# Patient Record
Sex: Female | Born: 1945
Health system: Southern US, Community
[De-identification: ages and names within clinical notes are randomized; demographics above are authoritative.]

## PROBLEM LIST (undated history)

## (undated) DIAGNOSIS — M8580 Other specified disorders of bone density and structure, unspecified site: Secondary | ICD-10-CM

## (undated) DIAGNOSIS — R05 Cough: Secondary | ICD-10-CM

## (undated) DIAGNOSIS — R011 Cardiac murmur, unspecified: Secondary | ICD-10-CM

## (undated) DIAGNOSIS — Z8639 Personal history of other endocrine, nutritional and metabolic disease: Secondary | ICD-10-CM

## (undated) DIAGNOSIS — R6883 Chills (without fever): Secondary | ICD-10-CM

## (undated) DIAGNOSIS — E785 Hyperlipidemia, unspecified: Secondary | ICD-10-CM

## (undated) DIAGNOSIS — K760 Fatty (change of) liver, not elsewhere classified: Secondary | ICD-10-CM

## (undated) DIAGNOSIS — D6861 Antiphospholipid syndrome: Principal | ICD-10-CM

## (undated) DIAGNOSIS — T4145XA Adverse effect of unspecified anesthetic, initial encounter: Secondary | ICD-10-CM

## (undated) DIAGNOSIS — I10 Essential (primary) hypertension: Secondary | ICD-10-CM

## (undated) DIAGNOSIS — I82409 Acute embolism and thrombosis of unspecified deep veins of unspecified lower extremity: Secondary | ICD-10-CM

## (undated) DIAGNOSIS — Z9889 Other specified postprocedural states: Secondary | ICD-10-CM

## (undated) DIAGNOSIS — R42 Dizziness and giddiness: Secondary | ICD-10-CM

## (undated) DIAGNOSIS — M1990 Unspecified osteoarthritis, unspecified site: Secondary | ICD-10-CM

## (undated) DIAGNOSIS — Z5189 Encounter for other specified aftercare: Secondary | ICD-10-CM

## (undated) DIAGNOSIS — R112 Nausea with vomiting, unspecified: Secondary | ICD-10-CM

## (undated) DIAGNOSIS — R059 Cough, unspecified: Secondary | ICD-10-CM

## (undated) DIAGNOSIS — T8859XA Other complications of anesthesia, initial encounter: Secondary | ICD-10-CM

## (undated) DIAGNOSIS — U071 COVID-19: Secondary | ICD-10-CM

## (undated) DIAGNOSIS — K219 Gastro-esophageal reflux disease without esophagitis: Secondary | ICD-10-CM

## (undated) DIAGNOSIS — J40 Bronchitis, not specified as acute or chronic: Secondary | ICD-10-CM

## (undated) DIAGNOSIS — E871 Hypo-osmolality and hyponatremia: Secondary | ICD-10-CM

## (undated) DIAGNOSIS — K449 Diaphragmatic hernia without obstruction or gangrene: Secondary | ICD-10-CM

## (undated) HISTORY — DX: Personal history of other endocrine, nutritional and metabolic disease: Z86.39

## (undated) HISTORY — DX: Hypo-osmolality and hyponatremia: E87.1

## (undated) HISTORY — DX: Diaphragmatic hernia without obstruction or gangrene: K44.9

## (undated) HISTORY — DX: Gastro-esophageal reflux disease without esophagitis: K21.9

## (undated) HISTORY — PX: REPAIR PERONEAL TENDONS ANKLE: SUR1201

## (undated) HISTORY — PX: CARPAL TUNNEL RELEASE: SHX101

## (undated) HISTORY — DX: Hyperlipidemia, unspecified: E78.5

## (undated) HISTORY — DX: Encounter for other specified aftercare: Z51.89

## (undated) HISTORY — DX: Chills (without fever): R68.83

## (undated) HISTORY — DX: Acute embolism and thrombosis of unspecified deep veins of unspecified lower extremity: I82.409

## (undated) HISTORY — DX: Cough: R05

## (undated) HISTORY — PX: TUBAL LIGATION: SHX77

## (undated) HISTORY — PX: ABDOMINAL HYSTERECTOMY: SHX81

## (undated) HISTORY — PX: KNEE SURGERY: SHX244

## (undated) HISTORY — DX: Cardiac murmur, unspecified: R01.1

## (undated) HISTORY — DX: Unspecified osteoarthritis, unspecified site: M19.90

## (undated) HISTORY — DX: Dizziness and giddiness: R42

## (undated) HISTORY — DX: Fatty (change of) liver, not elsewhere classified: K76.0

## (undated) HISTORY — PX: ACHILLES TENDON SURGERY: SHX542

## (undated) HISTORY — PX: TUMOR REMOVAL: SHX12

## (undated) HISTORY — PX: DILATION AND CURETTAGE OF UTERUS: SHX78

## (undated) HISTORY — DX: Other specified disorders of bone density and structure, unspecified site: M85.80

## (undated) HISTORY — DX: Essential (primary) hypertension: I10

## (undated) HISTORY — DX: Cough, unspecified: R05.9

## (undated) HISTORY — DX: Antiphospholipid syndrome: D68.61

---

## 1967-08-14 HISTORY — PX: CHOLECYSTECTOMY: SHX55

## 1999-10-17 ENCOUNTER — Other Ambulatory Visit: Admission: RE | Admit: 1999-10-17 | Discharge: 1999-10-17 | Payer: Self-pay | Admitting: Obstetrics and Gynecology

## 2000-05-10 ENCOUNTER — Encounter: Payer: Self-pay | Admitting: *Deleted

## 2000-05-10 ENCOUNTER — Encounter: Admission: RE | Admit: 2000-05-10 | Discharge: 2000-05-10 | Payer: Self-pay | Admitting: *Deleted

## 2000-10-17 ENCOUNTER — Encounter: Payer: Self-pay | Admitting: Orthopedic Surgery

## 2000-10-17 ENCOUNTER — Encounter: Admission: RE | Admit: 2000-10-17 | Discharge: 2000-10-17 | Payer: Self-pay | Admitting: Orthopedic Surgery

## 2001-12-05 ENCOUNTER — Encounter: Payer: Self-pay | Admitting: Obstetrics and Gynecology

## 2001-12-05 ENCOUNTER — Ambulatory Visit (HOSPITAL_COMMUNITY): Admission: RE | Admit: 2001-12-05 | Discharge: 2001-12-05 | Payer: Self-pay | Admitting: Obstetrics and Gynecology

## 2001-12-22 ENCOUNTER — Encounter: Payer: Self-pay | Admitting: Obstetrics and Gynecology

## 2001-12-24 ENCOUNTER — Ambulatory Visit (HOSPITAL_COMMUNITY): Admission: RE | Admit: 2001-12-24 | Discharge: 2001-12-24 | Payer: Self-pay | Admitting: Obstetrics and Gynecology

## 2002-05-15 ENCOUNTER — Encounter: Payer: Self-pay | Admitting: Orthopedic Surgery

## 2002-05-15 ENCOUNTER — Encounter: Admission: RE | Admit: 2002-05-15 | Discharge: 2002-05-15 | Payer: Self-pay | Admitting: Orthopedic Surgery

## 2002-08-13 HISTORY — PX: JOINT REPLACEMENT: SHX530

## 2003-01-25 ENCOUNTER — Encounter: Payer: Self-pay | Admitting: Orthopedic Surgery

## 2003-01-27 ENCOUNTER — Inpatient Hospital Stay (HOSPITAL_COMMUNITY): Admission: RE | Admit: 2003-01-27 | Discharge: 2003-01-31 | Payer: Self-pay | Admitting: Orthopedic Surgery

## 2003-01-27 ENCOUNTER — Encounter: Payer: Self-pay | Admitting: Orthopedic Surgery

## 2005-04-20 ENCOUNTER — Encounter: Admission: RE | Admit: 2005-04-20 | Discharge: 2005-07-19 | Payer: Self-pay | Admitting: Family Medicine

## 2005-04-29 ENCOUNTER — Encounter: Admission: RE | Admit: 2005-04-29 | Discharge: 2005-04-29 | Payer: Self-pay | Admitting: Orthopedic Surgery

## 2005-06-07 ENCOUNTER — Encounter: Admission: RE | Admit: 2005-06-07 | Discharge: 2005-08-15 | Payer: Self-pay | Admitting: Neurosurgery

## 2007-04-25 ENCOUNTER — Ambulatory Visit (HOSPITAL_BASED_OUTPATIENT_CLINIC_OR_DEPARTMENT_OTHER): Admission: RE | Admit: 2007-04-25 | Discharge: 2007-04-25 | Payer: Self-pay | Admitting: Orthopedic Surgery

## 2007-06-03 ENCOUNTER — Encounter: Payer: Self-pay | Admitting: Pulmonary Disease

## 2007-09-24 ENCOUNTER — Ambulatory Visit: Payer: Self-pay | Admitting: Pulmonary Disease

## 2007-09-24 DIAGNOSIS — I10 Essential (primary) hypertension: Secondary | ICD-10-CM | POA: Insufficient documentation

## 2007-10-07 ENCOUNTER — Ambulatory Visit: Payer: Self-pay | Admitting: Pulmonary Disease

## 2007-11-07 LAB — HM COLONOSCOPY

## 2008-03-19 ENCOUNTER — Encounter: Admission: RE | Admit: 2008-03-19 | Discharge: 2008-03-19 | Payer: Self-pay | Admitting: Orthopedic Surgery

## 2009-12-23 ENCOUNTER — Ambulatory Visit: Payer: Self-pay | Admitting: Diagnostic Radiology

## 2009-12-23 ENCOUNTER — Emergency Department (HOSPITAL_BASED_OUTPATIENT_CLINIC_OR_DEPARTMENT_OTHER): Admission: EM | Admit: 2009-12-23 | Discharge: 2009-12-23 | Payer: Self-pay | Admitting: Emergency Medicine

## 2010-02-14 ENCOUNTER — Ambulatory Visit (HOSPITAL_COMMUNITY): Admission: RE | Admit: 2010-02-14 | Discharge: 2010-02-14 | Payer: Self-pay | Admitting: Gastroenterology

## 2010-02-20 ENCOUNTER — Ambulatory Visit (HOSPITAL_COMMUNITY): Admission: RE | Admit: 2010-02-20 | Discharge: 2010-02-20 | Payer: Self-pay | Admitting: Gastroenterology

## 2010-02-22 ENCOUNTER — Ambulatory Visit (HOSPITAL_COMMUNITY): Admission: RE | Admit: 2010-02-22 | Discharge: 2010-02-22 | Payer: Self-pay | Admitting: Gastroenterology

## 2010-03-07 ENCOUNTER — Ambulatory Visit (HOSPITAL_COMMUNITY): Admission: RE | Admit: 2010-03-07 | Discharge: 2010-03-07 | Payer: Self-pay | Admitting: Gastroenterology

## 2010-06-28 ENCOUNTER — Encounter: Admission: RE | Admit: 2010-06-28 | Discharge: 2010-06-28 | Payer: Self-pay | Admitting: Family Medicine

## 2010-09-03 ENCOUNTER — Encounter: Payer: Self-pay | Admitting: Orthopedic Surgery

## 2010-10-31 LAB — URINALYSIS, ROUTINE W REFLEX MICROSCOPIC
Bilirubin Urine: NEGATIVE
Ketones, ur: NEGATIVE mg/dL
Nitrite: NEGATIVE
Protein, ur: NEGATIVE mg/dL
Urobilinogen, UA: 0.2 mg/dL (ref 0.0–1.0)

## 2010-10-31 LAB — URINE CULTURE

## 2010-12-26 NOTE — Op Note (Signed)
NAMESYDNI, [name]               ACCOUNT NO.:  [id number]   MEDICAL RECORD NO.:  [id number]          PATIENT TYPE:  AMB   LOCATION:  [place]                          FACILITY:  [facility]   PHYSICIAN:  Youlanda Mighty. Sypher, M.D. DATE OF BIRTH:  [birth year]   DATE OF PROCEDURE:  04/25/2007  DATE OF DISCHARGE:                               OPERATIVE REPORT   PREOPERATIVE DIAGNOSES:  Tenosynovitis right long finger.   POSTOPERATIVE DIAGNOSES:  Tenosynovitis right long finger.   OPERATION:  Release of right long finger A1 pulley.   OPERATIONS:  Karen Sato, MD.   ASSISTANT:  Karen Humbles, PA-C.   ANESTHESIA:  Local 2% lidocaine block of right long finger and flexor  sheath supplemented by IV sedation.   SUPERVISING ANESTHESIOLOGIST:  Dr. Ola Spurr.   INDICATIONS:  Karen Gonzales is a 65 year old woman referred for  evaluation and management of locking of her right long finger at the A1  pulley.  Due to failed response and nonoperative measures, she is  brought to the operating room at this time for release of right long  finger A1 pulley.   DESCRIPTION OF PROCEDURE:  [name] is brought to the operating  room and placed in supine position on the table.   Following light sedation, the right arm was prepped with Betadine soap  solution and sterilely draped.  2% lidocaine was infiltrated into the  path of the intended incision in the palm and into the right long finger  flexor sheath.   When anesthesia was satisfactory, the right arm was exsanguinated with  an Esmarch bandage and an arterial tourniquet on the proximal brachium  inflated to 250 mmHg.   The procedure commenced with a short oblique incision in the distal  palmar crease.  The subcutaneous tissue was carefully divided revealing  the palmar fascia.  This was released followed by identification of the  A1 pulley.  The A1 pulley was clearly noted to be covered in fibrinous  exudate.  This was clear to the [facility] elevator  followed by release of  the pulley along its radial border with tenotomy scissors.   Thereafter, [name] demonstrated full active range of motion,  closing the fingertip to the palm without triggering.   The wound was then repaired with interrupted suture of 5-0 nylon.   There were no apparent complications.   Her wound was dressed with Xeroflo, sterile gauze and Coban.   We will see her back for follow-up in 1 week for suture removal.   For aftercare she is provided a prescription of Darvocet-N 100 one  tablet p.o. q. 4-6 hours p.r.n. pain, 20 tablets without refill.      Youlanda Mighty Sypher, M.D.  Electronically Signed     RVS/MEDQ  D:  04/25/2007  T:  04/25/2007  Job:  [id number]

## 2010-12-29 NOTE — H&P (Signed)
NAME:  Karen Gonzales, Karen Gonzales NO.:  [id number]   MEDICAL RECORD NO.:  [id number]                   PATIENT TYPE:  INP   LOCATION:                                       FACILITY:  [place]   PHYSICIAN:  Lockie Pares, M.D.                 DATE OF BIRTH:  06-10-1946   DATE OF ADMISSION:  01/27/2003  DATE OF DISCHARGE:  01/31/2003                                HISTORY & PHYSICAL   CHIEF COMPLAINT:  Progressive left knee pain for the last three to four  years.   HISTORY OF PRESENT ILLNESS:  This 65 year old white female patient presented  to Dr. French Ana with a history of intermittent knee problems over the last  three to four years. She has a history of a right knee arthroscopy by him in  January of 1994 and then again in May of 1999. The left knee started  bothering her about 1996, and she subsequently has had a left knee  arthroscopy with a lateral release in August of 1996 and then two left knee  arthroscopies, one on September of 2002 and then one again in November of  2003. She has the gradual onset of progressively worsening left knee pain,  and it has not been getting any better. No other injury to her knee.   The pain at this point is constant in her left knee and is described as a  sharp throbbing sensation diffuse about the joint line with occasional  radiation approximately into her hip and distally into her calf. The pain  increases with any prolonged sitting or standing and then decreases with the  use of ice. She has been given some Percocet to use at night time, and she  takes half a tablet at night just to help her sleep. The knee pops, grinds,  gives way, and swells at times. It does keep her up at night. She has had  cortisone injections and Synvisc injections in the past with little relief.  She is not currently ambulating with any assistive devices.   ALLERGIES:  No known drug allergies.   CURRENT MEDICATIONS:  1. Detrol LA  mg one tablet  p.o. q.a.m.  2. Gynodiol 1.5 mg one tablet p.o. q.a.m.  3. Celebrex 200 mg one tablet p.o. q.a.m.  4. Clarinex 5 mg one tablet p.o. q.a.m.  5. Percocet 5/325 mg half tablet p.o. q.h.s. p.r.n. pain.   PAST MEDICAL HISTORY:  1. Benign heart murmur.  2. Seasonal allergies.  3. Urinary frequency and urgency treated by Dr. Rosana Hoes.   She denies any history of diabetes mellitus, hypertension, thyroid disease,  hiatal hernia, reflux, peptic ulcer disease, heart disease, asthma, or any  other chronic medical condition other than noted previously.   PAST SURGICAL HISTORY:  1. Open cholecystectomy 1968.  2. Right carpal tunnel release and right knee arthroscopy August 31, 1992  by Dr. Vangie Bicker.  3. Excision of a bone spur on her right pelvis in the 1970s.  4. Excision right heel pump bump by Dr. Amalia Hailey.  5. Left knee arthroscopy with lateral release April 05, 1995.  6. Right knee arthroscopy Dec 17, 1997.  7. Left carpal tunnel release September 16, 1996 by Dr. Viona Gilmore. Earlie Server.  8. Left knee arthroscopy May 07, 2001 by Dr. Viona Gilmore. Earlie Server.  9. Left knee arthroscopy June 24, 2002 by Dr. Viona Gilmore. Earlie Server.  10.      Laparoscopy for scar tissue around her ovaries by Dr. Willis Modena in     2003.  11.      Partial hysterectomy by Dr. Joneen Caraway in the 1980s.   She denies any complications from the above mentioned procedures.   SOCIAL HISTORY:  She denies any history of cigarette smoking, alcohol use,  or drug use. She is married and has three children. She lives with her  husband and her father in a one-story house with two steps into the main  entrance. 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 Her medical doctor is Dr. Hulan Fess in [facility], and he has cleared her for surgery.   FAMILY HISTORY:  Her mother died at the age of 23 with hypertension and  kidney failure. Her father is alive at age  64 with BPH and kidney stones.  She has two brothers, one age 68 with diabetes and emphysema, one age 62 who  is alive and well. Her two sons are age 15 and 37, the youngest with kidney  stones, the oldest with high cholesterol; and her daughter is 71 and just  has knee problems.   REVIEW OF SYSTEMS:  She does have a full plate of dentures on the upper jaw  line. She wears contact lenses. She does have occasional vertigo which she  treats with meclizine. She is currently having some sinus congestion and  occasional epistaxis, and she is using the Clarinex and the nasal spray. She  does have a history of acute bronchitis in the past, none recently. She has  frequent urinary frequency and urgency which is treated with the Detrol. She  does have some arthritic complaints in her hands. She does not have a living  will or power of attorney. All other systems are negative and  noncontributory.   PHYSICAL EXAMINATION:  GENERAL:  Well-developed, well-nourished, mildly  overweight white female in no acute distress. Walks with a slight limp. Mood  and affect are appropriate. Talks easily with examiner. Height 5 feet 5  inches, weight 178 pounds. BMI is 28.5.  VITAL SIGNS:  Temperature 96.6 degrees Fahrenheit, pulse 72, respirations  14, and BP 150/82.  HEENT:  Normocephalic, atraumatic without frontal or maxillary sinus  tenderness to palpation. Conjunctivae pink. Sclerae are anicteric. PERRLA.  EOMs intact. No visible external ear deformities. Hearing grossly intact.  Tympanic membranes pearly gray bilaterally with good light reflex. Nose and  nasal septal midline. Nasal mucosa pink and moist without exudates or polyps  noted. Buccal mucosa pink and moist. Dentures in place on the upper jaw  line. The rest of her dentition in good repair. Pharynx without erythema or exudate. Tongue and uvula midline. Tongue without fasciculations, and uvula  rises equally with phonation.  NECK:  No visible  masses or lesions noted. Trachea midline. No palpable  lymphadenopathy nor thyromegaly. Carotids +2 bilaterally without bruits.  Full range of motion. Nontender to palpation along the cervical spine.  CARDIOVASCULAR:  Heart rate and rhythm regular. S1 and S2 present without  rubs, clicks, or murmurs noted.  RESPIRATORY:  Respirations even and unlabored. Breath sounds clear to  auscultation bilaterally without rales or wheezes noted.  ABDOMEN:  Rounded abdominal contour. Bowel sounds present x4 quadrants.  Soft, nontender to palpation without hepatosplenomegaly or CVA tenderness.  Femoral pulses +2 bilaterally. Nontender to palpation along the entire  length of the vertebral column.  BREAST/GU/RECTAL/PELVIC:  These exams deferred at this time.  MUSCULOSKELETAL:  No obvious deformities bilateral upper extremities with  full range of motion of these extremities without pain. Radial pulses +2  bilaterally. She has full range of motion of her hips, ankles, and toes  bilaterally. DP and PT pulses are +2. Mild +1 pitting edema bilateral lower  extremities. No calf pain with palpation and negative Homans sign  bilaterally. Right knee has full extension and flexion to 135 degrees with  minimal crepitus. She does have some pain with palpation along the lateral  joint line medially. No effusion. She is stable to varus and valgus stress  and negative anterior drawer. She does have scattered spider veins noted  about both lower extremities. Left knee has full extension and flexion only  to 115 degrees. There is a moderate amount of crepitus with range of motion.  She is painful with palpation on both the medial and lateral joint line, but  medial seems to be more painful than lateral. There is a +2 effusion in the  knee and again spider veins noted scattered throughout the leg. She is  stable to varus and valgus stress and negative anterior drawer.  NEUROLOGICAL:  Alert and oriented x3. Cranial nerves  II-XII are grossly  intact. Strength 5/5 bilateral upper and lower extremities. Rapid  alternating movements intact. Deep tendon reflexes 2+ bilateral upper and  lower extremities.   RADIOLOGIC FINDINGS:  Arthroscopic pictures and Dr. Alvester Morin interpretation  shows tricompartmental osteoarthritis on the posterolateral plane. She did  have a meniscus and has generalized grade III chondral changes in her knee  with more advanced changes noted laterally.   IMPRESSION:  1. End-stage osteoarthritis, left knee.  2. Benign heart murmur.  3. Seasonal allergies.  4. Bladder frequency and urgency.   PLAN:  Ms. Seltzer will be admitted to [facility] on January 27, 2003  where she will undergo a left total knee arthroplasty by Dr. Vangie Bicker. She will undergo the routine preoperative laboratory tests and studies prior to this procedure. If we have any medical issues while she is  hospitalized, we will consult [facility] hospitalist.     Vonita Moss. Reggy Eye, M.D.    KED/MEDQ  D:  01/14/2003  T:  01/14/2003  Job:  [id number]

## 2010-12-29 NOTE — Op Note (Signed)
NAME:  Karen Gonzales, Karen Gonzales                         ACCOUNT NO.:  [id number]   MEDICAL RECORD NO.:  [id number]                   PATIENT TYPE:  INP   LOCATION:  2899                                 FACILITY:  [place]   PHYSICIAN:  Yvette Rack., M.D.             DATE OF BIRTH:  1945-08-30   DATE OF PROCEDURE:  01/27/2003  DATE OF DISCHARGE:                                 OPERATIVE REPORT   PREOPERATIVE DIAGNOSIS:  Osteoarthritis with varus deformity, left knee.   POSTOPERATIVE DIAGNOSIS:  Osteoarthritis with varus deformity, left knee.   OPERATION PERFORMED:  Left total knee replacement (LCS standard femur,  patella, size 3 tibia with 17.5 mm bearing).   SURGEON:  Lockie Pares, M.D.   ASSISTANT:  Thomasenia Bottoms, P.A.-C.   TOURNIQUET TIME:  One hour, 30 minutes.   DESCRIPTION OF PROCEDURE:  Sterile prep and drape.  Exsanguination of leg.  Inflation of tourniquet to 375 mmHg.  Straight skin incision with medial  parapatellar approach to the made.  A tibial cut after placement of a tibial  cut jig about 2 degrees below the most diseased medial compartment.  This  was followed by cutting the anterior-posterior femoral cut and balancing of  the knee which required actually eventually going up to a 17.5 mm bearing  with stripping of the medial side of the knee.   Distal femoral cut was accomplished 4 degrees valgus, followed by anterior  and posterior chamfers.  __________  for the prosthesis.  The tibia was next  delivered, anteriorly.  The PCL was released.  Excess remnants of the  menisci were removed.  A size 3 tibia was placed at the keel type tibia size  3 as mentioned.  Trial reduction carried out on femur tibia.  Patella was  cut leaving 14 mm of ___________  patella.  Trial reduction showed mild,  medial laxity and lateral laxity with a 15 bearing, therefore it was upped  to a 17.5 mm bearing.  After this, the components were removed.  The final  components cemented in with  the exception of the bearing.  The bearing again  judged to be acceptable at 17.5 mm.  The tourniquet was released and checked  for bleeding and excess cement along the posterior aspect of the femur which  was not noted.  Again final reduction was carried out with a final bearing  of the above mentioned sizes.  Closure was effected.  Small bleeders were  coagulated.  Closure was effected with #1 Ethibond, 2-0 Vicryl and skin  clips and then anesthesia was coming in to do a femoral nerve block.                                                Lockie Pares  Brooke Bonito., M.D.    WDC/MEDQ  D:  01/27/2003  T:  01/27/2003  Job:  [id number]

## 2010-12-29 NOTE — Discharge Summary (Signed)
NAME:  Karen Gonzales, Karen Gonzales                         ACCOUNT NO.:  [id number]   MEDICAL RECORD NO.:  [id number]                   PATIENT TYPE:  INP   LOCATION:  5027                                 FACILITY:  [place]   PHYSICIAN:  Lockie Pares, M.D.                 DATE OF BIRTH:  12/24/1945   DATE OF ADMISSION:  01/27/2003  DATE OF DISCHARGE:  01/31/2003                                 DISCHARGE SUMMARY   ADMISSION DIAGNOSES:  1. End-stage osteoarthritis, left knee.  2. Heart murmur.  3. Seasonal allergies.  4. Urinary frequency and urgency.   DISCHARGE DIAGNOSES:  1. Left total knee arthroplasty.  2. Postoperative blood loss anemia.  3. Heart murmur.  4. Seasonal allergies.  5. Urinary frequency and urgency.   HISTORY OF PRESENT ILLNESS:  The patient is a 65 year old, white female, who  presents with a 3-4 year history of gradual onset but progressively  worsening left knee pain.  She denies any specific injury.  She has history  of left knee arthroscopy in 1996, 2002, and 2003.  The pain is currently  constant, sharp, throbbing sensation diffuse about the knee with radiation  up into the hip and down into the calf at times with prolonged sitting and  standing.  It improves with elevation and ice.  She is currently using  Percocet for her pain.  She does have night pain, popping, grinding, and  giving-away.  It does swell.   ALLERGIES:  No known drug allergies.   CURRENT MEDICATIONS:  1. Detrol LA 4 mg p.o. q.a.m.  2. ________ 1.5 mg p.o. q.a.m.  3. Celebrex 200 mg p.o. q.a.m.  4. Clarinex 5 mg p.o. q.a.m.  5. Percocet p.r.n.   SURGICAL PROCEDURE:  On January 27, 2003, patient was taken to the OR by Lockie Pares, M.D., assisted by Erskine Emery, P.A.-C., under general anesthesia.  The patient underwent a left total knee replacement with an LCS standard  femur and patella and a size 3 tibial tray with a 17.5 polyethylene bearing.  The patient tolerated the procedure well.   There were no complications.  The  components were cemented in, and patient received a postoperative femoral  nerve block prior to leaving the operative suite.  The patient was  discharged to the recovery room in good condition.   CONSULTS:  The following routine consults were requested:  Physical therapy,  occupational therapy, case management.   HOSPITAL COURSE:  On January 27, 2003, patient was admitted to [facility] under the care of Lockie Pares, M.D.  The patient was taken to the  OR where a left total knee arthroplasty was performed without any  complications.  The patient received a postoperative femoral nerve block to  assist in pain control, and the patient was transferred to the recovery room  and then to the orthopedic floor in good condition.  The patient was placed  on Coumadin for routine DVT prophylaxis for a 30 day  protocol to be  monitored as an outpatient by [place].   The patient then incurred a total of four days of postoperative care on the  orthopedic floor in which the patient did develop some postoperative blood  loss anemia.  Her hemoglobin dropped to 8.9 with her heart rate elevating to  around 100.  Her blood pressure with the systolics in the 086'V/78.  Patient  did have some lightheadedness and dizziness, so she was typed and crossed  and transfused two units of packed red blood cells with good expected  improvement of her hemoglobin to 10.2 and no other untoward events.  The  patient did have some nausea on postop day #1, but this resolved on its own  with time; otherwise, she had no other problems during her hospitalization.   The patient did have an occasional expected low-grade temp. but otherwise  remained afebrile.  Her wound remained benign for any signs of infection.  Her leg remained neuro-motor-vascularly intact.  The patient worked well  with the [place].  She was able to achieve 0-90 degrees without difficulty.  She  worked well with  physical therapy, was able to achieve her goals in order to  be discharged home by postop day #4, so she was discharged home with routine  home health physical therapy per protocol.   LABORATORY DATA:  CBC on June 19:  WBC 7.4, hemoglobin 10.2, hematocrit  29.7, platelets 165.   EKG on admission was normal sinus rhythm with 84 beats per minute.   INR on June 20 was 1.5.  Routine chemistries on June 18:  Sodium of 134 up  from a low of 131, potassium 3.9, glucose 118 down from a high of 134, BUN  5, creatinine 0.5.  Routine urinalysis on admission was normal.  The patient  received a total of two units of packed red blood cells during her  hospitalization.   MEDICATIONS UPON DISCHARGE FROM ORTHOPEDIC FLOOR:  1. Colace 100 mg p.o. b.i.d.  2. Senokot 1 tab p.o. b.i.d.  3. Laxative or enema of choice p.r.n.  4. Reglan 10 mg p.o. q.8h. p.r.n.  5. Percocet 1-2 tabs q.4-6h. p.r.n.  6. Tylenol 650 mg p.o. q.6h. p.r.n.  7. Robaxin 500 mg p.o. q.6h. p.r.n.  8. Restoril 15 mg p.o. q.h.s. p.r.n.  9. Heparin 3000 units subcu q.12h. until Coumadin therapeutic.  10.      Claritin 10 mg p.o. daily.  11.      Vioxx 12.5 mg p.o. daily.  12.      Detrol 4 mg p.o. daily.  13.      Estradiol 1 mg p.o. daily.  14.      Coumadin 7.5 mg p.o. daily.  15.      The patient is to resume routine home medications of Percocet 5 mg     1-2 tabs q.4-6h. for pain p.r.n.  16.      Coumadin 5 mg 1 tab or 1/2 tab 7.5 mg daily unless changed by     [place].   ACTIVITY:  The patient is to maintain 50% of weightbearing on left leg with  the use of a walker.   DIET:  No restrictions.   WOUND CARE:  Patient is to keep wound clean and dry; to check daily for any  signs of infection.  If any questions, call Lockie Pares, M.D.'s office for advice.  FOLLOW-UP:  The patient is to call [phone] for a follow-up in one week with  Lockie Pares, M.D.  PATIENT'S CONDITION UPON DISCHARGE TO HOME:  Improved and  good.     Evert Kohl, Alisa Graff, M.D.    RWK/MEDQ  D:  02/18/2003  T:  02/19/2003  Job:  [id number]   cc:   Lennette Bihari L. Little, M.D.  [address]  [place]  [place] [address]  Fax: [phone]    cc:   Priscille Heidelberg. Little, M.D.  [address]  [place]  [place] [address]  Fax: [phone]

## 2010-12-29 NOTE — H&P (Signed)
[facility]  Patient:    Karen Gonzales, Karen Gonzales Visit Number: [id number] MRN: [id number]          Service Type: DSU Location: DAY Attending Physician:  Arta Silence Dictated by:   Clarene Duke, M.D. Admit Date:  12/24/2001                           History and Physical  CHIEF COMPLAINT:  Left pelvic pain.  HISTORY OF PRESENT ILLNESS:  This is a 65 year old white female, para 3-0-0-3, who was seen for her annual exam in April of this year.  She complained of left lower quadrant pain off and on without specific aggravating or relieving factors.  Some days the pain is worse than others, but no specific associated factors.  She had no significant findings at exam, except her left adnexa was slightly tender.  Due to the fact that the patient is gradually getting, the patient wished to proceed with laparoscopic surgery to investigate the cause of this pain.  PAST OBSTETRICAL HISTORY:  Three vaginal deliveries without complications.  PAST MEDICAL HISTORY: 1. Knee arthritis. 2. Detrusor instability.  PAST SURGICAL HISTORY: 1. Total abdominal hysterectomy. 2. Prior tubal ligation. 3. Cholecystectomy. 4. Knee surgery x 4. 5. Pelvic bone surgery. 6. Surgery for carpal tunnel syndrome.  ALLERGIES:  None known.  CURRENT MEDICATIONS: 1. Detrol LA 4 mg a day. 2. Estradiol 1.5 mg a day. 3. Clarinex p.r.n.  SOCIAL HISTORY:  The patient is married and denies alcohol, tobacco, or drug use.  REVIEW OF SYSTEMS:  Otherwise negative.  FAMILY HISTORY:  Paternal grandmother and paternal aunt with breast cancer.  PHYSICAL EXAMINATION:  Generally this is a well-developed, well-nourished, white female who is in no acute distress.  WEIGHT:  184 pounds.  VITAL SIGNS:  The blood pressure is 130/90.  NECK:  Supple without lymphadenopathy or thyromegaly.  LUNGS:  Clear to auscultation.  HEART:  Regular rate and rhythm without murmur.  BREASTS:   Examined in the sitting and supine position reveal no dominant masses, adenopathy, skin change, or nipple discharge.  ABDOMEN:  Normal with right upper quadrant and transverse scars.  No palpable masses or tenderness.  PELVIC:  The external genitalia are within normal limits.  The speculum exam reveals a normal healed vaginal cuff.  On bimanual exam, she has no adnexal masses, but is slightly tender on the left.  This is confirmed by rectovaginal exam.  ASSESSMENT:  Left pelvic pain, status post total abdominal hysterectomy.  The pain is gradually getting worse and the patient wishes to proceed with surgical therapy.  The risks of the surgery, including bleeding, infection, and damage to surrounding organs have been discussed with the patient.  PLAN:  Admit the patient for diagnostic laparoscopy with possible lysis of adhesions. Dictated by:   Clarene Duke, M.D. Attending Physician:  Arta Silence DD:  12/23/01 TD:  12/24/01 Job: [id number]

## 2010-12-29 NOTE — Op Note (Signed)
[facility]  Patient:    Karen Gonzales, Karen Gonzales Visit Number: [id number] MRN: [id number]          Service Type: DSU Location: DAY Attending Physician:  Arta Silence Dictated by:   Clarene Duke, M.D. Proc. Date: 12/24/01 Admit Date:  12/24/2001                             Operative Report  PREOPERATIVE DIAGNOSIS:  Pelvic pain.  POSTOPERATIVE DIAGNOSIS:  Pelvic pain and pelvic adhesions.  PROCEDURE:  Laparoscopic adhesiolysis.  SURGEON:  Clarene Duke, M.D.  ANESTHESIA:  General endotracheal tube.  ESTIMATED BLOOD LOSS:  Less than 50 cc.  FINDINGS:  Normal tubes and ovaries except that they were both adherent laterally.  The uterus was absent from prior hysterectomy.  Omentum was adherent to most of the pelvis and the left inguinal canal.  It was also adherent beneath the umbilicus and just inferior to the liver to the abdominal pain from prior cholecystectomy.  DESCRIPTION OF PROCEDURE:  The patient was taken to the operating room and placed in the dorsal supine position.  General anesthesia was induced and she was placed in mobile stirrups.  The abdomen was prepped and draped in the usual sterile fashion for a laparoscopic procedure.  The bladder was drained with a red rubber catheter and a sponge stick was placed at the vaginal cuff for manipulation.  Her infraumbilical skin was infiltrated with 0.25% Marcaine and a 1.5 cm horizontal incision was made in her previous scar.  The Veress needle was inserted into the peritoneum cavity and placement confirmed by a water drop test and an opening pressure of 4 mmHg.  Approximately 3 L of CO2 gas was insufflated and the Veress needle was removed.  A 10-11 disposable trocar was introduced and placement confirmed by the laparoscope.  There were noticed to be adhesions just beneath the umbilicus, but the trocar had avoided these just laterally.  Some of these were taken down with blunt  dissection. Inspection of the pelvis revealed the above mentioned findings.  A 5 mm port was placed low in the midline under direct visualization.  Using monopolar scissors, the adhesions of the omentum to the pelvis were taken down sharply with cautery.  The right ovary was freed from the lateral side wall which sharp dissection and monopolar cautery.  A small amount of bleeding was taken care of with bipolar cautery.  Left tube and ovary were also freed up sharply from the left pelvic wall, again with scissors and monopolar cautery.  The omentum adherent to the left inguinal canal was also taken down with monopolar cautery.  This achieved adequate reduction of all the adhesions.  There was no other evident source of pelvic pain.  All sites were hemostatic.  The 5 mm trocar was removed.  Some of the umbilical incisions were taken down bluntly, but there were some umbilical adhesions left.  The gas was allowed to deflate from the abdomen and the trocar was removed.  The skin incisions were closed with interrupted subcuticular sutures of 4-0 Vicryl followed by Steri-Strips and band-aids.  The sponge stick was removed from her vagina.  The patient was awakened in the operating room and tolerated the procedure well, and was taken to the recovery room in stable condition. Dictated by:   Clarene Duke, M.D. Attending Physician:  Arta Silence DD:  12/24/01 TD:  12/25/01 Job: [id number]

## 2011-05-25 LAB — BASIC METABOLIC PANEL
CO2: 26
Calcium: 9.3
GFR calc non Af Amer: >60
Sodium: 132 — ABNORMAL LOW

## 2011-05-25 LAB — POCT HEMOGLOBIN-HEMACUE
Hemoglobin: 13.3
Operator id: 208731

## 2011-05-28 ENCOUNTER — Ambulatory Visit (INDEPENDENT_AMBULATORY_CARE_PROVIDER_SITE_OTHER): Payer: Medicare Other | Admitting: Surgery

## 2011-05-28 ENCOUNTER — Encounter (INDEPENDENT_AMBULATORY_CARE_PROVIDER_SITE_OTHER): Payer: Self-pay | Admitting: Surgery

## 2011-05-28 DIAGNOSIS — R220 Localized swelling, mass and lump, head: Secondary | ICD-10-CM

## 2011-05-28 DIAGNOSIS — R229 Localized swelling, mass and lump, unspecified: Secondary | ICD-10-CM

## 2011-05-28 NOTE — Progress Notes (Signed)
Subjective:     Patient ID: Karen Gonzales, female   DOB: 07-Nov-1945, 65 y.o.   MRN: [id number]  HPI  Patient Care Team: Gennette Pac as PCP - General (Family Medicine)  This patient is a 65 y.o.female who presents today for surgical evaluation.   Reason for visit: Enlarging mass on scalp. Right-sided headaches.  The patient notes she felt a lump on the top of her head about 2 years ago. It did not really bother her. However, it has gotten larger. Over the past year, she has noticed right-sided head pain. It is in the posterior vertex of her scalp, just posterior to the mass. No vision changes. No hearing changes. No history of ear infections. She has had some mild lower neck soreness. She has no symptoms on the left at all. These bouts of pain will come on a few times a week. Usually it goes away in the morning. Over-the-counter pain medications do not seem to help much no history of other skin lesions or sores. No family history of any masses  Past Medical History  Diagnosis Date  . Arthritis   . Blood transfusion   . Hyperlipidemia   . Hypertension   . GERD (gastroesophageal reflux disease)   . Heart murmur   . Diabetes mellitus   . Cough   . Chills     Past Surgical History  Procedure Date  . Joint replacement 2004    left knee replacement   . Knee surgery     total of 6 surgeries on both knees   . Carpal tunnel release     bilateral   . Achilles tendon surgery     bilateral   . Tumor removal     on pelvic bone   . Cholecystectomy 1969    open    History   Social History  . Marital Status: Married    Spouse Name: N/A    Number of Children: N/A  . Years of Education: N/A   Occupational History  . Not on file.   Social History Main Topics  . Smoking status: Never Smoker   . Smokeless tobacco: Never Used  . Alcohol Use: No  . Drug Use: No  . Sexually Active:    Other Topics Concern  . Not on file   Social History Narrative  . No narrative on  file    Family History  Problem Relation Age of Onset  . Kidney disease Mother   . Heart disease Father     Current outpatient prescriptions:amLODipine (NORVASC) 10 MG tablet, , Disp: , Rfl: ;  Cholecalciferol (VITAMIN D PO), Take by mouth daily.  , Disp: , Rfl: ;  cloNIDine (CATAPRES) 0.1 MG tablet, , Disp: , Rfl: ;  lisinopril (PRINIVIL,ZESTRIL) 20 MG tablet, , Disp: , Rfl: ;  metFORMIN (GLUCOPHAGE) 500 MG tablet, , Disp: , Rfl: ;  omeprazole (PRILOSEC) 20 MG capsule, Take 20 mg by mouth daily.  , Disp: , Rfl:  pravastatin (PRAVACHOL) 20 MG tablet, , Disp: , Rfl:   No Known Allergies     Review of Systems  Constitutional: Negative for fever, chills, diaphoresis, appetite change and fatigue.  HENT: Negative for hearing loss, ear pain, nosebleeds, congestion, sore throat, facial swelling, rhinorrhea, sneezing, mouth sores, trouble swallowing, neck stiffness, voice change, postnasal drip, sinus pressure, tinnitus and ear discharge.   Eyes: Negative for photophobia, discharge and visual disturbance.  Respiratory: Positive for cough. Negative for apnea, choking, chest tightness, shortness of breath,  wheezing and stridor.   Cardiovascular: Negative for chest pain and palpitations.  Gastrointestinal: Negative for nausea, vomiting, abdominal pain, diarrhea, constipation, blood in stool, anal bleeding and rectal pain.  Genitourinary: Negative for dysuria, frequency and difficulty urinating.  Musculoskeletal: Positive for arthralgias. Negative for myalgias and gait problem.  Skin: Negative for color change, pallor, rash and wound.  Neurological: Negative for dizziness, speech difficulty, weakness and numbness.  Hematological: Negative for adenopathy.  Psychiatric/Behavioral: Negative for confusion and agitation. The patient is not nervous/anxious.        Objective:   Physical Exam  Constitutional: She is oriented to person, place, and time. She appears well-developed and well-nourished. No  distress.  HENT:  Head: Normocephalic. Head is without raccoon's eyes, without abrasion and without contusion. Hair is normal.    Mouth/Throat: Oropharynx is clear and moist. No oropharyngeal exudate.  Eyes: Conjunctivae and EOM are normal. Pupils are equal, round, and reactive to light. No scleral icterus.  Neck: Normal range of motion. Neck supple. No tracheal deviation present.  Cardiovascular: Normal rate, regular rhythm and intact distal pulses.   Pulmonary/Chest: Effort normal and breath sounds normal. No respiratory distress. She exhibits no tenderness.  Abdominal: Soft. She exhibits no distension and no mass. There is no tenderness. Hernia confirmed negative in the right inguinal area and confirmed negative in the left inguinal area.  Genitourinary: No vaginal discharge found.  Musculoskeletal: Normal range of motion. She exhibits no tenderness.  Lymphadenopathy:    She has no cervical adenopathy.       Right: No inguinal adenopathy present.       Left: No inguinal adenopathy present.  Neurological: She is alert and oriented to person, place, and time. No cranial nerve deficit. She exhibits normal muscle tone. Coordination normal.  Skin: Skin is warm and dry. No rash noted. She is not diaphoretic. No erythema.  Psychiatric: She has a normal mood and affect. Her behavior is normal. Judgment and thought content normal.       Assessment:     Mass on vertex of scalp, enlarging with nearby pain     Plan:     The pathophysiology of skin & subcutaneous masses was discussed.  Natural history risks without surgery were discussed.  I recommended surgery to remove the mass.  I explained the technique of removal with use of local & general anesthesia for patient comfort.   Risks such as bleeding, infection, heart attack, death, and other risks were discussed.  Possibility that this will not correct all symptoms was explained. Possibility of regrowth/recurrence of the mass was discussed.   We will work to minimize complications. Questions were answered.  The patient expresses understanding & wishes to proceed with surgery.

## 2011-05-28 NOTE — Patient Instructions (Signed)
Sebaceous Cysts Sebaceous cysts are sometimes called epidermal cysts. They may come from injury to the skin or an infected hair follicle. The cyst usually contains a "pasty" or "cheesy" looking substance which has a bad smell. This is from the substance called keratin which fills sebaceous cysts. Keratin is a protein that creates the sac of cells called sebaceous cysts.  SYMPTOMS Sebaceous cysts are usually found on the face, neck, or trunk. They also may occur in the vaginal area or other parts of the genitalia of both women and men. Sebaceous cysts are usually painless, slow-growing small bumps or lumps that move freely under the skin. It's important not to try and pop them. This may cause an infection and lead to tenderness and swelling. Occasionally infections may occur. Signs or symptoms that may indicate infection of sebaceous cysts include:   Redness.   Tenderness.   Increased temperature of the skin over the bumps or lumps.   Grayish white, cheesy, foul smelling material draining from the bump or lump.  DIAGNOSIS Sebaceous cysts are easily diagnosed on exam by your caregiver. Rarely, a biopsy may be needed to rule out other conditions that may resemble sebaceous cysts.  TREATMENT  Sebaceous cysts often get better and disappear on their own. They are rarely ever cancerous.   Sometimes they may become inflamed and tender if they become infected. This may require opening and draining the cyst. Treatment with antibiotics (medications which kill germs) may be necessary. When the infection is gone, the cyst may be removed with minor surgery.   Small inflamed cysts can often be treated by injecting steroid medications or with antibiotics.   REMOVAL BY SURGERY  Sometimes sebaceous cysts become large and bothersome. If this happens, surgical removal in your caregiver's office may be necessary.  COMPLICATIONS  Sebaceous cysts may occasionally become infected and form into painful abscesses.     Even when the cyst is removed, sebaceous cyst recurrence in not unusual.   Consult your health care provider anytime you notice any type of growth, bump, or lump on your body.  SEEK MEDICAL CARE IF:  You develop any signs of infection.   Your condition is not improving, or is getting worse.   You have any other questions or concerns.  Document Released: 06/30/2004 Document Re-Released: 10/26/2008 [facility] Patient Information 2011 [place].

## 2011-06-04 ENCOUNTER — Encounter (HOSPITAL_COMMUNITY): Payer: Medicare Other

## 2011-06-04 ENCOUNTER — Other Ambulatory Visit (INDEPENDENT_AMBULATORY_CARE_PROVIDER_SITE_OTHER): Payer: Self-pay | Admitting: Surgery

## 2011-06-04 ENCOUNTER — Ambulatory Visit (HOSPITAL_COMMUNITY)
Admission: RE | Admit: 2011-06-04 | Discharge: 2011-06-04 | Disposition: A | Discharge status: Home / Self Care | Payer: Medicare Other | Source: Ambulatory Visit | Attending: Surgery | Admitting: Surgery

## 2011-06-04 DIAGNOSIS — Z01810 Encounter for preprocedural cardiovascular examination: Secondary | ICD-10-CM | POA: Insufficient documentation

## 2011-06-04 DIAGNOSIS — Z01812 Encounter for preprocedural laboratory examination: Secondary | ICD-10-CM | POA: Insufficient documentation

## 2011-06-04 DIAGNOSIS — R220 Localized swelling, mass and lump, head: Secondary | ICD-10-CM | POA: Insufficient documentation

## 2011-06-04 DIAGNOSIS — Z01818 Encounter for other preprocedural examination: Secondary | ICD-10-CM | POA: Insufficient documentation

## 2011-06-04 DIAGNOSIS — I10 Essential (primary) hypertension: Secondary | ICD-10-CM | POA: Insufficient documentation

## 2011-06-04 DIAGNOSIS — R9431 Abnormal electrocardiogram [ECG] [EKG]: Secondary | ICD-10-CM | POA: Insufficient documentation

## 2011-06-04 LAB — BASIC METABOLIC PANEL
BUN: 13 mg/dL (ref 6–23)
CO2: 30 mEq/L (ref 19–32)
Chloride: 100 mEq/L (ref 96–112)
GFR calc Af Amer: >90 mL/min (ref >90)
GFR calc non Af Amer: >90 mL/min (ref >90)
Sodium: 137 mEq/L (ref 135–145)

## 2011-06-04 LAB — SURGICAL PCR SCREEN: MRSA, PCR: NEGATIVE

## 2011-06-04 LAB — CBC
HCT: 39.2 % (ref 36.0–46.0)
Hemoglobin: 13.2 g/dL (ref 12.0–15.0)
MCHC: 33.7 g/dL (ref 30.0–36.0)
RBC: 4.59 MIL/uL (ref 3.87–5.11)
RDW: 12.9 % (ref 11.5–15.5)

## 2011-06-11 ENCOUNTER — Other Ambulatory Visit (HOSPITAL_COMMUNITY): Payer: Medicare Other

## 2011-06-14 ENCOUNTER — Ambulatory Visit (HOSPITAL_COMMUNITY)
Admission: RE | Admit: 2011-06-14 | Discharge: 2011-06-14 | Disposition: A | Discharge status: Home / Self Care | Payer: Medicare Other | Source: Ambulatory Visit | Attending: Surgery | Admitting: Surgery

## 2011-06-14 DIAGNOSIS — I10 Essential (primary) hypertension: Secondary | ICD-10-CM | POA: Insufficient documentation

## 2011-06-14 DIAGNOSIS — Z01810 Encounter for preprocedural cardiovascular examination: Secondary | ICD-10-CM | POA: Insufficient documentation

## 2011-06-14 DIAGNOSIS — R221 Localized swelling, mass and lump, neck: Secondary | ICD-10-CM

## 2011-06-14 DIAGNOSIS — Z01812 Encounter for preprocedural laboratory examination: Secondary | ICD-10-CM | POA: Insufficient documentation

## 2011-06-14 DIAGNOSIS — E785 Hyperlipidemia, unspecified: Secondary | ICD-10-CM | POA: Insufficient documentation

## 2011-06-14 DIAGNOSIS — R220 Localized swelling, mass and lump, head: Secondary | ICD-10-CM

## 2011-06-14 DIAGNOSIS — Z96659 Presence of unspecified artificial knee joint: Secondary | ICD-10-CM | POA: Insufficient documentation

## 2011-06-14 DIAGNOSIS — M542 Cervicalgia: Secondary | ICD-10-CM | POA: Insufficient documentation

## 2011-06-14 DIAGNOSIS — M129 Arthropathy, unspecified: Secondary | ICD-10-CM | POA: Insufficient documentation

## 2011-06-14 DIAGNOSIS — Z01818 Encounter for other preprocedural examination: Secondary | ICD-10-CM | POA: Insufficient documentation

## 2011-06-14 DIAGNOSIS — E119 Type 2 diabetes mellitus without complications: Secondary | ICD-10-CM | POA: Insufficient documentation

## 2011-06-14 DIAGNOSIS — R51 Headache: Secondary | ICD-10-CM | POA: Insufficient documentation

## 2011-06-14 DIAGNOSIS — M899 Disorder of bone, unspecified: Secondary | ICD-10-CM | POA: Insufficient documentation

## 2011-06-14 DIAGNOSIS — K219 Gastro-esophageal reflux disease without esophagitis: Secondary | ICD-10-CM | POA: Insufficient documentation

## 2011-06-14 LAB — GLUCOSE, CAPILLARY

## 2011-06-19 ENCOUNTER — Telehealth (INDEPENDENT_AMBULATORY_CARE_PROVIDER_SITE_OTHER): Payer: Self-pay

## 2011-06-19 DIAGNOSIS — M898X8 Other specified disorders of bone, other site: Secondary | ICD-10-CM

## 2011-06-19 NOTE — Telephone Encounter (Signed)
Pt notified of the CT scan appt I have scheduled for her on 06-20-11 @2 :30 for the [address] but Im still waiting to get the referral for Dr Christella Noa. The pt understands this info. I will call her back once I have that appt in place./ [facility]

## 2011-06-19 NOTE — Op Note (Signed)
Karen Gonzales, ANDREASON NO.:  [id number]  MEDICAL RECORD NO.:  [id number]  LOCATION:                               FACILITY:  [facility]  PHYSICIAN:  Adin Hector, MD     DATE OF BIRTH:  1945-09-17  DATE OF PROCEDURE:  06/14/2011 DATE OF DISCHARGE:                              OPERATIVE REPORT   PRIMARY CARE PHYSICIAN:  Lennette Bihari L. Little, M.D.  SURGEON:  Adin Hector, MD  ASSISTANT:  None.  NEUROSURGEON:  Ashok Pall, M.D. with [facility].  PREOPERATIVE DIAGNOSIS:  Mass at vertex of the scalp.  POSTOPERATIVE DIAGNOSIS:  Mass at the vertex of bony skull, 1 x 1.5 cm.  PROCEDURE PERFORMED:  Scalp exploration.  ANESTHESIA: 1. General anesthesia. 2. Local anesthetic in a field block.  SPECIMENS:  None.  DRAINS:  None.  ESTIMATED BLOOD LOSS:  Minimal.  COMPLICATIONS:  None apparent.  INDICATIONS:  Ms. Diliberto is a 65 year old female who saw a lump on her had about 2 years ago.  It was not particularly painful, but then she has had increased right-sided pain on the head and just posterior to the mass as well.  Most of this with some mild headaches, a little bit of neck soreness.  No vision or auditory changes or any other abnormalities.  It can be a firm mass, but felt within the subcutaneous tissues, but fixed to the scalp.  The pathophysiology of different diagnosis discussed.  Options discussed.  Recommendation made for scalp exploration with removal of the mass.  Risks, benefits, and alternatives discussed.  Questions answered.  She agreed to proceed.  OPERATIVE FINDINGS:  Her skin and subcutaneous tissues were normal.  She had a smooth, ellipsoid bony mass arising out of the vertex of bony scalp.  It was 1 x 1.5 cm in width and about 7 mm height.  No ulcerations.  I did not do any biopsies.  DESCRIPTION OF PROCEDURE:  Informed consent was confirmed.  The patient received IV antibiotics.  She underwent anesthesia without  any difficulty.  She was placed in a beach-chair position.  Her vertex of the scalp was shaved to about 4 x 4 cm and hair was kept away.  The scalp was prepped and draped in sterile fashion.  Surgical time-out confirmed our plan.  I felt a mass and marked out the plans for skin incision.  I did a field block.  I did use scalpel to get the subcutaneous tissues.  I did about 1.5 x 0.5 cm ellipsoid excision of the skin and subcutaneous tissues.  I could feel the mass, it was rather firm.  I extended the incision a little bit and came down.  Came quickly apparent to the mass, it was actually arising out of the bony scalp itself.  I did dissect that the scalp off the skull and I confirmed that this was a rising out of the skull bone itself.  I discussed with Dr. Christella Noa with  [facility].  He is over at [facility] in [place] [facility].  At this point, I did not feel it is reasonable to any major biopsy at this point.  I decided the  best thing to do is to close this and regroup with restaging.  Dr. Christella Noa recommended considering a CT of the head and then follow up with them for further follow up to see if this requires a more formal excision. However, we do not have that availability here over at [facility].  I, therefore, decided to abort the procedure and close.  I closed the wound using a 2-0 Vicryl deep dermal stitches and a running 4-0 Monocryl subcuticular stitch.  I did leave about 6 cm tails coming out of the wound to help replace.  I placed some triple antibiotic ointment.  She was extubated and taken to the recovery room in stable condition.  I will discuss postoperative findings with patient's family.     Adin Hector, MD     SCG/MEDQ  D:  06/14/2011  T:  06/15/2011  Job:  [id number]  cc:   Lennette Bihari L. Little, M.D. Fax: [phone]  Ashok Pall, M.D. Fax: [phone]  Electronically Signed by Michael Boston MD on 06/19/2011 08:57:08 PM

## 2011-06-20 ENCOUNTER — Ambulatory Visit
Admission: RE | Admit: 2011-06-20 | Discharge: 2011-06-20 | Disposition: A | Discharge status: Home / Self Care | Payer: Medicare Other | Source: Ambulatory Visit | Attending: Surgery | Admitting: Surgery

## 2011-06-20 DIAGNOSIS — M898X8 Other specified disorders of bone, other site: Secondary | ICD-10-CM

## 2011-06-20 MED ORDER — IOHEXOL 300 MG/ML  SOLN: 75.0000 mL | Freq: Once | INTRAMUSCULAR | Status: AC | PRN

## 2011-06-22 ENCOUNTER — Telehealth (INDEPENDENT_AMBULATORY_CARE_PROVIDER_SITE_OTHER): Payer: Self-pay

## 2011-06-22 DIAGNOSIS — M898X8 Other specified disorders of bone, other site: Secondary | ICD-10-CM

## 2011-06-22 NOTE — Telephone Encounter (Signed)
Chris new pt cordinator called to request pt's records for them to review to get pt in for appt. I will fax records to her and she will call pt with appt and then call me with appt time/ [facility]

## 2011-06-22 NOTE — Telephone Encounter (Signed)
Called pt to notify her that Dr Lacy Duverney office declined her referral b/c they do not participate with her insurance comp. I asked the pt to call her insurance company to see who is on her provider list for Neurosurgery. The pt will call me back/ [facility]

## 2011-06-22 NOTE — Telephone Encounter (Signed)
LMOM with new pt cordinator to try to get pt in to their group for an appt b/c Dr Lacy Duverney office didn't participate w/insurance./ [facility]

## 2011-06-25 ENCOUNTER — Other Ambulatory Visit: Payer: Self-pay | Admitting: Hematology & Oncology

## 2011-06-25 ENCOUNTER — Encounter: Payer: Self-pay | Admitting: Hematology & Oncology

## 2011-06-25 DIAGNOSIS — I82409 Acute embolism and thrombosis of unspecified deep veins of unspecified lower extremity: Secondary | ICD-10-CM

## 2011-06-25 DIAGNOSIS — D6861 Antiphospholipid syndrome: Secondary | ICD-10-CM | POA: Diagnosis present

## 2011-06-25 DIAGNOSIS — M797 Fibromyalgia: Secondary | ICD-10-CM | POA: Insufficient documentation

## 2011-06-25 HISTORY — DX: Acute embolism and thrombosis of unspecified deep veins of unspecified lower extremity: I82.409

## 2011-06-25 HISTORY — DX: Antiphospholipid syndrome: D68.61

## 2011-06-28 ENCOUNTER — Encounter (HOSPITAL_COMMUNITY): Payer: Self-pay | Admitting: Pharmacy Technician

## 2011-07-02 ENCOUNTER — Encounter (INDEPENDENT_AMBULATORY_CARE_PROVIDER_SITE_OTHER): Payer: Self-pay | Admitting: Surgery

## 2011-07-02 ENCOUNTER — Ambulatory Visit (INDEPENDENT_AMBULATORY_CARE_PROVIDER_SITE_OTHER): Payer: Medicare Other | Admitting: Surgery

## 2011-07-02 VITALS — BP 144/88 | HR 68 | Temp 97.4°F | Resp 16 | Ht 64.0 in | Wt 195.0 lb

## 2011-07-02 DIAGNOSIS — M949 Disorder of cartilage, unspecified: Secondary | ICD-10-CM

## 2011-07-02 DIAGNOSIS — M898X8 Other specified disorders of bone, other site: Secondary | ICD-10-CM | POA: Insufficient documentation

## 2011-07-02 DIAGNOSIS — M899 Disorder of bone, unspecified: Secondary | ICD-10-CM

## 2011-07-02 NOTE — Progress Notes (Signed)
Subjective:     Patient ID: Karen Gonzales, female   DOB: September 21, 1945, 65 y.o.   MRN: [id number]  HPI  Patient Care Team: Gennette Pac as PCP - General (Family Medicine) Charlie Pitter as Consulting Physician (Neurosurgery)  This patient is a 65 y.o.female who presents today for surgical evaluation.   Procedure: Scalp exploration. 06/15/2011  Patient comes back today feeling okay. She was disappointed that I could not remove the mass as it was within the skull and not within the scalp. We did work to see Dr. Annette Stable with neurosurgery. He is planning to excise the skull mass next week. She still has some mild/moderate headaches that are to the right and posterior there that occasionally radiate to the neck. It was a little better with the local anesthetic but then came back after wore off. No vision or hearing problems. No nasal drainage. She is shampooing her hair.  Past Medical History  Diagnosis Date  . Arthritis   . Blood transfusion   . Hyperlipidemia   . Hypertension   . GERD (gastroesophageal reflux disease)   . Heart murmur   . Diabetes mellitus   . Cough   . Chills   . Anti-cardiolipin antibody syndrome 06/25/2011  . DVT (deep venous thrombosis) 06/25/2011  . Fibromyalgia 06/25/2011    Past Surgical History  Procedure Date  . Joint replacement 2004    left knee replacement   . Knee surgery     total of 6 surgeries on both knees   . Carpal tunnel release     bilateral   . Achilles tendon surgery     bilateral   . Tumor removal     on pelvic bone   . Cholecystectomy 1969    open    History   Social History  . Marital Status: Married    Spouse Name: N/A    Number of Children: N/A  . Years of Education: N/A   Occupational History  . Not on file.   Social History Main Topics  . Smoking status: Never Smoker   . Smokeless tobacco: Never Used  . Alcohol Use: No  . Drug Use: No  . Sexually Active:    Other Topics Concern  . Not on file   Social  History Narrative  . No narrative on file    Family History  Problem Relation Age of Onset  . Kidney disease Mother   . Heart disease Father   . Cancer Father     lymphoma  . Cancer Maternal Grandmother     lymphoma  . Cancer Paternal Grandmother     breast    Current outpatient prescriptions:amLODipine (NORVASC) 10 MG tablet, Take 10 mg by mouth daily. , Disp: , Rfl: ;  Cholecalciferol (VITAMIN D PO), Take 1 tablet by mouth daily. , Disp: , Rfl: ;  cloNIDine (CATAPRES) 0.1 MG tablet, Take 0.1 mg by mouth 2 (two) times daily. , Disp: , Rfl: ;  HYDROcodone-acetaminophen (NORCO) 5-325 MG per tablet, Ad lib., Disp: , Rfl:  lisinopril (PRINIVIL,ZESTRIL) 20 MG tablet, Take 20 mg by mouth daily. , Disp: , Rfl: ;  metFORMIN (GLUCOPHAGE) 500 MG tablet, Take 500 mg by mouth 2 (two) times daily with a meal. , Disp: , Rfl: ;  omeprazole (PRILOSEC) 20 MG capsule, Take 20 mg by mouth daily.  , Disp: , Rfl: ;  pravastatin (PRAVACHOL) 20 MG tablet, Take 20 mg by mouth daily. , Disp: , Rfl:   No Known Allergies  Review of Systems  Constitutional: Negative for fever, chills and diaphoresis.  HENT: Positive for neck stiffness. Negative for ear pain, nosebleeds, sore throat, rhinorrhea, drooling, mouth sores, trouble swallowing, postnasal drip, tinnitus and ear discharge.   Eyes: Negative for photophobia and visual disturbance.  Respiratory: Negative for cough and choking.   Cardiovascular: Negative for chest pain and palpitations.  Gastrointestinal: Negative for nausea, vomiting, abdominal pain, diarrhea, constipation, anal bleeding and rectal pain.  Genitourinary: Negative for dysuria, frequency and difficulty urinating.  Musculoskeletal: Negative for myalgias and gait problem.  Skin: Negative for color change, pallor and rash.  Neurological: Positive for headaches. Negative for dizziness, speech difficulty, weakness and numbness.  Hematological: Negative for adenopathy.    Psychiatric/Behavioral: Negative for confusion and agitation. The patient is not nervous/anxious.        Objective:   Physical Exam  Constitutional: She is oriented to person, place, and time. She appears well-developed and well-nourished. No distress.  HENT:  Head: Normocephalic.  Mouth/Throat: Oropharynx is clear and moist. No oropharyngeal exudate.       R vertex scalp incision closed.  No cellulitis  Eyes: Conjunctivae and EOM are normal. Pupils are equal, round, and reactive to light. No scleral icterus.  Neck: Normal range of motion. No tracheal deviation present.  Cardiovascular: Normal rate and intact distal pulses.   Pulmonary/Chest: Effort normal. No respiratory distress. She exhibits no tenderness.  Abdominal: Soft. She exhibits no distension. There is no tenderness. Hernia confirmed negative in the right inguinal area and confirmed negative in the left inguinal area.       Incisions clean with normal healing ridges.  No hernias  Genitourinary: No vaginal discharge found.  Musculoskeletal: Normal range of motion. She exhibits no tenderness.  Lymphadenopathy:       Right: No inguinal adenopathy present.       Left: No inguinal adenopathy present.  Neurological: She is alert and oriented to person, place, and time. No cranial nerve deficit. She exhibits normal muscle tone. Coordination normal.  Skin: Skin is warm and dry. No rash noted. She is not diaphoretic.  Psychiatric: She has a normal mood and affect. Her behavior is normal.       Assessment:     Skull mass     Plan:     Increase activity as tolerated.  Do not push through pain.  Excision of skull mass by Dr. Annette Stable next Monday  Return to clinic p.r.n. The patient expressed understanding and appreciation

## 2011-07-03 ENCOUNTER — Encounter (HOSPITAL_COMMUNITY)
Admission: RE | Admit: 2011-07-03 | Discharge: 2011-07-03 | Disposition: A | Discharge status: Home / Self Care | Payer: Medicare Other | Source: Ambulatory Visit | Attending: Neurosurgery | Admitting: Neurosurgery

## 2011-07-03 ENCOUNTER — Encounter (HOSPITAL_COMMUNITY): Payer: Self-pay

## 2011-07-03 HISTORY — DX: Bronchitis, not specified as acute or chronic: J40

## 2011-07-03 HISTORY — DX: Adverse effect of unspecified anesthetic, initial encounter: T41.45XA

## 2011-07-03 HISTORY — DX: Nausea with vomiting, unspecified: R11.2

## 2011-07-03 HISTORY — DX: Other specified postprocedural states: Z98.890

## 2011-07-03 HISTORY — DX: Other complications of anesthesia, initial encounter: T88.59XA

## 2011-07-03 LAB — BASIC METABOLIC PANEL
BUN: 11 mg/dL (ref 6–23)
Creatinine, Ser: 0.69 mg/dL (ref 0.50–1.10)
GFR calc non Af Amer: >90 mL/min (ref >90)
Glucose, Bld: 156 mg/dL — ABNORMAL HIGH (ref 70–99)
Potassium: 4.2 mEq/L (ref 3.5–5.1)

## 2011-07-03 LAB — CBC
HCT: 38.9 % (ref 36.0–46.0)
Hemoglobin: 13.4 g/dL (ref 12.0–15.0)
MCH: 29.1 pg (ref 26.0–34.0)
MCHC: 34.4 g/dL (ref 30.0–36.0)
RDW: 12.9 % (ref 11.5–15.5)

## 2011-07-03 NOTE — Pre-Procedure Instructions (Signed)
[place]  07/03/2011   Your procedure is scheduled on:  Monday July 09, 2011  Report to [place] at 1200noon.  Call this number if you have problems the morning of surgery: [phone]   Remember:   Do not eat food:After Midnight.  Do not drink clear liquids: 4 Hours before arrival.(may have clear liquids:  Soda, black coffee, tea, grape juice, apple juice, broth and water up to 8:00am      Take these medicines the morning of surgery with A SIP OF WATER: amlodipine, hydrocodone, omeprazole, clonidine    Do not wear jewelry, make-up or nail polish.  Do not wear lotions, powders, or perfumes. You may wear deodorant.  Do not shave 48 hours prior to surgery.  Do not bring valuables to the hospital.  Contacts, dentures or bridgework may not be worn into surgery.  Leave suitcase in the car. After surgery it may be brought to your room.  For patients admitted to the hospital, checkout time is 11:00 AM the day of discharge.   Patients discharged the day of surgery will not be allowed to drive home.  Name and phone number of your driver: Kinze Labo Sr. [phone]  Special Instructions: CHG Shower Use Special Wash: 1/2 bottle night before surgery and 1/2 bottle morning of surgery.   Please read over the following fact sheets that you were given: Pain Booklet, Coughing and Deep Breathing, MRSA Information and Surgical Site Infection Prevention

## 2011-07-03 NOTE — Progress Notes (Signed)
Late Entry:  26am  Called Dr. Marchelle Folks office, left voicemail for [place] requesting orders.

## 2011-07-03 NOTE — Progress Notes (Signed)
EKG done 06/07/2011 and CXR done 06/04/2011.

## 2011-07-09 ENCOUNTER — Encounter (HOSPITAL_COMMUNITY): Payer: Self-pay | Admitting: *Deleted

## 2011-07-09 ENCOUNTER — Other Ambulatory Visit: Payer: Self-pay | Admitting: Neurosurgery

## 2011-07-09 ENCOUNTER — Encounter (HOSPITAL_COMMUNITY): Payer: Self-pay | Admitting: Anesthesiology

## 2011-07-09 ENCOUNTER — Encounter (HOSPITAL_COMMUNITY): Payer: Self-pay | Admitting: Neurosurgery

## 2011-07-09 ENCOUNTER — Inpatient Hospital Stay (HOSPITAL_COMMUNITY): Payer: Medicare Other | Admitting: Anesthesiology

## 2011-07-09 ENCOUNTER — Encounter (HOSPITAL_COMMUNITY)
Admission: RE | Disposition: A | Discharge status: Home / Self Care | Payer: Self-pay | Source: Ambulatory Visit | Attending: Neurosurgery

## 2011-07-09 ENCOUNTER — Inpatient Hospital Stay (HOSPITAL_COMMUNITY)
Admission: RE | Admit: 2011-07-09 | Discharge: 2011-07-10 | DRG: 497 | Disposition: A | Discharge status: Home / Self Care | Payer: Medicare Other | Source: Ambulatory Visit | Attending: Neurosurgery | Admitting: Neurosurgery

## 2011-07-09 DIAGNOSIS — M899 Disorder of bone, unspecified: Principal | ICD-10-CM | POA: Diagnosis present

## 2011-07-09 DIAGNOSIS — M949 Disorder of cartilage, unspecified: Principal | ICD-10-CM | POA: Diagnosis present

## 2011-07-09 DIAGNOSIS — Z96659 Presence of unspecified artificial knee joint: Secondary | ICD-10-CM

## 2011-07-09 DIAGNOSIS — D164 Benign neoplasm of bones of skull and face: Secondary | ICD-10-CM | POA: Diagnosis present

## 2011-07-09 HISTORY — PX: CRANIOTOMY: SHX93

## 2011-07-09 LAB — DIFFERENTIAL
Eosinophils Relative: 2 % (ref 0–5)
Lymphocytes Relative: 39 % (ref 12–46)
Monocytes Absolute: 0.8 10*3/uL (ref 0.1–1.0)
Monocytes Relative: 10 % (ref 3–12)
Neutro Abs: 3.6 10*3/uL (ref 1.7–7.7)

## 2011-07-09 LAB — GLUCOSE, CAPILLARY
Glucose-Capillary: 147 mg/dL — ABNORMAL HIGH (ref 70–99)
Glucose-Capillary: 130 mg/dL — ABNORMAL HIGH (ref 70–99)

## 2011-07-09 LAB — CBC
HCT: 38.5 % (ref 36.0–46.0)
Hemoglobin: 13.6 g/dL (ref 12.0–15.0)
MCH: 29.8 pg (ref 26.0–34.0)
MCHC: 35.3 g/dL (ref 30.0–36.0)
MCV: 84.4 fL (ref 78.0–100.0)
RDW: 12.8 % (ref 11.5–15.5)

## 2011-07-09 SURGERY — CRANIOTOMY HEMATOMA EVACUATION SUBDURAL
Anesthesia: General | Site: Head | Laterality: Right | Wound class: Clean

## 2011-07-09 MED ORDER — ROCURONIUM BROMIDE 100 MG/10ML IV SOLN
INTRAVENOUS | Status: DC | PRN
  Administered 2011-07-09: 50 mg via INTRAVENOUS

## 2011-07-09 MED ORDER — ONDANSETRON HCL 4 MG/2ML IJ SOLN
4.0000 mg | INTRAMUSCULAR | Status: DC | PRN
  Administered 2011-07-09 (×3): 4 mg via INTRAVENOUS
  Filled 2011-07-09 (×4): qty 2

## 2011-07-09 MED ORDER — PROPOFOL 10 MG/ML IV EMUL
INTRAVENOUS | Status: DC | PRN
  Administered 2011-07-09: 200 mg via INTRAVENOUS
  Administered 2011-07-09: 50 mg via INTRAVENOUS

## 2011-07-09 MED ORDER — CLONIDINE HCL 0.1 MG PO TABS
0.1000 mg | ORAL_TABLET | Freq: Two times a day (BID) | ORAL | Status: DC
  Administered 2011-07-09: 0.1 mg via ORAL
  Filled 2011-07-09 (×3): qty 1

## 2011-07-09 MED ORDER — HYDROMORPHONE HCL PF 1 MG/ML IJ SOLN
0.2500 mg | INTRAMUSCULAR | Status: DC | PRN
  Administered 2011-07-09 (×2): 0.5 mg via INTRAVENOUS

## 2011-07-09 MED ORDER — VITAMIN D3 25 MCG (1000 UNIT) PO TABS
1000.0000 [IU] | ORAL_TABLET | Freq: Every day | ORAL | Status: DC
  Administered 2011-07-09: 1000 [IU] via ORAL
  Filled 2011-07-09 (×2): qty 1

## 2011-07-09 MED ORDER — BUPIVACAINE HCL (PF) 0.25 % IJ SOLN
INTRAMUSCULAR | Status: DC | PRN
  Administered 2011-07-09: 8 mL

## 2011-07-09 MED ORDER — METFORMIN HCL 500 MG PO TABS
500.0000 mg | ORAL_TABLET | Freq: Two times a day (BID) | ORAL | Status: DC
  Administered 2011-07-09 – 2011-07-10 (×2): 500 mg via ORAL
  Filled 2011-07-09 (×4): qty 1

## 2011-07-09 MED ORDER — CEFAZOLIN SODIUM-DEXTROSE 2-3 GM-% IV SOLR
INTRAVENOUS | Status: AC
  Filled 2011-07-09: qty 50

## 2011-07-09 MED ORDER — HEMOSTATIC AGENTS (NO CHARGE) OPTIME
TOPICAL | Status: DC | PRN
  Administered 2011-07-09: 1 via TOPICAL

## 2011-07-09 MED ORDER — LISINOPRIL 20 MG PO TABS
20.0000 mg | ORAL_TABLET | Freq: Every day | ORAL | Status: DC
  Administered 2011-07-09: 20 mg via ORAL
  Filled 2011-07-09 (×2): qty 1

## 2011-07-09 MED ORDER — PROMETHAZINE HCL 25 MG/ML IJ SOLN: 6.2500 mg | INTRAMUSCULAR | Status: DC | PRN

## 2011-07-09 MED ORDER — GLYCOPYRROLATE 0.2 MG/ML IJ SOLN
INTRAMUSCULAR | Status: DC | PRN
  Administered 2011-07-09: .4 mg via INTRAVENOUS

## 2011-07-09 MED ORDER — OXYCODONE-ACETAMINOPHEN 5-325 MG PO TABS: 1.0000 | ORAL_TABLET | ORAL | Status: DC | PRN

## 2011-07-09 MED ORDER — LACTATED RINGERS IV SOLN
INTRAVENOUS | Status: DC | PRN
  Administered 2011-07-09: 10:00:00 via INTRAVENOUS

## 2011-07-09 MED ORDER — ACETAMINOPHEN 325 MG PO TABS: 650.0000 mg | ORAL_TABLET | ORAL | Status: DC | PRN

## 2011-07-09 MED ORDER — SODIUM CHLORIDE 0.9 % IJ SOLN: 3.0000 mL | INTRAMUSCULAR | Status: DC | PRN

## 2011-07-09 MED ORDER — NEOSTIGMINE METHYLSULFATE 1 MG/ML IJ SOLN
INTRAMUSCULAR | Status: DC | PRN
  Administered 2011-07-09: 4 mg via INTRAVENOUS

## 2011-07-09 MED ORDER — HYDROMORPHONE HCL PF 1 MG/ML IJ SOLN
0.5000 mg | INTRAMUSCULAR | Status: DC | PRN
  Administered 2011-07-09 – 2011-07-10 (×2): 1 mg via INTRAVENOUS
  Filled 2011-07-09 (×2): qty 1

## 2011-07-09 MED ORDER — CEFAZOLIN SODIUM-DEXTROSE 2-3 GM-% IV SOLR
2.0000 g | INTRAVENOUS | Status: AC
  Administered 2011-07-09: 2 g via INTRAVENOUS

## 2011-07-09 MED ORDER — HYDROCODONE-ACETAMINOPHEN 5-325 MG PO TABS: 1.0000 | ORAL_TABLET | ORAL | Status: DC | PRN

## 2011-07-09 MED ORDER — ALUM & MAG HYDROXIDE-SIMETH 400-400-40 MG/5ML PO SUSP
30.0000 mL | Freq: Four times a day (QID) | ORAL | Status: DC | PRN
  Filled 2011-07-09: qty 30

## 2011-07-09 MED ORDER — CEFAZOLIN SODIUM 1-5 GM-% IV SOLN
1.0000 g | Freq: Three times a day (TID) | INTRAVENOUS | Status: AC
  Administered 2011-07-09 (×2): 1 g via INTRAVENOUS
  Filled 2011-07-09 (×2): qty 50

## 2011-07-09 MED ORDER — AMLODIPINE BESYLATE 10 MG PO TABS
10.0000 mg | ORAL_TABLET | Freq: Every day | ORAL | Status: DC
  Administered 2011-07-09: 10 mg via ORAL
  Filled 2011-07-09 (×2): qty 1

## 2011-07-09 MED ORDER — ONDANSETRON HCL 4 MG/2ML IJ SOLN
INTRAMUSCULAR | Status: DC | PRN
  Administered 2011-07-09: 4 mg via INTRAVENOUS

## 2011-07-09 MED ORDER — FENTANYL CITRATE 0.05 MG/ML IJ SOLN
INTRAMUSCULAR | Status: DC | PRN
  Administered 2011-07-09: 50 ug via INTRAVENOUS
  Administered 2011-07-09: 100 ug via INTRAVENOUS

## 2011-07-09 MED ORDER — PHENOL 1.4 % MT LIQD: 1.0000 | OROMUCOSAL | Status: DC | PRN

## 2011-07-09 MED ORDER — MENTHOL 3 MG MT LOZG: 1.0000 | LOZENGE | OROMUCOSAL | Status: DC | PRN

## 2011-07-09 MED ORDER — SIMVASTATIN 10 MG PO TABS
10.0000 mg | ORAL_TABLET | Freq: Every day | ORAL | Status: DC
  Administered 2011-07-09: 10 mg via ORAL
  Filled 2011-07-09 (×2): qty 1

## 2011-07-09 MED ORDER — PHENYLEPHRINE HCL 10 MG/ML IJ SOLN
INTRAMUSCULAR | Status: DC | PRN
  Administered 2011-07-09 (×2): 40 ug via INTRAVENOUS

## 2011-07-09 MED ORDER — SODIUM CHLORIDE 0.9 % IR SOLN
Status: DC | PRN
  Administered 2011-07-09: 1000 mL

## 2011-07-09 MED ORDER — SODIUM CHLORIDE 0.9 % IR SOLN
Status: DC | PRN
  Administered 2011-07-09 (×2)

## 2011-07-09 MED ORDER — MIDAZOLAM HCL 5 MG/5ML IJ SOLN
INTRAMUSCULAR | Status: DC | PRN
  Administered 2011-07-09: 2 mg via INTRAVENOUS

## 2011-07-09 MED ORDER — DOCUSATE SODIUM 100 MG PO CAPS: 100.0000 mg | ORAL_CAPSULE | Freq: Two times a day (BID) | ORAL | Status: DC

## 2011-07-09 MED ORDER — DEXAMETHASONE SODIUM PHOSPHATE 10 MG/ML IJ SOLN
INTRAMUSCULAR | Status: AC
  Filled 2011-07-09: qty 1

## 2011-07-09 MED ORDER — DEXAMETHASONE SODIUM PHOSPHATE 10 MG/ML IJ SOLN: 10.0000 mg | Freq: Once | INTRAMUSCULAR | Status: DC

## 2011-07-09 MED ORDER — ACETAMINOPHEN 650 MG RE SUPP: 650.0000 mg | RECTAL | Status: DC | PRN

## 2011-07-09 MED ORDER — PANTOPRAZOLE SODIUM 40 MG PO TBEC
40.0000 mg | DELAYED_RELEASE_TABLET | Freq: Every day | ORAL | Status: DC
  Administered 2011-07-09: 40 mg via ORAL
  Filled 2011-07-09: qty 1

## 2011-07-09 SURGICAL SUPPLY — 71 items
ADH SKN CLS APL DERMABOND .7 (GAUZE/BANDAGES/DRESSINGS)
BAG DECANTER FOR FLEXI CONT (MISCELLANEOUS) ×2 IMPLANT
BANDAGE GAUZE 4  KLING STR (GAUZE/BANDAGES/DRESSINGS) IMPLANT
BIT DRILL WIRE PASS 1.3MM (BIT) ×1 IMPLANT
BLADE SURG 11 STRL SS (BLADE) IMPLANT
BNDG COHESIVE 4X5 TAN NS LF (GAUZE/BANDAGES/DRESSINGS) IMPLANT
BRUSH SCRUB EZ 1% IODOPHOR (MISCELLANEOUS) IMPLANT
BRUSH SCRUB EZ PLAIN DRY (MISCELLANEOUS) ×2 IMPLANT
BUR ACORN 6.0 PRECISION (BURR) ×2 IMPLANT
BUR ROUTER D-58 CRANI (BURR) IMPLANT
CANISTER SUCTION 2500CC (MISCELLANEOUS) ×2 IMPLANT
CLIP TI MEDIUM 6 (CLIP) IMPLANT
CLOTH BEACON ORANGE TIMEOUT ST (SAFETY) ×2 IMPLANT
CONT SPEC 4OZ CLIKSEAL STRL BL (MISCELLANEOUS) ×3 IMPLANT
CORDS BIPOLAR (ELECTRODE) ×2 IMPLANT
COVER TABLE BACK 60X90 (DRAPES) ×2 IMPLANT
DERMABOND ADVANCED (GAUZE/BANDAGES/DRESSINGS)
DERMABOND ADVANCED .7 DNX12 (GAUZE/BANDAGES/DRESSINGS) IMPLANT
DRAIN CHANNEL 10M FLAT 3/4 FLT (DRAIN) IMPLANT
DRAPE MICROSCOPE ZEISS OPMI (DRAPES) IMPLANT
DRAPE NEUROLOGICAL W/INCISE (DRAPES) ×2 IMPLANT
DRAPE SURG 17X23 STRL (DRAPES) IMPLANT
DRAPE WARM FLUID 44X44 (DRAPE) ×1 IMPLANT
DRESSING TELFA 8X3 (GAUZE/BANDAGES/DRESSINGS) IMPLANT
DRILL WIRE PASS 1.3MM (BIT) ×2
ELECT CAUTERY BLADE 6.4 (BLADE) ×2 IMPLANT
ELECT REM PT RETURN 9FT ADLT (ELECTROSURGICAL) ×2
ELECTRODE REM PT RTRN 9FT ADLT (ELECTROSURGICAL) ×1 IMPLANT
EVACUATOR SILICONE 100CC (DRAIN) IMPLANT
GAUZE SPONGE 4X4 16PLY XRAY LF (GAUZE/BANDAGES/DRESSINGS) IMPLANT
GLOVE ECLIPSE 8.5 STRL (GLOVE) ×2 IMPLANT
GLOVE EXAM NITRILE LRG STRL (GLOVE) IMPLANT
GLOVE EXAM NITRILE MD LF STRL (GLOVE) IMPLANT
GLOVE EXAM NITRILE XL STR (GLOVE) IMPLANT
GLOVE EXAM NITRILE XS STR PU (GLOVE) IMPLANT
GOWN BRE IMP SLV AUR LG STRL (GOWN DISPOSABLE) ×1 IMPLANT
GOWN BRE IMP SLV AUR XL STRL (GOWN DISPOSABLE) ×1 IMPLANT
GOWN STRL REIN 2XL LVL4 (GOWN DISPOSABLE) IMPLANT
HEMOSTAT SURGICEL 2X14 (HEMOSTASIS) IMPLANT
HOOK DURA (MISCELLANEOUS) ×1 IMPLANT
KIT BASIN OR (CUSTOM PROCEDURE TRAY) ×2 IMPLANT
KIT ROOM TURNOVER OR (KITS) ×2 IMPLANT
MESH PRE-CONTOURED 40MM 0.6MM (Mesh General) ×1 IMPLANT
NDL HYPO 25X1 1.5 SAFETY (NEEDLE) ×1 IMPLANT
NEEDLE HYPO 25X1 1.5 SAFETY (NEEDLE) ×2 IMPLANT
NS IRRIG 1000ML POUR BTL (IV SOLUTION) ×2 IMPLANT
PACK CRANIOTOMY (CUSTOM PROCEDURE TRAY) ×2 IMPLANT
PAD ARMBOARD 7.5X6 YLW CONV (MISCELLANEOUS) ×3 IMPLANT
PAD EYE OVAL STERILE LF (GAUZE/BANDAGES/DRESSINGS) IMPLANT
PATTIES SURGICAL .25X.25 (GAUZE/BANDAGES/DRESSINGS) IMPLANT
PATTIES SURGICAL .5 X.5 (GAUZE/BANDAGES/DRESSINGS) IMPLANT
PATTIES SURGICAL .5 X3 (DISPOSABLE) IMPLANT
PATTIES SURGICAL 1X1 (DISPOSABLE) IMPLANT
PIN MAYFIELD SKULL DISP (PIN) IMPLANT
SCREW SELF DRILL HT 1.5/4MM (Screw) ×4 IMPLANT
SPECIMEN JAR SMALL (MISCELLANEOUS) ×1 IMPLANT
SPONGE GAUZE 4X4 12PLY (GAUZE/BANDAGES/DRESSINGS) ×2 IMPLANT
SPONGE LAP 18X18 X RAY DECT (DISPOSABLE) IMPLANT
SPONGE NEURO XRAY DETECT 1X3 (DISPOSABLE) IMPLANT
SPONGE SURGIFOAM ABS GEL 100 (HEMOSTASIS) ×2 IMPLANT
SPONGE SURGIFOAM ABS GEL SZ50 (HEMOSTASIS) ×1 IMPLANT
STAPLER VISISTAT 35W (STAPLE) ×2 IMPLANT
SUT NURALON 4 0 TR CR/8 (SUTURE) ×2 IMPLANT
SUT VIC AB 2-0 CT2 18 VCP726D (SUTURE) ×3 IMPLANT
SYR 20ML ECCENTRIC (SYRINGE) ×2 IMPLANT
SYR CONTROL 10ML LL (SYRINGE) ×2 IMPLANT
TOWEL OR 17X24 6PK STRL BLUE (TOWEL DISPOSABLE) ×3 IMPLANT
TOWEL OR 17X26 10 PK STRL BLUE (TOWEL DISPOSABLE) ×2 IMPLANT
TRAY FOLEY CATH 14FRSI W/METER (CATHETERS) IMPLANT
UNDERPAD 30X30 INCONTINENT (UNDERPADS AND DIAPERS) IMPLANT
WATER STERILE IRR 1000ML POUR (IV SOLUTION) ×2 IMPLANT

## 2011-07-09 NOTE — Anesthesia Procedure Notes (Addendum)
Procedure Name: Intubation Date/Time: 07/09/2011 10:48 AM Performed by: Maeola Harman Pre-anesthesia Checklist: Patient identified Patient Re-evaluated:Patient Re-evaluated prior to inductionOxygen Delivery Method: Circle System Utilized Preoxygenation: Pre-oxygenation with 100% oxygen Intubation Type: IV induction Ventilation: Mask ventilation without difficulty Grade View: Grade I Tube type: Oral Tube size: 7.5 mm Number of attempts: 1 Airway Equipment and Method: stylet Placement Confirmation: ETT inserted through vocal cords under direct vision,  positive ETCO2 and breath sounds checked- equal and bilateral Secured at: 22 cm Tube secured with: Tape Dental Injury: Teeth and Oropharynx as per pre-operative assessment

## 2011-07-09 NOTE — Anesthesia Preprocedure Evaluation (Addendum)
Anesthesia Evaluation  Patient identified by MRN, date of birth, ID band Patient awake    Reviewed: Allergy & Precautions, H&P , NPO status , Patient's Chart, lab work & pertinent test results  History of Anesthesia Complications (+) PONV  Airway Mallampati: III TM Distance: <3 FB Neck ROM: Full    Dental   Pulmonary    Pulmonary exam normal       Cardiovascular hypertension, Pt. on medications     Neuro/Psych  Neuromuscular disease    GI/Hepatic   Endo/Other  Diabetes mellitus-, Well Controlled  Renal/GU      Musculoskeletal   Abdominal   Peds  Hematology   Anesthesia Other Findings   Reproductive/Obstetrics                         Anesthesia Physical Anesthesia Plan  ASA: III  Anesthesia Plan: General   Post-op Pain Management:    Induction: Intravenous  Airway Management Planned: Oral ETT and Video Laryngoscope Planned  Additional Equipment:   Intra-op Plan:   Post-operative Plan: Extubation in OR  Informed Consent: I have reviewed the patients History and Physical, chart, labs and discussed the procedure including the risks, benefits and alternatives for the proposed anesthesia with the patient or authorized representative who has indicated his/her understanding and acceptance.   Dental advisory given  Plan Discussed with: CRNA and Surgeon  Anesthesia Plan Comments:        Anesthesia Quick Evaluation

## 2011-07-09 NOTE — H&P (Signed)
Karen Gonzales is an 65 y.o. female.   Chief Complaint: Skull tumor HPI: 65 year old female with Karen painful enlarging skull mass. Patient is status post attempted resection of presumed sebaceous cyst by another physician which revealed this to be Karen bony lesion of the skull itself. Patient reports pain and enlargement over time. I've discussed options including both operative and observational care. Patient wishes to have the lesion resected.  Past Medical History  Diagnosis Date  . Arthritis   . Hyperlipidemia   . Hypertension   . GERD (gastroesophageal reflux disease)   . Heart murmur   . Diabetes mellitus   . Cough   . Chills   . Anti-cardiolipin antibody syndrome 06/25/2011  . DVT (deep venous thrombosis) 06/25/2011  . Complication of anesthesia   . PONV (postoperative nausea and vomiting)   . Bronchitis     history of  . Blood transfusion     had when knee was replaced    Past Surgical History  Procedure Date  . Joint replacement 2004    left knee replacement   . Knee surgery     total of 6 surgeries on both knees   . Carpal tunnel release     bilateral   . Achilles tendon surgery     bilateral   . Tumor removal     on pelvic bone   . Cholecystectomy 1969    open  . Dilation and curettage of uterus   . Tubal ligation   . Abdominal hysterectomy     Family History  Problem Relation Age of Onset  . Kidney disease Mother   . Heart disease Father   . Cancer Father     lymphoma  . Cancer Maternal Grandmother     lymphoma  . Cancer Paternal Grandmother     breast   Social History:  reports that she has never smoked. She has never used smokeless tobacco. She reports that she does not drink alcohol or use illicit drugs.  Allergies: No Known Allergies  Medications Prior to Admission  Medication Dose Route Frequency Provider Last Rate Last Dose  . ceFAZolin (ANCEF) 2-3 GM-% IVPB SOLR           . ceFAZolin (ANCEF) IVPB 2 g/50 mL premix  2 g Intravenous 60 min  Pre-Op Karen Gonzales Karen Gonzales      . dexamethasone (DECADRON) 10 MG/ML injection           . dexamethasone (DECADRON) injection 10 mg  10 mg Intravenous Once Karen Gonzales       Medications Prior to Admission  Medication Sig Dispense Refill  . amLODipine (NORVASC) 10 MG tablet Take 10 mg by mouth daily.       . Cholecalciferol (VITAMIN D PO) Take 1 tablet by mouth daily.       . cloNIDine (CATAPRES) 0.1 MG tablet Take 0.1 mg by mouth 2 (two) times daily.       Marland Kitchen HYDROcodone-acetaminophen (NORCO) 5-325 MG per tablet Ad lib.      Marland Kitchen lisinopril (PRINIVIL,ZESTRIL) 20 MG tablet Take 20 mg by mouth daily.       . metFORMIN (GLUCOPHAGE) 500 MG tablet Take 500 mg by mouth 2 (two) times daily with Karen meal.       . omeprazole (PRILOSEC) 20 MG capsule Take 20 mg by mouth daily.        . pravastatin (PRAVACHOL) 20 MG tablet Take 20 mg by mouth daily.  Results for orders placed during the hospital encounter of 07/09/11 (from the past 48 hour(s))  GLUCOSE, CAPILLARY     Status: Abnormal   Collection Time   07/09/11  9:07 AM      Component Value Range Comment   Glucose-Capillary 124 (*) 70 - 99 (mg/dL)    No results found.  Review of Systems  All other systems reviewed and are negative.    Blood pressure 146/83, pulse 75, temperature 97.9 F (36.6 C), temperature source Oral, resp. rate 18, SpO2 97.00%. Physical Exam  Constitutional: She is oriented to person, place, and time. She appears well-developed and well-nourished.  HENT:  Head: Atraumatic.  Right Ear: External ear normal.  Left Ear: External ear normal.  Nose: Nose normal.  Mouth/Throat: Oropharynx is clear and moist.  Eyes: Conjunctivae and EOM are normal. Pupils are equal, round, and reactive to light.  Neck: Normal range of motion. Neck supple. No JVD present. No tracheal deviation present. No thyromegaly present.  Cardiovascular: Normal rate, regular rhythm and normal heart sounds.   Respiratory: Breath sounds normal. No  respiratory distress. She has no wheezes.  GI: Soft. Bowel sounds are normal. She exhibits no distension. There is no tenderness.  Neurological: She is alert and oriented to person, place, and time. She has normal reflexes. She displays normal reflexes. No cranial nerve deficit. She exhibits normal muscle tone. Coordination normal.  Skin: Skin is warm and dry.  Psychiatric: She has Karen normal mood and affect. Her behavior is normal. Judgment and thought content normal.   fixed bony lesion of the skull in the paramedian parietal region on the right. The remainder of her neurologic exam is normal. She has Karen well-healed incision overlying the skull mass.  Assessment/Plan Probable benign osseous lesion of the skull. No history of trauma or obvious malignancy. Plan for excisional craniectomy with titanium mesh cranioplasty. Risks and benefits have been explained. Patient wishes to proceed.  Koi Yarbro Karen 07/09/2011, 10:17 AM

## 2011-07-09 NOTE — Brief Op Note (Addendum)
07/09/2011  12:16 PM  PATIENT:  Karen Gonzales  65 y.o. female  PRE-OPERATIVE DIAGNOSIS:  skull tumor  POST-OPERATIVE DIAGNOSIS:  skull tumor  PROCEDURE:  Procedure(s): CRANIOTOMY HEMATOMA EVACUATION SUBDURAL  SURGEON:  Surgeon(s): [employer]  PHYSICIAN ASSISTANT:   ASSISTANTS: none   ANESTHESIA:   general  EBL:  Total I/O In: 1600 [I.V.:1600] Out: 50 [Blood:50]  BLOOD ADMINISTERED:none  DRAINS: none   LOCAL MEDICATIONS USED:  MARCAINE 10CC  SPECIMEN:  Source of Specimen:  skull  DISPOSITION OF SPECIMEN:  PATHOLOGY  COUNTS:  YES  TOURNIQUET:  * No tourniquets in log *  DICTATION: .Dragon Dictation  PLAN OF CARE: Admit to inpatient   PATIENT DISPOSITION:  PACU - hemodynamically stable.   Delay start of Pharmacological VTE agent (>24hrs) due to surgical blood loss or risk of bleeding:  Yes  Craniotomy for subdural was the incorrect procedure.  Procedure: Craniectomy for skull Tumor.  Cranioplasty with titanium mesh.

## 2011-07-09 NOTE — Transfer of Care (Signed)
Immediate Anesthesia Transfer of Care Note  Patient: Karen Gonzales  Procedure(s) Performed:  CRANIOTOMY HEMATOMA EVACUATION SUBDURAL - Excision of Skull Lesion-Right Right Parietal Crani w/ Resection of Skull Tumor (1 1/2 hours - to follow)  Patient Location: PACU  Anesthesia Type: General  Level of Consciousness: awake and alert   Airway & Oxygen Therapy: Patient Spontanous Breathing and Patient connected to face mask oxygen  Post-op Assessment: Report given to PACU RN  Post vital signs: Reviewed and stable  Complications: No apparent anesthesia complications

## 2011-07-09 NOTE — Anesthesia Postprocedure Evaluation (Signed)
  Anesthesia Post-op Note  Patient: Karen Gonzales  Procedure(s) Performed:  CRANIOTOMY HEMATOMA EVACUATION SUBDURAL - Excision of Skull Lesion-Right Right Parietal Crani w/ Resection of Skull Tumor (1 1/2 hours - to follow)  Patient Location: PACU  Anesthesia Type: General  Level of Consciousness: awake, alert  and oriented  Airway and Oxygen Therapy: Patient Spontanous Breathing  Post-op Pain: mild  Post-op Assessment: Post-op Vital signs reviewed, Patient's Cardiovascular Status Stable, Respiratory Function Stable, Patent Airway, No signs of Nausea or vomiting and Pain level controlled  Post-op Vital Signs: stable  Complications: No apparent anesthesia complications

## 2011-07-09 NOTE — Op Note (Signed)
Date of procedure: 07/09/2011  Date of dictation: 07/09/1999  Service: Neurosurgery  Preoperative diagnosis: Right parietal skull Mass  Postoperative diagnosis: Same  Procedure Name: Right parietal craniectomy for resection of skull tumor. Right parietal titanium mesh cranioplasty.  Surgeon:Maelin Kurkowski A.Oliva Montecalvo, M.D.  Asst. Surgeon: None  Anesthesia: General  Indication: Patient is a 65 year old female with a painful enlarging skull mass. Workup has demonstrated evidence of a bony skull lesion. No history of trauma or malignancy. Patient presents now for resection for diagnosis and treatment of her condition.  Operative note: Patient stated the operative postop we will supine position. After adequate level is the sheath, patient positioned with her neck slightly flexed. Patient's scalp was prepped and draped sterilely. 10 blade used to make a linear skin incision overlying the bony tumor. This carried down sharply to the paracranium. The bony lesion was readily apparent. Using a high-speed drill a craniectomy was performed. The bony lesion was completely resected and sent to pathology for further analysis. Beneath the bony lesion was exuberant tissue consistent with arachnoid granulations. These were quite related. There is no evidence of discrete tumor. There is no specimen sent from this lesion. The dura itself was otherwise unremarkable. Wound was then irrigated with and bike solution. Gelfoam was placed over the rectum defect. A piece of titanium mesh was cut and fashioned over the craniectomy defect. This was secured in place using 40 titanium screws. Wounds. One final time and closed in a typical fashion. Sterile dressing was applied. The patient. Patient tolerated the procedure well. The patient returns to the recovery room postop.

## 2011-07-10 ENCOUNTER — Encounter (HOSPITAL_COMMUNITY): Payer: Self-pay | Admitting: Neurosurgery

## 2011-07-10 LAB — GLUCOSE, CAPILLARY: Glucose-Capillary: 135 mg/dL — ABNORMAL HIGH (ref 70–99)

## 2011-07-10 MED ORDER — OXYCODONE-ACETAMINOPHEN 5-325 MG PO TABS: 1.0000 | ORAL_TABLET | ORAL | Status: AC | PRN

## 2011-07-10 MED ORDER — LACTATED RINGERS IV SOLN
INTRAVENOUS | Status: DC
  Administered 2011-07-10: 10:00:00 via INTRAVENOUS

## 2011-07-10 MED FILL — Dexamethasone Sodium Phosphate Ophth Soln 0.1%: OPHTHALMIC | Qty: 5 | Status: AC

## 2011-07-10 MED FILL — Sodium Chloride Irrigation Soln 0.9%: Qty: 500 | Status: AC

## 2011-07-10 NOTE — Discharge Summary (Signed)
Physician Discharge Summary  Patient ID: PHILIP ECKERSLEY MRN: [id number] DOB/AGE: 64-Aug-1947 64 y.o.  Admit date: 07/09/2011 Discharge date: 07/10/2011  Admission Diagnoses:  Discharge Diagnoses:  Active Problems:  * No active hospital problems. *    Discharged Condition: good  Hospital Course: Patient admitted to the hospital where she underwent an uncomplicated craniectomy and resection of skull tumor. Reconstruction was performed utilizing titanium mesh. Postoperatively she has had some difficulty with nausea and vomiting but otherwise is doing well. Currently she is tolerating regular diet she's up and Lipitor he am she has no neurologic dysfunction.  Consults: none  Significant Diagnostic Studies: None  Treatments: surgery: Right parietal craniectomy for resection of the skull tumor. Right parietal cranioplasty  Discharge Exam: Blood pressure 133/59, pulse 83, temperature 97.8 F (36.6 C), temperature source Oral, resp. rate 18, SpO2 95.00%. She is awake and alert oriented and appropriate. Cranial nerve function is intact. Motor and sensory function extremities is normal.   Disposition: Home or Self Care   Current Discharge Medication List    CONTINUE these medications which have NOT CHANGED   Details  amLODipine (NORVASC) 10 MG tablet Take 10 mg by mouth daily.     Cholecalciferol (VITAMIN D PO) Take 1 tablet by mouth daily.     cloNIDine (CATAPRES) 0.1 MG tablet Take 0.1 mg by mouth 2 (two) times daily.     HYDROcodone-acetaminophen (NORCO) 5-325 MG per tablet Ad lib.    lisinopril (PRINIVIL,ZESTRIL) 20 MG tablet Take 20 mg by mouth daily.     metFORMIN (GLUCOPHAGE) 500 MG tablet Take 500 mg by mouth 2 (two) times daily with a meal.     omeprazole (PRILOSEC) 20 MG capsule Take 20 mg by mouth daily.      pravastatin (PRAVACHOL) 20 MG tablet Take 20 mg by mouth daily.        Follow-up Information    Follow up with [place], DIXON in 1 week.   Contact  information:   [place] [address] [phone]          Signed: Charlie Pitter 07/10/2011, 10:56 AM

## 2011-07-10 NOTE — Progress Notes (Signed)
Patient ID: Karen Gonzales, female   DOB: 06-Feb-1946, 65 y.o.   MRN: [id number] Doing well on postop day one. Has some overnight nausea vomiting. Otherwise no problems. Minimal headache. Wound healing well. Neurologically intact. Plan discharge home to

## 2011-07-11 LAB — TYPE AND SCREEN
ABO/RH(D): A NEG
Antibody Screen: POSITIVE
DAT, IgG: NEGATIVE
Donor AG Type: NEGATIVE
Donor AG Type: NEGATIVE
PT AG Type: NEGATIVE
Unit division: 0
Unit division: 0

## 2011-08-27 ENCOUNTER — Other Ambulatory Visit: Payer: Self-pay | Admitting: Hematology & Oncology

## 2012-06-04 ENCOUNTER — Ambulatory Visit (INDEPENDENT_AMBULATORY_CARE_PROVIDER_SITE_OTHER): Payer: Medicare Other | Admitting: Family Medicine

## 2012-06-04 VITALS — BP 128/72 | HR 87 | Temp 97.9°F | Resp 16 | Ht 65.0 in | Wt 206.0 lb

## 2012-06-04 DIAGNOSIS — J40 Bronchitis, not specified as acute or chronic: Secondary | ICD-10-CM

## 2012-06-04 MED ORDER — DOXYCYCLINE HYCLATE 100 MG PO TABS: 100.0000 mg | ORAL_TABLET | Freq: Two times a day (BID) | ORAL | Status: DC

## 2012-06-04 MED ORDER — PREDNISONE 20 MG PO TABS: 40.0000 mg | ORAL_TABLET | Freq: Every day | ORAL | Status: DC

## 2012-06-04 NOTE — Patient Instructions (Signed)
Bronchitis Bronchitis is the body's way of reacting to injury and/or infection (inflammation) of the bronchi. Bronchi are the air tubes that extend from the windpipe into the lungs. If the inflammation becomes severe, it may cause shortness of breath. CAUSES  Inflammation may be caused by:  A virus.  Germs (bacteria).  Dust.  Allergens.  Pollutants and many other irritants. The cells lining the bronchial tree are covered with tiny hairs (cilia). These constantly beat upward, away from the lungs, toward the mouth. This keeps the lungs free of pollutants. When these cells become too irritated and are unable to do their job, mucus begins to develop. This causes the characteristic cough of bronchitis. The cough clears the lungs when the cilia are unable to do their job. Without either of these protective mechanisms, the mucus would settle in the lungs. Then you would develop pneumonia. Smoking is a common cause of bronchitis and can contribute to pneumonia. Stopping this habit is the single most important thing you can do to help yourself. TREATMENT   Your caregiver may prescribe an antibiotic if the cough is caused by bacteria. Also, medicines that open up your airways make it easier to breathe. Your caregiver may also recommend or prescribe an expectorant. It will loosen the mucus to be coughed up. Only take over-the-counter or prescription medicines for pain, discomfort, or fever as directed by your caregiver.  Removing whatever causes the problem (smoking, for example) is critical to preventing the problem from getting worse.  Cough suppressants may be prescribed for relief of cough symptoms.  Inhaled medicines may be prescribed to help with symptoms now and to help prevent problems from returning.  For those with recurrent (chronic) bronchitis, there may be a need for steroid medicines. SEEK IMMEDIATE MEDICAL CARE IF:   During treatment, you develop more pus-like mucus (purulent  sputum).  You have a fever.  Your baby is older than 3 months with a rectal temperature of 102 F (38.9 C) or higher.  Your baby is 27 months old or younger with a rectal temperature of 100.4 F (38 C) or higher.  You become progressively more ill.  You have increased difficulty breathing, wheezing, or shortness of breath. It is necessary to seek immediate medical care if you are elderly or sick from any other disease. MAKE SURE YOU:   Understand these instructions.  Will watch your condition.  Will get help right away if you are not doing well or get worse. Document Released: 07/30/2005 Document Revised: 10/22/2011 Document Reviewed: 06/08/2008 [facility] Patient Information 2013 [place].

## 2012-06-04 NOTE — Progress Notes (Signed)
66 yo woman with 6 weeks of coughing, some blood in sputum today.  The cough is minimally productive.    She has taken a  Z pak, breathing treatment, stress test, echocardiogram, codeine cough syrups.  She takes medicine for GERD,   Cough is made worse by movement and deep breathing  Did well on the peak flow and did not improve with  Breathing treatment.  PMHX:  GERD, diabetes (recent A1C was 7.0)  O:   Persistent violent cough HEENT:  clear Chest:  Few rales left base. Heart: regular, no murmur.  \ 1. Bronchitis  predniSONE (DELTASONE) 20 MG tablet, doxycycline (VIBRA-TABS) 100 MG tablet   If no better in 24 hours, CT angio to rule out PE

## 2013-10-29 ENCOUNTER — Other Ambulatory Visit: Payer: Self-pay | Admitting: Hematology & Oncology

## 2013-10-29 DIAGNOSIS — R300 Dysuria: Secondary | ICD-10-CM

## 2014-11-04 LAB — MICROALBUMIN, URINE: MICROALB UR: 24.8

## 2014-11-07 LAB — HM DEXA SCAN

## 2015-02-22 LAB — TSH: TSH: 1.68 u[IU]/mL (ref <=5.90)

## 2015-06-14 LAB — BASIC METABOLIC PANEL: GLUCOSE: 149 mg/dL

## 2015-06-14 LAB — CBC AND DIFFERENTIAL
NEUTROS ABS: 4 /uL
WBC: 7.4 10^3/mL

## 2015-06-14 LAB — HEPATIC FUNCTION PANEL: BILIRUBIN, TOTAL: 0.3 mg/dL

## 2015-07-27 ENCOUNTER — Encounter: Payer: Self-pay | Admitting: Podiatry

## 2015-07-27 ENCOUNTER — Ambulatory Visit (INDEPENDENT_AMBULATORY_CARE_PROVIDER_SITE_OTHER): Payer: PPO

## 2015-07-27 ENCOUNTER — Ambulatory Visit (INDEPENDENT_AMBULATORY_CARE_PROVIDER_SITE_OTHER): Payer: PPO | Admitting: Podiatry

## 2015-07-27 VITALS — BP 159/79 | HR 71 | Resp 16 | Ht 65.0 in | Wt 198.0 lb

## 2015-07-27 DIAGNOSIS — M722 Plantar fascial fibromatosis: Secondary | ICD-10-CM | POA: Diagnosis not present

## 2015-07-27 DIAGNOSIS — G629 Polyneuropathy, unspecified: Secondary | ICD-10-CM | POA: Diagnosis not present

## 2015-07-27 DIAGNOSIS — M79673 Pain in unspecified foot: Secondary | ICD-10-CM

## 2015-07-27 DIAGNOSIS — L84 Corns and callosities: Secondary | ICD-10-CM

## 2015-07-27 MED ORDER — GABAPENTIN 300 MG PO CAPS: 300.0000 mg | ORAL_CAPSULE | Freq: Three times a day (TID) | ORAL | Status: DC

## 2015-07-27 NOTE — Progress Notes (Signed)
   Subjective:    Patient ID: Karen Gonzales, female    DOB: 04-30-1946, 69 y.o.   MRN: [id number]  HPI Patient presents with foot pain in their left foot, heel.  Patient also presents with bilateral callouses - plantar forefoot.   Review of Systems  Eyes: Positive for visual disturbance.  Genitourinary: Positive for frequency.  Skin: Positive for rash.  All other systems reviewed and are negative.      Objective:   Physical Exam        Assessment & Plan:

## 2015-07-27 NOTE — Patient Instructions (Signed)

## 2015-07-28 NOTE — Progress Notes (Signed)
Subjective:     Patient ID: Karen Gonzales, female   DOB: 02-07-1946, 69 y.o.   MRN: [id number]  HPI patient presents with husband with discomfort in the plantar heel left that's moderate in intensity and significant keratotic lesion sub-third metatarsal both feet which can become painful. She tries to trim them with minimal success   Review of Systems  All other systems reviewed and are negative.      Objective:   Physical Exam  Constitutional: She is oriented to person, place, and time.  Cardiovascular: Intact distal pulses.   Musculoskeletal: Normal range of motion.  Neurological: She is oriented to person, place, and time.  Skin: Skin is warm and dry.  Nursing note and vitals reviewed.  neurovascular status found to be intact with muscle strength adequate range of motion within normal limits. Patient's noted to have mild discomfort in the plantar heel left moderate depression of the arch and keratotic lesion underneath the third metatarsal of both feet which can become painful at times and at times create difficulty with proper ambulation. Good digital perfusion is noted well oriented 3     Assessment:      mild plantar fasciitis left with inflammatory keratotic lesions with probable plantarflexed metatarsals bilateral    Plan:      H&P conditions reviewed and discussed shoe gear modifications. Debridement of lesions accomplished today with consideration that this can be done as needed in future and may require ultimate orthotics for surgery

## 2015-08-17 DIAGNOSIS — H5203 Hypermetropia, bilateral: Secondary | ICD-10-CM | POA: Diagnosis not present

## 2015-08-17 DIAGNOSIS — E119 Type 2 diabetes mellitus without complications: Secondary | ICD-10-CM | POA: Diagnosis not present

## 2015-08-17 DIAGNOSIS — H52223 Regular astigmatism, bilateral: Secondary | ICD-10-CM | POA: Diagnosis not present

## 2015-08-17 DIAGNOSIS — H524 Presbyopia: Secondary | ICD-10-CM | POA: Diagnosis not present

## 2015-09-09 LAB — HM MAMMOGRAPHY

## 2015-10-07 ENCOUNTER — Telehealth: Payer: Self-pay | Admitting: Family Medicine

## 2015-10-07 NOTE — Telephone Encounter (Signed)
Ok to establish 

## 2015-10-07 NOTE — Telephone Encounter (Signed)
Pt says that she is a current pt at [facility]. She says that Birdie Riddle sees a few of her family members. [place] She says that [place] okay with [place] location [place].   Please advise.   CB: [phone]

## 2015-10-10 NOTE — Telephone Encounter (Signed)
Called, scheduled pt.

## 2015-11-07 ENCOUNTER — Encounter: Payer: Self-pay | Admitting: Family Medicine

## 2015-11-07 ENCOUNTER — Ambulatory Visit (INDEPENDENT_AMBULATORY_CARE_PROVIDER_SITE_OTHER): Payer: PPO | Admitting: Family Medicine

## 2015-11-07 VITALS — BP 110/84 | HR 82 | Temp 98.1°F | Resp 16 | Ht 65.0 in | Wt 202.0 lb

## 2015-11-07 DIAGNOSIS — I10 Essential (primary) hypertension: Secondary | ICD-10-CM | POA: Diagnosis not present

## 2015-11-07 DIAGNOSIS — E119 Type 2 diabetes mellitus without complications: Secondary | ICD-10-CM

## 2015-11-07 DIAGNOSIS — E785 Hyperlipidemia, unspecified: Secondary | ICD-10-CM | POA: Insufficient documentation

## 2015-11-07 DIAGNOSIS — E1169 Type 2 diabetes mellitus with other specified complication: Secondary | ICD-10-CM | POA: Insufficient documentation

## 2015-11-07 DIAGNOSIS — E1140 Type 2 diabetes mellitus with diabetic neuropathy, unspecified: Secondary | ICD-10-CM | POA: Insufficient documentation

## 2015-11-07 LAB — CBC WITH DIFFERENTIAL/PLATELET
BASOS PCT: 0.8 % (ref 0.0–3.0)
Basophils Absolute: 0.1 10*3/uL (ref 0.0–0.1)
EOS PCT: 2.9 % (ref 0.0–5.0)
Eosinophils Absolute: 0.2 10*3/uL (ref 0.0–0.7)
HEMATOCRIT: 38.1 % (ref 36.0–46.0)
HEMOGLOBIN: 12.6 g/dL (ref 12.0–15.0)
LYMPHS PCT: 32.6 % (ref 12.0–46.0)
Lymphs Abs: 2.7 10*3/uL (ref 0.7–4.0)
MCHC: 33.1 g/dL (ref 30.0–36.0)
MCV: 83.5 fl (ref 78.0–100.0)
MONO ABS: 0.6 10*3/uL (ref 0.1–1.0)
MONOS PCT: 7.7 % (ref 3.0–12.0)
NEUTROS PCT: 56 % (ref 43.0–77.0)
Neutro Abs: 4.6 10*3/uL (ref 1.4–7.7)
Platelets: 252 10*3/uL (ref 150.0–400.0)
RBC: 4.56 Mil/uL (ref 3.87–5.11)
RDW: 13.5 % (ref 11.5–15.5)
WBC: 8.2 10*3/uL (ref 4.0–10.5)

## 2015-11-07 LAB — LIPID PANEL
Cholesterol: 204 mg/dL — ABNORMAL HIGH (ref 0–200)
HDL: 48 mg/dL (ref >39.00)
LDL Cholesterol: 124 mg/dL — ABNORMAL HIGH (ref 0–99)
NONHDL: 155.8
TRIGLYCERIDES: 160 mg/dL — AB (ref 0.0–149.0)
Total CHOL/HDL Ratio: 4
VLDL: 32 mg/dL (ref 0.0–40.0)

## 2015-11-07 LAB — HEPATIC FUNCTION PANEL
ALBUMIN: 4.5 g/dL (ref 3.5–5.2)
ALT: 50 U/L — ABNORMAL HIGH (ref 0–35)
AST: 37 U/L (ref 0–37)
Alkaline Phosphatase: 47 U/L (ref 39–117)
BILIRUBIN TOTAL: 0.4 mg/dL (ref 0.2–1.2)
Bilirubin, Direct: 0.1 mg/dL (ref 0.0–0.3)
Total Protein: 8.3 g/dL (ref 6.0–8.3)

## 2015-11-07 LAB — TSH: TSH: 2.28 u[IU]/mL (ref 0.35–4.50)

## 2015-11-07 LAB — BASIC METABOLIC PANEL
BUN: 15 mg/dL (ref 6–23)
CALCIUM: 9.6 mg/dL (ref 8.4–10.5)
CO2: 27 meq/L (ref 19–32)
Chloride: 98 mEq/L (ref 96–112)
Creatinine, Ser: 0.76 mg/dL (ref 0.40–1.20)
GFR: 80.33 mL/min (ref >60.00)
Glucose, Bld: 138 mg/dL — ABNORMAL HIGH (ref 70–99)
Potassium: 4.3 mEq/L (ref 3.5–5.1)
SODIUM: 134 meq/L — AB (ref 135–145)

## 2015-11-07 LAB — HEMOGLOBIN A1C: HEMOGLOBIN A1C: 8.3 % — AB (ref 4.6–6.5)

## 2015-11-07 NOTE — Progress Notes (Signed)
   Subjective:    Patient ID: Karen Gonzales, female    DOB: 1946-05-12, 70 y.o.   MRN: [id number]  HPI New to establish.  Previous MD- Dr Rex Kras  GIOletta Lamas (UTD on colonoscopy)  DM- chronic problem, on Metformin and Glimepiride.  Pt reports sugars have been higher since starting the Amaryl- fasting CBGs 180-200.  UTD on eye exam (Dr Sabra Heck)- no retinopathy.  On ARB for renal protection.  + neuropathy- seeing podiatry and on Gabapentin.  Denies symptomatic lows.  No abd pain, N/V.  HTN- chronic problem, on Amlodipine, Clonidine, Losartan w/ good control.  Denies CP, SOB, HAs, visual changes, edema.  Hyperlipidemia- ongoing issue.  Pt reports she has had elevated liver enzymes w/ statins.  Pt stopped taking medication b/c she feared liver complications.   Review of Systems For ROS see HPI     Objective:   Physical Exam  Constitutional: She is oriented to person, place, and time. She appears well-developed and well-nourished. No distress.  overweight  HENT:  Head: Normocephalic and atraumatic.  Eyes: Conjunctivae and EOM are normal. Pupils are equal, round, and reactive to light.  Neck: Normal range of motion. Neck supple. No thyromegaly present.  Cardiovascular: Normal rate, regular rhythm, normal heart sounds and intact distal pulses.   No murmur heard. Pulmonary/Chest: Effort normal and breath sounds normal. No respiratory distress.  Abdominal: Soft. She exhibits no distension. There is no tenderness.  Musculoskeletal: She exhibits no edema.  Lymphadenopathy:    She has no cervical adenopathy.  Neurological: She is alert and oriented to person, place, and time.  Skin: Skin is warm and dry.  Psychiatric: She has a normal mood and affect. Her behavior is normal.  Vitals reviewed.         Assessment & Plan:

## 2015-11-07 NOTE — Progress Notes (Signed)
Pre visit review using our clinic review tool, if applicable. No additional management support is needed unless otherwise documented below in the visit note. 

## 2015-11-07 NOTE — Patient Instructions (Signed)
Follow up in 3 months to recheck BP and diabetes We'll notify you of your lab results and make any changes if needed Continue to work on healthy diet and regular exercise- this is very important! Stop the PM dose of the clonidine for 1-2 weeks and then stop this completely Call with any questions or concerns Welcome!  We're glad to have you!!!

## 2015-11-08 NOTE — Assessment & Plan Note (Signed)
New to provider, ongoing for pt.  She reports that recent fasting sugars have been much higher than previously.  UTD on eye exam (last month), foot exam (w/ podiatry).  On ARB for renal protection.  Discussed need for low carb diet and regular exercise.  Check labs and adjust meds prn.  Pt expressed understanding and is in agreement w/ plan.

## 2015-11-08 NOTE — Assessment & Plan Note (Signed)
New to provider, ongoing for pt.  BP is excellently controlled and pt would like to wean off 1 of her meds if possible.  Will wean Clonidine and see if BP remains at goal.  Pt expressed understanding and is in agreement w/ plan.

## 2015-11-08 NOTE — Assessment & Plan Note (Signed)
New to provider, ongoing for pt.  She reports that she has not been able to tolerate statins in the past due to elevated LFTs.  Check labs to determine baseline and determine if medications are required.  Stressed need for healthy diet and regular exercise.  Will follow.

## 2015-11-28 ENCOUNTER — Telehealth: Payer: Self-pay | Admitting: Family Medicine

## 2015-11-28 MED ORDER — ONETOUCH DELICA LANCETS 33G MISC: Status: DC

## 2015-11-28 MED ORDER — ONETOUCH ULTRA BLUE VI STRP: ORAL_STRIP | Status: DC

## 2015-11-28 MED ORDER — LOSARTAN POTASSIUM 100 MG PO TABS: 100.0000 mg | ORAL_TABLET | Freq: Every day | ORAL | Status: DC

## 2015-11-28 NOTE — Telephone Encounter (Signed)
Medication filled to pharmacy as requested.   

## 2015-11-28 NOTE — Telephone Encounter (Signed)
Pt states that she needs refill on losartan, test strips, and lancets. Pt would like this to go to [facility] in [place].

## 2015-11-29 ENCOUNTER — Encounter: Payer: Self-pay | Admitting: General Practice

## 2015-12-04 LAB — HM DIABETES EYE EXAM

## 2015-12-15 ENCOUNTER — Telehealth: Payer: Self-pay | Admitting: Family Medicine

## 2015-12-15 MED ORDER — METFORMIN HCL 500 MG PO TABS: 500.0000 mg | ORAL_TABLET | Freq: Two times a day (BID) | ORAL | Status: DC

## 2015-12-15 NOTE — Telephone Encounter (Signed)
Pt needs refill on metformin, [facility] [place]. Pt asking if this can be for a 90 day supply due to a cheaper cost with insurance

## 2015-12-15 NOTE — Telephone Encounter (Signed)
Medication filled to pharmacy as requested.   

## 2015-12-17 DIAGNOSIS — M25511 Pain in right shoulder: Secondary | ICD-10-CM | POA: Diagnosis not present

## 2016-02-03 ENCOUNTER — Ambulatory Visit (INDEPENDENT_AMBULATORY_CARE_PROVIDER_SITE_OTHER): Payer: PPO | Admitting: Family Medicine

## 2016-02-03 ENCOUNTER — Encounter: Payer: Self-pay | Admitting: Family Medicine

## 2016-02-03 VITALS — BP 138/89 | HR 90 | Temp 98.2°F | Resp 16 | Ht 65.0 in | Wt 199.5 lb

## 2016-02-03 DIAGNOSIS — E119 Type 2 diabetes mellitus without complications: Secondary | ICD-10-CM | POA: Diagnosis not present

## 2016-02-03 DIAGNOSIS — M25511 Pain in right shoulder: Secondary | ICD-10-CM | POA: Insufficient documentation

## 2016-02-03 DIAGNOSIS — I10 Essential (primary) hypertension: Secondary | ICD-10-CM

## 2016-02-03 DIAGNOSIS — E785 Hyperlipidemia, unspecified: Secondary | ICD-10-CM

## 2016-02-03 LAB — BASIC METABOLIC PANEL
BUN: 12 mg/dL (ref 6–23)
CHLORIDE: 99 meq/L (ref 96–112)
CO2: 26 meq/L (ref 19–32)
Calcium: 9.5 mg/dL (ref 8.4–10.5)
Creatinine, Ser: 0.7 mg/dL (ref 0.40–1.20)
GFR: 88.27 mL/min (ref >60.00)
GLUCOSE: 155 mg/dL — AB (ref 70–99)
POTASSIUM: 4.6 meq/L (ref 3.5–5.1)
SODIUM: 133 meq/L — AB (ref 135–145)

## 2016-02-03 LAB — LIPID PANEL
CHOLESTEROL: 194 mg/dL (ref 0–200)
HDL: 50.3 mg/dL (ref >39.00)
LDL CALC: 116 mg/dL — AB (ref 0–99)
NonHDL: 143.61
Total CHOL/HDL Ratio: 4
Triglycerides: 140 mg/dL (ref 0.0–149.0)
VLDL: 28 mg/dL (ref 0.0–40.0)

## 2016-02-03 LAB — CBC WITH DIFFERENTIAL/PLATELET
Basophils Absolute: 0.2 10*3/uL — ABNORMAL HIGH (ref 0.0–0.1)
Basophils Relative: 2.7 % (ref 0.0–3.0)
EOS PCT: 2.3 % (ref 0.0–5.0)
Eosinophils Absolute: 0.2 10*3/uL (ref 0.0–0.7)
HCT: 34.6 % — ABNORMAL LOW (ref 36.0–46.0)
Hemoglobin: 11.4 g/dL — ABNORMAL LOW (ref 12.0–15.0)
LYMPHS ABS: 2.7 10*3/uL (ref 0.7–4.0)
Lymphocytes Relative: 34.7 % (ref 12.0–46.0)
MCHC: 32.9 g/dL (ref 30.0–36.0)
MCV: 80.8 fl (ref 78.0–100.0)
MONO ABS: 0.6 10*3/uL (ref 0.1–1.0)
Monocytes Relative: 8.3 % (ref 3.0–12.0)
NEUTROS PCT: 52 % (ref 43.0–77.0)
Neutro Abs: 4 10*3/uL (ref 1.4–7.7)
Platelets: 225 10*3/uL (ref 150.0–400.0)
RBC: 4.28 Mil/uL (ref 3.87–5.11)
RDW: 13.6 % (ref 11.5–15.5)
WBC: 7.7 10*3/uL (ref 4.0–10.5)

## 2016-02-03 LAB — HEPATIC FUNCTION PANEL
ALBUMIN: 4.3 g/dL (ref 3.5–5.2)
ALK PHOS: 48 U/L (ref 39–117)
ALT: 58 U/L — AB (ref 0–35)
AST: 45 U/L — AB (ref 0–37)
Bilirubin, Direct: 0.1 mg/dL (ref 0.0–0.3)
Total Bilirubin: 0.4 mg/dL (ref 0.2–1.2)
Total Protein: 7.4 g/dL (ref 6.0–8.3)

## 2016-02-03 LAB — HEMOGLOBIN A1C: HEMOGLOBIN A1C: 8.8 % — AB (ref 4.6–6.5)

## 2016-02-03 MED ORDER — GABAPENTIN 300 MG PO CAPS: 300.0000 mg | ORAL_CAPSULE | Freq: Three times a day (TID) | ORAL | Status: DC

## 2016-02-03 MED ORDER — ONETOUCH ULTRA 2 W/DEVICE KIT: PACK | Status: AC

## 2016-02-03 MED ORDER — METFORMIN HCL 500 MG PO TABS: 1000.0000 mg | ORAL_TABLET | Freq: Two times a day (BID) | ORAL | Status: DC

## 2016-02-03 NOTE — Assessment & Plan Note (Signed)
New to provider.  Pt reports 1 month of pain w/ limited ROM.  Pt has orthopedic doctor- Dr French Ana- encouraged her to call him.  Pt expressed understanding and is in agreement w/ plan.

## 2016-02-03 NOTE — Progress Notes (Signed)
Pre visit review using our clinic review tool, if applicable. No additional management support is needed unless otherwise documented below in the visit note. 

## 2016-02-03 NOTE — Assessment & Plan Note (Signed)
Chronic problem.  Not currently on statin due to hx of elevated LFTs.  Repeat cholesterol and LFTs today and start medication if needed/able.  Pt expressed understanding and is in agreement w/ plan.

## 2016-02-03 NOTE — Assessment & Plan Note (Signed)
Chronic problem.  Pt is tolerating Metformin w/o difficulty.  Prescription corrected and resent w/ correct # of pills.  UTD on eye exam, on ARB for renal protection.  Foot exam done today.  Check labs.  Adjust meds prn

## 2016-02-03 NOTE — Assessment & Plan Note (Signed)
Chronic problem.  BP is elevated today.  Pt feels this may be pain related.  Her BP did improve at end of OV.  Asymptomatic at this time.  Will continue to follow.

## 2016-02-03 NOTE — Progress Notes (Signed)
   Subjective:    Patient ID: Karen Gonzales, female    DOB: [birth year], 70 y.o.   MRN: [id number]  HPI HTN- chronic problem.  BP is quite elevated today.  Pt states this is b/c she's in pain due to R arm.  She is on Losartan, Amlodipine, Clonidine BID.  No CP, SOB, HAs, visual changes.  Hyperlipidemia- chronic problem, not currently on statin due to hx of liver enzyme elevation.  No abd pain, N/V.  DM- chronic problem, on Metformin 1037m BID.  UTD on eye exam.  On ARB.  Due for foot exam.  Denies symptomatic lows.  Home CBGS 170-200s.  Pt reports Glimepiride caused her sugar to go up.  R shoulder pain- pt is having difficulty w/ internal and external rotation for ~1 month.  Went to UC and was started on Tramadol and Meloxicam.  Pt has seen Dr CFrench Anain the past.     Review of Systems For ROS see HPI     Objective:   Physical Exam  Constitutional: She is oriented to person, place, and time. She appears well-developed and well-nourished. No distress.  HENT:  Head: Normocephalic and atraumatic.  Eyes: Conjunctivae and EOM are normal. Pupils are equal, round, and reactive to light.  Neck: Normal range of motion. Neck supple. No thyromegaly present.  Cardiovascular: Normal rate, regular rhythm, normal heart sounds and intact distal pulses.   No murmur heard. Pulmonary/Chest: Effort normal and breath sounds normal. No respiratory distress.  Abdominal: Soft. She exhibits no distension. There is no tenderness.  Musculoskeletal: She exhibits no edema.  Limited internal and external rotation of R shoulder.  Pt must circumduct to achieve forward flexion  Lymphadenopathy:    She has no cervical adenopathy.  Neurological: She is alert and oriented to person, place, and time.  Skin: Skin is warm and dry.  Psychiatric: She has a normal mood and affect. Her behavior is normal.  Vitals reviewed.         Assessment & Plan:

## 2016-02-03 NOTE — Patient Instructions (Signed)
Schedule your complete physical in 3-4 months We'll notify you of your lab results and make any changes if needed Please call your orthopedic doctor (Dr French Ana) about your shoulder pain- this needs to be evaluated Continue to make healthy food choices and try and get regular exercise Call with any questions or concerns Hang in there! Have a great summer!

## 2016-02-06 ENCOUNTER — Other Ambulatory Visit: Payer: Self-pay | Admitting: Family Medicine

## 2016-02-06 DIAGNOSIS — R7989 Other specified abnormal findings of blood chemistry: Secondary | ICD-10-CM

## 2016-02-06 DIAGNOSIS — R945 Abnormal results of liver function studies: Principal | ICD-10-CM

## 2016-02-06 MED ORDER — SITAGLIPTIN PHOSPHATE 100 MG PO TABS: 100.0000 mg | ORAL_TABLET | Freq: Every day | ORAL | Status: DC

## 2016-02-08 ENCOUNTER — Ambulatory Visit
Admission: RE | Admit: 2016-02-08 | Discharge: 2016-02-08 | Disposition: A | Discharge status: Home / Self Care | Payer: PPO | Source: Ambulatory Visit | Attending: Family Medicine | Admitting: Family Medicine

## 2016-02-08 DIAGNOSIS — K760 Fatty (change of) liver, not elsewhere classified: Secondary | ICD-10-CM | POA: Diagnosis not present

## 2016-02-08 DIAGNOSIS — R945 Abnormal results of liver function studies: Principal | ICD-10-CM

## 2016-02-08 DIAGNOSIS — R7989 Other specified abnormal findings of blood chemistry: Secondary | ICD-10-CM

## 2016-02-08 NOTE — Progress Notes (Signed)
Quick Note:  Called pt and lmovm to return call. ______ 

## 2016-02-27 DIAGNOSIS — M25511 Pain in right shoulder: Secondary | ICD-10-CM | POA: Diagnosis not present

## 2016-03-12 DIAGNOSIS — M25511 Pain in right shoulder: Secondary | ICD-10-CM | POA: Diagnosis not present

## 2016-03-21 DIAGNOSIS — M65312 Trigger thumb, left thumb: Secondary | ICD-10-CM | POA: Diagnosis not present

## 2016-03-27 ENCOUNTER — Telehealth: Payer: Self-pay | Admitting: Family Medicine

## 2016-03-27 MED ORDER — AMLODIPINE BESYLATE 5 MG PO TABS: 5.0000 mg | ORAL_TABLET | Freq: Every morning | ORAL | 1 refills | Status: DC

## 2016-03-27 NOTE — Telephone Encounter (Signed)
Pt states she needs refill on Amlodipine and would like a 90 day supply, [facility] [place]

## 2016-03-27 NOTE — Telephone Encounter (Signed)
Medication filled to pharmacy as requested.   

## 2016-04-02 DIAGNOSIS — M25511 Pain in right shoulder: Secondary | ICD-10-CM | POA: Diagnosis not present

## 2016-04-30 ENCOUNTER — Other Ambulatory Visit: Payer: Self-pay | Admitting: General Practice

## 2016-04-30 MED ORDER — GABAPENTIN 300 MG PO CAPS: 300.0000 mg | ORAL_CAPSULE | Freq: Three times a day (TID) | ORAL | 1 refills | Status: DC

## 2016-05-10 ENCOUNTER — Other Ambulatory Visit: Payer: Self-pay | Admitting: General Practice

## 2016-05-10 MED ORDER — LOSARTAN POTASSIUM 100 MG PO TABS: 100.0000 mg | ORAL_TABLET | Freq: Every day | ORAL | 1 refills | Status: DC

## 2016-05-14 ENCOUNTER — Ambulatory Visit (INDEPENDENT_AMBULATORY_CARE_PROVIDER_SITE_OTHER): Payer: PPO

## 2016-05-14 DIAGNOSIS — Z23 Encounter for immunization: Secondary | ICD-10-CM | POA: Diagnosis not present

## 2016-05-23 ENCOUNTER — Other Ambulatory Visit: Payer: Self-pay | Admitting: Emergency Medicine

## 2016-05-23 MED ORDER — LOSARTAN POTASSIUM 100 MG PO TABS: 100.0000 mg | ORAL_TABLET | Freq: Every day | ORAL | 1 refills | Status: DC

## 2016-05-29 ENCOUNTER — Other Ambulatory Visit: Payer: Self-pay | Admitting: Family Medicine

## 2016-06-04 ENCOUNTER — Telehealth: Payer: Self-pay | Admitting: Family Medicine

## 2016-06-04 NOTE — Telephone Encounter (Signed)
Pt called asking since she has a f/u appt for tomorrow can KT see her for a cough as well.

## 2016-06-04 NOTE — Telephone Encounter (Signed)
Ok to discuss cough at f/u appt tomorrow

## 2016-06-05 ENCOUNTER — Ambulatory Visit (INDEPENDENT_AMBULATORY_CARE_PROVIDER_SITE_OTHER): Payer: PPO | Admitting: Family Medicine

## 2016-06-05 ENCOUNTER — Encounter: Payer: Self-pay | Admitting: Family Medicine

## 2016-06-05 VITALS — BP 120/72 | HR 97 | Temp 98.8°F | Resp 18 | Ht 65.0 in | Wt 195.2 lb

## 2016-06-05 DIAGNOSIS — E119 Type 2 diabetes mellitus without complications: Secondary | ICD-10-CM | POA: Diagnosis not present

## 2016-06-05 DIAGNOSIS — E1143 Type 2 diabetes mellitus with diabetic autonomic (poly)neuropathy: Secondary | ICD-10-CM

## 2016-06-05 DIAGNOSIS — K3184 Gastroparesis: Secondary | ICD-10-CM | POA: Diagnosis not present

## 2016-06-05 LAB — BASIC METABOLIC PANEL
BUN: 14 mg/dL (ref 6–23)
CO2: 25 meq/L (ref 19–32)
Calcium: 9.7 mg/dL (ref 8.4–10.5)
Chloride: 98 mEq/L (ref 96–112)
Creatinine, Ser: 0.82 mg/dL (ref 0.40–1.20)
GFR: 73.46 mL/min (ref >60.00)
GLUCOSE: 181 mg/dL — AB (ref 70–99)
POTASSIUM: 4.4 meq/L (ref 3.5–5.1)
SODIUM: 134 meq/L — AB (ref 135–145)

## 2016-06-05 LAB — HEMOGLOBIN A1C: Hgb A1c MFr Bld: 7.9 % — ABNORMAL HIGH (ref 4.6–6.5)

## 2016-06-05 MED ORDER — ALBUTEROL SULFATE HFA 108 (90 BASE) MCG/ACT IN AERS: 2.0000 | INHALATION_SPRAY | Freq: Four times a day (QID) | RESPIRATORY_TRACT | 2 refills | Status: DC | PRN

## 2016-06-05 MED ORDER — IPRATROPIUM-ALBUTEROL 0.5-2.5 (3) MG/3ML IN SOLN
3.0000 mL | Freq: Once | RESPIRATORY_TRACT | Status: AC
  Administered 2016-06-05: 3 mL via RESPIRATORY_TRACT

## 2016-06-05 MED ORDER — OMEPRAZOLE 40 MG PO CPDR: 40.0000 mg | DELAYED_RELEASE_CAPSULE | Freq: Two times a day (BID) | ORAL | 1 refills | Status: DC

## 2016-06-05 NOTE — Assessment & Plan Note (Signed)
Chronic problem.  UTD on foot exam, eye exam.  On ARB for renal protection.  Januvia was added at last visit and other than the cost of the medication, pt reports things are going well.  Check labs.  Adjust meds prn

## 2016-06-05 NOTE — Progress Notes (Signed)
Pre visit review using our clinic review tool, if applicable. No additional management support is needed unless otherwise documented below in the visit note. 

## 2016-06-05 NOTE — Assessment & Plan Note (Signed)
New to provider.  Pt reports she was dx'd w/ gastroparesis by Dr Oletta Lamas when she had similar coughing spells years ago.  sxs resolved w/ Reglan but she subsequently developed tremors and was unable to continue on the medication.  Will increase her Omeprazole to BID to decrease acid production and limit this as possible cause of cough.  Refer back to GI for management of gastroparesis.  If cough doesn't improve, will need pulmonary referral- although pt states she did this previously and declines at this time.  Will follow.

## 2016-06-05 NOTE — Progress Notes (Signed)
   Subjective:    Patient ID: Karen Gonzales, female    DOB: [birth year], 70 y.o.   MRN: [id number]  HPI DM- chronic problem.  Currently on Metformin and Januvia (added at last visit).  On ARB for renal protection.  UTD on foot and eye exam.  Denies symptomatic lows.  Pt reports CBGs have been elevated this week.  No numbness/tingling of hands/feet.  Denies CP, SOB (except during coughing fit)  Cough- pt reports she has been coughing x1 month.  Pt has hx of similar and was told this was due to her gastroparesis and was started on Reglan (Dr Oletta Lamas).  Once she started Reglan, cough improved but she developed tremors and is not able to take this.  Continues to take Omeprazole 61m daily.  No fevers.  Denies sinus pain/pressure, nasal congestion.     Review of Systems For ROS see HPI     Objective:   Physical Exam  Constitutional: She is oriented to person, place, and time. She appears well-developed and well-nourished. No distress.  HENT:  Head: Normocephalic and atraumatic.  Eyes: Conjunctivae and EOM are normal. Pupils are equal, round, and reactive to light.  Neck: Normal range of motion. Neck supple. No thyromegaly present.  Cardiovascular: Normal rate, regular rhythm, normal heart sounds and intact distal pulses.   No murmur heard. Pulmonary/Chest: Effort normal and breath sounds normal. No respiratory distress.  Barking, hacking, near continuous cough- only minimal improvement w/ neb tx in office  Abdominal: Soft. She exhibits no distension. There is no tenderness.  Musculoskeletal: She exhibits no edema.  Lymphadenopathy:    She has no cervical adenopathy.  Neurological: She is alert and oriented to person, place, and time.  Skin: Skin is warm and dry.  Psychiatric: She has a normal mood and affect. Her behavior is normal.          Assessment & Plan:

## 2016-06-05 NOTE — Patient Instructions (Addendum)
Schedule your complete physical in 3-4 months We'll notify you of your lab results and make any changes if needed Increase the Omeprazole to twice daily We'll call you with your appt to see Dr Oletta Lamas Use the Albuterol inhaler- 2 puffs every 4 hrs as needed for cough Call with any questions or concerns Hang in there!!!

## 2016-06-08 ENCOUNTER — Telehealth: Payer: Self-pay | Admitting: Family Medicine

## 2016-06-08 MED ORDER — GLIPIZIDE ER 5 MG PO TB24: 5.0000 mg | ORAL_TABLET | Freq: Every day | ORAL | 6 refills | Status: DC

## 2016-06-08 NOTE — Telephone Encounter (Signed)
Pt informed and med filled to pharmacy as requested.

## 2016-06-08 NOTE — Telephone Encounter (Signed)
None of these medications are remotely similar to the [place].  Pioglitazone is associated w/ weight gain and the other medications run the risk of severe hypoglycemia (low blood sugar).  Since the Januvia is too expensive, we can switch to Glucotrol XL 5 mg daily

## 2016-06-08 NOTE — Telephone Encounter (Signed)
Pt states that in place of [place] ins will cover these Rx pioglitazone,tolazamide,and tolbietamide. Pt states any of these will be fine for her to try.

## 2016-06-19 DIAGNOSIS — K3184 Gastroparesis: Secondary | ICD-10-CM | POA: Diagnosis not present

## 2016-06-19 DIAGNOSIS — E119 Type 2 diabetes mellitus without complications: Secondary | ICD-10-CM | POA: Diagnosis not present

## 2016-06-19 DIAGNOSIS — K219 Gastro-esophageal reflux disease without esophagitis: Secondary | ICD-10-CM | POA: Diagnosis not present

## 2016-06-19 DIAGNOSIS — R05 Cough: Secondary | ICD-10-CM | POA: Diagnosis not present

## 2016-07-23 DIAGNOSIS — K3184 Gastroparesis: Secondary | ICD-10-CM | POA: Diagnosis not present

## 2016-07-23 DIAGNOSIS — R05 Cough: Secondary | ICD-10-CM | POA: Diagnosis not present

## 2016-07-24 ENCOUNTER — Other Ambulatory Visit: Payer: Self-pay | Admitting: Family Medicine

## 2016-08-10 ENCOUNTER — Other Ambulatory Visit: Payer: Self-pay | Admitting: General Practice

## 2016-08-10 MED ORDER — GLIPIZIDE ER 5 MG PO TB24: 5.0000 mg | ORAL_TABLET | Freq: Every day | ORAL | 1 refills | Status: DC

## 2016-09-17 ENCOUNTER — Other Ambulatory Visit: Payer: Self-pay | Admitting: Family Medicine

## 2016-09-17 DIAGNOSIS — Z1231 Encounter for screening mammogram for malignant neoplasm of breast: Secondary | ICD-10-CM

## 2016-09-24 ENCOUNTER — Other Ambulatory Visit: Payer: Self-pay | Admitting: Family Medicine

## 2016-09-28 ENCOUNTER — Ambulatory Visit (INDEPENDENT_AMBULATORY_CARE_PROVIDER_SITE_OTHER): Payer: PPO | Admitting: Podiatry

## 2016-09-28 ENCOUNTER — Ambulatory Visit
Admission: RE | Admit: 2016-09-28 | Discharge: 2016-09-28 | Disposition: A | Discharge status: Home / Self Care | Payer: PPO | Source: Ambulatory Visit | Attending: Family Medicine | Admitting: Family Medicine

## 2016-09-28 ENCOUNTER — Ambulatory Visit (INDEPENDENT_AMBULATORY_CARE_PROVIDER_SITE_OTHER): Payer: PPO

## 2016-09-28 DIAGNOSIS — L84 Corns and callosities: Secondary | ICD-10-CM

## 2016-09-28 DIAGNOSIS — M779 Enthesopathy, unspecified: Secondary | ICD-10-CM

## 2016-09-28 DIAGNOSIS — M722 Plantar fascial fibromatosis: Secondary | ICD-10-CM | POA: Diagnosis not present

## 2016-09-28 DIAGNOSIS — Z1231 Encounter for screening mammogram for malignant neoplasm of breast: Secondary | ICD-10-CM

## 2016-09-28 DIAGNOSIS — M7662 Achilles tendinitis, left leg: Secondary | ICD-10-CM

## 2016-09-28 MED ORDER — TRIAMCINOLONE ACETONIDE 10 MG/ML IJ SUSP
10.0000 mg | Freq: Once | INTRAMUSCULAR | Status: AC
  Administered 2016-09-28: 10 mg

## 2016-09-29 NOTE — Progress Notes (Signed)
Subjective:     Patient ID: Karen Gonzales, female   DOB: October 24, 1945, 71 y.o.   MRN: [id number]  HPI patient presents stating I have developed pain on the outside of the left foot and I have a lesion that's painful when I walk it's making it difficult to walk comfortably. Patient states that the Achilles tendon is doing pretty well   Review of Systems     Objective:   Physical Exam Neurovascular status intact negative Homans sign was noted with pain in the lateral side of the left foot at the insertion of the peroneal tendon with inflammation noted and posterior pain left heel which is improved dramatically. Patient does have keratotic lesion with inflammation    Assessment:     Several problems of 1 is tendinitis lateral left foot with the second being Achilles tendinitis and keratotic lesion formation    Plan:     H&P all conditions reviewed and careful injection administered lateral side left peroneal tendon at the insertion with brace administered to lift the lateral foot. Instructed on ice continue the stretching exercises for the Achilles with education rendered today and debridement of lesion and reappoint as needed

## 2016-10-10 ENCOUNTER — Ambulatory Visit (INDEPENDENT_AMBULATORY_CARE_PROVIDER_SITE_OTHER): Payer: PPO | Admitting: Family Medicine

## 2016-10-10 ENCOUNTER — Encounter: Payer: Self-pay | Admitting: Family Medicine

## 2016-10-10 VITALS — BP 130/81 | HR 75 | Temp 98.1°F | Resp 16 | Ht 65.0 in | Wt 194.0 lb

## 2016-10-10 DIAGNOSIS — Z0000 Encounter for general adult medical examination without abnormal findings: Secondary | ICD-10-CM | POA: Diagnosis not present

## 2016-10-10 DIAGNOSIS — E119 Type 2 diabetes mellitus without complications: Secondary | ICD-10-CM

## 2016-10-10 DIAGNOSIS — I10 Essential (primary) hypertension: Secondary | ICD-10-CM

## 2016-10-10 DIAGNOSIS — E785 Hyperlipidemia, unspecified: Secondary | ICD-10-CM

## 2016-10-10 LAB — HEPATIC FUNCTION PANEL
ALK PHOS: 49 U/L (ref 33–130)
ALT: 43 U/L — ABNORMAL HIGH (ref 6–29)
AST: 31 U/L (ref 10–35)
Albumin: 4.4 g/dL (ref 3.6–5.1)
BILIRUBIN DIRECT: 0.1 mg/dL (ref <=0.2)
BILIRUBIN INDIRECT: 0.4 mg/dL (ref 0.2–1.2)
Total Bilirubin: 0.5 mg/dL (ref 0.2–1.2)
Total Protein: 7.8 g/dL (ref 6.1–8.1)

## 2016-10-10 LAB — BASIC METABOLIC PANEL
BUN: 17 mg/dL (ref 7–25)
CO2: 24 mmol/L (ref 20–31)
Calcium: 9.9 mg/dL (ref 8.6–10.4)
Chloride: 97 mmol/L — ABNORMAL LOW (ref 98–110)
Creat: 0.8 mg/dL (ref 0.50–0.99)
Glucose, Bld: 83 mg/dL (ref 65–99)
POTASSIUM: 4.6 mmol/L (ref 3.5–5.3)
SODIUM: 133 mmol/L — AB (ref 135–146)

## 2016-10-10 LAB — HEMOGLOBIN A1C
Hgb A1c MFr Bld: 7.4 % — ABNORMAL HIGH (ref <5.7)
MEAN PLASMA GLUCOSE: 166 mg/dL

## 2016-10-10 LAB — LIPID PANEL
CHOL/HDL RATIO: 3.5 ratio (ref <5.0)
Cholesterol: 194 mg/dL (ref <200)
HDL: 56 mg/dL (ref >50)
LDL CALC: 106 mg/dL — AB (ref <100)
Triglycerides: 158 mg/dL — ABNORMAL HIGH (ref <150)
VLDL: 32 mg/dL — ABNORMAL HIGH (ref <30)

## 2016-10-10 LAB — CBC WITH DIFFERENTIAL/PLATELET
BASOS ABS: 109 {cells}/uL (ref 0–200)
BASOS PCT: 1 %
EOS ABS: 218 {cells}/uL (ref 15–500)
Eosinophils Relative: 2 %
HEMATOCRIT: 36.1 % (ref 35.0–45.0)
HEMOGLOBIN: 11.7 g/dL (ref 11.7–15.5)
Lymphocytes Relative: 37 %
Lymphs Abs: 4033 cells/uL — ABNORMAL HIGH (ref 850–3900)
MCH: 26.4 pg — AB (ref 27.0–33.0)
MCHC: 32.4 g/dL (ref 32.0–36.0)
MCV: 81.3 fL (ref 80.0–100.0)
MONO ABS: 872 {cells}/uL (ref 200–950)
MONOS PCT: 8 %
MPV: 10.4 fL (ref 7.5–12.5)
NEUTROS ABS: 5668 {cells}/uL (ref 1500–7800)
Neutrophils Relative %: 52 %
PLATELETS: 293 10*3/uL (ref 140–400)
RBC: 4.44 MIL/uL (ref 3.80–5.10)
RDW: 15.1 % — ABNORMAL HIGH (ref 11.0–15.0)
WBC: 10.9 10*3/uL — ABNORMAL HIGH (ref 3.8–10.8)

## 2016-10-10 LAB — TSH: TSH: 1.4 m[IU]/L

## 2016-10-10 NOTE — Progress Notes (Signed)
   Subjective:    Patient ID: Karen Gonzales, female    DOB: Jan 03, 1946, 71 y.o.   MRN: [id number]  HPI Here today for CPE.  Risk Factors: DM- chronic problem, on Metformin, Glipizide.  UTD on eye exam, foot exam, on ARB for renal protection HTN- chronic problem, on Amlodipine, Losartan w/ good control Hyperlipidemia- chronic problem, currently attempting to control w/ diet and exercise but admits to doing poorly on both. Physical Activity: no regular exercise Fall Risk: low Depression: denies Hearing: normal to conversational tones, whispered voice at 6 ft ADL's: independent Cognitive: normal linear thought process, memory and attention intact Home Safety: safe at home, lives w/ husband Height, Weight, BMI, Visual Acuity: see vitals, vision corrected to 20/20 w/ glasses Counseling: UTD on mammo, colonoscopy (due next year), UTD on immunizations Care team reviewed and updated w/ pt Labs Ordered: See A&P Care Plan: See A&P    Review of Systems Patient reports no vision/ hearing changes, adenopathy,fever, weight change,  persistant/recurrent hoarseness , swallowing issues, chest pain, palpitations, edema, persistant/recurrent cough, hemoptysis, dyspnea (rest/exertional/paroxysmal nocturnal), gastrointestinal bleeding (melena, rectal bleeding), abdominal pain, significant heartburn, bowel changes, GU symptoms (dysuria, hematuria, incontinence), Gyn symptoms (abnormal  bleeding, pain),  syncope, focal weakness, memory loss, numbness & tingling, skin/hair/nail changes, abnormal bruising or bleeding, anxiety, or depression.     Objective:   Physical Exam General Appearance:    Alert, cooperative, no distress, appears stated age, obese  Head:    Normocephalic, without obvious abnormality, atraumatic  Eyes:    PERRL, conjunctiva/corneas clear, EOM's intact, fundi    benign, both eyes  Ears:    Normal TM's and external ear canals, both ears  Nose:   Nares normal, septum midline, mucosa  normal, no drainage    or sinus tenderness  Throat:   Lips, mucosa, and tongue normal; teeth and gums normal  Neck:   Supple, symmetrical, trachea midline, no adenopathy;    Thyroid: no enlargement/tenderness/nodules  Back:     Symmetric, no curvature, ROM normal, no CVA tenderness  Lungs:     Clear to auscultation bilaterally, respirations unlabored  Chest Wall:    No tenderness or deformity   Heart:    Regular rate and rhythm, S1 and S2 normal, no murmur, rub   or gallop  Breast Exam:    Deferred to mammo  Abdomen:     Soft, non-tender, bowel sounds active all four quadrants,    no masses, no organomegaly  Genitalia:    Deferred to GYN  Rectal:    Extremities:   Extremities normal, atraumatic, no cyanosis or edema  Pulses:   2+ and symmetric all extremities  Skin:   Skin color, texture, turgor normal, no rashes or lesions  Lymph nodes:   Cervical, supraclavicular, and axillary nodes normal  Neurologic:   CNII-XII intact, normal strength, sensation and reflexes    throughout          Assessment & Plan:

## 2016-10-10 NOTE — Assessment & Plan Note (Signed)
Pt's PE WNL w/ exception of obesity.  UTD on mammo, colonoscopy, immunizations.  Written screening schedule updated and given to pt.  Check labs.  Anticipatory guidance provided.

## 2016-10-10 NOTE — Assessment & Plan Note (Signed)
Chronic problem.  Currently well controlled.  Asymptomatic.  Check labs.  No anticipated med changes.

## 2016-10-10 NOTE — Assessment & Plan Note (Signed)
Chronic problem.  On Metformin and Glipizide.  UTD on eye exam, foot exam, and on ARB for renal protection.  Stressed need for low carb diet and regular exercise.  Check labs.  Adjust meds prn

## 2016-10-10 NOTE — Patient Instructions (Addendum)
Follow up in 3-4 months to recheck diabetes We'll notify you of your lab results and make any changes if needed Continue to work on healthy diet and regular exercise- you can do it!!! You are up to date on mammo, colonoscopy (due next year) and immunizations- yay!!! Start a daily allergy medication to help w/ your symptoms Call with any questions or concerns Happy Spring!!!

## 2016-10-10 NOTE — Progress Notes (Signed)
Pre visit review using our clinic review tool, if applicable. No additional management support is needed unless otherwise documented below in the visit note. 

## 2016-10-10 NOTE — Assessment & Plan Note (Signed)
Chronic problem.  Currently attempting to control w/ diet and exercise.  Not on meds at this time.  Check labs.  Start meds prn.

## 2016-10-11 ENCOUNTER — Encounter: Payer: Self-pay | Admitting: General Practice

## 2016-11-25 ENCOUNTER — Other Ambulatory Visit: Payer: Self-pay | Admitting: Family Medicine

## 2016-11-26 ENCOUNTER — Encounter: Payer: Self-pay | Admitting: Podiatry

## 2016-11-26 ENCOUNTER — Ambulatory Visit (INDEPENDENT_AMBULATORY_CARE_PROVIDER_SITE_OTHER): Payer: PPO | Admitting: Podiatry

## 2016-11-26 DIAGNOSIS — M7662 Achilles tendinitis, left leg: Secondary | ICD-10-CM

## 2016-11-26 DIAGNOSIS — E119 Type 2 diabetes mellitus without complications: Secondary | ICD-10-CM | POA: Diagnosis not present

## 2016-11-26 DIAGNOSIS — M722 Plantar fascial fibromatosis: Secondary | ICD-10-CM | POA: Diagnosis not present

## 2016-11-26 DIAGNOSIS — H524 Presbyopia: Secondary | ICD-10-CM | POA: Diagnosis not present

## 2016-11-26 DIAGNOSIS — H5203 Hypermetropia, bilateral: Secondary | ICD-10-CM | POA: Diagnosis not present

## 2016-11-26 LAB — HM DIABETES EYE EXAM

## 2016-11-26 MED ORDER — TRIAMCINOLONE ACETONIDE 10 MG/ML IJ SUSP
10.0000 mg | Freq: Once | INTRAMUSCULAR | Status: AC
  Administered 2016-11-26: 10 mg

## 2016-11-26 NOTE — Progress Notes (Signed)
Subjective:     Patient ID: Karen Gonzales, female   DOB: 1945-09-02, 71 y.o.   MRN: [id number]  HPI patient states she still pain in the outside of her left foot but most the pain is now centered in the medial band of the left heel with pain and pressure noted   Review of Systems     Objective:   Physical Exam Neurovascular status intact with patient found to have exquisite discomfort plantar aspect left heel with mild discomfort posterior posterior and lateral side of the left heel with inflammation noted    Assessment:     Achilles tendinitis lateral tendinitis present but most likely due to plantar fasciitis with pain in the medial band    Plan:     H&P condition reviewed and carefully administered cortisone injection 3 mg Kenalog 5 mill grams like plantarly and advised on stretching exercises ice therapy and reappoint for [place] to recheck again in the next 4 weeks

## 2016-12-10 ENCOUNTER — Ambulatory Visit (INDEPENDENT_AMBULATORY_CARE_PROVIDER_SITE_OTHER): Payer: PPO | Admitting: Podiatry

## 2016-12-10 ENCOUNTER — Encounter: Payer: Self-pay | Admitting: Podiatry

## 2016-12-10 ENCOUNTER — Encounter: Payer: Self-pay | Admitting: General Practice

## 2016-12-10 DIAGNOSIS — M722 Plantar fascial fibromatosis: Secondary | ICD-10-CM | POA: Diagnosis not present

## 2016-12-10 NOTE — Progress Notes (Signed)
Subjective:    Patient ID: Karen Gonzales, female   DOB: 71 y.o.   MRN: [id number]   HPI patient states I'm feeling much better with minimal pain    ROS      Objective:  Physical Exam Neurovascular status intact with significant diminishment of discomfort plantar aspect left heel    Assessment:   Improvement of pain plantar left heel      Plan:     Reviewed condition and recommended no further treatment and less needed. Patient wants to go this route and at this time will continue with home physical therapy and supportive shoe gear usage

## 2017-01-23 ENCOUNTER — Ambulatory Visit (INDEPENDENT_AMBULATORY_CARE_PROVIDER_SITE_OTHER): Payer: PPO | Admitting: Podiatry

## 2017-01-23 ENCOUNTER — Ambulatory Visit: Payer: PPO

## 2017-01-23 ENCOUNTER — Encounter: Payer: Self-pay | Admitting: Podiatry

## 2017-01-23 ENCOUNTER — Ambulatory Visit (INDEPENDENT_AMBULATORY_CARE_PROVIDER_SITE_OTHER): Payer: PPO

## 2017-01-23 DIAGNOSIS — M722 Plantar fascial fibromatosis: Secondary | ICD-10-CM

## 2017-01-23 DIAGNOSIS — M25572 Pain in left ankle and joints of left foot: Secondary | ICD-10-CM

## 2017-01-23 MED ORDER — TRIAMCINOLONE ACETONIDE 10 MG/ML IJ SUSP
10.0000 mg | Freq: Once | INTRAMUSCULAR | Status: AC
  Administered 2017-01-23: 10 mg

## 2017-01-23 NOTE — Progress Notes (Signed)
Subjective:    Patient ID: Karen Gonzales, female   DOB: 71 y.o.   MRN: [id number]   HPI patient states she started develop a lot of pain in her left heel over the last 3 weeks and the right arch bothers her slightly but she thinks it has she's walking differently    ROS      Objective:  Physical Exam neurovascular status intact muscle strength adequate with exquisite discomfort plantar aspect left heel at the insertional point tendon calcaneus with chronic nature to condition and pain in the right arch     Assessment:    Acute plantar fasciitis left with arch fasciitis right     Plan:    H&P conditions reviewed and careful injection administered left plantar fascia 3 mg Kenalog 5 mg Xylocaine and discussed fascial brace which she has to the right arch. I did scanned for custom orthotics to support the plantar arch and take stress off the feet  X-ray indicates no signs of stress fracture with small spur formation

## 2017-01-31 ENCOUNTER — Other Ambulatory Visit: Payer: Self-pay | Admitting: Family Medicine

## 2017-02-06 ENCOUNTER — Telehealth: Payer: Self-pay | Admitting: Podiatry

## 2017-02-06 NOTE — Telephone Encounter (Signed)
Left message for pt to call to schedule appt to see Liliane Channel to pick up orthotics

## 2017-02-21 DIAGNOSIS — S39012A Strain of muscle, fascia and tendon of lower back, initial encounter: Secondary | ICD-10-CM | POA: Diagnosis not present

## 2017-02-23 ENCOUNTER — Other Ambulatory Visit: Payer: Self-pay | Admitting: Family Medicine

## 2017-03-26 ENCOUNTER — Other Ambulatory Visit: Payer: Self-pay | Admitting: Family Medicine

## 2017-04-01 ENCOUNTER — Ambulatory Visit: Payer: PPO | Admitting: Orthotics

## 2017-04-01 DIAGNOSIS — M779 Enthesopathy, unspecified: Secondary | ICD-10-CM

## 2017-04-01 DIAGNOSIS — M7662 Achilles tendinitis, left leg: Secondary | ICD-10-CM

## 2017-04-01 DIAGNOSIS — M722 Plantar fascial fibromatosis: Secondary | ICD-10-CM

## 2017-04-01 DIAGNOSIS — L84 Corns and callosities: Secondary | ICD-10-CM

## 2017-04-01 NOTE — Progress Notes (Signed)
Patient came in today to pick up custom made foot orthotics.  The goals were accomplished and the patient reported no dissatisfaction with said orthotics.  Patient was advised of breakin period and how to report any issues. 

## 2017-04-08 ENCOUNTER — Encounter: Payer: Self-pay | Admitting: Family Medicine

## 2017-04-08 ENCOUNTER — Other Ambulatory Visit: Payer: Self-pay | Admitting: Family Medicine

## 2017-04-08 ENCOUNTER — Ambulatory Visit (INDEPENDENT_AMBULATORY_CARE_PROVIDER_SITE_OTHER): Payer: PPO | Admitting: Family Medicine

## 2017-04-08 VITALS — BP 126/82 | HR 90 | Temp 98.1°F | Resp 16 | Ht 65.0 in | Wt 199.4 lb

## 2017-04-08 DIAGNOSIS — E785 Hyperlipidemia, unspecified: Secondary | ICD-10-CM | POA: Diagnosis not present

## 2017-04-08 DIAGNOSIS — Z23 Encounter for immunization: Secondary | ICD-10-CM | POA: Diagnosis not present

## 2017-04-08 DIAGNOSIS — E119 Type 2 diabetes mellitus without complications: Secondary | ICD-10-CM | POA: Diagnosis not present

## 2017-04-08 DIAGNOSIS — I10 Essential (primary) hypertension: Secondary | ICD-10-CM | POA: Diagnosis not present

## 2017-04-08 LAB — BASIC METABOLIC PANEL
BUN: 14 mg/dL (ref 6–23)
CHLORIDE: 99 meq/L (ref 96–112)
CO2: 27 meq/L (ref 19–32)
Calcium: 9.6 mg/dL (ref 8.4–10.5)
Creatinine, Ser: 0.76 mg/dL (ref 0.40–1.20)
GFR: 80 mL/min (ref >60.00)
GLUCOSE: 139 mg/dL — AB (ref 70–99)
Potassium: 4.4 mEq/L (ref 3.5–5.1)
SODIUM: 133 meq/L — AB (ref 135–145)

## 2017-04-08 LAB — CBC WITH DIFFERENTIAL/PLATELET
BASOS ABS: 0.1 10*3/uL (ref 0.0–0.1)
Basophils Relative: 1.4 % (ref 0.0–3.0)
EOS PCT: 3 % (ref 0.0–5.0)
Eosinophils Absolute: 0.2 10*3/uL (ref 0.0–0.7)
HCT: 36 % (ref 36.0–46.0)
HEMOGLOBIN: 11.9 g/dL — AB (ref 12.0–15.0)
Lymphocytes Relative: 33.7 % (ref 12.0–46.0)
Lymphs Abs: 2.7 10*3/uL (ref 0.7–4.0)
MCHC: 33.1 g/dL (ref 30.0–36.0)
MCV: 83.6 fl (ref 78.0–100.0)
MONOS PCT: 8.9 % (ref 3.0–12.0)
Monocytes Absolute: 0.7 10*3/uL (ref 0.1–1.0)
Neutro Abs: 4.2 10*3/uL (ref 1.4–7.7)
Neutrophils Relative %: 53 % (ref 43.0–77.0)
Platelets: 245 10*3/uL (ref 150.0–400.0)
RBC: 4.31 Mil/uL (ref 3.87–5.11)
RDW: 14.6 % (ref 11.5–15.5)
WBC: 7.9 10*3/uL (ref 4.0–10.5)

## 2017-04-08 LAB — LIPID PANEL
CHOLESTEROL: 191 mg/dL (ref 0–200)
HDL: 38.5 mg/dL — ABNORMAL LOW (ref >39.00)
NonHDL: 152.83
Total CHOL/HDL Ratio: 5
Triglycerides: 218 mg/dL — ABNORMAL HIGH (ref 0.0–149.0)
VLDL: 43.6 mg/dL — ABNORMAL HIGH (ref 0.0–40.0)

## 2017-04-08 LAB — HEPATIC FUNCTION PANEL
ALT: 55 U/L — AB (ref 0–35)
AST: 42 U/L — ABNORMAL HIGH (ref 0–37)
Albumin: 4.3 g/dL (ref 3.5–5.2)
Alkaline Phosphatase: 46 U/L (ref 39–117)
BILIRUBIN DIRECT: 0.1 mg/dL (ref 0.0–0.3)
TOTAL PROTEIN: 7.6 g/dL (ref 6.0–8.3)
Total Bilirubin: 0.6 mg/dL (ref 0.2–1.2)

## 2017-04-08 LAB — HEMOGLOBIN A1C: Hgb A1c MFr Bld: 7.9 % — ABNORMAL HIGH (ref 4.6–6.5)

## 2017-04-08 LAB — TSH: TSH: 0.93 u[IU]/mL (ref 0.35–4.50)

## 2017-04-08 LAB — LDL CHOLESTEROL, DIRECT: Direct LDL: 104 mg/dL

## 2017-04-08 NOTE — Assessment & Plan Note (Signed)
Chronic problem.  Pt is not on medication and attempting to control w/ diet and exercise (despite gaining 5 lbs).  Check labs.  Start meds prn.

## 2017-04-08 NOTE — Assessment & Plan Note (Signed)
Chronic problem.  Adequate control today.  Asymptomatic w/ exception of SOB on exertion.  Encouraged pt to work on her cardiovascular fitness.  Check labs.  No anticipated med changes.  Will follow.

## 2017-04-08 NOTE — Progress Notes (Signed)
   Subjective:    Patient ID: Karen Gonzales, female    DOB: 27-Sep-1945, 71 y.o.   MRN: [id number]  HPI HTN- chronic problem, on Amlodipine 75m, Losartan 1033mdaily w/ good control.  No CP.  Some SOB w/ exertion- particularly in the heat.  No HAs, visual changes, edema.  Hyperlipidemia- chronic problem, not currently on a statin.  Attempting to control w/ healthy diet and regular exercise.  Last LDL 106.  No abd pain, N/V.  DM- chronic problem, on Glipizide, Metformin w/ hx of adequate control (last A1C 7.4).  UTD on eye exam, on ARB.  Due for foot exam.  Pt has gained 5 lbs.  Rare symptomatic lows.  No numbness/tingling of hands/feet.  No sores on feet.   Review of Systems For ROS see HPI     Objective:   Physical Exam  Constitutional: She is oriented to person, place, and time. She appears well-developed and well-nourished. No distress.  HENT:  Head: Normocephalic and atraumatic.  Eyes: Pupils are equal, round, and reactive to light. Conjunctivae and EOM are normal.  Neck: Normal range of motion. Neck supple. No thyromegaly present.  Cardiovascular: Normal rate, regular rhythm, normal heart sounds and intact distal pulses.   No murmur heard. Pulmonary/Chest: Effort normal and breath sounds normal. No respiratory distress.  Abdominal: Soft. She exhibits no distension. There is no tenderness.  Musculoskeletal: She exhibits no edema.  Lymphadenopathy:    She has no cervical adenopathy.  Neurological: She is alert and oriented to person, place, and time.  Skin: Skin is warm and dry.  Psychiatric: She has a normal mood and affect. Her behavior is normal.  Vitals reviewed.         Assessment & Plan:

## 2017-04-08 NOTE — Assessment & Plan Note (Signed)
Chronic problem.  Tolerating meds w/o difficulty.  She has gained 5 lbs.  Stressed need for healthy diet and regular exercise.  UTD on eye exam, on ARB for renal protection.  Foot exam done today.  Check labs.  Adjust meds prn

## 2017-04-08 NOTE — Progress Notes (Signed)
Pre visit review using our clinic review tool, if applicable. No additional management support is needed unless otherwise documented below in the visit note. 

## 2017-04-08 NOTE — Patient Instructions (Signed)
Schedule your complete physical in 6 months and your Annual Wellness Visit w/ Maudie Mercury around the same time [facility] notify you of your lab results and make any changes if needed Continue to work on healthy diet and regular exercise- you can do it! Call with any questions or concerns Happy Labor Day!!

## 2017-04-09 ENCOUNTER — Other Ambulatory Visit: Payer: Self-pay | Admitting: General Practice

## 2017-04-09 DIAGNOSIS — R7989 Other specified abnormal findings of blood chemistry: Secondary | ICD-10-CM

## 2017-04-09 DIAGNOSIS — R945 Abnormal results of liver function studies: Principal | ICD-10-CM

## 2017-04-09 MED ORDER — SIMVASTATIN 10 MG PO TABS: 10.0000 mg | ORAL_TABLET | Freq: Every day | ORAL | 6 refills | Status: DC

## 2017-04-28 ENCOUNTER — Other Ambulatory Visit: Payer: Self-pay | Admitting: Family Medicine

## 2017-05-20 ENCOUNTER — Other Ambulatory Visit (INDEPENDENT_AMBULATORY_CARE_PROVIDER_SITE_OTHER): Payer: PPO

## 2017-05-20 DIAGNOSIS — R7989 Other specified abnormal findings of blood chemistry: Secondary | ICD-10-CM

## 2017-05-20 DIAGNOSIS — R945 Abnormal results of liver function studies: Secondary | ICD-10-CM | POA: Diagnosis not present

## 2017-05-20 LAB — HEPATIC FUNCTION PANEL
ALT: 55 U/L — AB (ref 0–35)
AST: 39 U/L — AB (ref 0–37)
Albumin: 4.3 g/dL (ref 3.5–5.2)
Alkaline Phosphatase: 47 U/L (ref 39–117)
BILIRUBIN TOTAL: 0.5 mg/dL (ref 0.2–1.2)
Bilirubin, Direct: 0.1 mg/dL (ref 0.0–0.3)
Total Protein: 7.5 g/dL (ref 6.0–8.3)

## 2017-05-21 ENCOUNTER — Other Ambulatory Visit: Payer: Self-pay | Admitting: *Deleted

## 2017-05-21 DIAGNOSIS — E785 Hyperlipidemia, unspecified: Secondary | ICD-10-CM

## 2017-05-24 ENCOUNTER — Other Ambulatory Visit: Payer: Self-pay | Admitting: Family Medicine

## 2017-05-29 ENCOUNTER — Other Ambulatory Visit: Payer: Self-pay | Admitting: Family Medicine

## 2017-06-07 ENCOUNTER — Ambulatory Visit (INDEPENDENT_AMBULATORY_CARE_PROVIDER_SITE_OTHER): Payer: PPO | Admitting: Family Medicine

## 2017-06-07 ENCOUNTER — Other Ambulatory Visit: Payer: Self-pay

## 2017-06-07 VITALS — BP 160/90 | HR 72 | Temp 98.5°F | Ht 65.0 in | Wt 200.0 lb

## 2017-06-07 DIAGNOSIS — R059 Cough, unspecified: Secondary | ICD-10-CM

## 2017-06-07 DIAGNOSIS — R109 Unspecified abdominal pain: Secondary | ICD-10-CM | POA: Diagnosis not present

## 2017-06-07 DIAGNOSIS — R05 Cough: Secondary | ICD-10-CM | POA: Diagnosis not present

## 2017-06-07 DIAGNOSIS — R030 Elevated blood-pressure reading, without diagnosis of hypertension: Secondary | ICD-10-CM

## 2017-06-07 LAB — POCT URINALYSIS DIP (MANUAL ENTRY)
Bilirubin, UA: NEGATIVE
Blood, UA: NEGATIVE
GLUCOSE UA: NEGATIVE mg/dL
Ketones, POC UA: NEGATIVE mg/dL
LEUKOCYTES UA: NEGATIVE
NITRITE UA: NEGATIVE
PROTEIN UA: NEGATIVE mg/dL
Spec Grav, UA: 1.015 (ref 1.010–1.025)
UROBILINOGEN UA: 0.2 U/dL
pH, UA: 6 (ref 5.0–8.0)

## 2017-06-07 MED ORDER — AZITHROMYCIN 250 MG PO TABS: ORAL_TABLET | ORAL | 0 refills | Status: DC

## 2017-06-07 MED ORDER — GUAIFENESIN-CODEINE 100-10 MG/5ML PO SOLN: 10.0000 mL | Freq: Three times a day (TID) | ORAL | 0 refills | Status: DC | PRN

## 2017-06-07 NOTE — Patient Instructions (Signed)
Start the Z-Pak and cough syrup.  You can use Motrin and/or Tylenol as needed for your pain.  Please let us know if your symptoms are not improving in the next couple of days, or if they worsen.  Take care, Dr. Jerline Pain

## 2017-06-07 NOTE — Progress Notes (Signed)
    Subjective:  Karen Gonzales is a 71 y.o. female who presents today with a chief complaint of cough.   HPI:  Cough, acute issue Started about 2 weeks ago. No fevers or chills. Dry cough. Some nose bleed. Some sinus congestion. Took some cough syrup with codeine which helped some. No wheezing or shortness of breath. No sick contacts.  No other treatments tried.  No fevers.  Abdominal pain, acute issue Also started about 2 weeks ago.  Pain is located in her left and right upper abdomen radiating into her flanks.  She had some nausea yesterday without vomiting.  No hematuria.  Pain is worse with movement and coughing.  No treatments tried.  Normal bowel movements.  No dysuria.  She had her gallbladder removed several years ago.  ROS: Per HPI  PMH: Smoking history reviewed.  Never smoker.  Objective:  Physical Exam: BP (!) 160/90   Pulse 72   Temp 98.5 F (36.9 C) (Oral)   Ht 5' 5"  (1.651 m)   Wt 200 lb (90.7 kg)   SpO2 98%   BMI 33.28 kg/m   Gen: NAD, resting comfortably HEENT: TMs clear bilaterally.  Oropharynx mildly erythematous.  Maxillary and frontal sinuses transilluminate appropriately. CV: RRR with no murmurs appreciated Pulm: Frequent cough noted, otherwise NWOB, CTAB with no crackles, wheezes, or rhonchi GI: Normal bowel sounds present. Soft, Nontender, Nondistended. MSK: -Back: Mild right CVA tenderness.  No left CVA tenderness. Skin: Warm, dry Neuro: Grossly normal, moves all extremities Psych: Normal affect and thought content  Results for orders placed or performed in visit on 06/07/17 (from the past 72 hour(s))  POCT urinalysis dipstick     Status: None   Collection Time: 06/07/17  3:08 PM  Result Value Ref Range   Color, UA yellow yellow   Clarity, UA clear clear   Glucose, UA negative negative mg/dL   Bilirubin, UA negative negative   Ketones, POC UA negative negative mg/dL   Spec Grav, UA 1.015 1.010 - 1.025   Blood, UA negative negative   pH, UA  6.0 5.0 - 8.0   Protein Ur, POC negative negative mg/dL   Urobilinogen, UA 0.2 0.2 or 1.0 E.U./dL   Nitrite, UA Negative Negative   Leukocytes, UA Negative Negative    Assessment/Plan:  Cough Given 2-week course we will treat with a Z-Pak.  Will also give guaifenesin with codeine for cough.  Encouraged good oral hydration.  Return precautions reviewed.  Follow-up as needed.  Abdominal Pain Likely muscular strain related to her frequent cough.  No red flag signs or symptoms.  Abdominal exam benign.  UA without hematuria.  Recommended over-the-counter analgesics as needed for pain. Discussed reasons to return to care.  Follow-up as needed.  Elevated Blood Pressure Reading Typically well controlled. Likely elevated in setting of acute illness and cough. Advised home monitoring. Patient has follow up with PCP in 10 days.  Algis Greenhouse. Jerline Pain, MD 06/07/2017 3:16 PM

## 2017-06-17 ENCOUNTER — Encounter: Payer: Self-pay | Admitting: Family Medicine

## 2017-06-17 ENCOUNTER — Encounter: Payer: Self-pay | Admitting: General Practice

## 2017-06-17 ENCOUNTER — Other Ambulatory Visit: Payer: Self-pay | Admitting: General Practice

## 2017-06-17 ENCOUNTER — Ambulatory Visit: Payer: PPO | Admitting: Family Medicine

## 2017-06-17 ENCOUNTER — Ambulatory Visit (INDEPENDENT_AMBULATORY_CARE_PROVIDER_SITE_OTHER): Payer: PPO

## 2017-06-17 ENCOUNTER — Other Ambulatory Visit: Payer: PPO

## 2017-06-17 ENCOUNTER — Telehealth: Payer: Self-pay | Admitting: *Deleted

## 2017-06-17 VITALS — BP 123/73 | HR 90 | Temp 98.3°F | Resp 18 | Ht 65.0 in | Wt 199.2 lb

## 2017-06-17 DIAGNOSIS — R059 Cough, unspecified: Secondary | ICD-10-CM

## 2017-06-17 DIAGNOSIS — E785 Hyperlipidemia, unspecified: Secondary | ICD-10-CM | POA: Diagnosis not present

## 2017-06-17 DIAGNOSIS — R05 Cough: Secondary | ICD-10-CM

## 2017-06-17 DIAGNOSIS — R0602 Shortness of breath: Secondary | ICD-10-CM | POA: Diagnosis not present

## 2017-06-17 LAB — HEPATIC FUNCTION PANEL
ALT: 53 U/L — ABNORMAL HIGH (ref 0–35)
AST: 39 U/L — ABNORMAL HIGH (ref 0–37)
Albumin: 4.4 g/dL (ref 3.5–5.2)
Alkaline Phosphatase: 47 U/L (ref 39–117)
BILIRUBIN DIRECT: 0.1 mg/dL (ref 0.0–0.3)
BILIRUBIN TOTAL: 0.5 mg/dL (ref 0.2–1.2)
TOTAL PROTEIN: 7.4 g/dL (ref 6.0–8.3)

## 2017-06-17 LAB — LIPID PANEL
CHOL/HDL RATIO: 3
Cholesterol: 147 mg/dL (ref 0–200)
HDL: 48.4 mg/dL (ref >39.00)
LDL Cholesterol: 67 mg/dL (ref 0–99)
NonHDL: 98.32
Triglycerides: 155 mg/dL — ABNORMAL HIGH (ref 0.0–149.0)
VLDL: 31 mg/dL (ref 0.0–40.0)

## 2017-06-17 MED ORDER — BECLOMETHASONE DIPROPIONATE 80 MCG/ACT IN AERS: 1.0000 | INHALATION_SPRAY | Freq: Two times a day (BID) | RESPIRATORY_TRACT | 1 refills | Status: DC

## 2017-06-17 MED ORDER — PREDNISONE 10 MG PO TABS: ORAL_TABLET | ORAL | 0 refills | Status: DC

## 2017-06-17 MED ORDER — PROMETHAZINE-PHENYLEPHRINE 6.25-5 MG/5ML PO SYRP: 5.0000 mL | ORAL_SOLUTION | ORAL | 0 refills | Status: DC | PRN

## 2017-06-17 MED ORDER — GUAIFENESIN-CODEINE 100-10 MG/5ML PO SOLN: 10.0000 mL | Freq: Three times a day (TID) | ORAL | 0 refills | Status: DC | PRN

## 2017-06-17 MED ORDER — ALBUTEROL SULFATE (2.5 MG/3ML) 0.083% IN NEBU
2.5000 mg | INHALATION_SOLUTION | Freq: Once | RESPIRATORY_TRACT | Status: AC
  Administered 2017-06-17: 2.5 mg via RESPIRATORY_TRACT

## 2017-06-17 MED ORDER — BECLOMETHASONE DIPROPIONATE 80 MCG/ACT IN AERS: 1.0000 | INHALATION_SPRAY | Freq: Two times a day (BID) | RESPIRATORY_TRACT | 6 refills | Status: DC

## 2017-06-17 NOTE — Telephone Encounter (Signed)
Will fax codeine cough syrup for her

## 2017-06-17 NOTE — Patient Instructions (Signed)
Go to [place] office and get your chest xray We'll notify you of your lab results and make any changes if needed Start the Prednisone today as directed- take w/ food.  3 tabs at the same time x3 days and then 2 tabs at the same time x3 days, and then 1 tab daily.  (This will cause your sugar to go up so please be careful with your diet) Use the Albuterol inhaler as needed for cough Use the Qvar inhaler twice daily until feeling better Use the cough syrup as needed ADD Mucinex DM to help w/ cough Drink plenty of fluids REST! Call with any questions or concerns Hang in there!

## 2017-06-17 NOTE — Progress Notes (Signed)
   Subjective:    Patient ID: Karen Gonzales, female    DOB: July 30, 1946, 71 y.o.   MRN: [id number]  HPI Cough- pt saw Dr Jerline Pain on 10/26 and was prescribed a Zpack and cough syrup w/ codeine.  No fevers, sore throat.  Cough is intermittently productive.  + HA after coughing and back pain from muscle soreness.  Also took husband's left over Amoxicillin w/o relief.  + nasal congestion but no sinus pain/pressure.  No known sick contacts.   Review of Systems For ROS see HPI     Objective:   Physical Exam  Constitutional: She is oriented to person, place, and time. She appears well-developed and well-nourished. No distress.  HENT:  Head: Normocephalic and atraumatic.  TMs normal bilaterally Mild nasal congestion Throat w/out erythema, edema, or exudate  Eyes: Conjunctivae and EOM are normal. Pupils are equal, round, and reactive to light.  Neck: Normal range of motion. Neck supple.  Cardiovascular: Normal rate, regular rhythm, normal heart sounds and intact distal pulses.  No murmur heard. Pulmonary/Chest: Effort normal and breath sounds normal. No respiratory distress. She has no wheezes.  + near continuous hacking cough, improved mildly w/ neb tx  Lymphadenopathy:    She has no cervical adenopathy.  Neurological: She is alert and oriented to person, place, and time.  Skin: Skin is warm and dry.  Psychiatric: She has a normal mood and affect. Her behavior is normal. Thought content normal.  Vitals reviewed.         Assessment & Plan:  Cough- pt has completed Zpack and took remaining Amoxicillin w/o improvement.  Suspect this is viral w/ bronchospasm but will get CXR to assess.  Cough mildly improved w/ neb tx.  Pt reports she has albuterol at home.  Start Qvar until pt feeling better.  Prednisone as directed- cautioned about elevated sugars.  Cough meds prn.  Reviewed supportive care and red flags that should prompt return.  Pt expressed understanding and is in agreement w/ plan.

## 2017-06-17 NOTE — Telephone Encounter (Signed)
Patient notified of PCP recommendations and is agreement and expresses an understanding.  

## 2017-06-17 NOTE — Telephone Encounter (Signed)
Patient states that the cough medicine that was sent in is too expensive and the pharmacist suggested that she call and see if there is something else we can send in for her.

## 2017-06-19 ENCOUNTER — Telehealth: Payer: Self-pay | Admitting: *Deleted

## 2017-06-19 NOTE — Telephone Encounter (Signed)
PA started through Cover my meds for QVAR Redihaler 4mg.     KEY: [id number]

## 2017-06-20 ENCOUNTER — Other Ambulatory Visit: Payer: Self-pay | Admitting: General Practice

## 2017-06-20 MED ORDER — BECLOMETHASONE DIPROP HFA 80 MCG/ACT IN AERB: 1.0000 | INHALATION_SPRAY | Freq: Two times a day (BID) | RESPIRATORY_TRACT | 3 refills | Status: DC

## 2017-06-21 NOTE — Telephone Encounter (Signed)
Medication approved 06/20/17-08/12/2018.   Pharmacy is aware for filling of medication.

## 2017-06-30 DIAGNOSIS — S76311A Strain of muscle, fascia and tendon of the posterior muscle group at thigh level, right thigh, initial encounter: Secondary | ICD-10-CM | POA: Diagnosis not present

## 2017-06-30 DIAGNOSIS — M7630 Iliotibial band syndrome, unspecified leg: Secondary | ICD-10-CM | POA: Diagnosis not present

## 2017-07-02 DIAGNOSIS — K3184 Gastroparesis: Secondary | ICD-10-CM | POA: Diagnosis not present

## 2017-07-02 DIAGNOSIS — Z7984 Long term (current) use of oral hypoglycemic drugs: Secondary | ICD-10-CM | POA: Diagnosis not present

## 2017-07-02 DIAGNOSIS — R1310 Dysphagia, unspecified: Secondary | ICD-10-CM | POA: Diagnosis not present

## 2017-07-02 DIAGNOSIS — K219 Gastro-esophageal reflux disease without esophagitis: Secondary | ICD-10-CM | POA: Diagnosis not present

## 2017-07-02 DIAGNOSIS — E119 Type 2 diabetes mellitus without complications: Secondary | ICD-10-CM | POA: Diagnosis not present

## 2017-07-03 ENCOUNTER — Other Ambulatory Visit: Payer: Self-pay | Admitting: Gastroenterology

## 2017-07-03 ENCOUNTER — Other Ambulatory Visit: Payer: Self-pay | Admitting: General Practice

## 2017-07-03 DIAGNOSIS — R1310 Dysphagia, unspecified: Secondary | ICD-10-CM

## 2017-07-03 MED ORDER — GABAPENTIN 300 MG PO CAPS: 300.0000 mg | ORAL_CAPSULE | Freq: Three times a day (TID) | ORAL | 1 refills | Status: DC

## 2017-07-05 DIAGNOSIS — M5431 Sciatica, right side: Secondary | ICD-10-CM | POA: Diagnosis not present

## 2017-07-11 ENCOUNTER — Ambulatory Visit
Admission: RE | Admit: 2017-07-11 | Discharge: 2017-07-11 | Disposition: A | Discharge status: Home / Self Care | Payer: PPO | Source: Ambulatory Visit | Attending: Gastroenterology | Admitting: Gastroenterology

## 2017-07-11 DIAGNOSIS — K219 Gastro-esophageal reflux disease without esophagitis: Secondary | ICD-10-CM | POA: Diagnosis not present

## 2017-07-11 DIAGNOSIS — R1310 Dysphagia, unspecified: Secondary | ICD-10-CM

## 2017-07-17 ENCOUNTER — Other Ambulatory Visit: Payer: Self-pay

## 2017-07-17 ENCOUNTER — Encounter: Payer: Self-pay | Admitting: Family Medicine

## 2017-07-17 ENCOUNTER — Ambulatory Visit: Payer: PPO | Admitting: Family Medicine

## 2017-07-17 VITALS — BP 142/83 | HR 88 | Temp 98.2°F | Resp 16 | Ht 65.0 in | Wt 197.5 lb

## 2017-07-17 DIAGNOSIS — M5431 Sciatica, right side: Secondary | ICD-10-CM | POA: Diagnosis not present

## 2017-07-17 MED ORDER — PREDNISONE 10 MG PO TABS: ORAL_TABLET | ORAL | 0 refills | Status: DC

## 2017-07-17 NOTE — Patient Instructions (Signed)
Follow up in 1 month to recheck diabetes Start the Prednisone as directed- 3 tabs at the same time x3 days, then 2 tabs at the same time x3 days, and then 1 tab daily x3 days.  Take w/ food. STOP the Meloxicam (Mobic) Continue to heat Do some gentle stretching to avoid stiffness Call with any questions or concerns Happy Holidays!!!

## 2017-07-17 NOTE — Progress Notes (Signed)
   Subjective:    Patient ID: Karen Gonzales, female    DOB: November 15, 1945, 71 y.o.   MRN: [id number]  HPI R sided sciatica- went to [facility] x2 for the pain in the last month.  Earlier was having to use a walker due to the severe pain.  Previously had similar pain on the L but this improved.  Painful to sit, stand, walk.  Radiates to just above the knee posteriorly.  Denies numbness.  Meloxicam has helped her walk w/o walker.   Review of Systems For ROS see HPI     Objective:   Physical Exam  Constitutional: She is oriented to person, place, and time. She appears well-developed and well-nourished. No distress.  HENT:  Head: Normocephalic and atraumatic.  Musculoskeletal: She exhibits no deformity.  No pain w/ forward flexion of back, + pain w/ extension of back + TTP over R sciatic notch  Neurological: She is alert and oriented to person, place, and time.  + SLR on R, (-) on L  Skin: Skin is warm and dry.  Psychiatric: She has a normal mood and affect. Her behavior is normal. Thought content normal.  Vitals reviewed.         Assessment & Plan:  R sided sciatica- pt has had partial relief w/ NSAIDs but continues to have pain.  Start pred taper- cautioned pt about impact on sugars.  STOP Meloxicam.  Reviewed supportive care and red flags that should prompt return.  Pt expressed understanding and is in agreement w/ plan.

## 2017-07-24 ENCOUNTER — Other Ambulatory Visit: Payer: Self-pay | Admitting: General Practice

## 2017-07-24 MED ORDER — GLIPIZIDE ER 5 MG PO TB24: 5.0000 mg | ORAL_TABLET | Freq: Every day | ORAL | 1 refills | Status: DC

## 2017-07-28 ENCOUNTER — Other Ambulatory Visit: Payer: Self-pay | Admitting: Family Medicine

## 2017-07-29 DIAGNOSIS — K3184 Gastroparesis: Secondary | ICD-10-CM | POA: Diagnosis not present

## 2017-07-29 DIAGNOSIS — K760 Fatty (change of) liver, not elsewhere classified: Secondary | ICD-10-CM | POA: Diagnosis not present

## 2017-07-29 DIAGNOSIS — R1310 Dysphagia, unspecified: Secondary | ICD-10-CM | POA: Diagnosis not present

## 2017-07-29 DIAGNOSIS — E118 Type 2 diabetes mellitus with unspecified complications: Secondary | ICD-10-CM | POA: Diagnosis not present

## 2017-07-29 DIAGNOSIS — K219 Gastro-esophageal reflux disease without esophagitis: Secondary | ICD-10-CM | POA: Diagnosis not present

## 2017-07-29 DIAGNOSIS — Z7984 Long term (current) use of oral hypoglycemic drugs: Secondary | ICD-10-CM | POA: Diagnosis not present

## 2017-08-01 ENCOUNTER — Ambulatory Visit: Payer: PPO | Admitting: Family Medicine

## 2017-08-02 ENCOUNTER — Ambulatory Visit: Payer: PPO | Admitting: Family Medicine

## 2017-08-02 ENCOUNTER — Encounter: Payer: Self-pay | Admitting: Family Medicine

## 2017-08-02 VITALS — BP 150/70 | HR 95 | Temp 98.1°F | Ht 65.0 in | Wt 196.2 lb

## 2017-08-02 DIAGNOSIS — M5431 Sciatica, right side: Secondary | ICD-10-CM | POA: Diagnosis not present

## 2017-08-02 DIAGNOSIS — E1140 Type 2 diabetes mellitus with diabetic neuropathy, unspecified: Secondary | ICD-10-CM

## 2017-08-02 MED ORDER — TRAMADOL HCL 50 MG PO TABS: 50.0000 mg | ORAL_TABLET | Freq: Three times a day (TID) | ORAL | 0 refills | Status: DC | PRN

## 2017-08-02 MED ORDER — GABAPENTIN 300 MG PO CAPS: 600.0000 mg | ORAL_CAPSULE | Freq: Three times a day (TID) | ORAL | 1 refills | Status: DC

## 2017-08-02 NOTE — Progress Notes (Signed)
Subjective  CC:  Chief Complaint  Patient presents with  . Right sciatic nerve pain    x 1 month, radiates down right leg    HPI: Karen Gonzales is a 71 y.o. female who presents to the office today to address the problems listed above in the chief complaint. I've personally reviewed recent office visit notes, hospital notes, associated labs and imaging reports and/or pertinent outside office records via chart review or CareEverywhere. Briefly, 71 year old female with type 2 diabetes, fibromyalgia, hypertension with a 4-5-week course of right sciatic pain.  Evaluated at urgent care x2 and then again here in the office about 2-3 weeks ago.  She has been treated with meloxicam, prednisone and has had mild improvement over the last 2-3 weeks.  Initially, pain was severe and made it difficult for her to ambulate.  She did need a walker to help her walk.  That is improved but she still complains of 6-7 out of 10 pain, constant.  Worse with walking.  Better if lying down.  No bowel or bladder changes, no lower extremity weakness.  She has history of lumbar DJD.  No trauma.  Reports hyperglycemia to 200s due to prednisone.  She is currently off of the prednisone.  She does have diabetic peripheral neuropathy and is on gabapentin 300 3 times daily.  She denies new symptoms  I reviewed the patients updated PMH, FH, and SocHx.    Patient Active Problem List   Diagnosis Date Noted  . Physical exam 10/10/2016  . Gastroparesis due to DM ([place]) 06/05/2016  . Right shoulder pain 02/03/2016  . Diabetes mellitus type II, non insulin dependent ([place]) 11/07/2015  . Hyperlipidemia 11/07/2015  . Skull mass 07/02/2011  . Fibromyalgia 06/25/2011  . Essential hypertension 09/24/2007   Current Meds  Medication Sig  . amLODipine (NORVASC) 5 MG tablet TAKE 1 TABLET (5 MG TOTAL) BY MOUTH EVERY MORNING.  . Blood Glucose Monitoring Suppl (ONE TOUCH ULTRA 2) w/Device KIT Pt uses glucometer to check sugars twice daily.  Dx E11.9  . gabapentin (NEURONTIN) 300 MG capsule Take 2 capsules (600 mg total) by mouth 3 (three) times daily.  Marland Kitchen glipiZIDE (GLUCOTROL XL) 5 MG 24 hr tablet Take 1 tablet (5 mg total) by mouth daily with breakfast.  . losartan (COZAAR) 100 MG tablet TAKE 1 TABLET (100 MG TOTAL) BY MOUTH DAILY.  . metFORMIN (GLUCOPHAGE) 500 MG tablet TAKE 2 TABLETS (1,000 MG TOTAL) BY MOUTH 2 (TWO) TIMES DAILY WITH A MEAL.  Marland Kitchen omeprazole (PRILOSEC) 40 MG capsule Take 1 capsule (40 mg total) by mouth 2 (two) times daily.  . ONE TOUCH ULTRA TEST test strip CHECK BLOOD SUGAR 1-2 TIMES DAILY  . ONETOUCH DELICA LANCETS 69G MISC USE WHEN CHECKING BLOOD SUGAR AS DIRECTED  . Pyridoxine HCl (VITAMIN B6 PO) Take by mouth.  . simvastatin (ZOCOR) 10 MG tablet Take 1 tablet (10 mg total) by mouth at bedtime.  . [DISCONTINUED] gabapentin (NEURONTIN) 300 MG capsule Take 1 capsule (300 mg total) by mouth 3 (three) times daily.    Allergies: Patient is allergic to nsaids; reglan [metoclopramide]; and estrogens. Family History: Patient family history includes Breast cancer in her maternal grandmother and paternal aunt; Cancer in her father, maternal grandmother, and paternal grandmother; Healthy in her brother; Heart disease in her father; Kidney disease in her mother; Osteoporosis in her father and mother. Social History:  Patient  reports that  has never smoked. she has never used smokeless tobacco. She reports that  she does not drink alcohol or use drugs.  Review of Systems: Constitutional: Negative for fever malaise or anorexia Cardiovascular: negative for chest pain Respiratory: negative for SOB or persistent cough Gastrointestinal: negative for abdominal pain  Objective  Vitals: BP (!) 150/70 (BP Location: Left Arm, Patient Position: Sitting, Cuff Size: Large)   Pulse 95   Temp 98.1 F (36.7 C) (Oral)   Ht 5' 5"  (1.651 m)   Wt 196 lb 4 oz (89 kg)   SpO2 96%   BMI 32.66 kg/m  General: no acute distress ,  A&Ox3, appears uncomfortable shifting in her seat often. HEENT:  Oropharynx moist,neck is supple Cardiovascular:  RRR  Respiratory:  Good breath sounds bilaterally, CTAB with normal respiratory effort Skin:  Warm, no rashes Back: Negative straight leg raise bilaterally, full range of motion at the back with mild pain with extension, mild tenderness at right sciatic notch, no SI joint tenderness, no paravertebral muscle tenderness Neuro: Mild antalgic gait, +1 distal pulses, downgoing Romberg  Assessment  1. Right sided sciatica      Plan   Persistent sciatica, mildly improved: Recommend tramadol for pain, increase Neurontin to 600 3 times daily over the next 3-5 days and continue until pain is improved.  Start physical therapy.  Recheck in 4-6 weeks if unresolved.  Discussed red flags of bowel or bladder dysfunction, lower extremity weakness or severe pain.  Hyperglycemia due to steroids in a diabetic.  Continue increasing water intake and monitoring sugars.  She has follow-up with her PCP soon to recheck her A1c.  Follow up: As scheduled   Commons side effects, risks, benefits, and alternatives for medications and treatment plan prescribed today were discussed, and the patient expressed understanding of the given instructions. Patient is instructed to call or message via MyChart if he/she has any questions or concerns regarding our treatment plan. No barriers to understanding were identified. We discussed Red Flag symptoms and signs in detail. Patient expressed understanding regarding what to do in case of urgent or emergency type symptoms.   Medication list was reconciled, printed and provided to the patient in AVS. Patient instructions and summary information was reviewed with the patient as documented in the AVS. This note was prepared with assistance of Dragon voice recognition software. Occasional wrong-word or sound-a-like substitutions may have occurred due to the inherent limitations  of voice recognition software  Orders Placed This Encounter  Procedures  . Ambulatory referral to Physical Therapy   Meds ordered this encounter  Medications  . traMADol (ULTRAM) 50 MG tablet    Sig: Take 1 tablet (50 mg total) by mouth every 8 (eight) hours as needed.    Dispense:  30 tablet    Refill:  0  . gabapentin (NEURONTIN) 300 MG capsule    Sig: Take 2 capsules (600 mg total) by mouth 3 (three) times daily.    Dispense:  270 capsule    Refill:  1

## 2017-08-02 NOTE — Patient Instructions (Signed)
Please keep your follow up appointment with Dr. Birdie Riddle.  We will call you with information regarding your referral appointment. Physical Therapy  Increase your gabapentin to 625m three times a day until your back pain is better.    Sciatica Sciatica is pain, numbness, weakness, or tingling along the path of the sciatic nerve. The sciatic nerve starts in the lower back and runs down the back of each leg. The nerve controls the muscles in the lower leg and in the back of the knee. It also provides feeling (sensation) to the back of the thigh, the lower leg, and the sole of the foot. Sciatica is a symptom of another medical condition that pinches or puts pressure on the sciatic nerve. Generally, sciatica only affects one side of the body. Sciatica usually goes away on its own or with treatment. In some cases, sciatica may keep coming back (recur). What are the causes? This condition is caused by pressure on the sciatic nerve, or pinching of the sciatic nerve. This may be the result of:  A disk in between the bones of the spine (vertebrae) bulging out too far (herniated disk).  Age-related changes in the spinal disks (degenerative disk disease).  A pain disorder that affects a muscle in the buttock (piriformis syndrome).  Extra bone growth (bone spur) near the sciatic nerve.  An injury or break (fracture) of the pelvis.  Pregnancy.  Tumor (rare).  What increases the risk? The following factors may make you more likely to develop this condition:  Playing sports that place pressure or stress on the spine, such as football or weight lifting.  Having poor strength and flexibility.  A history of back injury.  A history of back surgery.  Sitting for long periods of time.  Doing activities that involve repetitive bending or lifting.  Obesity.  What are the signs or symptoms? Symptoms can vary from mild to very severe, and they may include:  Any of these problems in the lower  back, leg, hip, or buttock: ? Mild tingling or dull aches. ? Burning sensations. ? Sharp pains.  Numbness in the back of the calf or the sole of the foot.  Leg weakness.  Severe back pain that makes movement difficult.  These symptoms may get worse when you cough, sneeze, or laugh, or when you sit or stand for long periods of time. Being overweight may also make symptoms worse. In some cases, symptoms may recur over time. How is this diagnosed? This condition may be diagnosed based on:  Your symptoms.  A physical exam. Your health care provider may ask you to do certain movements to check whether those movements trigger your symptoms.  You may have tests, including: ? Blood tests. ? X-rays. ? MRI. ? CT scan.  How is this treated? In many cases, this condition improves on its own, without any treatment. However, treatment may include:  Reducing or modifying physical activity during periods of pain.  Exercising and stretching to strengthen your abdomen and improve the flexibility of your spine.  Icing and applying heat to the affected area.  Medicines that help: ? To relieve pain and swelling. ? To relax your muscles.  Injections of medicines that help to relieve pain, irritation, and inflammation around the sciatic nerve (steroids).  Surgery.  Follow these instructions at home: Medicines  Take over-the-counter and prescription medicines only as told by your health care provider.  Do not drive or operate heavy machinery while taking prescription pain medicine. Managing pain  If directed, apply ice to the affected area. ? Put ice in a plastic bag. ? Place a towel between your skin and the bag. ? Leave the ice on for 20 minutes, 2-3 times a day.  After icing, apply heat to the affected area before you exercise or as often as told by your health care provider. Use the heat source that your health care provider recommends, such as a moist heat pack or a heating  pad. ? Place a towel between your skin and the heat source. ? Leave the heat on for 20-30 minutes. ? Remove the heat if your skin turns bright red. This is especially important if you are unable to feel pain, heat, or cold. You may have a greater risk of getting burned. Activity  Return to your normal activities as told by your health care provider. Ask your health care provider what activities are safe for you. ? Avoid activities that make your symptoms worse.  Take brief periods of rest throughout the day. Resting in a lying or standing position is usually better than sitting to rest. ? When you rest for longer periods, mix in some mild activity or stretching between periods of rest. This will help to prevent stiffness and pain. ? Avoid sitting for long periods of time without moving. Get up and move around at least one time each hour.  Exercise and stretch regularly, as told by your health care provider.  Do not lift anything that is heavier than 10 lb (4.5 kg) while you have symptoms of sciatica. When you do not have symptoms, you should still avoid heavy lifting, especially repetitive heavy lifting.  When you lift objects, always use proper lifting technique, which includes: ? Bending your knees. ? Keeping the load close to your body. ? Avoiding twisting. General instructions  Use good posture. ? Avoid leaning forward while sitting. ? Avoid hunching over while standing.  Maintain a healthy weight. Excess weight puts extra stress on your back and makes it difficult to maintain good posture.  Wear supportive, comfortable shoes. Avoid wearing high heels.  Avoid sleeping on a mattress that is too soft or too hard. A mattress that is firm enough to support your back when you sleep may help to reduce your pain.  Keep all follow-up visits as told by your health care provider. This is important. Contact a health care provider if:  You have pain that wakes you up when you are  sleeping.  You have pain that gets worse when you lie down.  Your pain is worse than you have experienced in the past.  Your pain lasts longer than 4 weeks.  You experience unexplained weight loss. Get help right away if:  You lose control of your bowel or bladder (incontinence).  You have: ? Weakness in your lower back, pelvis, buttocks, or legs that gets worse. ? Redness or swelling of your back. ? A burning sensation when you urinate. This information is not intended to replace advice given to you by your health care provider. Make sure you discuss any questions you have with your health care provider. Document Released: 07/24/2001 Document Revised: 01/03/2016 Document Reviewed: 04/08/2015 Elsevier Interactive Patient Education  [employer].

## 2017-08-12 ENCOUNTER — Encounter: Payer: Self-pay | Admitting: Physical Therapy

## 2017-08-12 ENCOUNTER — Ambulatory Visit: Payer: PPO | Attending: Family Medicine | Admitting: Physical Therapy

## 2017-08-12 DIAGNOSIS — M6281 Muscle weakness (generalized): Secondary | ICD-10-CM | POA: Insufficient documentation

## 2017-08-12 DIAGNOSIS — M5441 Lumbago with sciatica, right side: Secondary | ICD-10-CM | POA: Diagnosis not present

## 2017-08-12 DIAGNOSIS — M25551 Pain in right hip: Secondary | ICD-10-CM | POA: Insufficient documentation

## 2017-08-12 DIAGNOSIS — M62838 Other muscle spasm: Secondary | ICD-10-CM | POA: Diagnosis not present

## 2017-08-12 NOTE — Therapy (Addendum)
[facility] [address] [place], [place], [address] Phone: [phone]   Fax:  [phone]  Physical Therapy Evaluation  Patient Details  Name: Karen Gonzales MRN: [id number] Date of Birth: 05-07-1946 Referring Provider: Billey Chang, MD    Encounter Date: 08/12/2017  PT End of Session - 08/12/17 1008    Visit Number  1    Number of Visits  16    Date for PT Re-Evaluation  10/07/17    Authorization Type  [employer]    Authorization Time Period  08/12/17 to 10/07/17    PT Start Time  0934    PT Stop Time  1012    PT Time Calculation (min)  38 min    Activity Tolerance  Patient tolerated treatment well;No increased pain    Behavior During Therapy  WFL for tasks assessed/performed       Past Medical History:  Diagnosis Date  . Anti-cardiolipin antibody syndrome ([place]) 06/25/2011  . Arthritis   . Blood transfusion    had when knee was replaced  . Bronchitis    history of  . Chills   . Complication of anesthesia   . Cough   . Diabetes mellitus   . DVT (deep venous thrombosis) ([place]) 06/25/2011  . Fatty liver disease, nonalcoholic    determinde by Ultrasound 11/11  . GERD (gastroesophageal reflux disease)   . H/O vitamin D deficiency   . Heart murmur   . Heart murmur    Benign  . Hiatal hernia   . Hyperlipidemia   . Hypertension   . Hyponatremia    with diuretic use  . Osteopenia   . PONV (postoperative nausea and vomiting)   . Vertigo     Past Surgical History:  Procedure Laterality Date  . ABDOMINAL HYSTERECTOMY    . ACHILLES TENDON SURGERY     bilateral   . CARPAL TUNNEL RELEASE     bilateral   . CHOLECYSTECTOMY  1969   open  . CRANIOTOMY  07/09/2011   Procedure: CRANIOTOMY HEMATOMA EVACUATION SUBDURAL;  Surgeon: Cooper Render Pool;  Location: [facility];  Service: Neurosurgery;  Laterality: Right;  Excision of Skull Lesion-Right Right Parietal Crani w/ Resection of Skull Tumor (1  1/2 hours - to follow)  . DILATION AND CURETTAGE OF UTERUS    . JOINT REPLACEMENT  2004   left knee replacement   . KNEE SURGERY     total of 6 surgeries on both knees   . TUBAL LIGATION    . TUMOR REMOVAL     on pelvic bone     There were no vitals filed for this visit.   Subjective Assessment - 08/12/17 0937    Subjective  Pt reports that right before Thanksgiving, she got up one morning and could hardly walk without pain down her Rt leg/buttock area. She started using a walker to get around and her pain seemed to be getting worse. She has seen a couple of difference physicians for this, and was diagnosed with sciatica. She has noticed improvements in her pain with taking the medication prescribed. Although she does have diabetic neuropathy, she has worsening numbness on the Rt foot/calf region compared to the Lt.     Limitations  Walking    How long can you sit comfortably?  unlimited     Patient Stated Goals  improve pain with walking     Currently in Pain?  Yes    Pain Score  5  Pain Location  Buttocks    Pain Orientation  Right    Pain Descriptors / Indicators  Aching    Pain Type  Acute pain    Pain Radiating Towards  down posterior lateral thigh     Pain Onset  More than a month ago    Pain Frequency  Constant    Aggravating Factors   worse with walking in the morning    Pain Relieving Factors  heat and sitting on a cushion    Effect of Pain on Daily Activities  moderate limitation         [facility] PT Assessment - 08/12/17 0001      Assessment   Medical Diagnosis  Rt sided sciatica     Referring Provider  Billey Chang, MD     Onset Date/Surgical Date  -- before Thanksgiving     Next MD Visit  none for this     Prior Therapy  none       Precautions   Precautions  None      Balance Screen   Has the patient fallen in the past 6 months  No    Has the patient had a decrease in activity level because of a fear of falling?   No    Is the patient reluctant to leave  their home because of a fear of falling?   No      Prior Function   Level of Independence  Independent      Observation/Other Assessments   Observations  pt sitting shifted to the Lt       ROM / Strength   AROM / PROM / Strength  AROM;Strength      AROM   AROM Assessment Site  Lumbar    Lumbar Flexion  50% limited, pain free    Lumbar Extension  limited 50%, pain end range     Lumbar - Right Side Bend  pain free    Lumbar - Left Side Bend  pain free      Strength   Strength Assessment Site  Hip;Knee;Ankle    Right/Left Hip  Right;Left    Right Hip Flexion  4+/5    Right Hip Extension  3+/5    Right Hip ABduction  3+/5    Left Hip Flexion  4+/5    Left Hip Extension  3+/5    Left Hip ABduction  4/5    Right/Left Knee  Right;Left    Right Knee Flexion  3+/5    Right Knee Extension  5/5    Left Knee Flexion  3+/5    Left Knee Extension  5/5      Flexibility   Soft Tissue Assessment /Muscle Length  yes    Hamstrings  Lt: 45 deg lacking, Rt: 50 deg lacking     Quadriceps  90 deg in prone     ITB  (+) ober's on Rt     Piriformis  50% limited BLE      Palpation   Palpation comment  Rt greater trochanter tender with palpation, Rt glute med/ITB/lateralt thigh tender and muscle spasm noted       Special Tests    Special Tests  Lumbar    Lumbar Tests  FABER test;Slump Test;Straight Leg Raise;Prone Knee Bend Test      Slump test   Findings  Positive    Side  Right      Prone Knee Bend Test   Findings  Negative  Straight Leg Raise   Findings  Positive    Side   Right      Transfers   Five time sit to stand comments   15 sec, weight shift Lt      Ambulation/Gait   Gait Comments  decreased stance time on the Rt, decreased hip extension on the Rt             Objective measurements completed on examination: See above findings.              PT Education - 08/12/17 1010    Education provided  Yes    Education Details  eval findings/POC;  expectations of therapy in addressing symptoms/etc.    Person(s) Educated  Patient    Methods  Explanation    Comprehension  Verbalized understanding       PT Short Term Goals - 08/12/17 1119      PT SHORT TERM GOAL #1   Title  Pt will demo consistency and independence with her HEP in order to decrease pain and improve LE strength.    Time  2    Period  Weeks    Status  New    Target Date  08/26/17      PT SHORT TERM GOAL #2   Title  Pt will demo improved hamstring and quadriceps flexibility by atleast 10deg which will improve her alignment in sitting and standing positions.     Time  4    Period  Weeks    Status  New    Target Date  09/09/17      PT SHORT TERM GOAL #3   Title  Pt will report atleast 30% decrease in her pain with walking, to improve her ability to walk her dog as needed.     Time  4    Period  Weeks    Status  New        PT Long Term Goals - 08/12/17 1126      PT LONG TERM GOAL #1   Title  Pt will demo improved BLE strength to atleast 4/5 MMT which will improve her safety and efficiency with daily activity.     Time  8    Period  Weeks    Status  New    Target Date  10/07/17      PT LONG TERM GOAL #2   Title  Pt will complete 5x sit to stand in less than 13 sec without UE support or significant weight shift, to reflect improvements in her functional strength/power.    Time  8    Period  Weeks    Status  New      PT LONG TERM GOAL #3   Title  Pt will report decrease in symptoms and ability to walk atleast 20 min without need for breaks or increase in pain to allow her to resume regular fitness routine.     Time  8    Period  Weeks    Status  New      PT LONG TERM GOAL #4   Title  Pt will report atleast 60% improvement in her pain and symptoms from the start of therapy, which will allow her to complete activitiy throughout the day without need for assistance.     Time  8    Period  Weeks    Status  New             Plan - 08/12/17 1012  Clinical Impression Statement  Pt is a pleasant 71 y.o F with recent onset of Rt sided buttock/leg pain with weight bearing activity. She does have positive slump test on the Rt, and there is significant tightness/restriction along the Rt gluteal/piriformis/ITB region which reproduced her familiar symptoms. In addition to this, she has Pt demonstrates preference for the LLE during sit to stand and ambulation as well. Although the low back has not been ruled out, her symptoms are familiar with piriformis syndrome and trochanteric bursitis which the pt reports she has had in the past. She would benefit from skilled PT to address her limitations in flexibility, strength and decrease pain and improve her activity tolerance at home.    History and Personal Factors relevant to plan of care:  history of Rt hip bursitis     Clinical Presentation  Evolving    Clinical Presentation due to:  improving with medication    Rehab Potential  Good    PT Frequency  2x / week    PT Duration  8 weeks    PT Treatment/Interventions  ADLs/Self Care Home Management;Electrical Stimulation;Cryotherapy;Moist Heat;Traction;Balance training;Therapeutic exercise;Therapeutic activities;Functional mobility training;Stair training;Gait training;Neuromuscular re-education;Patient/family education;Manual techniques;Passive range of motion;Dry needling;Taping    PT Next Visit Plan  manual techniques to address soft tissue restrictions along the hip; gentle hip strengthening; hip flexibility    PT Home Exercise Plan  next visit     Consulted and Agree with Plan of Care  Patient       Patient will benefit from skilled therapeutic intervention in order to improve the following deficits and impairments:  Abnormal gait, Decreased activity tolerance, Decreased balance, Difficulty walking, Impaired flexibility, Decreased strength, Decreased range of motion, Decreased endurance, Decreased mobility, Postural dysfunction, Increased muscle  spasms, Pain, Improper body mechanics  Visit Diagnosis: Pain in right hip  Acute right-sided low back pain with right-sided sciatica  Muscle weakness (generalized)  Other muscle spasm     Problem List Patient Active Problem List   Diagnosis Date Noted  . Physical exam 10/10/2016  . Gastroparesis due to DM ([place]) 06/05/2016  . Right shoulder pain 02/03/2016  . Diabetes mellitus type II, non insulin dependent ([place]) 11/07/2015  . Hyperlipidemia 11/07/2015  . Skull mass 07/02/2011  . Fibromyalgia 06/25/2011  . Essential hypertension 09/24/2007    11:49 AM,08/12/17 Elly Modena PT, DPT [facility] [address] [place], [place], [address] Phone: [phone]   Fax:  [phone]  Name: Karen Gonzales MRN: [id number] Date of Birth: Jan 22, 1946   *addendum made to include evaluation G-codes  Functional limitation: mobility walking and moving around Current: 40-60% limited Goal: 20-40% limited  3:30 PM,08/12/17 Elly Modena PT, [place] [facility] [place]

## 2017-08-16 ENCOUNTER — Encounter: Payer: Self-pay | Admitting: Physical Therapy

## 2017-08-16 ENCOUNTER — Ambulatory Visit: Payer: PPO | Attending: Family Medicine | Admitting: Physical Therapy

## 2017-08-16 DIAGNOSIS — M25551 Pain in right hip: Secondary | ICD-10-CM | POA: Diagnosis not present

## 2017-08-16 DIAGNOSIS — M62838 Other muscle spasm: Secondary | ICD-10-CM | POA: Insufficient documentation

## 2017-08-16 DIAGNOSIS — M5441 Lumbago with sciatica, right side: Secondary | ICD-10-CM | POA: Diagnosis not present

## 2017-08-16 DIAGNOSIS — M6281 Muscle weakness (generalized): Secondary | ICD-10-CM | POA: Insufficient documentation

## 2017-08-16 NOTE — Therapy (Signed)
[facility] [address], [place], [place], [address] Phone: [phone]   Fax:  [phone]  Physical Therapy Treatment  Patient Details  Name: Karen Gonzales MRN: [id number] Date of Birth: 1945/09/24 Referring Provider: Billey Chang, MD    Encounter Date: 08/16/2017  PT End of Session - 08/16/17 0937    Visit Number  2    Number of Visits  16    Date for PT Re-Evaluation  10/07/17    Authorization Type  [employer]    Authorization Time Period  08/12/17 to 10/07/17    PT Start Time  0930    PT Stop Time  1010    PT Time Calculation (min)  40 min    Activity Tolerance  Patient tolerated treatment well;No increased pain    Behavior During Therapy  WFL for tasks assessed/performed       Past Medical History:  Diagnosis Date  . Anti-cardiolipin antibody syndrome ([place]) 06/25/2011  . Arthritis   . Blood transfusion    had when knee was replaced  . Bronchitis    history of  . Chills   . Complication of anesthesia   . Cough   . Diabetes mellitus   . DVT (deep venous thrombosis) ([place]) 06/25/2011  . Fatty liver disease, nonalcoholic    determinde by Ultrasound 11/11  . GERD (gastroesophageal reflux disease)   . H/O vitamin D deficiency   . Heart murmur   . Heart murmur    Benign  . Hiatal hernia   . Hyperlipidemia   . Hypertension   . Hyponatremia    with diuretic use  . Osteopenia   . PONV (postoperative nausea and vomiting)   . Vertigo     Past Surgical History:  Procedure Laterality Date  . ABDOMINAL HYSTERECTOMY    . ACHILLES TENDON SURGERY     bilateral   . CARPAL TUNNEL RELEASE     bilateral   . CHOLECYSTECTOMY  1969   open  . CRANIOTOMY  07/09/2011   Procedure: CRANIOTOMY HEMATOMA EVACUATION SUBDURAL;  Surgeon: Cooper Render Pool;  Location: [facility];  Service: Neurosurgery;  Laterality: Right;  Excision of Skull Lesion-Right Right Parietal Crani w/ Resection of Skull Tumor (1 1/2  hours - to follow)  . DILATION AND CURETTAGE OF UTERUS    . JOINT REPLACEMENT  2004   left knee replacement   . KNEE SURGERY     total of 6 surgeries on both knees   . TUBAL LIGATION    . TUMOR REMOVAL     on pelvic bone     There were no vitals filed for this visit.  Subjective Assessment - 08/16/17 0935    Subjective  Tuesday night I did not take the medicine and had a bad pain.     Limitations  Walking    How long can you sit comfortably?  unlimited     Patient Stated Goals  improve pain with walking     Currently in Pain?  Yes    Pain Score  5     Pain Location  Buttocks    Pain Orientation  Right    Pain Descriptors / Indicators  Aching    Pain Type  Acute pain    Pain Radiating Towards  down posterior lateral thigh    Pain Onset  More than a month ago    Pain Frequency  Constant    Aggravating Factors   worse with walking in  the morning    Pain Relieving Factors  heat and sitting on a cushion    Effect of Pain on Daily Activities  moderate limitation    Multiple Pain Sites  No                      [facility] Adult PT Treatment/Exercise - 08/16/17 0001      Exercises   Exercises  Lumbar      Lumbar Exercises: Aerobic   Stationary Bike  nustep 5 min, level 1, seat #7, arm #10             PT Education - 08/16/17 1007    Education provided  Yes    Education Details  stretches for back    Person(s) Educated  Patient    Methods  Explanation;Demonstration;Verbal cues;Handout    Comprehension  Returned demonstration;Verbalized understanding       PT Short Term Goals - 08/12/17 1119      PT SHORT TERM GOAL #1   Title  Pt will demo consistency and independence with her HEP in order to decrease pain and improve LE strength.    Time  2    Period  Weeks    Status  New    Target Date  08/26/17      PT SHORT TERM GOAL #2   Title  Pt will demo improved hamstring and quadriceps flexibility by atleast 10deg which will improve her alignment in sitting  and standing positions.     Time  4    Period  Weeks    Status  New    Target Date  09/09/17      PT SHORT TERM GOAL #3   Title  Pt will report atleast 30% decrease in her pain with walking, to improve her ability to walk her dog as needed.     Time  4    Period  Weeks    Status  New        PT Long Term Goals - 08/12/17 1126      PT LONG TERM GOAL #1   Title  Pt will demo improved BLE strength to atleast 4/5 MMT which will improve her safety and efficiency with daily activity.     Time  8    Period  Weeks    Status  New    Target Date  10/07/17      PT LONG TERM GOAL #2   Title  Pt will complete 5x sit to stand in less than 13 sec without UE support or significant weight shift, to reflect improvements in her functional strength/power.    Time  8    Period  Weeks    Status  New      PT LONG TERM GOAL #3   Title  Pt will report decrease in symptoms and ability to walk atleast 20 min without need for breaks or increase in pain to allow her to resume regular fitness routine.     Time  8    Period  Weeks    Status  New      PT LONG TERM GOAL #4   Title  Pt will report atleast 60% improvement in her pain and symptoms from the start of therapy, which will allow her to complete activitiy throughout the day without need for assistance.     Time  8    Period  Weeks    Status  New  Plan - 08/16/17 0937    Clinical Impression Statement  Patient has increased mobility of right buttocks, lumbar paraspinals and hamstring after soft tissue work. Patient has difficulty with engaging her lower abdominals and requires many verbal and tactile cues. Patient has not met goals due to just starting therapy.  Patient will benefit from skilled therapy to address her limitations in flexibility, strength and decrease pain and improve her activity tolerance at home.     Rehab Potential  Good    Clinical Impairments Affecting Rehab Potential  history of right hip bursitis    PT  Frequency  2x / week    PT Duration  8 weeks    PT Treatment/Interventions  ADLs/Self Care Home Management;Electrical Stimulation;Cryotherapy;Moist Heat;Traction;Balance training;Therapeutic exercise;Therapeutic activities;Functional mobility training;Stair training;Gait training;Neuromuscular re-education;Patient/family education;Manual techniques;Passive range of motion;Dry needling;Taping    PT Next Visit Plan  manual techniques to address soft tissue restrictions along the hip; gentle hip strengthening; Body mechanics    PT Home Exercise Plan  progress as needed    Recommended Other Services  MD signed the initial eval    Consulted and Agree with Plan of Care  Patient       Patient will benefit from skilled therapeutic intervention in order to improve the following deficits and impairments:  Abnormal gait, Decreased activity tolerance, Decreased balance, Difficulty walking, Impaired flexibility, Decreased strength, Decreased range of motion, Decreased endurance, Decreased mobility, Postural dysfunction, Increased muscle spasms, Pain, Improper body mechanics  Visit Diagnosis: Pain in right hip  Acute right-sided low back pain with right-sided sciatica  Muscle weakness (generalized)  Other muscle spasm     Problem List Patient Active Problem List   Diagnosis Date Noted  . Physical exam 10/10/2016  . Gastroparesis due to DM ([place]) 06/05/2016  . Right shoulder pain 02/03/2016  . Diabetes mellitus type II, non insulin dependent ([place]) 11/07/2015  . Hyperlipidemia 11/07/2015  . Skull mass 07/02/2011  . Fibromyalgia 06/25/2011  . Essential hypertension 09/24/2007    Earlie Counts, PT 08/16/17 10:11 AM   [facility] [address] [place], [place], [address] Phone: [phone]   Fax:  [phone]  Name: Karen Gonzales MRN: [id number] Date of Birth: Apr 03, 1946

## 2017-08-16 NOTE — Patient Instructions (Addendum)
Chair Sitting    Sit at edge of seat, spine straight, one leg extended. Put a hand on each thigh and bend forward from the hip, keeping spine straight. Allow hand on extended leg to reach toward toes. Support upper body with other arm. Hold _30__ seconds. Repeat __2_ times per session. Do __1_ sessions per day.  Copyright  VHI. All rights reserved.  Piriformis Stretch, Sitting    Sit, one ankle on opposite knee, same-side hand on crossed knee. Push down on knee, keeping spine straight. Lean torso forward, with flat back, until tension is felt in hamstrings and gluteals of crossed-leg side. Hold _30__ seconds.  Repeat __2_ times per session. Do _1__ sessions per day.  Copyright  VHI. All rights reserved.  Knee-to-Chest Stretch: Unilateral    With hand behind right knee, pull knee in to chest until a comfortable stretch is felt in lower back and buttocks. Keep back relaxed. Hold _30___ seconds. Repeat ___1_ times per set. Do ___1_ sets per session. Do __1__ sessions per day.  http://orth.exer.us/126   Copyright  VHI. All rights reserved.  [facility] [address], [place], [place] [address] Phone # [phone] Fax [phone]

## 2017-08-19 DIAGNOSIS — K219 Gastro-esophageal reflux disease without esophagitis: Secondary | ICD-10-CM | POA: Diagnosis not present

## 2017-08-19 DIAGNOSIS — K317 Polyp of stomach and duodenum: Secondary | ICD-10-CM | POA: Diagnosis not present

## 2017-08-21 ENCOUNTER — Ambulatory Visit: Payer: PPO | Admitting: Physical Therapy

## 2017-08-21 DIAGNOSIS — M6281 Muscle weakness (generalized): Secondary | ICD-10-CM

## 2017-08-21 DIAGNOSIS — M62838 Other muscle spasm: Secondary | ICD-10-CM

## 2017-08-21 DIAGNOSIS — M25551 Pain in right hip: Secondary | ICD-10-CM

## 2017-08-21 DIAGNOSIS — M5441 Lumbago with sciatica, right side: Secondary | ICD-10-CM

## 2017-08-21 NOTE — Patient Instructions (Signed)
  BRIDGING  While lying on your back with knees bent, tighten your lower abdominals, squeeze your buttocks and then raise your buttocks off the floor/bed as creating a "Bridge" with your body. Hold and then lower yourself and repeat. x10 reps, 2x/day      SUPINE HIP ABDUCTION - ELASTIC BAND CLAMS - [place]  Lie down on your back with your knees bent. Place an elastic band around your knees and then draw your knees apart.  2x10 reps   [facility] [address] [place], [place] [address] Phone # [phone] Fax [phone]

## 2017-08-21 NOTE — Therapy (Signed)
[facility] [address] [place], [place], [address] Phone: [phone]   Fax:  [phone]  Physical Therapy Treatment  Patient Details  Name: Karen Gonzales MRN: [id number] Date of Birth: [birth year] Referring Provider: Billey Chang, MD    Encounter Date: 08/21/2017  PT End of Session - 08/21/17 0950    Visit Number  3    Number of Visits  16    Date for PT Re-Evaluation  10/07/17    Authorization Type  [employer]    Authorization Time Period  08/12/17 to 10/07/17    PT Start Time  0937    PT Stop Time  1015    PT Time Calculation (min)  38 min    Activity Tolerance  Patient tolerated treatment well;No increased pain    Behavior During Therapy  WFL for tasks assessed/performed       Past Medical History:  Diagnosis Date  . Anti-cardiolipin antibody syndrome ([place]) 06/25/2011  . Arthritis   . Blood transfusion    had when knee was replaced  . Bronchitis    history of  . Chills   . Complication of anesthesia   . Cough   . Diabetes mellitus   . DVT (deep venous thrombosis) ([place]) 06/25/2011  . Fatty liver disease, nonalcoholic    determinde by Ultrasound 11/11  . GERD (gastroesophageal reflux disease)   . H/O vitamin D deficiency   . Heart murmur   . Heart murmur    Benign  . Hiatal hernia   . Hyperlipidemia   . Hypertension   . Hyponatremia    with diuretic use  . Osteopenia   . PONV (postoperative nausea and vomiting)   . Vertigo     Past Surgical History:  Procedure Laterality Date  . ABDOMINAL HYSTERECTOMY    . ACHILLES TENDON SURGERY     bilateral   . CARPAL TUNNEL RELEASE     bilateral   . CHOLECYSTECTOMY  1969   open  . CRANIOTOMY  07/09/2011   Procedure: CRANIOTOMY HEMATOMA EVACUATION SUBDURAL;  Surgeon: Cooper Render Pool;  Location: [facility];  Service: Neurosurgery;  Laterality: Right;  Excision of Skull Lesion-Right Right Parietal Crani w/ Resection of Skull Tumor (1 1/2  hours - to follow)  . DILATION AND CURETTAGE OF UTERUS    . JOINT REPLACEMENT  2004   left knee replacement   . KNEE SURGERY     total of 6 surgeries on both knees   . TUBAL LIGATION    . TUMOR REMOVAL     on pelvic bone     There were no vitals filed for this visit.  Subjective Assessment - 08/21/17 0939    Subjective  Pt reports that she felt good after her last session, but did note increase in pain along her Rt ankle recently. She has been completing her exercises that she was given last session.     Limitations  Walking    How long can you sit comfortably?  unlimited     Patient Stated Goals  improve pain with walking     Currently in Pain?  Yes    Pain Score  3     Pain Location  Buttocks    Pain Orientation  Right    Pain Descriptors / Indicators  Aching    Pain Type  Acute pain    Pain Radiating Towards  down posterior lateral thigh    Pain Onset  More than a  month ago    Pain Frequency  Constant    Aggravating Factors   worst in the morning    Pain Relieving Factors  heating and sitting on a cushion     Effect of Pain on Daily Activities  moderate                       [facility] Adult PT Treatment/Exercise - 08/21/17 0001      Lumbar Exercises: Stretches   Active Hamstring Stretch  10 seconds;Limitations    Active Hamstring Stretch Limitations  x10 reps each LE    Piriformis Stretch  1 rep;30 seconds;Limitations    Piriformis Stretch Limitations  Lt/Rt seated      Lumbar Exercises: Supine   Ab Set  15 reps;2 seconds;Limitations    AB Set Limitations  heavy cuing required, pt unable to properly activate without cues    Clam  10 reps;Limitations    Clam Limitations  red TB x10 reps     Bridge  10 reps    Other Supine Lumbar Exercises  nerve flossing, RLE x15 reps       Manual Therapy   Manual Therapy  Soft tissue mobilization    Soft tissue mobilization  Rt gluteals/lateral quadriceps             PT Education - 08/21/17 0942    Education  provided  Yes    Education Details  glute strengthening    Person(s) Educated  Patient    Methods  Explanation;Handout    Comprehension  Verbalized understanding;Returned demonstration       PT Short Term Goals - 08/12/17 1119      PT SHORT TERM GOAL #1   Title  Pt will demo consistency and independence with her HEP in order to decrease pain and improve LE strength.    Time  2    Period  Weeks    Status  New    Target Date  08/26/17      PT SHORT TERM GOAL #2   Title  Pt will demo improved hamstring and quadriceps flexibility by atleast 10deg which will improve her alignment in sitting and standing positions.     Time  4    Period  Weeks    Status  New    Target Date  09/09/17      PT SHORT TERM GOAL #3   Title  Pt will report atleast 30% decrease in her pain with walking, to improve her ability to walk her dog as needed.     Time  4    Period  Weeks    Status  New        PT Long Term Goals - 08/12/17 1126      PT LONG TERM GOAL #1   Title  Pt will demo improved BLE strength to atleast 4/5 MMT which will improve her safety and efficiency with daily activity.     Time  8    Period  Weeks    Status  New    Target Date  10/07/17      PT LONG TERM GOAL #2   Title  Pt will complete 5x sit to stand in less than 13 sec without UE support or significant weight shift, to reflect improvements in her functional strength/power.    Time  8    Period  Weeks    Status  New      PT LONG TERM GOAL #3  Title  Pt will report decrease in symptoms and ability to walk atleast 20 min without need for breaks or increase in pain to allow her to resume regular fitness routine.     Time  8    Period  Weeks    Status  New      PT LONG TERM GOAL #4   Title  Pt will report atleast 60% improvement in her pain and symptoms from the start of therapy, which will allow her to complete activitiy throughout the day without need for assistance.     Time  8    Period  Weeks    Status  New             Plan - 08/21/17 0950    Clinical Impression Statement  Pt responded well to last session with overall decrease in pain upon arrival. Overall, she feels she might be 20-30% improved although she continues to have pain in the mornings when she wakes up. Session continued with therex to promote hip strength and manual techniques along the Rt hip region. Pt reports no increase in her pain following today's session and demonstrated good understanding of everything added to her HEP.     Rehab Potential  Good    Clinical Impairments Affecting Rehab Potential  history of right hip bursitis    PT Frequency  2x / week    PT Duration  8 weeks    PT Treatment/Interventions  ADLs/Self Care Home Management;Electrical Stimulation;Cryotherapy;Moist Heat;Traction;Balance training;Therapeutic exercise;Therapeutic activities;Functional mobility training;Stair training;Gait training;Neuromuscular re-education;Patient/family education;Manual techniques;Passive range of motion;Dry needling;Taping    PT Next Visit Plan  manual techniques to address soft tissue restrictions along the hip; gentle hip strengthening; Body mechanics    PT Home Exercise Plan  progress as needed    Consulted and Agree with Plan of Care  Patient       Patient will benefit from skilled therapeutic intervention in order to improve the following deficits and impairments:  Abnormal gait, Decreased activity tolerance, Decreased balance, Difficulty walking, Impaired flexibility, Decreased strength, Decreased range of motion, Decreased endurance, Decreased mobility, Postural dysfunction, Increased muscle spasms, Pain, Improper body mechanics  Visit Diagnosis: Pain in right hip  Acute right-sided low back pain with right-sided sciatica  Muscle weakness (generalized)  Other muscle spasm     Problem List Patient Active Problem List   Diagnosis Date Noted  . Physical exam 10/10/2016  . Gastroparesis due to DM ([place]) 06/05/2016   . Right shoulder pain 02/03/2016  . Diabetes mellitus type II, non insulin dependent ([place]) 11/07/2015  . Hyperlipidemia 11/07/2015  . Skull mass 07/02/2011  . Fibromyalgia 06/25/2011  . Essential hypertension 09/24/2007   10:18 AM,08/21/17 Elly Modena PT, DPT [facility] [address] [place], [place], [address] Phone: [phone]   Fax:  [phone]  Name: Karen Gonzales MRN: [id number] Date of Birth: 17-Nov-1945

## 2017-08-22 DIAGNOSIS — K317 Polyp of stomach and duodenum: Secondary | ICD-10-CM | POA: Diagnosis not present

## 2017-08-23 ENCOUNTER — Encounter: Payer: Self-pay | Admitting: Physical Therapy

## 2017-08-23 ENCOUNTER — Ambulatory Visit: Payer: PPO | Admitting: Physical Therapy

## 2017-08-23 DIAGNOSIS — M25551 Pain in right hip: Secondary | ICD-10-CM

## 2017-08-23 DIAGNOSIS — M5441 Lumbago with sciatica, right side: Secondary | ICD-10-CM

## 2017-08-23 DIAGNOSIS — M6281 Muscle weakness (generalized): Secondary | ICD-10-CM

## 2017-08-23 DIAGNOSIS — M62838 Other muscle spasm: Secondary | ICD-10-CM

## 2017-08-23 NOTE — Patient Instructions (Addendum)
Getting Into / Out of Bed    Lower self to lie down on one side by raising legs and lowering head at the same time. Use arms to assist moving without twisting. Bend both knees to roll onto back if desired. To sit up, start from lying on side, and use same move-ments in reverse. Keep trunk aligned with legs.   Copyright  VHI. All rights reserved.  Log Roll    Lying on back, bend left knee and place left arm across chest. Roll all in one movement to the right. Reverse to roll to the left. Always move as one unit.   Copyright  VHI. All rights reserved.  Getting Into / Out of Car    Lower self onto seat, scoot back, then bring in one leg at a time. Reverse sequence to get out.   Copyright  VHI. All rights reserved.  Avoid Twisting    Avoid twisting or bending back. Pivot around using foot movements, and bend at knees if needed when reaching for articles.   Copyright  VHI. All rights reserved.  Housework - Vacuuming    Hold the vacuum with arm held at side. Step back and forth to move it, keeping head up. Avoid twisting.   Copyright  VHI. All rights reserved.  Brushing Teeth     Place one foot on ledge and one hand on counter. Bend other knee slightly to keep back straight.  Copyright  VHI. All rights reserved.  Housework - Tub Scrub    Kneel down and use long-handled sponge or brush to reach.   Copyright  VHI. All rights reserved.  Laundry [employer] down and hold basket close to stand. Use leg muscles to do the work.   Copyright  VHI. All rights reserved.  [facility] [address] [place], [place] [address] Phone # [phone] Fax [phone]

## 2017-08-23 NOTE — Therapy (Signed)
[facility] [address] [place], [place], [address] Phone: [phone]   Fax:  [phone]  Physical Therapy Treatment  Patient Details  Name: Karen Gonzales MRN: [id number] Date of Birth: 1946/01/01 Referring Provider: Billey Chang, MD    Encounter Date: 08/23/2017  PT End of Session - 08/23/17 1013    Visit Number  4    Number of Visits  16    Authorization Type  [employer]    Authorization Time Period  08/12/17 to 10/07/17    PT Start Time  0930    PT Stop Time  1013    PT Time Calculation (min)  43 min    Activity Tolerance  Patient tolerated treatment well;No increased pain    Behavior During Therapy  WFL for tasks assessed/performed       Past Medical History:  Diagnosis Date  . Anti-cardiolipin antibody syndrome ([place]) 06/25/2011  . Arthritis   . Blood transfusion    had when knee was replaced  . Bronchitis    history of  . Chills   . Complication of anesthesia   . Cough   . Diabetes mellitus   . DVT (deep venous thrombosis) ([place]) 06/25/2011  . Fatty liver disease, nonalcoholic    determinde by Ultrasound 11/11  . GERD (gastroesophageal reflux disease)   . H/O vitamin D deficiency   . Heart murmur   . Heart murmur    Benign  . Hiatal hernia   . Hyperlipidemia   . Hypertension   . Hyponatremia    with diuretic use  . Osteopenia   . PONV (postoperative nausea and vomiting)   . Vertigo     Past Surgical History:  Procedure Laterality Date  . ABDOMINAL HYSTERECTOMY    . ACHILLES TENDON SURGERY     bilateral   . CARPAL TUNNEL RELEASE     bilateral   . CHOLECYSTECTOMY  1969   open  . CRANIOTOMY  07/09/2011   Procedure: CRANIOTOMY HEMATOMA EVACUATION SUBDURAL;  Surgeon: Cooper Render Pool;  Location: [facility];  Service: Neurosurgery;  Laterality: Right;  Excision of Skull Lesion-Right Right Parietal Crani w/ Resection of Skull Tumor (1 1/2 hours - to follow)  . DILATION AND  CURETTAGE OF UTERUS    . JOINT REPLACEMENT  2004   left knee replacement   . KNEE SURGERY     total of 6 surgeries on both knees   . TUBAL LIGATION    . TUMOR REMOVAL     on pelvic bone     There were no vitals filed for this visit.  Subjective Assessment - 08/23/17 0934    Subjective  I over did it yesterday.  I mopped and changed sheets and a load of clothes.  Pain is 30% better.     Limitations  Walking    How long can you sit comfortably?  unlimited     Patient Stated Goals  improve pain with walking     Currently in Pain?  Yes    Pain Score  4     Pain Location  Buttocks    Pain Orientation  Right    Pain Descriptors / Indicators  Aching    Pain Type  Acute pain    Pain Radiating Towards  down posterior right lateral thigh    Pain Onset  More than a month ago    Pain Frequency  Intermittent    Aggravating Factors   worst in the morning  Pain Relieving Factors  heating and sititng on a cushion         [facility] PT Assessment - 08/23/17 0001      Flexibility   Hamstrings  right lackin 10 degrees; left lacking 8 degrees                  [facility] Adult PT Treatment/Exercise - 08/23/17 0001      Lumbar Exercises: Stretches   Active Hamstring Stretch  10 seconds;Limitations    Active Hamstring Stretch Limitations  x10 reps each LE    Piriformis Stretch  1 rep;30 seconds;Limitations    Piriformis Stretch Limitations  Lt/Rt seated      Lumbar Exercises: Seated   Sit to Stand  10 reps      Lumbar Exercises: Supine   Ab Set  15 reps;2 seconds;Limitations    AB Set Limitations  minimal cuing required, pt unable to properly activate without cues    Clam  10 reps;Limitations    Clam Limitations  red TB x10 reps     Bridge  15 reps needed tactile cues to lift hips leveled    Other Supine Lumbar Exercises  nerve flossing, RLE x15 reps       Manual Therapy   Manual Therapy  Soft tissue mobilization    Soft tissue mobilization  Rt gluteals/lateral quadriceps              PT Education - 08/23/17 1001    Education provided  Yes    Education Details  body mechanics with daily tasks    Person(s) Educated  Patient    Methods  Explanation;Demonstration;Handout    Comprehension  Verbalized understanding;Returned demonstration       PT Short Term Goals - 08/23/17 1009      PT SHORT TERM GOAL #1   Title  Pt will demo consistency and independence with her HEP in order to decrease pain and improve LE strength.    Time  2    Period  Weeks    Status  Achieved      PT SHORT TERM GOAL #2   Title  Pt will demo improved hamstring and quadriceps flexibility by atleast 10deg which will improve her alignment in sitting and standing positions.     Time  4    Period  Weeks    Status  Achieved      PT SHORT TERM GOAL #3   Title  Pt will report atleast 30% decrease in her pain with walking, to improve her ability to walk her dog as needed.     Time  4    Period  Weeks    Status  Achieved        PT Long Term Goals - 08/12/17 1126      PT LONG TERM GOAL #1   Title  Pt will demo improved BLE strength to atleast 4/5 MMT which will improve her safety and efficiency with daily activity.     Time  8    Period  Weeks    Status  New    Target Date  10/07/17      PT LONG TERM GOAL #2   Title  Pt will complete 5x sit to stand in less than 13 sec without UE support or significant weight shift, to reflect improvements in her functional strength/power.    Time  8    Period  Weeks    Status  New      PT LONG TERM  GOAL #3   Title  Pt will report decrease in symptoms and ability to walk atleast 20 min without need for breaks or increase in pain to allow her to resume regular fitness routine.     Time  8    Period  Weeks    Status  New      PT LONG TERM GOAL #4   Title  Pt will report atleast 60% improvement in her pain and symptoms from the start of therapy, which will allow her to complete activitiy throughout the day without need for assistance.      Time  8    Period  Weeks    Status  New            Plan - 08/23/17 1013    Clinical Impression Statement  Patient reports 30% better.  Patient needed minimal cueing with abdominal contraction. Patient has increased hamstring length bilaterally.  Patient has met all of the STG's.  Patient will benefit from skilled therapy to reduce pain and improve muscle strength.     Rehab Potential  Good    Clinical Impairments Affecting Rehab Potential  history of right hip bursitis    PT Frequency  2x / week    PT Duration  8 weeks    PT Treatment/Interventions  ADLs/Self Care Home Management;Electrical Stimulation;Cryotherapy;Moist Heat;Traction;Balance training;Therapeutic exercise;Therapeutic activities;Functional mobility training;Stair training;Gait training;Neuromuscular re-education;Patient/family education;Manual techniques;Passive range of motion;Dry needling;Taping    PT Next Visit Plan  manual techniques to address soft tissue restrictions along the hip; gentle hip strengthening    PT Home Exercise Plan  progress as needed    Consulted and Agree with Plan of Care  Patient       Patient will benefit from skilled therapeutic intervention in order to improve the following deficits and impairments:  Abnormal gait, Decreased activity tolerance, Decreased balance, Difficulty walking, Impaired flexibility, Decreased strength, Decreased range of motion, Decreased endurance, Decreased mobility, Postural dysfunction, Increased muscle spasms, Pain, Improper body mechanics  Visit Diagnosis: Pain in right hip  Acute right-sided low back pain with right-sided sciatica  Muscle weakness (generalized)  Other muscle spasm     Problem List Patient Active Problem List   Diagnosis Date Noted  . Physical exam 10/10/2016  . Gastroparesis due to DM ([place]) 06/05/2016  . Right shoulder pain 02/03/2016  . Diabetes mellitus type II, non insulin dependent ([place]) 11/07/2015  . Hyperlipidemia 11/07/2015   . Skull mass 07/02/2011  . Fibromyalgia 06/25/2011  . Essential hypertension 09/24/2007    Earlie Counts, PT 08/23/17 10:16 AM   [facility] [address] [place], [place], [address] Phone: [phone]   Fax:  [phone]  Name: Karen Gonzales MRN: [id number] Date of Birth: Oct 23, 1945

## 2017-08-26 ENCOUNTER — Other Ambulatory Visit: Payer: Self-pay

## 2017-08-26 ENCOUNTER — Ambulatory Visit (INDEPENDENT_AMBULATORY_CARE_PROVIDER_SITE_OTHER): Payer: PPO | Admitting: Family Medicine

## 2017-08-26 ENCOUNTER — Encounter: Payer: Self-pay | Admitting: Family Medicine

## 2017-08-26 VITALS — BP 138/86 | HR 90 | Temp 98.3°F | Resp 17 | Ht 65.0 in | Wt 195.4 lb

## 2017-08-26 DIAGNOSIS — R0981 Nasal congestion: Secondary | ICD-10-CM

## 2017-08-26 MED ORDER — FLUTICASONE PROPIONATE 50 MCG/ACT NA SUSP: 2.0000 | Freq: Every day | NASAL | 6 refills | Status: DC

## 2017-08-26 MED ORDER — CETIRIZINE HCL 10 MG PO TABS: 10.0000 mg | ORAL_TABLET | Freq: Every day | ORAL | 11 refills | Status: DC

## 2017-08-26 NOTE — Progress Notes (Signed)
   Subjective:    Patient ID: Karen Gonzales, female    DOB: [birth year], 72 y.o.   MRN: [id number]  HPI Congestion- pt reports nasal congestion x2 weeks.  No relief w/ OTC products (Afrin).  Nose will bleed when blowing.  No fevers.  No sinus pain.  + fatigue.  Mild HA.  Mild cough due to PND.  No known sick contacts.     Review of Systems For ROS see HPI     Objective:   Physical Exam  Constitutional: She is oriented to person, place, and time. She appears well-developed and well-nourished. No distress.  HENT:  Head: Normocephalic and atraumatic.  Right Ear: Tympanic membrane normal.  Left Ear: Tympanic membrane normal.  Nose: Mucosal edema and rhinorrhea present. Right sinus exhibits no maxillary sinus tenderness and no frontal sinus tenderness. Left sinus exhibits no maxillary sinus tenderness and no frontal sinus tenderness.  Mouth/Throat: Mucous membranes are normal. Posterior oropharyngeal erythema (w/ PND) present.  Eyes: Conjunctivae and EOM are normal. Pupils are equal, round, and reactive to light.  Neck: Normal range of motion. Neck supple.  Cardiovascular: Normal rate, regular rhythm and normal heart sounds.  Pulmonary/Chest: Effort normal and breath sounds normal. No respiratory distress. She has no wheezes. She has no rales.  Lymphadenopathy:    She has no cervical adenopathy.  Neurological: She is alert and oriented to person, place, and time.  Psychiatric: She has a normal mood and affect. Her behavior is normal. Thought content normal.  Vitals reviewed.         Assessment & Plan:  Nasal congestion- new.  Likely due to untreated allergic rhinitis.  Will avoid decongestants due to HTN.  Start Zyrtec and Flonase.  Reviewed supportive care and red flags that should prompt return.  Pt expressed understanding and is in agreement w/ plan.

## 2017-08-26 NOTE — Patient Instructions (Signed)
Follow up as needed or as scheduled Start the Flonase- 2 sprays each nostril once daily Start Zyrtec daily to decrease your post-nasal drip Drink plenty of fluids REST! Call with any questions or concerns Hang in there!!!

## 2017-08-28 ENCOUNTER — Ambulatory Visit: Payer: PPO | Admitting: Physical Therapy

## 2017-08-28 DIAGNOSIS — M5441 Lumbago with sciatica, right side: Secondary | ICD-10-CM

## 2017-08-28 DIAGNOSIS — M25551 Pain in right hip: Secondary | ICD-10-CM

## 2017-08-28 DIAGNOSIS — M6281 Muscle weakness (generalized): Secondary | ICD-10-CM

## 2017-08-28 DIAGNOSIS — M62838 Other muscle spasm: Secondary | ICD-10-CM

## 2017-08-28 NOTE — Patient Instructions (Addendum)
Walk on a treadmill for 15 min or bike Set up you gym membership    Stand with spine straight and arms positioned on counter. Square hips to the front. Extend one leg behind you so that your toes are touching the floor, gently bend that knee, then straighten, squeezing buttock, knee cap up toward head. Bend knee and repeat.  You should feel this in your gluteal muscles! 10 x each leg    While standing, raise your leg out to the side. Keep your knee straight and maintain your toes pointed forward the entire time.    Use your arms for support if needed for balance and safety.  15 times each leg.   [facility] [address] [place], [place] [address] Phone # [phone] Fax [phone]

## 2017-08-28 NOTE — Therapy (Signed)
[facility] [address] [place], [place], [address] Phone: [phone]   Fax:  [phone]  Physical Therapy Treatment  Patient Details  Name: Karen Gonzales MRN: [id number] Date of Birth: [birth year] Referring Provider: Billey Chang, MD    Encounter Date: 08/28/2017  PT End of Session - 08/28/17 1442    Visit Number  5    Number of Visits  16    Date for PT Re-Evaluation  10/07/17    Authorization Type  [employer]    Authorization Time Period  08/12/17 to 10/07/17    PT Start Time  1400    PT Stop Time  1442    PT Time Calculation (min)  42 min    Activity Tolerance  Patient tolerated treatment well;No increased pain    Behavior During Therapy  WFL for tasks assessed/performed       Past Medical History:  Diagnosis Date  . Anti-cardiolipin antibody syndrome ([place]) 06/25/2011  . Arthritis   . Blood transfusion    had when knee was replaced  . Bronchitis    history of  . Chills   . Complication of anesthesia   . Cough   . Diabetes mellitus   . DVT (deep venous thrombosis) ([place]) 06/25/2011  . Fatty liver disease, nonalcoholic    determinde by Ultrasound 11/11  . GERD (gastroesophageal reflux disease)   . H/O vitamin D deficiency   . Heart murmur   . Heart murmur    Benign  . Hiatal hernia   . Hyperlipidemia   . Hypertension   . Hyponatremia    with diuretic use  . Osteopenia   . PONV (postoperative nausea and vomiting)   . Vertigo     Past Surgical History:  Procedure Laterality Date  . ABDOMINAL HYSTERECTOMY    . ACHILLES TENDON SURGERY     bilateral   . CARPAL TUNNEL RELEASE     bilateral   . CHOLECYSTECTOMY  1969   open  . CRANIOTOMY  07/09/2011   Procedure: CRANIOTOMY HEMATOMA EVACUATION SUBDURAL;  Surgeon: Cooper Render Pool;  Location: [facility];  Service: Neurosurgery;  Laterality: Right;  Excision of Skull Lesion-Right Right Parietal Crani w/ Resection of Skull Tumor (1 1/2  hours - to follow)  . DILATION AND CURETTAGE OF UTERUS    . JOINT REPLACEMENT  2004   left knee replacement   . KNEE SURGERY     total of 6 surgeries on both knees   . TUBAL LIGATION    . TUMOR REMOVAL     on pelvic bone     There were no vitals filed for this visit.  Subjective Assessment - 08/28/17 1404    Subjective  My pain is 80% better.  I can sit and lay in the bed without it hurting.     Limitations  Walking    How long can you sit comfortably?  unlimited     Patient Stated Goals  improve pain with walking     Currently in Pain?  No/denies    Multiple Pain Sites  No         [facility] PT Assessment - 08/28/17 0001      Strength   Right Hip Flexion  4+/5    Right Hip Extension  3+/5    Right Hip ABduction  3+/5    Left Hip Flexion  4+/5    Left Hip Extension  3+/5    Left Hip ABduction  4/5  Right Knee Flexion  3+/5    Right Knee Extension  5/5    Left Knee Flexion  3+/5                  [facility] Adult PT Treatment/Exercise - 08/28/17 0001      Lumbar Exercises: Stretches   Active Hamstring Stretch  10 seconds;Limitations    Active Hamstring Stretch Limitations  x10 reps each LE    Piriformis Stretch  1 rep;30 seconds;Limitations    Piriformis Stretch Limitations  Lt/Rt seated      Lumbar Exercises: Aerobic   Stationary Bike  nustep 5 min, level 2, seat #7, arm #11      Lumbar Exercises: Machines for Strengthening   Cybex Knee Extension  15# 2x5    Leg Press  both legs; seat #5; 40# 2x15      Lumbar Exercises: Standing   Other Standing Lumbar Exercises  lean on counter hip extension 2x5 with abdominal bracing    Other Standing Lumbar Exercises  standing hip abduction 15x1 bil.       Manual Therapy   Manual Therapy  Soft tissue mobilization    Manual therapy comments  assistive device soft tissue work to right quandratus and gluteal    Soft tissue mobilization  right quadratus, intercoastals of right lateral rib #10,11,12, right gluteal, ,right  piriformins             PT Education - 08/28/17 1431    Education provided  Yes    Education Details  hip extension and abduction in standing    Person(s) Educated  Patient    Methods  Explanation;Demonstration;Verbal cues;Handout    Comprehension  Returned demonstration;Verbalized understanding       PT Short Term Goals - 08/23/17 1009      PT SHORT TERM GOAL #1   Title  Pt will demo consistency and independence with her HEP in order to decrease pain and improve LE strength.    Time  2    Period  Weeks    Status  Achieved      PT SHORT TERM GOAL #2   Title  Pt will demo improved hamstring and quadriceps flexibility by atleast 10deg which will improve her alignment in sitting and standing positions.     Time  4    Period  Weeks    Status  Achieved      PT SHORT TERM GOAL #3   Title  Pt will report atleast 30% decrease in her pain with walking, to improve her ability to walk her dog as needed.     Time  4    Period  Weeks    Status  Achieved        PT Long Term Goals - 08/28/17 1445      PT LONG TERM GOAL #1   Title  Pt will demo improved BLE strength to atleast 4/5 MMT which will improve her safety and efficiency with daily activity.     Time  8    Period  Weeks    Status  On-going      PT LONG TERM GOAL #2   Title  Pt will complete 5x sit to stand in less than 13 sec without UE support or significant weight shift, to reflect improvements in her functional strength/power.    Time  8    Period  Weeks    Status  On-going      PT LONG TERM GOAL #3   Title  Pt will report decrease in symptoms and ability to walk atleast 20 min without need for breaks or increase in pain to allow her to resume regular fitness routine.     Time  8    Period  Weeks    Status  On-going      PT LONG TERM GOAL #4   Title  Pt will report atleast 60% improvement in her pain and symptoms from the start of therapy, which will allow her to complete activitiy throughout the day without  need for assistance.     Time  8    Period  Weeks    Status  On-going            Plan - 08/28/17 1442    Clinical Impression Statement  Patient is 85% better.  She had increased tenderness located in right upper quadratus.  Patient has started exercises she is able to do at the gym to work on a home program.  Patient was a little SOB with exercise due to being deconditioned.  Patient will benefit from skilled therapy to reduce pain and improve muscle strength.     Rehab Potential  Good    Clinical Impairments Affecting Rehab Potential  history of right hip bursitis    PT Frequency  2x / week    PT Treatment/Interventions  ADLs/Self Care Home Management;Electrical Stimulation;Cryotherapy;Moist Heat;Traction;Balance training;Therapeutic exercise;Therapeutic activities;Functional mobility training;Stair training;Gait training;Neuromuscular re-education;Patient/family education;Manual techniques;Passive range of motion;Dry needling;Taping    PT Next Visit Plan  manual techniques to address soft tissue restrictions along the hip; gentle hip strengthening; see if patient wants to continue further out with therapy. Cert up on 1/56/1537    PT Home Exercise Plan  progress as needed    Consulted and Agree with Plan of Care  Patient       Patient will benefit from skilled therapeutic intervention in order to improve the following deficits and impairments:  Abnormal gait, Decreased activity tolerance, Decreased balance, Difficulty walking, Impaired flexibility, Decreased strength, Decreased range of motion, Decreased endurance, Decreased mobility, Postural dysfunction, Increased muscle spasms, Pain, Improper body mechanics  Visit Diagnosis: Pain in right hip  Acute right-sided low back pain with right-sided sciatica  Muscle weakness (generalized)  Other muscle spasm     Problem List Patient Active Problem List   Diagnosis Date Noted  . Physical exam 10/10/2016  . Gastroparesis due to DM  ([place]) 06/05/2016  . Right shoulder pain 02/03/2016  . Diabetes mellitus type II, non insulin dependent ([place]) 11/07/2015  . Hyperlipidemia 11/07/2015  . Skull mass 07/02/2011  . Fibromyalgia 06/25/2011  . Essential hypertension 09/24/2007    Earlie Counts, PT 08/28/17 2:46 PM   [facility] [address] [place], [place], [address] Phone: [phone]   Fax:  [phone]  Name: ANJULI GEMMILL MRN: [id number] Date of Birth: 06-Aug-1946

## 2017-08-29 ENCOUNTER — Other Ambulatory Visit: Payer: Self-pay | Admitting: Family Medicine

## 2017-09-04 ENCOUNTER — Ambulatory Visit: Payer: PPO | Admitting: Physical Therapy

## 2017-09-04 ENCOUNTER — Encounter: Payer: Self-pay | Admitting: Physical Therapy

## 2017-09-04 DIAGNOSIS — M25551 Pain in right hip: Secondary | ICD-10-CM

## 2017-09-04 DIAGNOSIS — M6281 Muscle weakness (generalized): Secondary | ICD-10-CM

## 2017-09-04 DIAGNOSIS — M5441 Lumbago with sciatica, right side: Secondary | ICD-10-CM

## 2017-09-04 DIAGNOSIS — M62838 Other muscle spasm: Secondary | ICD-10-CM

## 2017-09-04 NOTE — Patient Instructions (Signed)
   Side Steps: Band around Feet/ at kitchen counter  Place a band around your feet.  With your knees slightly bent and toes pointed forward, step sideways to the end of the countertop and return to the starting position facing the same direction.  **IMPORTANT** Keep tension in the band the entire time by taking a big step with leading foot, then, a smaller step with the trailing foot.  Remember to shift your weight back on the heels of your feet but do not let your toes come off of the ground.     x3 reps.  [facility] [address] [place], [place] [address] Phone # [phone] Fax [phone]

## 2017-09-04 NOTE — Therapy (Signed)
[facility] [address] [place], [place], [address] Phone: [phone]   Fax:  [phone]  Physical Therapy Treatment  Patient Details  Name: Karen Gonzales MRN: [id number] Date of Birth: 1946/04/30 Referring Provider: Billey Chang, MD    Encounter Date: 09/04/2017  PT End of Session - 09/04/17 1048    Visit Number  6    Number of Visits  16    Date for PT Re-Evaluation  10/07/17    Authorization Type  [employer]    Authorization Time Period  08/12/17 to 10/07/17    PT Start Time  1017    PT Stop Time  1055    PT Time Calculation (min)  38 min    Activity Tolerance  Patient tolerated treatment well;No increased pain    Behavior During Therapy  WFL for tasks assessed/performed       Past Medical History:  Diagnosis Date  . Anti-cardiolipin antibody syndrome ([place]) 06/25/2011  . Arthritis   . Blood transfusion    had when knee was replaced  . Bronchitis    history of  . Chills   . Complication of anesthesia   . Cough   . Diabetes mellitus   . DVT (deep venous thrombosis) ([place]) 06/25/2011  . Fatty liver disease, nonalcoholic    determinde by Ultrasound 11/11  . GERD (gastroesophageal reflux disease)   . H/O vitamin D deficiency   . Heart murmur   . Heart murmur    Benign  . Hiatal hernia   . Hyperlipidemia   . Hypertension   . Hyponatremia    with diuretic use  . Osteopenia   . PONV (postoperative nausea and vomiting)   . Vertigo     Past Surgical History:  Procedure Laterality Date  . ABDOMINAL HYSTERECTOMY    . ACHILLES TENDON SURGERY     bilateral   . CARPAL TUNNEL RELEASE     bilateral   . CHOLECYSTECTOMY  1969   open  . CRANIOTOMY  07/09/2011   Procedure: CRANIOTOMY HEMATOMA EVACUATION SUBDURAL;  Surgeon: Cooper Render Pool;  Location: [facility];  Service: Neurosurgery;  Laterality: Right;  Excision of Skull Lesion-Right Right Parietal Crani w/ Resection of Skull Tumor (1 1/2  hours - to follow)  . DILATION AND CURETTAGE OF UTERUS    . JOINT REPLACEMENT  2004   left knee replacement   . KNEE SURGERY     total of 6 surgeries on both knees   . TUBAL LIGATION    . TUMOR REMOVAL     on pelvic bone     There were no vitals filed for this visit.  Subjective Assessment - 09/04/17 1020    Subjective  Pt reports that she is doing well. She was able to go shopping Friday for several hours. She seemed to do pretty well when she was able to rest. When she did alot of walking the following day on concrete floors, she noticed some increased soreness the following day.     Limitations  Walking    How long can you sit comfortably?  unlimited     How long can you walk comfortably?  atleast 15 min but thinks she could go longer.     Patient Stated Goals  improve pain with walking     Currently in Pain?  No/denies              [facility] Adult PT Treatment/Exercise - 09/04/17 0001  Lumbar Exercises: Stretches   Passive Hamstring Stretch  Right;Left;2 reps;30 seconds;Other (comment) seated    Single Knee to Chest Stretch  Right;5 reps;10 seconds      Lumbar Exercises: Aerobic   Stationary Bike  Nustep x5 min,       Lumbar Exercises: Machines for Strengthening   Leg Press  Seat #5, BLE 60# 2x10 reps; RLE only 2x10 reps with 30#      Lumbar Exercises: Standing   Other Standing Lumbar Exercises  lean on counter hip extension 2x10 with abdominal bracing    Other Standing Lumbar Exercises  sidestepping with yellow band around knees x3 reps each direction [id number]      Lumbar Exercises: Seated   Sit to Stand  5 reps;Limitations    Sit to Stand Limitations  x2 sets             PT Education - 09/04/17 1047    Education provided  Yes    Education Details  discussed goals and progress; discussed 1 appointment near end of POC to ensure pt is able to progress strength and flexibility independently prior to discharge.     Person(s) Educated  Patient    Methods   Explanation    Comprehension  Verbalized understanding       PT Short Term Goals - 09/04/17 1025      PT SHORT TERM GOAL #1   Title  Pt will demo consistency and independence with her HEP in order to decrease pain and improve LE strength.    Time  2    Period  Weeks    Status  Achieved      PT SHORT TERM GOAL #2   Title  Pt will demo improved hamstring and quadriceps flexibility by atleast 10deg which will improve her alignment in sitting and standing positions.     Time  4    Period  Weeks    Status  Achieved      PT SHORT TERM GOAL #3   Title  Pt will report atleast 30% decrease in her pain with walking, to improve her ability to walk her dog as needed.     Time  4    Period  Weeks    Status  Achieved        PT Long Term Goals - 09/04/17 1025      PT LONG TERM GOAL #1   Title  Pt will demo improved BLE strength to atleast 4/5 MMT which will improve her safety and efficiency with daily activity.     Time  8    Period  Weeks    Status  On-going      PT LONG TERM GOAL #2   Title  Pt will complete 5x sit to stand in less than 13 sec without UE support or significant weight shift, to reflect improvements in her functional strength/power.    Baseline  8 sec, no weight shift and no valgus noted     Time  8    Period  Weeks    Status  Achieved      PT LONG TERM GOAL #3   Title  Pt will report decrease in symptoms and ability to walk atleast 20 min without need for breaks or increase in pain to allow her to resume regular fitness routine.     Baseline  atleast 20 min walking     Time  8    Period  Weeks    Status  Achieved  PT LONG TERM GOAL #4   Title  Pt will report atleast 60% improvement in her pain and symptoms from the start of therapy, which will allow her to complete activitiy throughout the day without need for assistance.     Baseline  80% improvement     Time  8    Period  Weeks    Status  Achieved            Plan - 09/04/17 1055    Clinical  Impression Statement  Pt is making great progress towards her goals, having met all but one of her long term goals at this point in her POC. She is able to complete 5x sit to stand in 8 sec without any difficulty and has recently started walking for exercise up to 15-20 min without any difficulty. Pt does have significant weakness in the hip musculature Rt>Lt which will increase her risk of return of symptoms in the future. Pt is agreeable to a small break in her POC to allow for her to gain independence with her HEP and prepare for possible d/c next visit assuming no issues/concerns arise.     Rehab Potential  Good    Clinical Impairments Affecting Rehab Potential  history of right hip bursitis    PT Frequency  2x / week    PT Treatment/Interventions  ADLs/Self Care Home Management;Electrical Stimulation;Cryotherapy;Moist Heat;Traction;Balance training;Therapeutic exercise;Therapeutic activities;Functional mobility training;Stair training;Gait training;Neuromuscular re-education;Patient/family education;Manual techniques;Passive range of motion;Dry needling;Taping    PT Next Visit Plan  f/u on HEP; check LE strength and plan for d/c     PT Home Exercise Plan  added sidestepping with yellow TB around feet     Consulted and Agree with Plan of Care  Patient       Patient will benefit from skilled therapeutic intervention in order to improve the following deficits and impairments:  Abnormal gait, Decreased activity tolerance, Decreased balance, Difficulty walking, Impaired flexibility, Decreased strength, Decreased range of motion, Decreased endurance, Decreased mobility, Postural dysfunction, Increased muscle spasms, Pain, Improper body mechanics  Visit Diagnosis: Pain in right hip  Acute right-sided low back pain with right-sided sciatica  Muscle weakness (generalized)  Other muscle spasm     Problem List Patient Active Problem List   Diagnosis Date Noted  . Physical exam 10/10/2016  .  Gastroparesis due to DM ([place]) 06/05/2016  . Right shoulder pain 02/03/2016  . Diabetes mellitus type II, non insulin dependent ([place]) 11/07/2015  . Hyperlipidemia 11/07/2015  . Skull mass 07/02/2011  . Fibromyalgia 06/25/2011  . Essential hypertension 09/24/2007   11:32 AM,09/04/17 Sherol Dade PT, DPT [facility]  [facility] [address] [place], [place], [address] Phone: [phone]   Fax:  [phone]  Name: Karen Gonzales MRN: [id number] Date of Birth: [birth year]

## 2017-09-22 ENCOUNTER — Other Ambulatory Visit: Payer: Self-pay | Admitting: Family Medicine

## 2017-09-24 ENCOUNTER — Encounter: Payer: Self-pay | Admitting: Physical Therapy

## 2017-09-24 ENCOUNTER — Ambulatory Visit: Payer: PPO | Attending: Family Medicine | Admitting: Physical Therapy

## 2017-09-24 DIAGNOSIS — M62838 Other muscle spasm: Secondary | ICD-10-CM | POA: Diagnosis not present

## 2017-09-24 DIAGNOSIS — M25551 Pain in right hip: Secondary | ICD-10-CM | POA: Diagnosis not present

## 2017-09-24 DIAGNOSIS — M5441 Lumbago with sciatica, right side: Secondary | ICD-10-CM | POA: Diagnosis not present

## 2017-09-24 DIAGNOSIS — M6281 Muscle weakness (generalized): Secondary | ICD-10-CM | POA: Diagnosis not present

## 2017-09-24 NOTE — Therapy (Signed)
[facility] [address] [place], [place], [address] Phone: [phone]   Fax:  [phone]  Physical Therapy Treatment/Discharge  Patient Details  Name: Karen Gonzales MRN: [id number] Date of Birth: 1946-07-29 Referring Provider: Billey Chang, MD   Encounter Date: 09/24/2017  PT End of Session - 09/24/17 1103    Visit Number  7    Number of Visits  16    Date for PT Re-Evaluation  10/07/17    Authorization Type  [employer]    Authorization Time Period  08/12/17 to 10/07/17    PT Start Time  1016    PT Stop Time  1058    PT Time Calculation (min)  42 min    Activity Tolerance  Patient tolerated treatment well;No increased pain    Behavior During Therapy  WFL for tasks assessed/performed       Past Medical History:  Diagnosis Date  . Anti-cardiolipin antibody syndrome ([place]) 06/25/2011  . Arthritis   . Blood transfusion    had when knee was replaced  . Bronchitis    history of  . Chills   . Complication of anesthesia   . Cough   . Diabetes mellitus   . DVT (deep venous thrombosis) ([place]) 06/25/2011  . Fatty liver disease, nonalcoholic    determinde by Ultrasound 11/11  . GERD (gastroesophageal reflux disease)   . H/O vitamin D deficiency   . Heart murmur   . Heart murmur    Benign  . Hiatal hernia   . Hyperlipidemia   . Hypertension   . Hyponatremia    with diuretic use  . Osteopenia   . PONV (postoperative nausea and vomiting)   . Vertigo     Past Surgical History:  Procedure Laterality Date  . ABDOMINAL HYSTERECTOMY    . ACHILLES TENDON SURGERY     bilateral   . CARPAL TUNNEL RELEASE     bilateral   . CHOLECYSTECTOMY  1969   open  . CRANIOTOMY  07/09/2011   Procedure: CRANIOTOMY HEMATOMA EVACUATION SUBDURAL;  Surgeon: Cooper Render Pool;  Location: [facility];  Service: Neurosurgery;  Laterality: Right;  Excision of Skull Lesion-Right Right Parietal Crani w/ Resection of Skull  Tumor (1 1/2 hours - to follow)  . DILATION AND CURETTAGE OF UTERUS    . JOINT REPLACEMENT  2004   left knee replacement   . KNEE SURGERY     total of 6 surgeries on both knees   . TUBAL LIGATION    . TUMOR REMOVAL     on pelvic bone     There were no vitals filed for this visit.  Subjective Assessment - 09/24/17 1024    Subjective  Pt reports that things are going well. She has not been able to complete her HEP over the past week because she was sick. She feels atleast 90% improved overall. She is able to make her bed more easily in the mornings now.     Limitations  Walking    How long can you sit comfortably?  unlimited     How long can you walk comfortably?  atleast 15 min but thinks she could go longer.     Patient Stated Goals  improve pain with walking     Currently in Pain?  No/denies         [facility] PT Assessment - 09/24/17 0001      Assessment   Medical Diagnosis  Rt sided sciatica  Referring Provider  Billey Chang, MD    Next MD Visit  none for this     Prior Therapy  none       Precautions   Precautions  None      Balance Screen   Has the patient fallen in the past 6 months  No    Has the patient had a decrease in activity level because of a fear of falling?   No    Is the patient reluctant to leave their home because of a fear of falling?   No      Prior Function   Level of Independence  Independent      AROM   Lumbar Flexion  WNL, pain free    Lumbar Extension  limited 25%, pain free      Strength   Right Hip Flexion  5/5    Right Hip Extension  5/5    Right Hip ABduction  5/5    Left Hip Flexion  5/5    Left Hip Extension  5/5    Left Hip ABduction  5/5    Right Knee Flexion  5/5    Right Knee Extension  5/5    Left Knee Flexion  5/5      Flexibility   Hamstrings  25 deg lacking BLE    Quadriceps  90 deg in prone       Transfers   Five time sit to stand comments   8 sec                  [facility] Adult PT Treatment/Exercise -  09/24/17 0001      Lumbar Exercises: Stretches   Passive Hamstring Stretch  3 reps;20 seconds;Left;Right;Limitations    Passive Hamstring Stretch Limitations  supine with strap     Hip Flexor Stretch  Right;Left;2 reps;30 seconds;Limitations    Hip Flexor Stretch Limitations  supine thomas position    Piriformis Stretch  4 reps;Left;Right;20 seconds    Piriformis Stretch Limitations  supine       Lumbar Exercises: Aerobic   Stationary Bike  Nustep x5 min, L2 PT present to discuss progress and discharge      Lumbar Exercises: Standing   Other Standing Lumbar Exercises  hamstring curls with 2# dumbbells x10 reps each    Other Standing Lumbar Exercises  sidestepping with yellow band around toes x3 reps each direction x24f      Lumbar Exercises: Seated   Sit to Stand  10 reps    Sit to Stand Limitations  x2 sets, red TB around knees and Lt leg forward to encourage weight shift Rt              PT Education - 09/24/17 1102    Education provided  Yes    Education Details  discussed goals/progress since beginning PT; importance of continuing with HEP moving forward to prevent return of symptoms    Person(s) Educated  Patient    Methods  Explanation    Comprehension  Verbalized understanding       PT Short Term Goals - 09/24/17 1021      PT SHORT TERM GOAL #1   Title  Pt will demo consistency and independence with her HEP in order to decrease pain and improve LE strength.    Time  2    Period  Weeks    Status  Achieved      PT SHORT TERM GOAL #2   Title  Pt will demo  improved hamstring and quadriceps flexibility by atleast 10deg which will improve her alignment in sitting and standing positions.     Time  4    Period  Weeks    Status  Achieved      PT SHORT TERM GOAL #3   Title  Pt will report atleast 30% decrease in her pain with walking, to improve her ability to walk her dog as needed.     Time  4    Period  Weeks    Status  Achieved        PT Long Term Goals -  09/24/17 1033      PT LONG TERM GOAL #1   Title  Pt will demo improved BLE strength to atleast 4/5 MMT which will improve her safety and efficiency with daily activity.     Baseline  5/5 MMT     Time  8    Period  Weeks    Status  Achieved      PT LONG TERM GOAL #2   Title  Pt will complete 5x sit to stand in less than 13 sec without UE support or significant weight shift, to reflect improvements in her functional strength/power.    Baseline  8 sec, no weight shift and no valgus noted     Time  8    Period  Weeks    Status  Achieved      PT LONG TERM GOAL #3   Title  Pt will report decrease in symptoms and ability to walk atleast 20 min without need for breaks or increase in pain to allow her to resume regular fitness routine.     Baseline  atleast 20 min walking     Time  8    Period  Weeks    Status  Achieved      PT LONG TERM GOAL #4   Title  Pt will report atleast 60% improvement in her pain and symptoms from the start of therapy, which will allow her to complete activitiy throughout the day without need for assistance.     Baseline  90% improvement     Time  8    Period  Weeks    Status  Achieved            Plan - 09/24/17 1104    Clinical Impression Statement  Pt was discharged this visit having met all of her short and long term goals since beginning PT. She demonstrates 5/5 BLE strength, increased hamstring flexibility lacking no more than 30 deg bilaterally, and reporting 90% improvement overall. She currently is able to complete her daily requirements and activity with minimal difficulty and periodic rest breaks, however this should continue to improve with continued HEP adherence moving forward. Pt is pleased with her progress and agreeable with d/c at this time.     Rehab Potential  Good    Clinical Impairments Affecting Rehab Potential  history of right hip bursitis    PT Frequency  2x / week    PT Treatment/Interventions  ADLs/Self Care Home  Management;Electrical Stimulation;Cryotherapy;Moist Heat;Traction;Balance training;Therapeutic exercise;Therapeutic activities;Functional mobility training;Stair training;Gait training;Neuromuscular re-education;Patient/family education;Manual techniques;Passive range of motion;Dry needling;Taping    PT Next Visit Plan  d/c home     PT Home Exercise Plan  added sidestepping with yellow TB around feet     Consulted and Agree with Plan of Care  Patient       Patient will benefit from skilled therapeutic intervention in order to improve  the following deficits and impairments:  Abnormal gait, Decreased activity tolerance, Decreased balance, Difficulty walking, Impaired flexibility, Decreased strength, Decreased range of motion, Decreased endurance, Decreased mobility, Postural dysfunction, Increased muscle spasms, Pain, Improper body mechanics  Visit Diagnosis: Pain in right hip  Acute right-sided low back pain with right-sided sciatica  Muscle weakness (generalized)  Other muscle spasm  PHYSICAL THERAPY DISCHARGE SUMMARY  Visits from Start of Care: 7  Current functional level related to goals / functional outcomes: See above for more details    Remaining deficits: See above for more details    Education / Equipment: See above for more details  Plan: Patient agrees to discharge.  Patient goals were met. Patient is being discharged due to being pleased with the current functional level.  ?????        Problem List Patient Active Problem List   Diagnosis Date Noted  . Physical exam 10/10/2016  . Gastroparesis due to DM ([place]) 06/05/2016  . Right shoulder pain 02/03/2016  . Diabetes mellitus type II, non insulin dependent ([place]) 11/07/2015  . Hyperlipidemia 11/07/2015  . Skull mass 07/02/2011  . Fibromyalgia 06/25/2011  . Essential hypertension 09/24/2007    11:10 AM,09/24/17 Sherol Dade PT, DPT [facility] [address], [place], [place], [address] Phone: [phone]   Fax:  [phone]  Name: HAMDI VARI MRN: [id number] Date of Birth: 1945/08/19

## 2017-09-25 ENCOUNTER — Other Ambulatory Visit: Payer: Self-pay | Admitting: General Practice

## 2017-09-25 MED ORDER — GABAPENTIN 300 MG PO CAPS: 600.0000 mg | ORAL_CAPSULE | Freq: Three times a day (TID) | ORAL | 1 refills | Status: DC

## 2017-10-07 IMAGING — MG DIGITAL SCREENING BILATERAL MAMMOGRAM WITH CAD
4 series · 4 of 4 positions shown · non-contrast
Comparison: Previous exam(s).

CLINICAL DATA: Screening.

EXAM:
DIGITAL SCREENING BILATERAL MAMMOGRAM WITH CAD

[L CC]
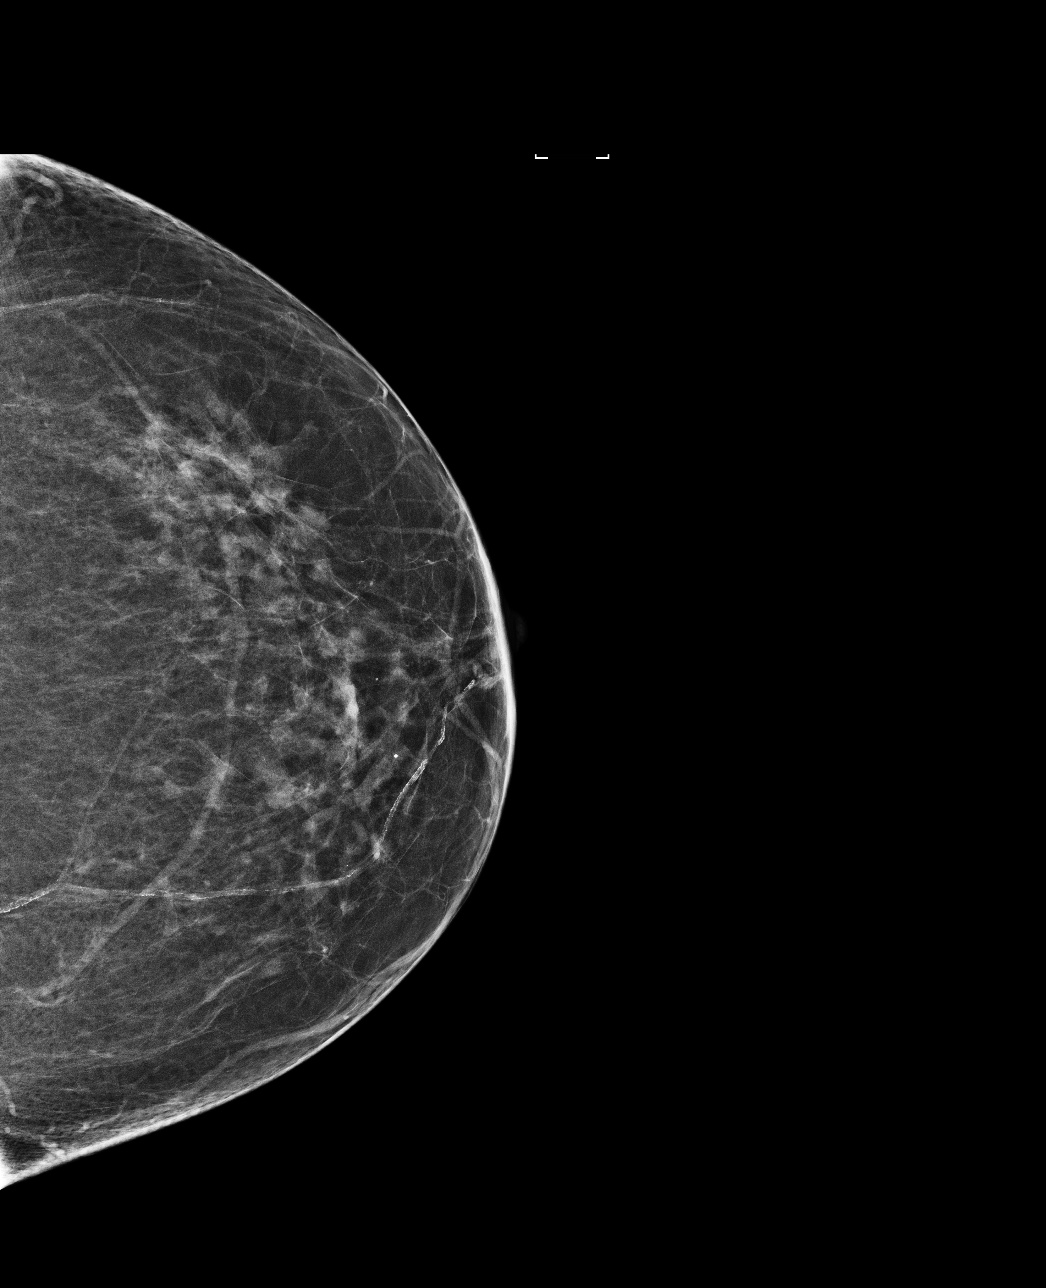

[R CC]
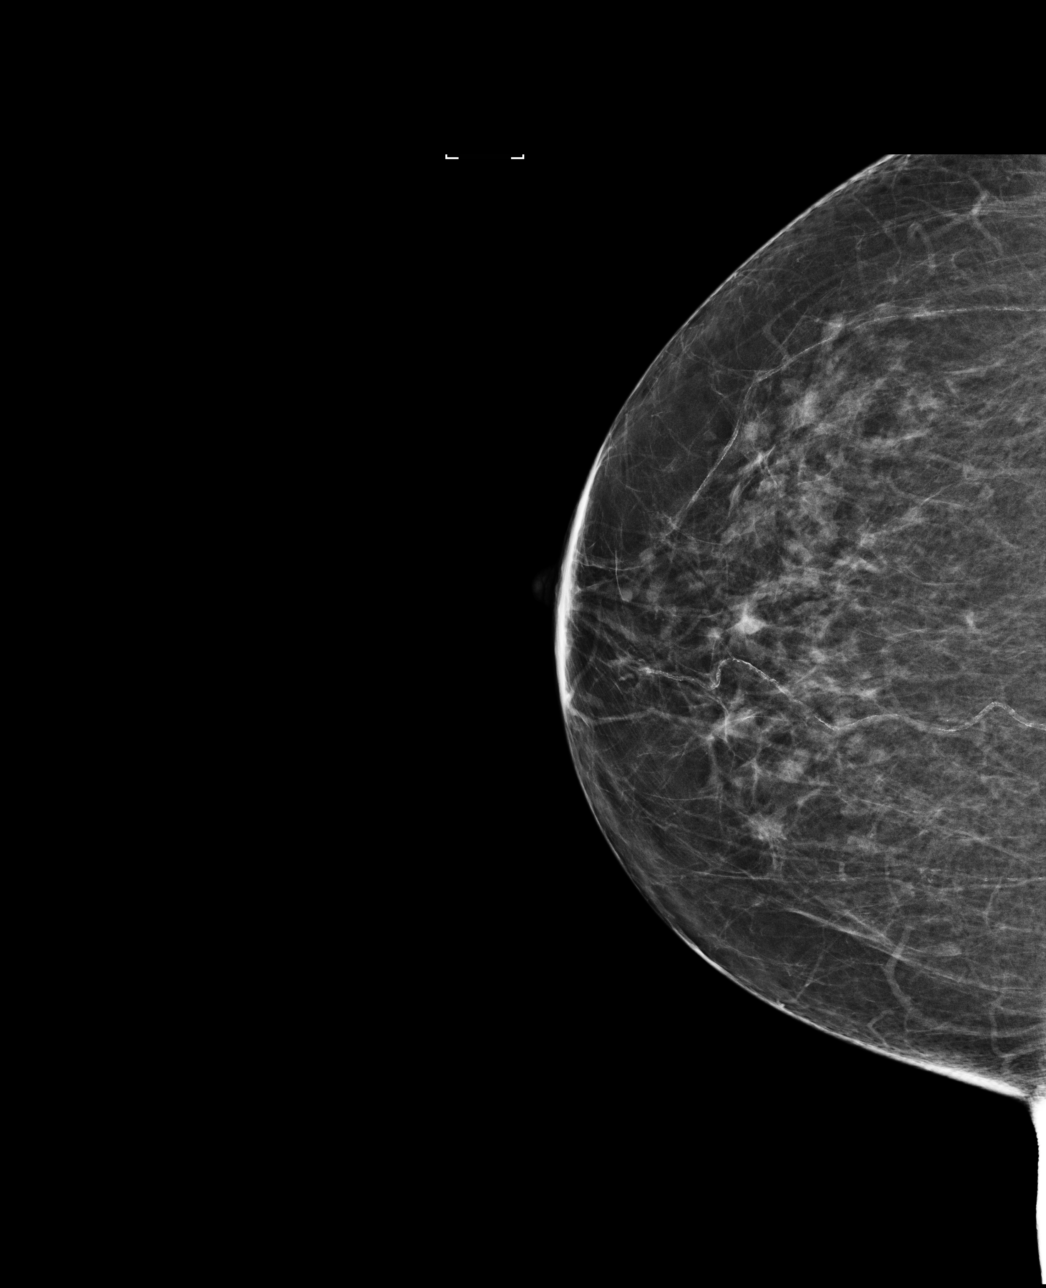

[R MLO]
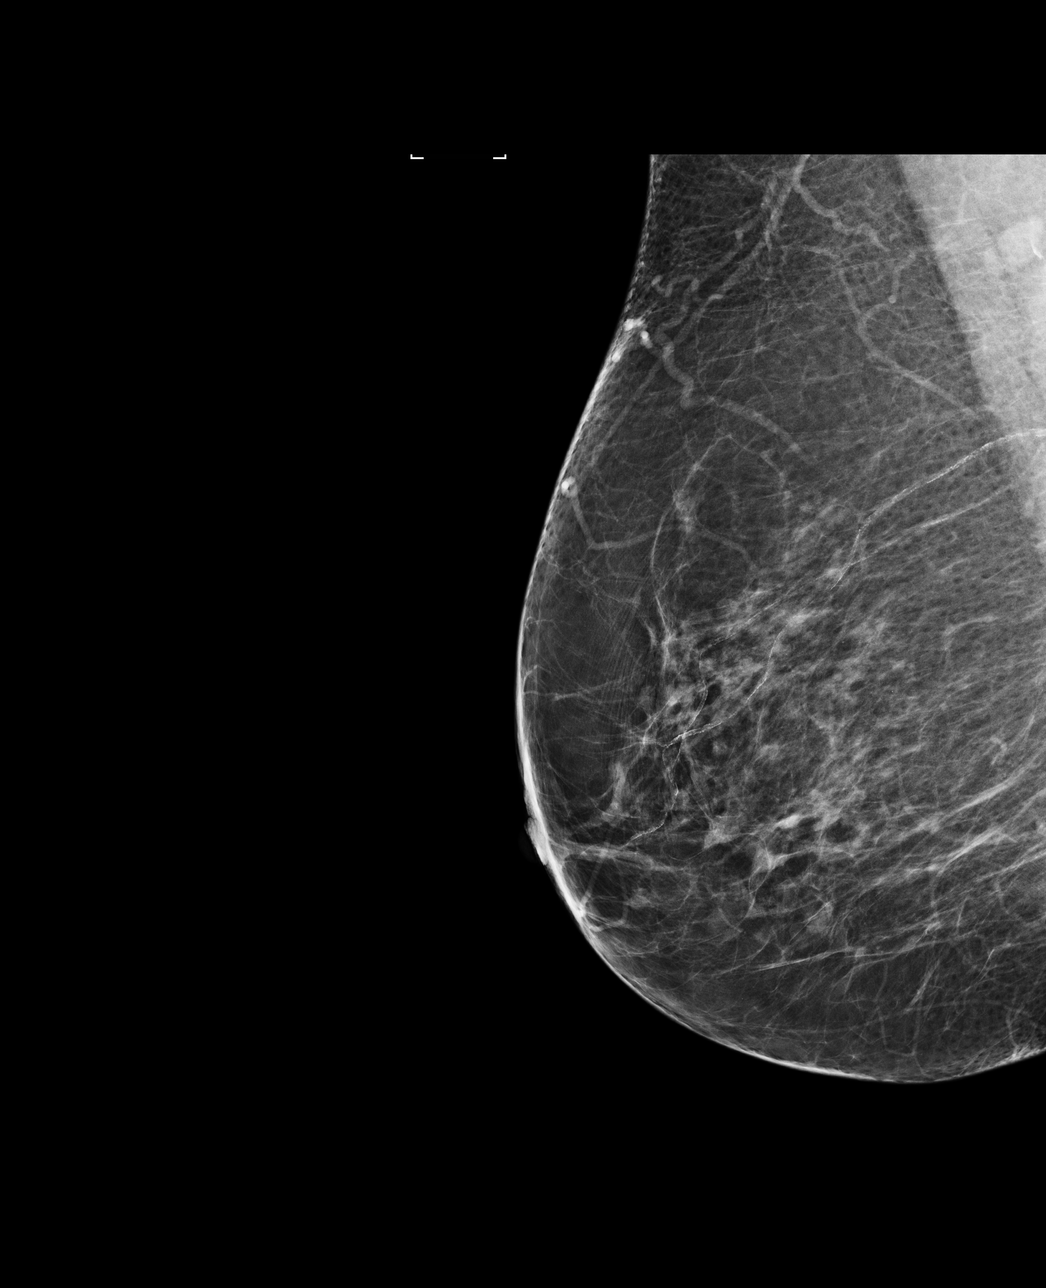

[L MLO]
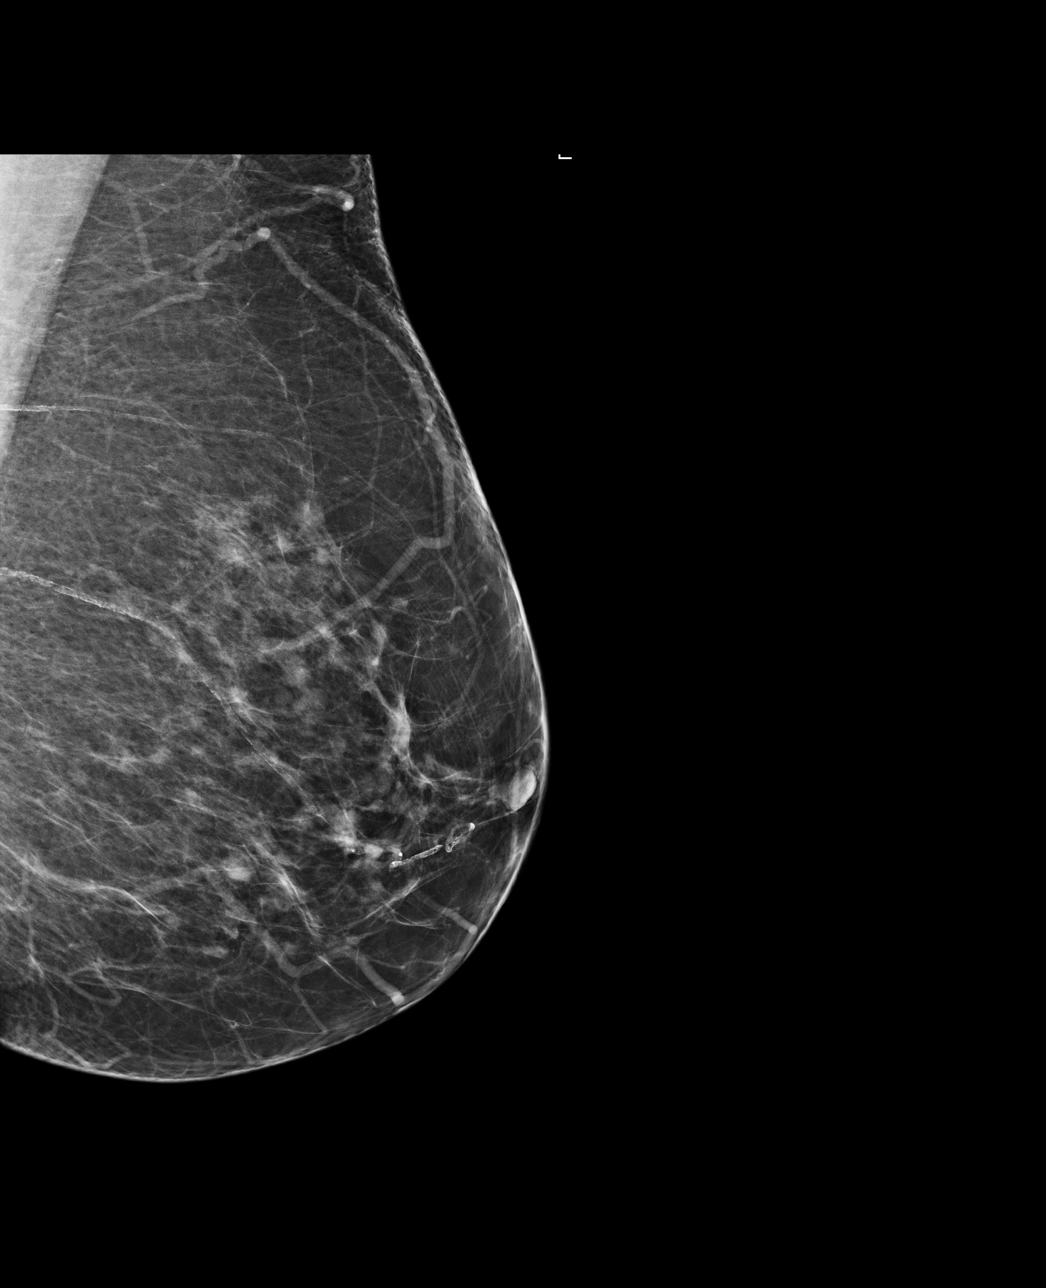

[4 of 4 positions shown; findings below may reference images not displayed]

ACR Breast Density Category b: There are scattered areas of
fibroglandular density.
FINDINGS: There are no findings suspicious for malignancy. Images were
processed with CAD.
IMPRESSION: No mammographic evidence of malignancy. A result letter of this
screening mammogram will be mailed directly to the patient.

RECOMMENDATION:
Screening mammogram in one year. (Code:AS-G-LCT)

BI-RADS CATEGORY  1: Negative.

## 2017-10-09 ENCOUNTER — Encounter: Payer: PPO | Admitting: Family Medicine

## 2017-10-23 ENCOUNTER — Encounter: Payer: PPO | Admitting: Family Medicine

## 2017-10-24 ENCOUNTER — Encounter: Payer: Self-pay | Admitting: Family Medicine

## 2017-10-24 ENCOUNTER — Ambulatory Visit (INDEPENDENT_AMBULATORY_CARE_PROVIDER_SITE_OTHER): Payer: PPO

## 2017-10-24 ENCOUNTER — Ambulatory Visit (INDEPENDENT_AMBULATORY_CARE_PROVIDER_SITE_OTHER): Payer: PPO | Admitting: Family Medicine

## 2017-10-24 ENCOUNTER — Other Ambulatory Visit: Payer: Self-pay

## 2017-10-24 VITALS — BP 158/70 | HR 84 | Temp 98.2°F | Resp 16 | Ht 65.0 in | Wt 195.0 lb

## 2017-10-24 VITALS — BP 131/82 | HR 84 | Temp 98.2°F | Resp 16 | Ht 65.0 in | Wt 195.0 lb

## 2017-10-24 DIAGNOSIS — I10 Essential (primary) hypertension: Secondary | ICD-10-CM

## 2017-10-24 DIAGNOSIS — R011 Cardiac murmur, unspecified: Secondary | ICD-10-CM | POA: Insufficient documentation

## 2017-10-24 DIAGNOSIS — E2839 Other primary ovarian failure: Secondary | ICD-10-CM | POA: Diagnosis not present

## 2017-10-24 DIAGNOSIS — E1140 Type 2 diabetes mellitus with diabetic neuropathy, unspecified: Secondary | ICD-10-CM | POA: Diagnosis not present

## 2017-10-24 DIAGNOSIS — Z1231 Encounter for screening mammogram for malignant neoplasm of breast: Secondary | ICD-10-CM | POA: Diagnosis not present

## 2017-10-24 DIAGNOSIS — Z0000 Encounter for general adult medical examination without abnormal findings: Secondary | ICD-10-CM

## 2017-10-24 DIAGNOSIS — Z1239 Encounter for other screening for malignant neoplasm of breast: Secondary | ICD-10-CM

## 2017-10-24 NOTE — Patient Instructions (Addendum)
Schedule colonoscopy with [facility] [phone].   Schedule mammogram and bone scan.   Continue doing brain stimulating activities (puzzles, reading, adult coloring books, staying active) to keep memory sharp.   Health Maintenance, Female Adopting a healthy lifestyle and getting preventive care can go a long way to promote health and wellness. Talk with your health care provider about what schedule of regular examinations is right for you. This is a good chance for you to check in with your provider about disease prevention and staying healthy. In between checkups, there are plenty of things you can do on your own. Experts have done a lot of research about which lifestyle changes and preventive measures are most likely to keep you healthy. Ask your health care provider for more information. Weight and diet Eat a healthy diet  Be sure to include plenty of vegetables, fruits, low-fat dairy products, and lean protein.  Do not eat a lot of foods high in solid fats, added sugars, or salt.  Get regular exercise. This is one of the most important things you can do for your health. ? Most adults should exercise for at least 150 minutes each week. The exercise should increase your heart rate and make you sweat (moderate-intensity exercise). ? Most adults should also do strengthening exercises at least twice a week. This is in addition to the moderate-intensity exercise.  Maintain a healthy weight  Body mass index (BMI) is a measurement that can be used to identify possible weight problems. It estimates body fat based on height and weight. Your health care provider can help determine your BMI and help you achieve or maintain a healthy weight.  For females 75 years of age and older: ? A BMI below 18.5 is considered underweight. ? A BMI of 18.5 to 24.9 is normal. ? A BMI of 25 to 29.9 is considered overweight. ? A BMI of 30 and above is considered obese.  Watch levels of cholesterol and blood  lipids  You should start having your blood tested for lipids and cholesterol at 71 years of age, then have this test every 5 years.  You may need to have your cholesterol levels checked more often if: ? Your lipid or cholesterol levels are high. ? You are older than 71 years of age. ? You are at high risk for heart disease.  Cancer screening Lung Cancer  Lung cancer screening is recommended for adults 80-40 years old who are at high risk for lung cancer because of a history of smoking.  A yearly low-dose CT scan of the lungs is recommended for people who: ? Currently smoke. ? Have quit within the past 15 years. ? Have at least a 30-pack-year history of smoking. A pack year is smoking an average of one pack of cigarettes a day for 1 year.  Yearly screening should continue until it has been 15 years since you quit.  Yearly screening should stop if you develop a health problem that would prevent you from having lung cancer treatment.  Breast Cancer  Practice breast self-awareness. This means understanding how your breasts normally appear and feel.  It also means doing regular breast self-exams. Let your health care provider know about any changes, no matter how small.  If you are in your 20s or 30s, you should have a clinical breast exam (CBE) by a health care provider every 1-3 years as part of a regular health exam.  If you are 29 or older, have a CBE every year. Also consider  having a breast X-ray (mammogram) every year.  If you have a family history of breast cancer, talk to your health care provider about genetic screening.  If you are at high risk for breast cancer, talk to your health care provider about having an MRI and a mammogram every year.  Breast cancer gene (BRCA) assessment is recommended for women who have family members with BRCA-related cancers. BRCA-related cancers include: ? Breast. ? Ovarian. ? Tubal. ? Peritoneal cancers.  Results of the assessment will  determine the need for genetic counseling and BRCA1 and BRCA2 testing.  Cervical Cancer Your health care provider may recommend that you be screened regularly for cancer of the pelvic organs (ovaries, uterus, and vagina). This screening involves a pelvic examination, including checking for microscopic changes to the surface of your cervix (Pap test). You may be encouraged to have this screening done every 3 years, beginning at age 12.  For women ages 78-65, health care providers may recommend pelvic exams and Pap testing every 3 years, or they may recommend the Pap and pelvic exam, combined with testing for human papilloma virus (HPV), every 5 years. Some types of HPV increase your risk of cervical cancer. Testing for HPV may also be done on women of any age with unclear Pap test results.  Other health care providers may not recommend any screening for nonpregnant women who are considered low risk for pelvic cancer and who do not have symptoms. Ask your health care provider if a screening pelvic exam is right for you.  If you have had past treatment for cervical cancer or a condition that could lead to cancer, you need Pap tests and screening for cancer for at least 20 years after your treatment. If Pap tests have been discontinued, your risk factors (such as having a new sexual partner) need to be reassessed to determine if screening should resume. Some women have medical problems that increase the chance of getting cervical cancer. In these cases, your health care provider may recommend more frequent screening and Pap tests.  Colorectal Cancer  This type of cancer can be detected and often prevented.  Routine colorectal cancer screening usually begins at 71 years of age and continues through 71 years of age.  Your health care provider may recommend screening at an earlier age if you have risk factors for colon cancer.  Your health care provider may also recommend using home test kits to check  for hidden blood in the stool.  A small camera at the end of a tube can be used to examine your colon directly (sigmoidoscopy or colonoscopy). This is done to check for the earliest forms of colorectal cancer.  Routine screening usually begins at age 64.  Direct examination of the colon should be repeated every 5-10 years through 71 years of age. However, you may need to be screened more often if early forms of precancerous polyps or small growths are found.  Skin Cancer  Check your skin from head to toe regularly.  Tell your health care provider about any new moles or changes in moles, especially if there is a change in a mole's shape or color.  Also tell your health care provider if you have a mole that is larger than the size of a pencil eraser.  Always use sunscreen. Apply sunscreen liberally and repeatedly throughout the day.  Protect yourself by wearing long sleeves, pants, a wide-brimmed hat, and sunglasses whenever you are outside.  Heart disease, diabetes, and high blood pressure  High blood pressure causes heart disease and increases the risk of stroke. High blood pressure is more likely to develop in: ? People who have blood pressure in the high end of the normal range (130-139/85-89 mm Hg). ? People who are overweight or obese. ? People who are African American.  If you are 4-64 years of age, have your blood pressure checked every 3-5 years. If you are 56 years of age or older, have your blood pressure checked every year. You should have your blood pressure measured twice-once when you are at a hospital or clinic, and once when you are not at a hospital or clinic. Record the average of the two measurements. To check your blood pressure when you are not at a hospital or clinic, you can use: ? An automated blood pressure machine at a pharmacy. ? A home blood pressure monitor.  If you are between 16 years and 32 years old, ask your health care provider if you should take  aspirin to prevent strokes.  Have regular diabetes screenings. This involves taking a blood sample to check your fasting blood sugar level. ? If you are at a normal weight and have a low risk for diabetes, have this test once every three years after 71 years of age. ? If you are overweight and have a high risk for diabetes, consider being tested at a younger age or more often. Preventing infection Hepatitis B  If you have a higher risk for hepatitis B, you should be screened for this virus. You are considered at high risk for hepatitis B if: ? You were born in a country where hepatitis B is common. Ask your health care provider which countries are considered high risk. ? Your parents were born in a high-risk country, and you have not been immunized against hepatitis B (hepatitis B vaccine). ? You have HIV or AIDS. ? You use needles to inject street drugs. ? You live with someone who has hepatitis B. ? You have had sex with someone who has hepatitis B. ? You get hemodialysis treatment. ? You take certain medicines for conditions, including cancer, organ transplantation, and autoimmune conditions.  Hepatitis C  Blood testing is recommended for: ? Everyone born from [birth year] through 1964. ? Anyone with known risk factors for hepatitis C.  Sexually transmitted infections (STIs)  You should be screened for sexually transmitted infections (STIs) including gonorrhea and chlamydia if: ? You are sexually active and are younger than 71 years of age. ? You are older than 71 years of age and your health care provider tells you that you are at risk for this type of infection. ? Your sexual activity has changed since you were last screened and you are at an increased risk for chlamydia or gonorrhea. Ask your health care provider if you are at risk.  If you do not have HIV, but are at risk, it may be recommended that you take a prescription medicine daily to prevent HIV infection. This is called  pre-exposure prophylaxis (PrEP). You are considered at risk if: ? You are sexually active and do not regularly use condoms or know the HIV status of your partner(s). ? You take drugs by injection. ? You are sexually active with a partner who has HIV.  Talk with your health care provider about whether you are at high risk of being infected with HIV. If you choose to begin PrEP, you should first be tested for HIV. You should then be tested every 3 months  for as long as you are taking PrEP. Pregnancy  If you are premenopausal and you may become pregnant, ask your health care provider about preconception counseling.  If you may become pregnant, take 400 to 800 micrograms (mcg) of folic acid every day.  If you want to prevent pregnancy, talk to your health care provider about birth control (contraception). Osteoporosis and menopause  Osteoporosis is a disease in which the bones lose minerals and strength with aging. This can result in serious bone fractures. Your risk for osteoporosis can be identified using a bone density scan.  If you are 40 years of age or older, or if you are at risk for osteoporosis and fractures, ask your health care provider if you should be screened.  Ask your health care provider whether you should take a calcium or vitamin D supplement to lower your risk for osteoporosis.  Menopause may have certain physical symptoms and risks.  Hormone replacement therapy may reduce some of these symptoms and risks. Talk to your health care provider about whether hormone replacement therapy is right for you. Follow these instructions at home:  Schedule regular health, dental, and eye exams.  Stay current with your immunizations.  Do not use any tobacco products including cigarettes, chewing tobacco, or electronic cigarettes.  If you are pregnant, do not drink alcohol.  If you are breastfeeding, limit how much and how often you drink alcohol.  Limit alcohol intake to no more  than 1 drink per day for nonpregnant women. One drink equals 12 ounces of beer, 5 ounces of wine, or 1 ounces of hard liquor.  Do not use street drugs.  Do not share needles.  Ask your health care provider for help if you need support or information about quitting drugs.  Tell your health care provider if you often feel depressed.  Tell your health care provider if you have ever been abused or do not feel safe at home. This information is not intended to replace advice given to you by your health care provider. Make sure you discuss any questions you have with your health care provider. Document Released: 02/12/2011 Document Revised: 01/05/2016 Document Reviewed: 05/03/2015 Elsevier Interactive Patient Education  [employer].

## 2017-10-24 NOTE — Progress Notes (Signed)
   Subjective:    Patient ID: Karen Gonzales, female    DOB: 1946-02-11, 72 y.o.   MRN: [id number]  HPI CPE- Pt is due for colonoscopy (plans to call [facility]), mammo/DEXA.  UTD on eye exam, foot exam.  On ARB for renal protection.   Review of Systems Patient reports no vision/ hearing changes, adenopathy,fever, weight change, swallowing issues, chest pain, palpitations, edema, persistant/recurrent cough, hemoptysis, gastrointestinal bleeding (melena, rectal bleeding), abdominal pain, significant heartburn, bowel changes, GU symptoms (dysuria, hematuria, incontinence), Gyn symptoms (abnormal  bleeding, pain),  syncope, focal weakness, memory loss, numbness & tingling above baseline, skin/hair/nail changes, abnormal bruising or bleeding, anxiety, or depression.   + chronic hoarseness- taking Zyrtec daily but using Flonase as needed + SOB w/ exertion    Objective:   Physical Exam General Appearance:    Alert, cooperative, no distress, appears stated age  Head:    Normocephalic, without obvious abnormality, atraumatic  Eyes:    PERRL, conjunctiva/corneas clear, EOM's intact, fundi    benign, both eyes  Ears:    Normal TM's and external ear canals, both ears  Nose:   Nares normal, septum midline, mucosa normal, no drainage    or sinus tenderness  Throat:   Lips, mucosa, and tongue normal; teeth and gums normal, copious PND  Neck:   Supple, symmetrical, trachea midline, no adenopathy;    Thyroid: no enlargement/tenderness/nodules  Back:     Symmetric, no curvature, ROM normal, no CVA tenderness  Lungs:     Clear to auscultation bilaterally, respirations unlabored  Chest Wall:    No tenderness or deformity   Heart:    Regular rate and rhythm, S1 and S2 normal, II/VI SEM at USB  Breast Exam:    Deferred to mammo  Abdomen:     Soft, non-tender, bowel sounds active all four quadrants,    no masses, no organomegaly  Genitalia:    Deferred  Rectal:    Extremities:   Extremities normal,  atraumatic, no cyanosis or edema  Pulses:   2+ and symmetric all extremities  Skin:   Skin color, texture, turgor normal, no rashes or lesions  Lymph nodes:   Cervical, supraclavicular, and axillary nodes normal  Neurologic:   CNII-XII intact, normal strength, sensation and reflexes    throughout         Assessment & Plan:

## 2017-10-24 NOTE — Progress Notes (Addendum)
Subjective:   Karen Gonzales is a 72 y.o. female who presents for Medicare Annual (Subsequent) preventive examination.  Review of Systems:  No ROS.  Medicare Wellness Visit. Additional risk factors are reflected in the social history.  Cardiac Risk Factors include: diabetes mellitus;dyslipidemia;hypertension;obesity (BMI >30kg/m2);sedentary lifestyle;advanced age (>104mn, >>30women);family history of premature cardiovascular disease   Sleep patterns: Sleeps 7 hours, takes Melatonin.  Home Safety/Smoke Alarms: Feels safe in home. Smoke alarms in place.  Living environment; residence and Firearm Safety: Lives with husband in 1 story home.  Seat Belt Safety/Bike Helmet: Wears seat belt.   Female:   Pap-N/A       Mammo-09/28/2016, BI-RADS CATEGORY  1: Negative. Ordered today.   Dexa scan-11/23/2014, Osteopenia.  Ordered today.    CCS-Colonoscopy 11/07/2007, normal. [facility]     Objective:     Vitals: BP (!) 158/70 (BP Location: Left Arm, Cuff Size: Normal)   Pulse 84   Temp 98.2 F (36.8 C) (Temporal)   Resp 16   Ht 5' 5" (1.651 m)   Wt 195 lb (88.5 kg)   SpO2 97%   BMI 32.45 kg/m   Body mass index is 32.45 kg/m.  Advanced Directives 10/24/2017 08/12/2017 07/03/2011  Does Patient Have a Medical Advance Directive? No No Patient does not have advance directive  Would patient like information on creating a medical advance directive? No - Patient declined No - Patient declined -  Pre-existing out of facility DNR order (yellow form or pink MOST form) - - No    Tobacco Social History   Tobacco Use  Smoking Status Never Smoker  Smokeless Tobacco Never Used     Counseling given: Not Answered    Past Medical History:  Diagnosis Date  . Anti-cardiolipin antibody syndrome ([facility] 06/25/2011  . Arthritis   . Blood transfusion    had when knee was replaced  . Bronchitis    history of  . Chills   . Complication of anesthesia   . Cough     . Diabetes mellitus   . DVT (deep venous thrombosis) ([facility] 06/25/2011  . Fatty liver disease, nonalcoholic    determinde by Ultrasound 11/11  . GERD (gastroesophageal reflux disease)   . H/O vitamin D deficiency   . Heart murmur   . Heart murmur    Benign  . Hiatal hernia   . Hyperlipidemia   . Hypertension   . Hyponatremia    with diuretic use  . Osteopenia   . PONV (postoperative nausea and vomiting)   . Vertigo    Past Surgical History:  Procedure Laterality Date  . ABDOMINAL HYSTERECTOMY    . ACHILLES TENDON SURGERY     bilateral   . CARPAL TUNNEL RELEASE     bilateral   . CHOLECYSTECTOMY  1969   open  . CRANIOTOMY  07/09/2011   Procedure: CRANIOTOMY HEMATOMA EVACUATION SUBDURAL;  Surgeon: HCooper RenderPool;  [facility];  Service: Neurosurgery;  Laterality: Right;  Excision of Skull Lesion-Right Right Parietal Crani w/ Resection of Skull Tumor (1 1/2 hours - to follow)  . DILATION AND CURETTAGE OF UTERUS    . JOINT REPLACEMENT  2004   left knee replacement   . KNEE SURGERY     total of 6 surgeries on both knees   . TUBAL LIGATION    . TUMOR REMOVAL     on pelvic bone    Family History  Problem Relation Age of Onset  .  Kidney disease Mother   . Osteoporosis Mother   . Heart disease Father   . Cancer Father        lymphoma  . Osteoporosis Father   . Healthy Brother   . Cancer Maternal Grandmother        lymphoma  . Breast cancer Maternal Grandmother   . Cancer Paternal Grandmother        breast  . Breast cancer Paternal Aunt   . Hodgkin's lymphoma Grandchild    Social History   Socioeconomic History  . Marital status: Married    Spouse name: None  . Number of children: None  . Years of education: None  . Highest education level: None  Social Needs  . Financial resource strain: None  . Food insecurity - worry: None  . Food insecurity - inability: None  . Transportation needs - medical: None  . Transportation needs - non-medical: None   Occupational History  . None  Tobacco Use  . Smoking status: Never Smoker  . Smokeless tobacco: Never Used  Substance and Sexual Activity  . Alcohol use: No  . Drug use: No  . Sexual activity: None  Other Topics Concern  . None  Social History Narrative  . None    Outpatient Encounter Medications as of 10/24/2017  Medication Sig  . amLODipine (NORVASC) 5 MG tablet TAKE 1 TABLET (5 MG TOTAL) BY MOUTH EVERY MORNING.  . Blood Glucose Monitoring Suppl (ONE TOUCH ULTRA 2) w/Device KIT Pt uses glucometer to check sugars twice daily. Dx E11.9  . cetirizine (ZYRTEC) 10 MG tablet Take 1 tablet (10 mg total) by mouth daily.  . fluticasone (FLONASE) 50 MCG/ACT nasal spray Place 2 sprays into both nostrils daily.  Marland Kitchen gabapentin (NEURONTIN) 300 MG capsule Take 2 capsules (600 mg total) by mouth 3 (three) times daily.  Marland Kitchen glipiZIDE (GLUCOTROL XL) 5 MG 24 hr tablet Take 1 tablet (5 mg total) by mouth daily with breakfast.  . losartan (COZAAR) 100 MG tablet TAKE 1 TABLET (100 MG TOTAL) BY MOUTH DAILY.  Marland Kitchen MELATONIN PO Take by mouth.  . metFORMIN (GLUCOPHAGE) 500 MG tablet TAKE 2 TABLETS (1,000 MG TOTAL) BY MOUTH 2 (TWO) TIMES DAILY WITH A MEAL.  Marland Kitchen omeprazole (PRILOSEC) 40 MG capsule Take 1 capsule (40 mg total) by mouth 2 (two) times daily.  . ONE TOUCH ULTRA TEST test strip CHECK BLOOD SUGAR 1-2 TIMES DAILY  . ONETOUCH DELICA LANCETS 61P MISC USE WHEN CHECKING BLOOD SUGAR AS DIRECTED  . simvastatin (ZOCOR) 10 MG tablet TAKE 1 TABLET BY MOUTH EVERYDAY AT BEDTIME  . Pyridoxine HCl (VITAMIN B6 PO) Take by mouth.  . [DISCONTINUED] beclomethasone (QVAR REDIHALER) 80 MCG/ACT inhaler Inhale 1 puff 2 (two) times daily into the lungs. (Patient not taking: Reported on 08/02/2017)  . [DISCONTINUED] traMADol (ULTRAM) 50 MG tablet Take 1 tablet (50 mg total) by mouth every 8 (eight) hours as needed. (Patient not taking: Reported on 08/26/2017)   No facility-administered encounter medications on file as of  10/24/2017.     Activities of Daily Living In your present state of health, do you have any difficulty performing the following activities: 10/24/2017 07/17/2017  Hearing? N N  Vision? N N  Difficulty concentrating or making decisions? N N  Walking or climbing stairs? N N  Dressing or bathing? N N  Doing errands, shopping? N N  Preparing Food and eating ? N -  Using the Toilet? N -  In the past six months, have you accidently  leaked urine? N -  Do you have problems with loss of bowel control? N -  Managing your Medications? N -  Managing your Finances? N -  Housekeeping or managing your Housekeeping? N -  Some recent data might be hidden    Patient Care Team: [name], MD as PCP - General (Family Medicine) Earnie Larsson, MD as Consulting Physician (Neurosurgery) Laurence Spates, MD as Consulting Physician (Gastroenterology) Paulla Dolly Tamala Fothergill, DPM as Consulting Physician (Podiatry)    Assessment:   This is a routine wellness examination for [place].  Exercise Activities and Dietary recommendations Current Exercise Habits: The patient does not participate in regular exercise at present, Exercise limited by: orthopedic condition(s)   Diet (meal preparation, eat out, water intake, caffeinated beverages, dairy products, fruits and vegetables): Drinks water.   Breakfast: cereal, coffee Lunch: sandwich, frozen dinner Dinner: Eats out often. Fried foods and pasta.   Goals    . Increase physical activity     Start going back to [facility].        Fall Risk Fall Risk  10/24/2017 07/17/2017 04/08/2017 10/10/2016 11/07/2015  Falls in the past year? _0     Depression Screen PHQ 2/9 Scores 10/24/2017 07/17/2017 04/08/2017 10/10/2016  PHQ - 2 Score 0 0 0 0  PHQ- 9 Score - 0 0 0     Cognitive Function MMSE - Mini Mental State Exam 10/24/2017  Orientation to time 5  Orientation to Place 5  Registration 3  Attention/ Calculation 5  Recall 2  Language- name 2 objects 2    Language- repeat 1  Language- follow 3 step command 3  Language- read & follow direction 1  Write a sentence 1  Copy design 1  Total score 29        Immunization History  Administered Date(s) Administered  . Influenza,inj,Quad PF,6+ Mos 06/14/2015, 05/14/2016, 04/08/2017  . Pneumococcal Conjugate-13 10/23/2013  . Pneumococcal Polysaccharide-23 11/04/2014  . Tdap 03/13/2011    Screening Tests Health Maintenance  Topic Date Due  . HEMOGLOBIN A1C  10/09/2017  . Hepatitis C Screening  11/11/2017 (Originally 1945/08/27)  . COLONOSCOPY  11/06/2017  . OPHTHALMOLOGY EXAM  11/26/2017  . FOOT EXAM  04/08/2018  . MAMMOGRAM  09/28/2018  . TETANUS/TDAP  03/12/2021  . INFLUENZA VACCINE  Completed  . DEXA SCAN  Completed  . PNA vac Low Risk Adult  Completed        Plan:    Schedule colonoscopy with [facility] [phone].   Schedule mammogram and bone scan.   Continue doing brain stimulating activities (puzzles, reading, adult coloring books, staying active) to keep memory sharp.    I have personally reviewed and noted the following in the patient's chart:   . Medical and social history . Use of alcohol, tobacco or illicit drugs  . Current medications and supplements . Functional ability and status . Nutritional status . Physical activity . Advanced directives . List of other physicians . Hospitalizations, surgeries, and ER visits in previous 12 months . Vitals . Screenings to include cognitive, depression, and falls . Referrals and appointments  In addition, I have reviewed and discussed with patient certain preventive protocols, quality metrics, and best practice recommendations. A written personalized care plan for preventive services as well as general preventive health recommendations were provided to patient.     Gerilyn Nestle, RN  10/24/2017  Reviewed documentation provided by RN and agree w/ above.  Annye Asa, MD

## 2017-10-24 NOTE — Patient Instructions (Signed)
Follow up in 3-4 months to recheck diabetes We'll notify you of your lab results and make any changes if needed We'll call you with your [place] appt for the hear murmur- EKG looked great! Call and schedule your colonoscopy w/ [facility] Make sure you are using for Flonase and Zyrtec every day to help with your hoarseness Drink plenty of fluids Call with any questions or concerns Happy Spring!!!

## 2017-10-25 ENCOUNTER — Other Ambulatory Visit: Payer: Self-pay | Admitting: General Practice

## 2017-10-25 ENCOUNTER — Encounter: Payer: Self-pay | Admitting: General Practice

## 2017-10-25 DIAGNOSIS — E119 Type 2 diabetes mellitus without complications: Secondary | ICD-10-CM

## 2017-10-25 DIAGNOSIS — D649 Anemia, unspecified: Secondary | ICD-10-CM

## 2017-10-25 LAB — HEMOGLOBIN A1C
EAG (MMOL/L): 12.4 (calc)
Hgb A1c MFr Bld: 9.4 % of total Hgb — ABNORMAL HIGH (ref <5.7)
Mean Plasma Glucose: 223 (calc)

## 2017-10-25 LAB — HEPATIC FUNCTION PANEL
AG Ratio: 1.3 (calc) (ref 1.0–2.5)
ALBUMIN MSPROF: 4.3 g/dL (ref 3.6–5.1)
ALT: 39 U/L — ABNORMAL HIGH (ref 6–29)
AST: 46 U/L — ABNORMAL HIGH (ref 10–35)
Alkaline phosphatase (APISO): 47 U/L (ref 33–130)
BILIRUBIN DIRECT: 0.1 mg/dL (ref 0.0–0.2)
BILIRUBIN TOTAL: 0.4 mg/dL (ref 0.2–1.2)
Globulin: 3.2 g/dL (calc) (ref 1.9–3.7)
Indirect Bilirubin: 0.3 mg/dL (calc) (ref 0.2–1.2)
Total Protein: 7.5 g/dL (ref 6.1–8.1)

## 2017-10-25 LAB — CBC WITH DIFFERENTIAL/PLATELET
Basophils Absolute: 118 cells/uL (ref 0–200)
Basophils Relative: 1.3 %
Eosinophils Absolute: 237 cells/uL (ref 15–500)
Eosinophils Relative: 2.6 %
HCT: 30 % — ABNORMAL LOW (ref 35.0–45.0)
Hemoglobin: 9.7 g/dL — ABNORMAL LOW (ref 11.7–15.5)
LYMPHS ABS: 2985 {cells}/uL (ref 850–3900)
MCH: 24.4 pg — ABNORMAL LOW (ref 27.0–33.0)
MCHC: 32.3 g/dL (ref 32.0–36.0)
MCV: 75.4 fL — AB (ref 80.0–100.0)
MPV: 12.4 fL (ref 7.5–12.5)
Monocytes Relative: 11.1 %
NEUTROS PCT: 52.2 %
Neutro Abs: 4750 cells/uL (ref 1500–7800)
Platelets: 268 10*3/uL (ref 140–400)
RBC: 3.98 10*6/uL (ref 3.80–5.10)
RDW: 13.4 % (ref 11.0–15.0)
Total Lymphocyte: 32.8 %
WBC: 9.1 10*3/uL (ref 3.8–10.8)
WBCMIX: 1010 {cells}/uL — AB (ref 200–950)

## 2017-10-25 LAB — LIPID PANEL
CHOLESTEROL: 142 mg/dL (ref <200)
HDL: 45 mg/dL — ABNORMAL LOW (ref >50)
LDL Cholesterol (Calc): 75 mg/dL (calc)
Non-HDL Cholesterol (Calc): 97 mg/dL (calc) (ref <130)
Total CHOL/HDL Ratio: 3.2 (calc) (ref <5.0)
Triglycerides: 139 mg/dL (ref <150)

## 2017-10-25 LAB — BASIC METABOLIC PANEL
BUN: 10 mg/dL (ref 7–25)
CO2: 25 mmol/L (ref 20–32)
CREATININE: 0.81 mg/dL (ref 0.60–0.93)
Calcium: 9.3 mg/dL (ref 8.6–10.4)
Chloride: 100 mmol/L (ref 98–110)
Glucose, Bld: 95 mg/dL (ref 65–99)
POTASSIUM: 3.9 mmol/L (ref 3.5–5.3)
SODIUM: 137 mmol/L (ref 135–146)

## 2017-10-25 LAB — TSH: TSH: 1.11 m[IU]/L (ref 0.40–4.50)

## 2017-10-25 MED ORDER — FERROUS SULFATE 325 (65 FE) MG PO TABS: 325.0000 mg | ORAL_TABLET | Freq: Every day | ORAL | 6 refills | Status: DC

## 2017-10-25 MED ORDER — EMPAGLIFLOZIN 10 MG PO TABS: 10.0000 mg | ORAL_TABLET | Freq: Every day | ORAL | 6 refills | Status: DC

## 2017-11-04 ENCOUNTER — Ambulatory Visit (HOSPITAL_COMMUNITY): Payer: PPO | Attending: Cardiovascular Disease

## 2017-11-04 ENCOUNTER — Other Ambulatory Visit: Payer: Self-pay

## 2017-11-04 DIAGNOSIS — Z8249 Family history of ischemic heart disease and other diseases of the circulatory system: Secondary | ICD-10-CM | POA: Diagnosis not present

## 2017-11-04 DIAGNOSIS — R011 Cardiac murmur, unspecified: Secondary | ICD-10-CM | POA: Insufficient documentation

## 2017-11-04 DIAGNOSIS — I350 Nonrheumatic aortic (valve) stenosis: Secondary | ICD-10-CM | POA: Insufficient documentation

## 2017-11-04 DIAGNOSIS — E785 Hyperlipidemia, unspecified: Secondary | ICD-10-CM | POA: Insufficient documentation

## 2017-11-04 DIAGNOSIS — I10 Essential (primary) hypertension: Secondary | ICD-10-CM | POA: Insufficient documentation

## 2017-11-04 DIAGNOSIS — E119 Type 2 diabetes mellitus without complications: Secondary | ICD-10-CM | POA: Diagnosis not present

## 2017-11-08 ENCOUNTER — Other Ambulatory Visit (INDEPENDENT_AMBULATORY_CARE_PROVIDER_SITE_OTHER): Payer: PPO

## 2017-11-08 ENCOUNTER — Other Ambulatory Visit: Payer: Self-pay | Admitting: Family Medicine

## 2017-11-08 DIAGNOSIS — D649 Anemia, unspecified: Secondary | ICD-10-CM | POA: Diagnosis not present

## 2017-11-08 DIAGNOSIS — R195 Other fecal abnormalities: Secondary | ICD-10-CM

## 2017-11-08 LAB — FECAL OCCULT BLOOD, IMMUNOCHEMICAL: Fecal Occult Bld: POSITIVE — AB

## 2017-11-10 NOTE — Assessment & Plan Note (Signed)
New.  Pt is asymptomatic.  EKG WNL.  Will get ECHO to assess new murmur.  Reviewed supportive care and red flags that should prompt return.  Pt expressed understanding and is in agreement w/ plan.

## 2017-11-10 NOTE — Assessment & Plan Note (Signed)
Chronic problem.  BP was initially elevated but this improved when repeated.  Currently asymptomatic.  No med changes at this time.

## 2017-11-10 NOTE — Assessment & Plan Note (Signed)
Pt's PE WNL w/ exception of obesity and new heart murmur.  Due for colonoscopy- pt plans to schedule w/ [facility].  Due for mammo and DEXA- orders entered.  Check labs.  Anticipatory guidance provided.

## 2017-11-10 NOTE — Assessment & Plan Note (Signed)
Chronic problem.  UTD on eye exam, foot exam.  On ARB for renal protection.  Stressed the need for healthy diet and regular exercise.  Check labs.  Adjust meds prn

## 2017-11-14 ENCOUNTER — Ambulatory Visit (INDEPENDENT_AMBULATORY_CARE_PROVIDER_SITE_OTHER): Payer: PPO | Admitting: Family Medicine

## 2017-11-14 ENCOUNTER — Other Ambulatory Visit: Payer: Self-pay

## 2017-11-14 ENCOUNTER — Encounter: Payer: Self-pay | Admitting: Family Medicine

## 2017-11-14 VITALS — BP 122/82 | HR 76 | Temp 98.2°F | Resp 16 | Ht 65.0 in | Wt 189.5 lb

## 2017-11-14 DIAGNOSIS — R350 Frequency of micturition: Secondary | ICD-10-CM

## 2017-11-14 DIAGNOSIS — R300 Dysuria: Secondary | ICD-10-CM | POA: Diagnosis not present

## 2017-11-14 LAB — POCT URINALYSIS DIPSTICK
BILIRUBIN UA: NEGATIVE
Ketones, UA: NEGATIVE
NITRITE UA: NEGATIVE
PH UA: 6 (ref 5.0–8.0)
PROTEIN UA: NEGATIVE
Spec Grav, UA: 1.015 (ref 1.010–1.025)
UROBILINOGEN UA: 0.2 U/dL

## 2017-11-14 MED ORDER — CEPHALEXIN 500 MG PO CAPS: 500.0000 mg | ORAL_CAPSULE | Freq: Two times a day (BID) | ORAL | 0 refills | Status: AC

## 2017-11-14 NOTE — Patient Instructions (Signed)
Follow up as needed or as scheduled START the Keflex twice daily- w/ food Drink plenty of fluids Call with any questions or concerns Hang in there!!!

## 2017-11-14 NOTE — Progress Notes (Signed)
   Subjective:    Patient ID: [name], female    DOB: 1945/11/23, 72 y.o.   MRN: [id number]  HPI Urinary frequency- sxs started yesterday.  + pain w/ urination and then suprapubic pain after urination.  No fevers or chills.  No N/V.  + urgency, sense of incomplete emptying.  No blood in urine   Review of Systems For ROS see HPI     Objective:   Physical Exam  Constitutional: She is oriented to person, place, and time. She appears well-developed and well-nourished. No distress.  Abdominal: Soft. She exhibits no distension. There is no tenderness (no suprapubic or CVA tenderness).  Neurological: She is alert and oriented to person, place, and time.  Skin: Skin is warm and dry.  Psychiatric: She has a normal mood and affect. Her behavior is normal. Thought content normal.  Vitals reviewed.         Assessment & Plan:  UTI- new.  Pt's sxs and UA consistent w/ infxn.  Start abx.  Encourage fluids.  Reviewed supportive care and red flags that should prompt return.  Pt expressed understanding and is in agreement w/ plan.

## 2017-11-15 LAB — URINE CULTURE
MICRO NUMBER: 90418401
SPECIMEN QUALITY: ADEQUATE

## 2017-11-18 ENCOUNTER — Ambulatory Visit
Admission: RE | Admit: 2017-11-18 | Discharge: 2017-11-18 | Disposition: A | Discharge status: Home / Self Care | Payer: PPO | Source: Ambulatory Visit | Attending: Family Medicine | Admitting: Family Medicine

## 2017-11-18 DIAGNOSIS — Z780 Asymptomatic menopausal state: Secondary | ICD-10-CM | POA: Diagnosis not present

## 2017-11-18 DIAGNOSIS — Z1231 Encounter for screening mammogram for malignant neoplasm of breast: Secondary | ICD-10-CM | POA: Diagnosis not present

## 2017-11-18 DIAGNOSIS — E2839 Other primary ovarian failure: Secondary | ICD-10-CM

## 2017-11-18 DIAGNOSIS — Z1239 Encounter for other screening for malignant neoplasm of breast: Secondary | ICD-10-CM

## 2017-11-18 DIAGNOSIS — Z13820 Encounter for screening for osteoporosis: Secondary | ICD-10-CM | POA: Diagnosis not present

## 2017-11-20 DIAGNOSIS — K760 Fatty (change of) liver, not elsewhere classified: Secondary | ICD-10-CM | POA: Diagnosis not present

## 2017-11-20 DIAGNOSIS — D509 Iron deficiency anemia, unspecified: Secondary | ICD-10-CM | POA: Diagnosis not present

## 2017-11-20 DIAGNOSIS — K219 Gastro-esophageal reflux disease without esophagitis: Secondary | ICD-10-CM | POA: Diagnosis not present

## 2017-11-20 DIAGNOSIS — Z7984 Long term (current) use of oral hypoglycemic drugs: Secondary | ICD-10-CM | POA: Diagnosis not present

## 2017-11-20 DIAGNOSIS — K3184 Gastroparesis: Secondary | ICD-10-CM | POA: Diagnosis not present

## 2017-11-20 DIAGNOSIS — K921 Melena: Secondary | ICD-10-CM | POA: Diagnosis not present

## 2017-11-20 DIAGNOSIS — I10 Essential (primary) hypertension: Secondary | ICD-10-CM | POA: Diagnosis not present

## 2017-11-20 DIAGNOSIS — E119 Type 2 diabetes mellitus without complications: Secondary | ICD-10-CM | POA: Diagnosis not present

## 2017-11-21 ENCOUNTER — Other Ambulatory Visit: Payer: Self-pay | Admitting: Family Medicine

## 2017-11-25 ENCOUNTER — Other Ambulatory Visit (INDEPENDENT_AMBULATORY_CARE_PROVIDER_SITE_OTHER): Payer: PPO

## 2017-11-25 DIAGNOSIS — D649 Anemia, unspecified: Secondary | ICD-10-CM | POA: Diagnosis not present

## 2017-11-25 DIAGNOSIS — E119 Type 2 diabetes mellitus without complications: Secondary | ICD-10-CM | POA: Diagnosis not present

## 2017-11-25 LAB — CBC WITH DIFFERENTIAL/PLATELET
BASOS ABS: 0.1 10*3/uL (ref 0.0–0.1)
Basophils Relative: 1.3 % (ref 0.0–3.0)
EOS ABS: 0.2 10*3/uL (ref 0.0–0.7)
EOS PCT: 3.2 % (ref 0.0–5.0)
HCT: 34.4 % — ABNORMAL LOW (ref 36.0–46.0)
HEMOGLOBIN: 11.2 g/dL — AB (ref 12.0–15.0)
LYMPHS ABS: 2.4 10*3/uL (ref 0.7–4.0)
Lymphocytes Relative: 32.5 % (ref 12.0–46.0)
MCHC: 32.6 g/dL (ref 30.0–36.0)
MCV: 77.7 fl — ABNORMAL LOW (ref 78.0–100.0)
MONO ABS: 0.6 10*3/uL (ref 0.1–1.0)
Monocytes Relative: 8.1 % (ref 3.0–12.0)
NEUTROS PCT: 54.9 % (ref 43.0–77.0)
Neutro Abs: 4.1 10*3/uL (ref 1.4–7.7)
Platelets: 254 10*3/uL (ref 150.0–400.0)
RBC: 4.43 Mil/uL (ref 3.87–5.11)
RDW: 17.5 % — ABNORMAL HIGH (ref 11.5–15.5)
WBC: 7.4 10*3/uL (ref 4.0–10.5)

## 2017-11-25 LAB — BASIC METABOLIC PANEL
BUN: 14 mg/dL (ref 6–23)
CALCIUM: 9.5 mg/dL (ref 8.4–10.5)
CO2: 25 mEq/L (ref 19–32)
Chloride: 99 mEq/L (ref 96–112)
Creatinine, Ser: 0.69 mg/dL (ref 0.40–1.20)
GFR: 89.27 mL/min (ref >60.00)
GLUCOSE: 150 mg/dL — AB (ref 70–99)
POTASSIUM: 4.4 meq/L (ref 3.5–5.1)
Sodium: 134 mEq/L — ABNORMAL LOW (ref 135–145)

## 2017-12-03 ENCOUNTER — Encounter: Payer: Self-pay | Admitting: Family Medicine

## 2017-12-03 DIAGNOSIS — R195 Other fecal abnormalities: Secondary | ICD-10-CM | POA: Diagnosis not present

## 2017-12-03 DIAGNOSIS — D126 Benign neoplasm of colon, unspecified: Secondary | ICD-10-CM | POA: Diagnosis not present

## 2017-12-03 DIAGNOSIS — D509 Iron deficiency anemia, unspecified: Secondary | ICD-10-CM | POA: Diagnosis not present

## 2017-12-03 DIAGNOSIS — K640 First degree hemorrhoids: Secondary | ICD-10-CM | POA: Diagnosis not present

## 2017-12-03 LAB — HM COLONOSCOPY

## 2017-12-05 DIAGNOSIS — R300 Dysuria: Secondary | ICD-10-CM | POA: Diagnosis not present

## 2017-12-05 DIAGNOSIS — E119 Type 2 diabetes mellitus without complications: Secondary | ICD-10-CM | POA: Diagnosis not present

## 2017-12-05 DIAGNOSIS — D126 Benign neoplasm of colon, unspecified: Secondary | ICD-10-CM | POA: Diagnosis not present

## 2017-12-05 DIAGNOSIS — Z7984 Long term (current) use of oral hypoglycemic drugs: Secondary | ICD-10-CM | POA: Diagnosis not present

## 2017-12-27 ENCOUNTER — Encounter: Payer: Self-pay | Admitting: General Practice

## 2018-01-01 DIAGNOSIS — K219 Gastro-esophageal reflux disease without esophagitis: Secondary | ICD-10-CM | POA: Diagnosis not present

## 2018-01-01 DIAGNOSIS — E118 Type 2 diabetes mellitus with unspecified complications: Secondary | ICD-10-CM | POA: Diagnosis not present

## 2018-01-01 DIAGNOSIS — Z86010 Personal history of colonic polyps: Secondary | ICD-10-CM | POA: Diagnosis not present

## 2018-01-01 DIAGNOSIS — K3184 Gastroparesis: Secondary | ICD-10-CM | POA: Diagnosis not present

## 2018-01-01 DIAGNOSIS — Z7984 Long term (current) use of oral hypoglycemic drugs: Secondary | ICD-10-CM | POA: Diagnosis not present

## 2018-01-01 DIAGNOSIS — K760 Fatty (change of) liver, not elsewhere classified: Secondary | ICD-10-CM | POA: Diagnosis not present

## 2018-01-24 ENCOUNTER — Other Ambulatory Visit: Payer: Self-pay | Admitting: Family Medicine

## 2018-02-18 DIAGNOSIS — E119 Type 2 diabetes mellitus without complications: Secondary | ICD-10-CM | POA: Diagnosis not present

## 2018-02-18 DIAGNOSIS — H524 Presbyopia: Secondary | ICD-10-CM | POA: Diagnosis not present

## 2018-02-18 DIAGNOSIS — H5203 Hypermetropia, bilateral: Secondary | ICD-10-CM | POA: Diagnosis not present

## 2018-02-18 DIAGNOSIS — H52222 Regular astigmatism, left eye: Secondary | ICD-10-CM | POA: Diagnosis not present

## 2018-02-20 LAB — HM DIABETES EYE EXAM

## 2018-02-27 ENCOUNTER — Other Ambulatory Visit: Payer: Self-pay | Admitting: Family Medicine

## 2018-03-03 ENCOUNTER — Encounter: Payer: Self-pay | Admitting: General Practice

## 2018-03-13 ENCOUNTER — Other Ambulatory Visit: Payer: Self-pay | Admitting: Emergency Medicine

## 2018-03-13 MED ORDER — ONETOUCH DELICA LANCETS 33G MISC: 4 refills | Status: DC

## 2018-03-18 ENCOUNTER — Other Ambulatory Visit: Payer: Self-pay | Admitting: Family Medicine

## 2018-04-08 ENCOUNTER — Other Ambulatory Visit: Payer: Self-pay | Admitting: Family Medicine

## 2018-04-24 ENCOUNTER — Other Ambulatory Visit: Payer: PPO

## 2018-04-28 ENCOUNTER — Encounter: Payer: Self-pay | Admitting: Family Medicine

## 2018-04-28 ENCOUNTER — Ambulatory Visit (INDEPENDENT_AMBULATORY_CARE_PROVIDER_SITE_OTHER): Payer: PPO | Admitting: Family Medicine

## 2018-04-28 ENCOUNTER — Other Ambulatory Visit: Payer: Self-pay

## 2018-04-28 VITALS — BP 133/78 | HR 71 | Temp 98.2°F | Resp 16 | Ht 65.0 in | Wt 192.4 lb

## 2018-04-28 DIAGNOSIS — I10 Essential (primary) hypertension: Secondary | ICD-10-CM | POA: Diagnosis not present

## 2018-04-28 DIAGNOSIS — Z23 Encounter for immunization: Secondary | ICD-10-CM | POA: Diagnosis not present

## 2018-04-28 DIAGNOSIS — E785 Hyperlipidemia, unspecified: Secondary | ICD-10-CM

## 2018-04-28 DIAGNOSIS — E1140 Type 2 diabetes mellitus with diabetic neuropathy, unspecified: Secondary | ICD-10-CM

## 2018-04-28 LAB — CBC WITH DIFFERENTIAL/PLATELET
BASOS PCT: 2.3 % (ref 0.0–3.0)
Basophils Absolute: 0.1 10*3/uL (ref 0.0–0.1)
Eosinophils Absolute: 0.2 10*3/uL (ref 0.0–0.7)
Eosinophils Relative: 3.8 % (ref 0.0–5.0)
HCT: 38 % (ref 36.0–46.0)
HEMOGLOBIN: 12.6 g/dL (ref 12.0–15.0)
LYMPHS PCT: 36.3 % (ref 12.0–46.0)
Lymphs Abs: 2.4 10*3/uL (ref 0.7–4.0)
MCHC: 33.2 g/dL (ref 30.0–36.0)
MCV: 84.5 fl (ref 78.0–100.0)
MONOS PCT: 8.6 % (ref 3.0–12.0)
Monocytes Absolute: 0.6 10*3/uL (ref 0.1–1.0)
NEUTROS ABS: 3.2 10*3/uL (ref 1.4–7.7)
NEUTROS PCT: 49 % (ref 43.0–77.0)
PLATELETS: 202 10*3/uL (ref 150.0–400.0)
RBC: 4.49 Mil/uL (ref 3.87–5.11)
RDW: 14 % (ref 11.5–15.5)
WBC: 6.6 10*3/uL (ref 4.0–10.5)

## 2018-04-28 LAB — HEPATIC FUNCTION PANEL
ALT: 50 U/L — ABNORMAL HIGH (ref 0–35)
AST: 36 U/L (ref 0–37)
Albumin: 4.5 g/dL (ref 3.5–5.2)
Alkaline Phosphatase: 55 U/L (ref 39–117)
BILIRUBIN DIRECT: 0.1 mg/dL (ref 0.0–0.3)
BILIRUBIN TOTAL: 0.5 mg/dL (ref 0.2–1.2)
Total Protein: 7.6 g/dL (ref 6.0–8.3)

## 2018-04-28 LAB — TSH: TSH: 1.82 u[IU]/mL (ref 0.35–4.50)

## 2018-04-28 LAB — BASIC METABOLIC PANEL
BUN: 15 mg/dL (ref 6–23)
CALCIUM: 9.6 mg/dL (ref 8.4–10.5)
CO2: 29 meq/L (ref 19–32)
Chloride: 100 mEq/L (ref 96–112)
Creatinine, Ser: 0.65 mg/dL (ref 0.40–1.20)
GFR: 95.53 mL/min (ref >60.00)
Glucose, Bld: 135 mg/dL — ABNORMAL HIGH (ref 70–99)
Potassium: 4.4 mEq/L (ref 3.5–5.1)
SODIUM: 136 meq/L (ref 135–145)

## 2018-04-28 LAB — LIPID PANEL
Cholesterol: 156 mg/dL (ref 0–200)
HDL: 46.3 mg/dL (ref >39.00)
LDL CALC: 71 mg/dL (ref 0–99)
NonHDL: 110.06
Total CHOL/HDL Ratio: 3
Triglycerides: 193 mg/dL — ABNORMAL HIGH (ref 0.0–149.0)
VLDL: 38.6 mg/dL (ref 0.0–40.0)

## 2018-04-28 LAB — HEMOGLOBIN A1C: HEMOGLOBIN A1C: 7.4 % — AB (ref 4.6–6.5)

## 2018-04-28 NOTE — Assessment & Plan Note (Signed)
Chronic problem.  Pt was doing well on Jardiance but due to cost and the fact that pt is in the donut hole, she is not able to afford at this time.  UTD on eye exam.  On ACE for renal protection.  Foot exam done today.  Check labs.  Adjust meds prn

## 2018-04-28 NOTE — Assessment & Plan Note (Signed)
Chronic problem.  Adequate control today.  Asymptomatic.  Check labs.  No anticipated med changes.

## 2018-04-28 NOTE — Assessment & Plan Note (Signed)
Chronic problem.  Tolerating statin w/o difficulty.  Check labs.  Adjust meds prn  

## 2018-04-28 NOTE — Patient Instructions (Addendum)
Follow up in 3-4 months to recheck diabetes We'll notify you of your lab results and make any changes if needed Continue to work on healthy diet and regular exercise- you look great! Call with any questions or concerns Happy Fall!!!

## 2018-04-28 NOTE — Progress Notes (Signed)
   Subjective:    Patient ID: Karen Gonzales, female    DOB: 11/20/1945, 72 y.o.   MRN: [id number]  HPI HTN- chronic problem, on Amlodipine 79m, Losartan 105mdaily w/ adequate control.  No CP, SOB, HAs, visual changes, edema.  Hyperlipidemia- chronic problem, on Simvastatin 1014maily.  Hx of good control w/ last LDL 75.  No abd pain, N/V.  DM- chronic problem, on Jardiance 57m47mlipizide 5mg,58mtformin 1000mg 39m  Last A1C 9.4 and she was started on Jardiance at that time.  She is not taking this due to cost (donut hole).  UTD on eye exam, on ACE for renal protection.  Due for foot exam.  No symptomatic lows.  + chronic neuropathy.   Review of Systems For ROS see HPI     Objective:   Physical Exam  Constitutional: She is oriented to person, place, and time. She appears well-developed and well-nourished. No distress.  HENT:  Head: Normocephalic and atraumatic.  Eyes: Pupils are equal, round, and reactive to light. Conjunctivae and EOM are normal.  Neck: Normal range of motion. Neck supple. No thyromegaly present.  Cardiovascular: Normal rate, regular rhythm and normal heart sounds.  No murmur heard. Diminished DP pulses bilaterally  Pulmonary/Chest: Effort normal and breath sounds normal. No respiratory distress.  Abdominal: Soft. She exhibits no distension. There is no tenderness.  Musculoskeletal: She exhibits no edema.  Lymphadenopathy:    She has no cervical adenopathy.  Neurological: She is alert and oriented to person, place, and time.  Skin: Skin is warm and dry.  Psychiatric: She has a normal mood and affect. Her behavior is normal.  Vitals reviewed.         Assessment & Plan:

## 2018-04-29 ENCOUNTER — Other Ambulatory Visit: Payer: Self-pay | Admitting: Family Medicine

## 2018-05-21 ENCOUNTER — Other Ambulatory Visit: Payer: Self-pay | Admitting: Family Medicine

## 2018-05-22 ENCOUNTER — Other Ambulatory Visit: Payer: Self-pay | Admitting: Family Medicine

## 2018-05-26 ENCOUNTER — Other Ambulatory Visit: Payer: Self-pay | Admitting: Family Medicine

## 2018-07-02 ENCOUNTER — Other Ambulatory Visit: Payer: Self-pay | Admitting: Family Medicine

## 2018-08-04 ENCOUNTER — Other Ambulatory Visit: Payer: Self-pay | Admitting: Family Medicine

## 2018-08-16 ENCOUNTER — Other Ambulatory Visit: Payer: Self-pay | Admitting: Family Medicine

## 2018-09-10 ENCOUNTER — Other Ambulatory Visit: Payer: Self-pay | Admitting: Family Medicine

## 2018-09-10 NOTE — Telephone Encounter (Signed)
Please advise, when I clicked to fill med. It said invokana was preferred.

## 2018-09-12 NOTE — Telephone Encounter (Signed)
I will refill but pt is due for an appt to recheck DM

## 2018-09-15 ENCOUNTER — Other Ambulatory Visit: Payer: Self-pay

## 2018-09-15 ENCOUNTER — Encounter: Payer: Self-pay | Admitting: Family Medicine

## 2018-09-15 ENCOUNTER — Ambulatory Visit (INDEPENDENT_AMBULATORY_CARE_PROVIDER_SITE_OTHER): Payer: PPO | Admitting: Family Medicine

## 2018-09-15 VITALS — BP 122/72 | HR 82 | Temp 98.0°F | Resp 16 | Ht 65.0 in | Wt 192.2 lb

## 2018-09-15 DIAGNOSIS — R300 Dysuria: Secondary | ICD-10-CM

## 2018-09-15 LAB — POCT URINALYSIS DIPSTICK
BILIRUBIN UA: NEGATIVE
GLUCOSE UA: POSITIVE — AB
KETONES UA: NEGATIVE
Leukocytes, UA: NEGATIVE
Nitrite, UA: NEGATIVE
PH UA: 6 (ref 5.0–8.0)
Protein, UA: POSITIVE — AB
Spec Grav, UA: 1.01 (ref 1.010–1.025)
UROBILINOGEN UA: 0.2 U/dL

## 2018-09-15 MED ORDER — CEPHALEXIN 500 MG PO CAPS: 500.0000 mg | ORAL_CAPSULE | Freq: Two times a day (BID) | ORAL | 0 refills | Status: AC

## 2018-09-15 MED ORDER — PHENAZOPYRIDINE HCL 200 MG PO TABS: 200.0000 mg | ORAL_TABLET | Freq: Three times a day (TID) | ORAL | 0 refills | Status: DC | PRN

## 2018-09-15 NOTE — Patient Instructions (Signed)
Follow up as needed or as scheduled START the Cephalexin twice daily x5 days Use the Pyridium (AZO) up to 3x/day as needed for discomfort Drink LOTS of fluids Call with any questions or concerns Hang in there!!!

## 2018-09-15 NOTE — Progress Notes (Signed)
   Subjective:    Patient ID: Karen Gonzales, female    DOB: 1945-12-02, 73 y.o.   MRN: [id number]  HPI ? UTI- started w/ increased frequency and urgency yesterday.  Last night developed painful urination.  This AM saw blood w/ wiping.  Just restarted Jardiance and this caused her UTI when she first started.  No fevers.  Denies back pain.  + suprapubic pain.   Review of Systems For ROS see HPI     Objective:   Physical Exam Vitals signs reviewed.  Constitutional:      General: She is not in acute distress.    Appearance: Normal appearance. She is well-developed. She is obese.  Abdominal:     General: There is no distension.     Palpations: Abdomen is soft.     Tenderness: There is abdominal tenderness (+ suprapubic but no CVA tenderness ).  Neurological:     General: No focal deficit present.     Mental Status: She is alert and oriented to person, place, and time.  Psychiatric:        Mood and Affect: Mood normal.        Behavior: Behavior normal.        Thought Content: Thought content normal.           Assessment & Plan:  Dysuria- new.  Pt's sxs and UA are consistent w/ infxn.  Start abx.  Pyridium prn.  Reviewed supportive care and red flags that should prompt return.  Pt expressed understanding and is in agreement w/ plan.

## 2018-09-17 ENCOUNTER — Other Ambulatory Visit: Payer: Self-pay | Admitting: Family Medicine

## 2018-09-17 LAB — URINE CULTURE
MICRO NUMBER: 141823
SPECIMEN QUALITY:: ADEQUATE

## 2018-10-05 ENCOUNTER — Other Ambulatory Visit: Payer: Self-pay | Admitting: Family Medicine

## 2018-10-06 NOTE — Telephone Encounter (Signed)
Went to refill medication and warning came up advising that invokana was preferred medication. Please advise on how to proceed?

## 2018-10-28 ENCOUNTER — Ambulatory Visit (INDEPENDENT_AMBULATORY_CARE_PROVIDER_SITE_OTHER): Payer: PPO | Admitting: Family Medicine

## 2018-10-28 ENCOUNTER — Encounter: Payer: Self-pay | Admitting: Family Medicine

## 2018-10-28 ENCOUNTER — Other Ambulatory Visit: Payer: Self-pay

## 2018-10-28 VITALS — BP 123/83 | HR 70 | Temp 98.1°F | Resp 16 | Ht 65.0 in | Wt 190.4 lb

## 2018-10-28 DIAGNOSIS — E039 Hypothyroidism, unspecified: Secondary | ICD-10-CM | POA: Diagnosis not present

## 2018-10-28 DIAGNOSIS — J449 Chronic obstructive pulmonary disease, unspecified: Secondary | ICD-10-CM | POA: Diagnosis not present

## 2018-10-28 DIAGNOSIS — J45909 Unspecified asthma, uncomplicated: Secondary | ICD-10-CM | POA: Diagnosis not present

## 2018-10-28 DIAGNOSIS — E1121 Type 2 diabetes mellitus with diabetic nephropathy: Secondary | ICD-10-CM | POA: Diagnosis not present

## 2018-10-28 DIAGNOSIS — N401 Enlarged prostate with lower urinary tract symptoms: Secondary | ICD-10-CM | POA: Diagnosis not present

## 2018-10-28 DIAGNOSIS — G4733 Obstructive sleep apnea (adult) (pediatric): Secondary | ICD-10-CM | POA: Diagnosis not present

## 2018-10-28 DIAGNOSIS — E1140 Type 2 diabetes mellitus with diabetic neuropathy, unspecified: Secondary | ICD-10-CM

## 2018-10-28 DIAGNOSIS — G479 Sleep disorder, unspecified: Secondary | ICD-10-CM | POA: Diagnosis not present

## 2018-10-28 DIAGNOSIS — Z0000 Encounter for general adult medical examination without abnormal findings: Secondary | ICD-10-CM

## 2018-10-28 DIAGNOSIS — D649 Anemia, unspecified: Secondary | ICD-10-CM | POA: Diagnosis not present

## 2018-10-28 DIAGNOSIS — Z9181 History of falling: Secondary | ICD-10-CM | POA: Diagnosis not present

## 2018-10-28 DIAGNOSIS — E1169 Type 2 diabetes mellitus with other specified complication: Secondary | ICD-10-CM | POA: Diagnosis not present

## 2018-10-28 DIAGNOSIS — E7800 Pure hypercholesterolemia, unspecified: Secondary | ICD-10-CM | POA: Diagnosis not present

## 2018-10-28 DIAGNOSIS — N179 Acute kidney failure, unspecified: Secondary | ICD-10-CM | POA: Diagnosis not present

## 2018-10-28 DIAGNOSIS — R0902 Hypoxemia: Secondary | ICD-10-CM | POA: Diagnosis not present

## 2018-10-28 DIAGNOSIS — J342 Deviated nasal septum: Secondary | ICD-10-CM | POA: Diagnosis not present

## 2018-10-28 DIAGNOSIS — I480 Paroxysmal atrial fibrillation: Secondary | ICD-10-CM | POA: Diagnosis not present

## 2018-10-28 DIAGNOSIS — M1612 Unilateral primary osteoarthritis, left hip: Secondary | ICD-10-CM | POA: Diagnosis not present

## 2018-10-28 DIAGNOSIS — C719 Malignant neoplasm of brain, unspecified: Secondary | ICD-10-CM | POA: Diagnosis not present

## 2018-10-28 DIAGNOSIS — J441 Chronic obstructive pulmonary disease with (acute) exacerbation: Secondary | ICD-10-CM | POA: Diagnosis not present

## 2018-10-28 DIAGNOSIS — I252 Old myocardial infarction: Secondary | ICD-10-CM | POA: Diagnosis not present

## 2018-10-28 DIAGNOSIS — I255 Ischemic cardiomyopathy: Secondary | ICD-10-CM | POA: Diagnosis not present

## 2018-10-28 DIAGNOSIS — Z7189 Other specified counseling: Secondary | ICD-10-CM | POA: Diagnosis not present

## 2018-10-28 DIAGNOSIS — J9601 Acute respiratory failure with hypoxia: Secondary | ICD-10-CM | POA: Diagnosis not present

## 2018-10-28 DIAGNOSIS — I10 Essential (primary) hypertension: Secondary | ICD-10-CM | POA: Diagnosis not present

## 2018-10-28 DIAGNOSIS — I5022 Chronic systolic (congestive) heart failure: Secondary | ICD-10-CM | POA: Diagnosis not present

## 2018-10-28 DIAGNOSIS — E669 Obesity, unspecified: Secondary | ICD-10-CM | POA: Diagnosis not present

## 2018-10-28 DIAGNOSIS — C3410 Malignant neoplasm of upper lobe, unspecified bronchus or lung: Secondary | ICD-10-CM | POA: Diagnosis not present

## 2018-10-28 DIAGNOSIS — J479 Bronchiectasis, uncomplicated: Secondary | ICD-10-CM | POA: Diagnosis not present

## 2018-10-28 DIAGNOSIS — I2510 Atherosclerotic heart disease of native coronary artery without angina pectoris: Secondary | ICD-10-CM | POA: Diagnosis not present

## 2018-10-28 LAB — BASIC METABOLIC PANEL
BUN: 17 mg/dL (ref 6–23)
CALCIUM: 9.9 mg/dL (ref 8.4–10.5)
CO2: 28 mEq/L (ref 19–32)
Chloride: 101 mEq/L (ref 96–112)
Creatinine, Ser: 0.74 mg/dL (ref 0.40–1.20)
GFR: 77.28 mL/min (ref >60.00)
Glucose, Bld: 152 mg/dL — ABNORMAL HIGH (ref 70–99)
Potassium: 4.3 mEq/L (ref 3.5–5.1)
Sodium: 138 mEq/L (ref 135–145)

## 2018-10-28 LAB — CBC WITH DIFFERENTIAL/PLATELET
Basophils Absolute: 0.1 10*3/uL (ref 0.0–0.1)
Basophils Relative: 1.8 % (ref 0.0–3.0)
Eosinophils Absolute: 0.2 10*3/uL (ref 0.0–0.7)
Eosinophils Relative: 3.3 % (ref 0.0–5.0)
HCT: 39 % (ref 36.0–46.0)
Hemoglobin: 12.9 g/dL (ref 12.0–15.0)
LYMPHS PCT: 31.8 % (ref 12.0–46.0)
Lymphs Abs: 2.4 10*3/uL (ref 0.7–4.0)
MCHC: 33 g/dL (ref 30.0–36.0)
MCV: 85.2 fl (ref 78.0–100.0)
Monocytes Absolute: 0.6 10*3/uL (ref 0.1–1.0)
Monocytes Relative: 7.9 % (ref 3.0–12.0)
Neutro Abs: 4.2 10*3/uL (ref 1.4–7.7)
Neutrophils Relative %: 55.2 % (ref 43.0–77.0)
Platelets: 218 10*3/uL (ref 150.0–400.0)
RBC: 4.58 Mil/uL (ref 3.87–5.11)
RDW: 13.2 % (ref 11.5–15.5)
WBC: 7.5 10*3/uL (ref 4.0–10.5)

## 2018-10-28 LAB — HEPATIC FUNCTION PANEL
ALT: 44 U/L — ABNORMAL HIGH (ref 0–35)
AST: 35 U/L (ref 0–37)
Albumin: 4.5 g/dL (ref 3.5–5.2)
Alkaline Phosphatase: 59 U/L (ref 39–117)
Bilirubin, Direct: 0.1 mg/dL (ref 0.0–0.3)
Total Bilirubin: 0.5 mg/dL (ref 0.2–1.2)
Total Protein: 7.7 g/dL (ref 6.0–8.3)

## 2018-10-28 LAB — HEMOGLOBIN A1C: Hgb A1c MFr Bld: 8.2 % — ABNORMAL HIGH (ref 4.6–6.5)

## 2018-10-28 LAB — TSH: TSH: 2.12 u[IU]/mL (ref 0.35–4.50)

## 2018-10-28 LAB — LIPID PANEL
Cholesterol: 152 mg/dL (ref 0–200)
HDL: 46.2 mg/dL (ref >39.00)
LDL Cholesterol: 66 mg/dL (ref 0–99)
NonHDL: 105.73
TRIGLYCERIDES: 198 mg/dL — AB (ref 0.0–149.0)
Total CHOL/HDL Ratio: 3
VLDL: 39.6 mg/dL (ref 0.0–40.0)

## 2018-10-28 NOTE — Patient Instructions (Addendum)
Follow up in 3-4 months to recheck diabetes We'll notify you of your lab results and make any changes if needed Continue to work on healthy diet and regular exercise- you can do it! Schedule your mammogram when you get the reminder Call with any questions or concerns Stay Safe!!!

## 2018-10-28 NOTE — Assessment & Plan Note (Signed)
Chronic problem.  UTD on foot exam, eye exam, on ARB for renal protection.  Stressed need for healthy diet and regular exercise.  Check labs.  Adjust meds prn

## 2018-10-28 NOTE — Progress Notes (Signed)
   Subjective:    Patient ID: Karen Gonzales, female    DOB: [birth year], 73 y.o.   MRN: [id number]  HPI CPE- UTD on mammo, colonoscopy, eye exam, foot exam, DEXA.   Review of Systems Patient reports no vision/ hearing changes, adenopathy,fever, weight change,  persistant/recurrent hoarseness , swallowing issues, chest pain, palpitations, edema, persistant/recurrent cough, hemoptysis, dyspnea (rest/exertional/paroxysmal nocturnal), gastrointestinal bleeding (melena, rectal bleeding), abdominal pain, significant heartburn, bowel changes, GU symptoms (dysuria, hematuria, incontinence), Gyn symptoms (abnormal  bleeding, pain),  syncope, focal weakness, memory loss, numbness & tingling, skin/hair/nail changes, abnormal bruising or bleeding, anxiety, or depression.     Objective:   Physical Exam General Appearance:    Alert, cooperative, no distress, appears stated age, obese  Head:    Normocephalic, without obvious abnormality, atraumatic  Eyes:    PERRL, conjunctiva/corneas clear, EOM's intact, fundi    benign, both eyes  Ears:    Normal TM's and external ear canals, both ears  Nose:   Nares normal, septum midline, mucosa normal, no drainage    or sinus tenderness  Throat:   Lips, mucosa, and tongue normal; teeth and gums normal  Neck:   Supple, symmetrical, trachea midline, no adenopathy;    Thyroid: no enlargement/tenderness/nodules  Back:     Symmetric, no curvature, ROM normal, no CVA tenderness  Lungs:     Clear to auscultation bilaterally, respirations unlabored  Chest Wall:    No tenderness or deformity   Heart:    Regular rate and rhythm, S1 and S2 normal, II/VI SEM murmur, no rub or gallop  Breast Exam:    Deferred to mammo  Abdomen:     Soft, non-tender, bowel sounds active all four quadrants,    no masses, no organomegaly  Genitalia:    Deferred  Rectal:    Extremities:   Extremities normal, atraumatic, no cyanosis or edema  Pulses:   2+ and symmetric all extremities  Skin:    Skin color, texture, turgor normal, no rashes or lesions  Lymph nodes:   Cervical, supraclavicular, and axillary nodes normal  Neurologic:   CNII-XII intact, normal strength, sensation and reflexes    throughout          Assessment & Plan:

## 2018-10-28 NOTE — Assessment & Plan Note (Signed)
Pt's PE unchanged from previous.  UTD on m

## 2018-10-29 ENCOUNTER — Encounter: Payer: Self-pay | Admitting: General Practice

## 2018-11-05 ENCOUNTER — Ambulatory Visit: Payer: PPO

## 2018-11-20 ENCOUNTER — Other Ambulatory Visit: Payer: Self-pay | Admitting: Family Medicine

## 2019-01-07 ENCOUNTER — Other Ambulatory Visit: Payer: Self-pay | Admitting: Family Medicine

## 2019-01-08 ENCOUNTER — Ambulatory Visit: Payer: PPO

## 2019-01-23 ENCOUNTER — Other Ambulatory Visit: Payer: Self-pay | Admitting: Family Medicine

## 2019-01-26 ENCOUNTER — Other Ambulatory Visit: Payer: Self-pay | Admitting: Family Medicine

## 2019-01-26 DIAGNOSIS — Z1231 Encounter for screening mammogram for malignant neoplasm of breast: Secondary | ICD-10-CM

## 2019-01-28 ENCOUNTER — Ambulatory Visit
Admission: RE | Admit: 2019-01-28 | Discharge: 2019-01-28 | Disposition: A | Discharge status: Home / Self Care | Payer: PPO | Source: Ambulatory Visit | Attending: Family Medicine | Admitting: Family Medicine

## 2019-01-28 ENCOUNTER — Other Ambulatory Visit: Payer: Self-pay

## 2019-01-28 DIAGNOSIS — Z1231 Encounter for screening mammogram for malignant neoplasm of breast: Secondary | ICD-10-CM | POA: Diagnosis not present

## 2019-02-26 ENCOUNTER — Other Ambulatory Visit: Payer: Self-pay | Admitting: Family Medicine

## 2019-03-20 ENCOUNTER — Other Ambulatory Visit: Payer: Self-pay | Admitting: Family Medicine

## 2019-04-06 ENCOUNTER — Other Ambulatory Visit: Payer: Self-pay | Admitting: Family Medicine

## 2019-04-18 ENCOUNTER — Other Ambulatory Visit: Payer: Self-pay | Admitting: Family Medicine

## 2019-04-28 DIAGNOSIS — M1711 Unilateral primary osteoarthritis, right knee: Secondary | ICD-10-CM | POA: Diagnosis not present

## 2019-05-01 ENCOUNTER — Other Ambulatory Visit: Payer: Self-pay | Admitting: Family Medicine

## 2019-05-18 ENCOUNTER — Other Ambulatory Visit: Payer: Self-pay | Admitting: Family Medicine

## 2019-05-18 NOTE — Telephone Encounter (Signed)
139m- they were on backorder so she was taking 2 of the 553mdaily

## 2019-05-18 NOTE — Telephone Encounter (Signed)
Please advise should pt be on 13m or 523m

## 2019-05-19 DIAGNOSIS — M1711 Unilateral primary osteoarthritis, right knee: Secondary | ICD-10-CM | POA: Diagnosis not present

## 2019-05-24 ENCOUNTER — Other Ambulatory Visit: Payer: Self-pay | Admitting: Family Medicine

## 2019-05-26 ENCOUNTER — Other Ambulatory Visit: Payer: Self-pay | Admitting: Family Medicine

## 2019-05-28 ENCOUNTER — Other Ambulatory Visit: Payer: Self-pay

## 2019-05-28 ENCOUNTER — Ambulatory Visit (INDEPENDENT_AMBULATORY_CARE_PROVIDER_SITE_OTHER): Payer: PPO | Admitting: Family Medicine

## 2019-05-28 ENCOUNTER — Encounter: Payer: Self-pay | Admitting: Family Medicine

## 2019-05-28 VITALS — BP 121/79 | HR 76 | Temp 97.9°F | Resp 16 | Ht 65.0 in | Wt 191.1 lb

## 2019-05-28 DIAGNOSIS — I10 Essential (primary) hypertension: Secondary | ICD-10-CM

## 2019-05-28 DIAGNOSIS — E1140 Type 2 diabetes mellitus with diabetic neuropathy, unspecified: Secondary | ICD-10-CM | POA: Diagnosis not present

## 2019-05-28 DIAGNOSIS — E785 Hyperlipidemia, unspecified: Secondary | ICD-10-CM

## 2019-05-28 DIAGNOSIS — Z23 Encounter for immunization: Secondary | ICD-10-CM

## 2019-05-28 DIAGNOSIS — Z0000 Encounter for general adult medical examination without abnormal findings: Secondary | ICD-10-CM | POA: Diagnosis not present

## 2019-05-28 LAB — HEPATIC FUNCTION PANEL
ALT: 42 U/L — ABNORMAL HIGH (ref 0–35)
AST: 33 U/L (ref 0–37)
Albumin: 4.4 g/dL (ref 3.5–5.2)
Alkaline Phosphatase: 57 U/L (ref 39–117)
Bilirubin, Direct: 0.1 mg/dL (ref 0.0–0.3)
Total Bilirubin: 0.5 mg/dL (ref 0.2–1.2)
Total Protein: 7.4 g/dL (ref 6.0–8.3)

## 2019-05-28 LAB — CBC WITH DIFFERENTIAL/PLATELET
Basophils Absolute: 0.4 10*3/uL — ABNORMAL HIGH (ref 0.0–0.1)
Basophils Relative: 4.7 % — ABNORMAL HIGH (ref 0.0–3.0)
Eosinophils Absolute: 0.3 10*3/uL (ref 0.0–0.7)
Eosinophils Relative: 3.2 % (ref 0.0–5.0)
HCT: 38.4 % (ref 36.0–46.0)
Hemoglobin: 12.6 g/dL (ref 12.0–15.0)
Lymphocytes Relative: 29.5 % (ref 12.0–46.0)
Lymphs Abs: 2.3 10*3/uL (ref 0.7–4.0)
MCHC: 32.8 g/dL (ref 30.0–36.0)
MCV: 85.4 fl (ref 78.0–100.0)
Monocytes Absolute: 0.8 10*3/uL (ref 0.1–1.0)
Monocytes Relative: 9.9 % (ref 3.0–12.0)
Neutro Abs: 4.2 10*3/uL (ref 1.4–7.7)
Neutrophils Relative %: 52.7 % (ref 43.0–77.0)
Platelets: 211 10*3/uL (ref 150.0–400.0)
RBC: 4.5 Mil/uL (ref 3.87–5.11)
RDW: 13.6 % (ref 11.5–15.5)
WBC: 8 10*3/uL (ref 4.0–10.5)

## 2019-05-28 LAB — BASIC METABOLIC PANEL
BUN: 13 mg/dL (ref 6–23)
CO2: 26 mEq/L (ref 19–32)
Calcium: 9.2 mg/dL (ref 8.4–10.5)
Chloride: 101 mEq/L (ref 96–112)
Creatinine, Ser: 0.74 mg/dL (ref 0.40–1.20)
GFR: 77.15 mL/min (ref >60.00)
Glucose, Bld: 146 mg/dL — ABNORMAL HIGH (ref 70–99)
Potassium: 4.4 mEq/L (ref 3.5–5.1)
Sodium: 135 mEq/L (ref 135–145)

## 2019-05-28 LAB — LIPID PANEL
Cholesterol: 149 mg/dL (ref 0–200)
HDL: 49.6 mg/dL (ref >39.00)
LDL Cholesterol: 70 mg/dL (ref 0–99)
NonHDL: 99.62
Total CHOL/HDL Ratio: 3
Triglycerides: 147 mg/dL (ref 0.0–149.0)
VLDL: 29.4 mg/dL (ref 0.0–40.0)

## 2019-05-28 LAB — TSH: TSH: 1.09 u[IU]/mL (ref 0.35–4.50)

## 2019-05-28 LAB — HEMOGLOBIN A1C: Hgb A1c MFr Bld: 8.3 % — ABNORMAL HIGH (ref 4.6–6.5)

## 2019-05-28 NOTE — Assessment & Plan Note (Signed)
Chronic problem.  Well controlled today.  Asymptomatic at this time.  Check labs.  No anticipated med changes.  Will follow.

## 2019-05-28 NOTE — Assessment & Plan Note (Signed)
Chronic problem.  On Glipizide 66m and Metformin 1000 mg BID.  Not currently able to afford Jardiance due to Medicare donut hole.  Due for eye exam- pt to schedule.  Foot exam done today.  On ARB for renal protection.  Stressed need for healthy diet and regular exercise.  Will follow.

## 2019-05-28 NOTE — Assessment & Plan Note (Signed)
Chronic problem.  Tolerating statin w/o difficulty.  Stressed need for healthy diet and regular exercise.  Check labs.  Adjust meds prn  

## 2019-05-28 NOTE — Patient Instructions (Addendum)
Follow up in 3-4 months to recheck diabetes We'll notify you of your lab results and make any changes if needed Continue to work on healthy diet and regular exercise- you can do it! Schedule your eye exam at your convenience and have them send me the report Call with any questions or concerns Stay Safe! Happy Early [place]!!!

## 2019-05-28 NOTE — Progress Notes (Signed)
   Subjective:    Patient ID: Karen Gonzales, female    DOB: May 19, 1946, 73 y.o.   MRN: [id number]  HPI Here today for AWV.  Risk Factors: HTN- chronic problem, on Amlodipine 58m, Losartan 1023mdaily w/ good control.  Denies CP, SOB, HAs, visual changes, edema. Hyperlipidemia- chronic problem, on Simvastatin 1088mightly.  No abd pain, N/V. DM- chronic problem, on Glipizide 5mg65mily, Metformin 1000mg92m, Jardiance 10mg 65my.  Not taking Jardiance due to donut.  Last A1C 8.2.  Due for eye exam, foot exam.  On Losartan 100mg d71m.  Denies symptomatic lows.  Denies numbness/tingling of hands feet. Physical Activity: no regular exercise Fall Risk: low Depression: denies current sxs Hearing: normal to conversational tones, mildly decreased to whispered voice ADL's: independent, lives w/ husband and cares for him Cognitive: normal linear thought process, memory and attention intact Home Safety: safe at home Height, Weight, BMI, Visual Acuity: see vitals, vision corrected to 20/20 w/ glasses Counseling: UTD on mammo, DEXA, colonoscopy, pneumonia vaccines.  Due for flu Labs Ordered: See A&P Care Plan: See A&P   Patient Care Team    Relationship Specialty Notifications Start End  Fontaine Hehl,Midge MiniumP - General Family Medicine  11/07/15   Pool, HEarnie Larssonnsulting Physician Neurosurgery  07/02/11   EdwardsLaurence Spatesnsulting Physician Gastroenterology  11/07/15   Regal, Wallene Huhonsulting Physician Podiatry  11/07/15      Review of Systems For ROS see HPI     Objective:   Physical Exam Vitals signs reviewed.  Constitutional:      General: She is not in acute distress.    Appearance: She is well-developed.  HENT:     Head: Normocephalic and atraumatic.  Eyes:     Conjunctiva/sclera: Conjunctivae normal.     Pupils: Pupils are equal, round, and reactive to light.  Neck:     Musculoskeletal: Normal range of motion and neck supple.     Thyroid: No thyromegaly.   Cardiovascular:     Rate and Rhythm: Normal rate and regular rhythm.     Heart sounds: Murmur (II/VI SEM) present.  Pulmonary:     Effort: Pulmonary effort is normal. No respiratory distress.     Breath sounds: Normal breath sounds.  Abdominal:     General: There is no distension.     Palpations: Abdomen is soft.     Tenderness: There is no abdominal tenderness.  Lymphadenopathy:     Cervical: No cervical adenopathy.  Skin:    General: Skin is warm and dry.  Neurological:     Mental Status: She is alert and oriented to person, place, and time.  Psychiatric:        Behavior: Behavior normal.           Assessment & Plan:

## 2019-05-29 ENCOUNTER — Other Ambulatory Visit: Payer: Self-pay | Admitting: General Practice

## 2019-05-29 MED ORDER — GLIPIZIDE ER 5 MG PO TB24: 10.0000 mg | ORAL_TABLET | Freq: Every day | ORAL | 0 refills | Status: DC

## 2019-06-17 ENCOUNTER — Telehealth: Payer: Self-pay | Admitting: Family Medicine

## 2019-06-17 MED ORDER — GLIPIZIDE ER 10 MG PO TB24: 10.0000 mg | ORAL_TABLET | Freq: Every day | ORAL | 1 refills | Status: DC

## 2019-06-17 NOTE — Telephone Encounter (Signed)
Ok to switch to 39m daily

## 2019-06-17 NOTE — Telephone Encounter (Signed)
Pt called in stating that the Glipizide 86m was to be changed to [facility] [place] Please see lab results from 05/28/2019.

## 2019-06-17 NOTE — Telephone Encounter (Signed)
Please advise. I had sent in the 92m BID, because she had asked me to.

## 2019-06-17 NOTE — Telephone Encounter (Signed)
Medication filled to pharmacy as requested.   

## 2019-06-18 ENCOUNTER — Other Ambulatory Visit: Payer: Self-pay | Admitting: Family Medicine

## 2019-06-24 ENCOUNTER — Other Ambulatory Visit: Payer: Self-pay | Admitting: Podiatry

## 2019-06-24 ENCOUNTER — Ambulatory Visit: Payer: PPO | Admitting: Podiatry

## 2019-06-24 ENCOUNTER — Other Ambulatory Visit: Payer: Self-pay

## 2019-06-24 ENCOUNTER — Encounter: Payer: Self-pay | Admitting: Podiatry

## 2019-06-24 ENCOUNTER — Ambulatory Visit (INDEPENDENT_AMBULATORY_CARE_PROVIDER_SITE_OTHER): Payer: PPO

## 2019-06-24 DIAGNOSIS — M7670 Peroneal tendinitis, unspecified leg: Secondary | ICD-10-CM

## 2019-06-24 DIAGNOSIS — M7671 Peroneal tendinitis, right leg: Secondary | ICD-10-CM

## 2019-06-24 DIAGNOSIS — M25572 Pain in left ankle and joints of left foot: Secondary | ICD-10-CM

## 2019-06-24 DIAGNOSIS — M7672 Peroneal tendinitis, left leg: Secondary | ICD-10-CM

## 2019-06-24 NOTE — Progress Notes (Signed)
Subjective:   Patient ID: Karen Gonzales, female   DOB: 73 y.o.   MRN: [id number]   HPI Patient presents with pain in the left ankle with mild in the right.  States is been going on and gotten worse over the last few months   ROS      Objective:  Physical Exam  Neurovascular status intact with inflammation of the lateral ankle left and along the peroneal complex with mild discomfort right     Assessment:  Peroneal tendinitis left with mild pain right     Plan:  H&P reviewed condition and I did a sterile prep and injected the tendon complex lateral ankle gutter 3 mg Kenalog 5 mg Xylocaine and may do on the right depending on response and reappoint if symptoms indicate  X-rays indicate that there is mild arthritic issues but no indications of advanced pathology

## 2019-07-13 ENCOUNTER — Other Ambulatory Visit: Payer: Self-pay

## 2019-07-13 DIAGNOSIS — Z20822 Contact with and (suspected) exposure to covid-19: Secondary | ICD-10-CM

## 2019-07-14 ENCOUNTER — Telehealth: Payer: Self-pay

## 2019-07-14 LAB — NOVEL CORONAVIRUS, NAA: SARS-CoV-2, NAA: DETECTED — AB

## 2019-07-14 NOTE — Telephone Encounter (Signed)
Patient clled for her COVID-19 test result.  She was told that her result was still pending. Her husband is positive ans she was instructed to wear a mask and create a separate room for her husband until her test is processed.  She verbalized understanding of all information.

## 2019-07-15 ENCOUNTER — Telehealth: Payer: Self-pay | Admitting: Unknown Physician Specialty

## 2019-07-15 NOTE — Telephone Encounter (Signed)
Discussed with patient about Covid symptoms and the use of bamlanivimab, a monoclonal antibody infusion for those with mild to moderate Covid symptoms and at a high risk of hospitalization.    Pt is qualified for this infusion at the [facility] infusion center due to co-morbid conditions and/or a  member of an at-risk group.    Pt declines due to very mild symptoms

## 2019-07-16 ENCOUNTER — Telehealth: Payer: Self-pay

## 2019-07-16 MED ORDER — GUAIFENESIN-CODEINE 100-10 MG/5ML PO SYRP: 10.0000 mL | ORAL_SOLUTION | Freq: Three times a day (TID) | ORAL | 0 refills | Status: DC | PRN

## 2019-07-16 NOTE — Telephone Encounter (Signed)
Patient's husband calling back to know if any medications were going to be sent in. Please advise

## 2019-07-16 NOTE — Telephone Encounter (Signed)
Called and advised Rx had been sent to pharmacy.

## 2019-07-16 NOTE — Telephone Encounter (Signed)
Prescription for Cheratussin sent

## 2019-07-16 NOTE — Telephone Encounter (Signed)
Called and spoke with pt. She denies any SOB. Was able to converse normal. Pt did have asthmatic sounding cough. Stated that it tickles in her throat and she cannot make it stop. Pt was advised I was routing this message back to PCP and also advised to increase her fluids and symptoms to be mindful of that should prompt a call to our office for evaluation or to proceed to ER. Stated an understanding.

## 2019-07-16 NOTE — Telephone Encounter (Signed)
If her sxs are severe enough that she is vomiting, we need to make sure she is ok rather than just sending in a prescription.  I worry about dehydration, shortness of breath, etc.  Please call pt and check on her

## 2019-07-16 NOTE — Addendum Note (Signed)
Addended by: [name] on: 07/16/2019 04:05 PM   Modules accepted: Orders

## 2019-07-16 NOTE — Telephone Encounter (Signed)
Patient called in requesting that PCP call in something for her cough. Both she and husband are COVID+. Nothing OTC is helping with her symptoms. Coughing so much, is causing her to vomit through out the day. Informed patient that I would route message to PCP. Please advise

## 2019-07-23 ENCOUNTER — Other Ambulatory Visit: Payer: Self-pay | Admitting: Family Medicine

## 2019-08-03 ENCOUNTER — Other Ambulatory Visit: Payer: Self-pay | Admitting: Family Medicine

## 2019-08-24 DIAGNOSIS — Z1159 Encounter for screening for other viral diseases: Secondary | ICD-10-CM | POA: Diagnosis not present

## 2019-08-24 LAB — HM COLONOSCOPY

## 2019-08-27 DIAGNOSIS — Z86010 Personal history of colonic polyps: Secondary | ICD-10-CM | POA: Diagnosis not present

## 2019-08-27 DIAGNOSIS — D120 Benign neoplasm of cecum: Secondary | ICD-10-CM | POA: Diagnosis not present

## 2019-08-28 ENCOUNTER — Encounter: Payer: Self-pay | Admitting: Family Medicine

## 2019-08-31 ENCOUNTER — Encounter: Payer: Self-pay | Admitting: Family Medicine

## 2019-08-31 ENCOUNTER — Ambulatory Visit (INDEPENDENT_AMBULATORY_CARE_PROVIDER_SITE_OTHER): Payer: PPO | Admitting: Family Medicine

## 2019-08-31 ENCOUNTER — Other Ambulatory Visit: Payer: Self-pay

## 2019-08-31 VITALS — BP 124/84 | HR 81 | Temp 98.1°F | Resp 16 | Ht 65.0 in | Wt 194.5 lb

## 2019-08-31 DIAGNOSIS — E1140 Type 2 diabetes mellitus with diabetic neuropathy, unspecified: Secondary | ICD-10-CM

## 2019-08-31 DIAGNOSIS — E669 Obesity, unspecified: Secondary | ICD-10-CM | POA: Insufficient documentation

## 2019-08-31 DIAGNOSIS — I83812 Varicose veins of left lower extremities with pain: Secondary | ICD-10-CM | POA: Insufficient documentation

## 2019-08-31 LAB — BASIC METABOLIC PANEL
BUN: 17 mg/dL (ref 6–23)
CO2: 25 mEq/L (ref 19–32)
Calcium: 9.5 mg/dL (ref 8.4–10.5)
Chloride: 102 mEq/L (ref 96–112)
Creatinine, Ser: 0.67 mg/dL (ref 0.40–1.20)
GFR: 86.46 mL/min (ref >60.00)
Glucose, Bld: 138 mg/dL — ABNORMAL HIGH (ref 70–99)
Potassium: 3.9 mEq/L (ref 3.5–5.1)
Sodium: 137 mEq/L (ref 135–145)

## 2019-08-31 LAB — HEMOGLOBIN A1C: Hgb A1c MFr Bld: 8.3 % — ABNORMAL HIGH (ref 4.6–6.5)

## 2019-08-31 NOTE — Progress Notes (Signed)
   Subjective:    Patient ID: Karen Gonzales, female    DOB: 1945/11/12, 74 y.o.   MRN: [id number]  HPI DM- chronic problem, on Glipizide 21m daily, Jardiance 1107mdaily (restarted 2 weeks), Metformin 100031mID w/ last A1C 8.3.  On ARB for renal protection.  UTD on foot exam.  Due for eye exam.  Denies symptomatic lows.  No CP, SOB, HAs, visual changes, abd pain, N/V.  Varicose veins- L leg, reports area will burn.  Will have L leg swelling.  Obesity- weight is stable at 194.  BMI is 32.37.  No regular exercise.   Review of Systems For ROS see HPI   This visit occurred during the SARS-CoV-2 public health emergency.  Safety protocols were in place, including screening questions prior to the visit, additional usage of staff PPE, and extensive cleaning of exam room while observing appropriate contact time as indicated for disinfecting solutions.       Objective:   Physical Exam Vitals reviewed.  Constitutional:      General: She is not in acute distress.    Appearance: She is well-developed.  HENT:     Head: Normocephalic and atraumatic.  Eyes:     Conjunctiva/sclera: Conjunctivae normal.     Pupils: Pupils are equal, round, and reactive to light.  Neck:     Thyroid: No thyromegaly.  Cardiovascular:     Rate and Rhythm: Normal rate and regular rhythm.     Pulses:          Dorsalis pedis pulses are 1+ on the right side and 1+ on the left side.       Posterior tibial pulses are 1+ on the right side and 1+ on the left side.     Heart sounds: Murmur present. Systolic murmur present with a grade of 2/6.     Comments: Varicose veins L lower leg Pulmonary:     Effort: Pulmonary effort is normal. No respiratory distress.     Breath sounds: Normal breath sounds.  Abdominal:     General: There is no distension.     Palpations: Abdomen is soft.     Tenderness: There is no abdominal tenderness.  Musculoskeletal:     Cervical back: Normal range of motion and neck supple.     Right  lower leg: No edema.     Left lower leg: No edema.  Lymphadenopathy:     Cervical: No cervical adenopathy.  Skin:    General: Skin is warm and dry.  Neurological:     Mental Status: She is alert and oriented to person, place, and time.  Psychiatric:        Behavior: Behavior normal.           Assessment & Plan:

## 2019-08-31 NOTE — Patient Instructions (Addendum)
Schedule your complete physical in 3-4 months We'll notify you of your lab results and make any changes if needed We'll call you with your vascular appt Continue to work on healthy diet and regular exercise- you can do it! Call and schedule your eye exam. Call with any questions or concerns Stay Safe!  Stay Healthy!

## 2019-08-31 NOTE — Assessment & Plan Note (Addendum)
New.  Pt reports L lower leg is symptomatic w/ burning and some pain.  Discussed importance of weight loss.  Will refer to Vascular for complete evaluation.  Pt expressed understanding and is in agreement w/ plan.

## 2019-08-31 NOTE — Assessment & Plan Note (Signed)
Ongoing issue.  Discussed need for low carb diet due to DM and regular exercise.  Will continue to follow.

## 2019-08-31 NOTE — Assessment & Plan Note (Signed)
Chronic problem.  Since it is a new year, pt is again able to avoid Jardiance b/c insurance reset.  Will schedule eye exam.  Neuropathy unchanged from previous.  Check labs.  Adjust meds prn

## 2019-09-01 DIAGNOSIS — D120 Benign neoplasm of cecum: Secondary | ICD-10-CM | POA: Diagnosis not present

## 2019-09-06 ENCOUNTER — Other Ambulatory Visit: Payer: Self-pay | Admitting: Family Medicine

## 2019-09-29 ENCOUNTER — Other Ambulatory Visit: Payer: Self-pay | Admitting: Family Medicine

## 2019-09-29 DIAGNOSIS — H524 Presbyopia: Secondary | ICD-10-CM | POA: Diagnosis not present

## 2019-09-29 DIAGNOSIS — E119 Type 2 diabetes mellitus without complications: Secondary | ICD-10-CM | POA: Diagnosis not present

## 2019-09-29 DIAGNOSIS — H5203 Hypermetropia, bilateral: Secondary | ICD-10-CM | POA: Diagnosis not present

## 2019-09-29 DIAGNOSIS — H52222 Regular astigmatism, left eye: Secondary | ICD-10-CM | POA: Diagnosis not present

## 2019-10-08 ENCOUNTER — Telehealth (HOSPITAL_COMMUNITY): Payer: Self-pay

## 2019-10-08 NOTE — Telephone Encounter (Signed)

## 2019-10-09 ENCOUNTER — Ambulatory Visit (HOSPITAL_COMMUNITY)
Admission: RE | Admit: 2019-10-09 | Discharge: 2019-10-09 | Disposition: A | Discharge status: Home / Self Care | Payer: PPO | Source: Ambulatory Visit | Attending: Surgery | Admitting: Surgery

## 2019-10-09 ENCOUNTER — Other Ambulatory Visit: Payer: Self-pay

## 2019-10-09 ENCOUNTER — Ambulatory Visit: Payer: PPO | Admitting: Physician Assistant

## 2019-10-09 VITALS — BP 151/75 | HR 86 | Temp 97.5°F | Resp 14 | Ht 65.0 in | Wt 195.0 lb

## 2019-10-09 DIAGNOSIS — I83812 Varicose veins of left lower extremities with pain: Secondary | ICD-10-CM

## 2019-10-09 DIAGNOSIS — I872 Venous insufficiency (chronic) (peripheral): Secondary | ICD-10-CM

## 2019-10-09 NOTE — Progress Notes (Signed)
Requested by:  [name], MD [address] [place],  [place] [address]   History of Present Illness   Karen Gonzales is a 74 y.o. (11/11/1945) female who presents for evaluation of possible venous insufficiency.  She complains of burning pain along the inside of her left leg from her groin to her calf.  She also occasionally has some sharp shooting pains accompanied with redness however these episodes are more unpredictable.  She has noticed more prominent varicose veins along the inside of her leg as well as spider veins especially over the past couple months.  Symptoms are made worse with walking and standing and are relieved with elevation.  She also complains of occasional cramping in her left calf especially at night.  She has tried compression in the past however does not routinely use compression.  She has not had any history of DVT, venous ulceration, or vein procedures.  She denies tobacco use.  Past Medical History:  Diagnosis Date  . Anti-cardiolipin antibody syndrome ([place]) 06/25/2011  . Arthritis   . Blood transfusion    had when knee was replaced  . Bronchitis    history of  . Chills   . Complication of anesthesia   . Cough   . Diabetes mellitus   . DVT (deep venous thrombosis) ([place]) 06/25/2011  . Fatty liver disease, nonalcoholic    determinde by Ultrasound 11/11  . GERD (gastroesophageal reflux disease)   . H/O vitamin D deficiency   . Heart murmur   . Heart murmur    Benign  . Hiatal hernia   . Hyperlipidemia   . Hypertension   . Hyponatremia    with diuretic use  . Osteopenia   . PONV (postoperative nausea and vomiting)   . Vertigo     Past Surgical History:  Procedure Laterality Date  . ABDOMINAL HYSTERECTOMY    . ACHILLES TENDON SURGERY     bilateral   . CARPAL TUNNEL RELEASE     bilateral   . CHOLECYSTECTOMY  1969   open  . CRANIOTOMY  07/09/2011   Procedure: CRANIOTOMY HEMATOMA EVACUATION SUBDURAL;  Surgeon: Cooper Render Pool;   Location: [facility];  Service: Neurosurgery;  Laterality: Right;  Excision of Skull Lesion-Right Right Parietal Crani w/ Resection of Skull Tumor (1 1/2 hours - to follow)  . DILATION AND CURETTAGE OF UTERUS    . JOINT REPLACEMENT  2004   left knee replacement   . KNEE SURGERY     total of 6 surgeries on both knees   . TUBAL LIGATION    . TUMOR REMOVAL     on pelvic bone     Social History   Socioeconomic History  . Marital status: Married    Spouse name: Not on file  . Number of children: Not on file  . Years of education: Not on file  . Highest education level: Not on file  Occupational History  . Not on file  Tobacco Use  . Smoking status: Never Smoker  . Smokeless tobacco: Never Used  Substance and Sexual Activity  . Alcohol use: No  . Drug use: No  . Sexual activity: Not on file  Other Topics Concern  . Not on file  Social History Narrative  . Not on file   Social Determinants of Health   Financial Resource Strain:   . Difficulty of Paying Living Expenses: Not on file  Food Insecurity:   . Worried About Estate manager/land agent  of Food in the Last Year: Not on file  . Ran Out of Food in the Last Year: Not on file  Transportation Needs:   . Lack of Transportation (Medical): Not on file  . Lack of Transportation (Non-Medical): Not on file  Physical Activity:   . Days of Exercise per Week: Not on file  . Minutes of Exercise per Session: Not on file  Stress:   . Feeling of Stress : Not on file  Social Connections:   . Frequency of Communication with Friends and Family: Not on file  . Frequency of Social Gatherings with Friends and Family: Not on file  . Attends Religious Services: Not on file  . Active Member of Clubs or Organizations: Not on file  . Attends Archivist Meetings: Not on file  . Marital Status: Not on file  Intimate Partner Violence:   . Fear of Current or Ex-Partner: Not on file  . Emotionally Abused: Not on file  . Physically Abused: Not on  file  . Sexually Abused: Not on file    Family History  Problem Relation Age of Onset  . Kidney disease Mother   . Osteoporosis Mother   . Heart disease Father   . Cancer Father        lymphoma  . Osteoporosis Father   . Healthy Brother   . Cancer Maternal Grandmother        lymphoma  . Breast cancer Maternal Grandmother   . Cancer Paternal Grandmother        breast  . Breast cancer Paternal Aunt   . Hodgkin's lymphoma Grandchild     Current Outpatient Medications  Medication Sig Dispense Refill  . amLODipine (NORVASC) 5 MG tablet Take 1 tablet (5 mg total) by mouth daily. 90 tablet 1  . Blood Glucose Monitoring Suppl (ONE TOUCH ULTRA 2) w/Device KIT Pt uses glucometer to check sugars twice daily. Dx E11.9 1 each 0  . cetirizine (ZYRTEC) 10 MG tablet TAKE 1 TABLET BY MOUTH EVERY DAY 90 tablet 3  . ferrous sulfate 325 (65 FE) MG tablet TAKE 1 TABLET BY MOUTH EVERY DAY WITH BREAKFAST. PLEASE SCHEDULE FOLLOW UP FOR DIABETES 90 tablet 0  . fluticasone (FLONASE) 50 MCG/ACT nasal spray Place 2 sprays into both nostrils daily. 16 g 6  . gabapentin (NEURONTIN) 300 MG capsule TAKE 2 CAPSULES (600 MG TOTAL) BY MOUTH 3 (THREE) TIMES DAILY. 270 capsule 1  . glipiZIDE (GLUCOTROL XL) 10 MG 24 hr tablet Take 1 tablet (10 mg total) by mouth daily with breakfast. 90 tablet 1  . JARDIANCE 10 MG TABS tablet TAKE 1 TABLET BY MOUTH EVERY DAY 90 tablet 1  . Lancets (ONETOUCH DELICA PLUS XENMMH68G) MISC USE WHEN CHECKING BLOOD SUGAR AS DIRECTED 100 each 4  . losartan (COZAAR) 100 MG tablet TAKE 1 TABLET (100 MG TOTAL) BY MOUTH DAILY. 90 tablet 1  . losartan (COZAAR) 50 MG tablet TAKE 2 TABLETS BY MOUTH EVERY DAY 180 tablet 0  . MELATONIN PO Take by mouth.    . meloxicam (MOBIC) 15 MG tablet     . metFORMIN (GLUCOPHAGE) 500 MG tablet TAKE 2 TABLETS (1,000 MG TOTAL) BY MOUTH 2 (TWO) TIMES DAILY WITH A MEAL. 360 tablet 1  . omeprazole (PRILOSEC) 40 MG capsule Take 1 capsule (40 mg total) by mouth 2 (two)  times daily. 60 capsule 1  . ONETOUCH ULTRA test strip CHECK BLOOD SUGAR 1-2 TIMES DAILY 100 strip 12  . simvastatin (ZOCOR) 10 MG  tablet TAKE 1 TABLET BY MOUTH EVERYDAY AT BEDTIME 90 tablet 2  . guaiFENesin-codeine (ROBITUSSIN AC) 100-10 MG/5ML syrup Take 10 mLs by mouth 3 (three) times daily as needed for cough. (Patient not taking: Reported on 10/09/2019) 120 mL 0   No current facility-administered medications for this visit.    Allergies  Allergen Reactions  . Nsaids Other (See Comments)  . Reglan [Metoclopramide] Other (See Comments)    Tremor   . Estrogens Rash    Topical patch caused the rash    REVIEW OF SYSTEMS (negative unless checked):   Cardiac:  _0  Chest pain or chest pressure? _1  Shortness of breath upon activity? _2  Shortness of breath when lying flat? _3  Irregular heart rhythm?  Vascular:  _4  Pain in calf, thigh, or hip brought on by walking? _5  Pain in feet at night that wakes you up from your sleep? _6  Blood clot in your veins? _7  Leg swelling?  Pulmonary:  _8  Oxygen at home? _9  Productive cough? _10  Wheezing?  Neurologic:  _11  Sudden weakness in arms or legs? _12  Sudden numbness in arms or legs? _13  Sudden onset of difficult speaking or slurred speech? _14  Temporary loss of vision in one eye? _15  Problems with dizziness?  Gastrointestinal:  _16  Blood in stool? _17  Vomited blood?  Genitourinary:  _18  Burning when urinating? _19  Blood in urine?  Psychiatric:  _20  Major depression  Hematologic:  _21  Bleeding problems? _22  Problems with blood clotting?  Dermatologic:  _23  Rashes or ulcers?  Constitutional:  _24  Fever or chills?  Ear/Nose/Throat:  _25  Change in hearing? _26  Nose bleeds? _27  Sore throat?  Musculoskeletal:  _28  Back pain? _29  Joint pain? _30  Muscle pain?   Physical Examination     Vitals:   10/09/19 1212  BP: (!) 151/75  Pulse: 86  Resp: 14  Temp: (!) 97.5 F (36.4 C)  TempSrc: Temporal  SpO2: 97%  Weight: 195 lb (88.5 kg)   Height: _31  (1.651 m)   Body mass index is 32.45 kg/m.  General Alert, O x 3, WD, NAD  Head [place]/AT,    Neck Supple, mid-line trachea,    Pulmonary Sym exp, good B air movt,  Cardiac RRR, Nl S1, S2  Vascular Vessel Right Left  Radial Palpable Palpable  PT Palpable Faintly palpable  DP Not palpable Not palpable    Gastro- intestinal soft, non-distended, non-tender to palpation,   Musculo- skeletal M/S 5/5 throughout  , Extremities without ischemic changes  , no impressive edema BLE, visible varicose veins along the course of her GSV in LLE especially in distal thigh, No Lipodermatosclerosis present  Neurologic Cranial nerves 2-12 intact  Psychiatric Judgement intact, Mood & affect appropriate for pt's clinical situation  Dermatologic See M/S exam for extremity exam, No rashes otherwise noted  Lymphatic  Palpable lymph nodes: None    Non-invasive Vascular Imaging   LLE Venous Insufficiency Duplex :   LLE:  Negative for DVT and SVT,    GSV reflux noted at saphenofemoral junction and from mid thigh to proximal calf,   Negative for SSV reflux,   deep venous reflux noted in common femoral vein and mid femoral vein   Medical Decision Making   Karen Gonzales is a 74 y.o. female who presents with: BLE chronic venous insufficiency with painful varicose veins   Based on left lower extremity venous reflux study patient does have some superficial venous system reflux; vein diameter borderline diameter in her thigh  Patient also complains of burning pain and occasional  sharp shooting pain accompanied by erythema and areas of her varicose veins which sound like episodes of superficial thrombophlebitis  She has tried thigh-high compression in the past however these rolled down below her knee thus I have prescribed 20 to 30 mmHg knee-high compression to be worn daily  She has also been encouraged to elevate her legs as much as possible during the day  She can use NSAIDs for  discomfort associated with her veins especially during episodes of phlebitis  She will follow-up with Dr. Oneida Alar or Dr. Scot Dock in a period of 3 months   Dagoberto Ligas, PA-C [facility]: [phone]  10/09/2019, 1:17 PM  Clinic MD: Donzetta Matters

## 2019-10-15 ENCOUNTER — Ambulatory Visit (INDEPENDENT_AMBULATORY_CARE_PROVIDER_SITE_OTHER): Payer: PPO | Admitting: Podiatry

## 2019-10-15 ENCOUNTER — Other Ambulatory Visit: Payer: Self-pay

## 2019-10-15 ENCOUNTER — Encounter: Payer: Self-pay | Admitting: Podiatry

## 2019-10-15 VITALS — Temp 97.7°F

## 2019-10-15 DIAGNOSIS — M779 Enthesopathy, unspecified: Secondary | ICD-10-CM

## 2019-10-15 DIAGNOSIS — M7752 Other enthesopathy of left foot: Secondary | ICD-10-CM | POA: Diagnosis not present

## 2019-10-16 NOTE — Progress Notes (Signed)
Subjective:   Patient ID: Karen Gonzales, female   DOB: 74 y.o.   MRN: [id number]   HPI Patient states that the left ankle has been sore as it was last year and she had at least 3 months of complete relief   ROS      Objective:  Physical Exam  Neurovascular status intact with exquisite discomfort into the sinus tarsi left and lateral ankle gutter     Assessment:  Acute inflammation reoccurrence of inflammation sinus tarsi left and into the lateral ankle gutter     Plan:  Reviewed treatment options and at this point I reinjected the sinus tarsi left 3 mg Kenalog 5 mg Xylocaine and also into the lateral ankle gutter.  Patient will be seen back to recheck as needed and may require CT or MRI if symptoms persist

## 2019-10-30 LAB — HM DIABETES EYE EXAM

## 2019-11-09 ENCOUNTER — Other Ambulatory Visit: Payer: Self-pay | Admitting: Family Medicine

## 2019-11-10 ENCOUNTER — Other Ambulatory Visit: Payer: Self-pay | Admitting: Orthopedic Surgery

## 2019-11-10 DIAGNOSIS — S86312A Strain of muscle(s) and tendon(s) of peroneal muscle group at lower leg level, left leg, initial encounter: Secondary | ICD-10-CM | POA: Diagnosis not present

## 2019-11-10 DIAGNOSIS — M25572 Pain in left ankle and joints of left foot: Secondary | ICD-10-CM

## 2019-11-20 ENCOUNTER — Ambulatory Visit
Admission: RE | Admit: 2019-11-20 | Discharge: 2019-11-20 | Disposition: A | Discharge status: Home / Self Care | Payer: PPO | Source: Ambulatory Visit | Attending: Orthopedic Surgery | Admitting: Orthopedic Surgery

## 2019-11-20 ENCOUNTER — Other Ambulatory Visit: Payer: Self-pay

## 2019-11-20 DIAGNOSIS — M25572 Pain in left ankle and joints of left foot: Secondary | ICD-10-CM

## 2019-11-20 DIAGNOSIS — S86312A Strain of muscle(s) and tendon(s) of peroneal muscle group at lower leg level, left leg, initial encounter: Secondary | ICD-10-CM | POA: Diagnosis not present

## 2019-11-20 DIAGNOSIS — M25472 Effusion, left ankle: Secondary | ICD-10-CM | POA: Diagnosis not present

## 2019-11-23 ENCOUNTER — Telehealth: Payer: Self-pay | Admitting: Family Medicine

## 2019-11-23 NOTE — Progress Notes (Signed)
  Chronic Care Management   Note  11/23/2019 Name: Karen Gonzales MRN: [id number] DOB: 24-Jul-1946  Karen Gonzales is a 74 y.o. year old female who is a primary care patient of Birdie Riddle, Aundra Millet, MD. I reached out to Karen Gonzales by phone today in response to a referral sent by Ms. Doreen Salvage PCP, [name], MD.   Ms. Savin was given information about Chronic Care Management services today including:  1. CCM service includes personalized support from designated clinical staff supervised by her physician, including individualized plan of care and coordination with other care providers 2. 24/7 contact phone numbers for assistance for urgent and routine care needs. 3. Service will only be billed when office clinical staff spend 20 minutes or more in a month to coordinate care. 4. Only one practitioner may furnish and bill the service in a calendar month. 5. The patient may stop CCM services at any time (effective at the end of the month) by phone call to the office staff.   Patient agreed to services and verbal consent obtained.   Follow up plan:   Earney [facility] [employer] Scheduler

## 2019-11-24 ENCOUNTER — Telehealth: Payer: Self-pay | Admitting: Family Medicine

## 2019-11-24 DIAGNOSIS — S86312D Strain of muscle(s) and tendon(s) of peroneal muscle group at lower leg level, left leg, subsequent encounter: Secondary | ICD-10-CM | POA: Diagnosis not present

## 2019-11-24 NOTE — Telephone Encounter (Signed)
Paperwork given to PCP.

## 2019-11-24 NOTE — Telephone Encounter (Signed)
Called pt to inform. No answer and no VM.

## 2019-11-24 NOTE — Telephone Encounter (Signed)
I have placed a surgery clearance form in the bin upfront with a charge sheet.

## 2019-11-24 NOTE — Telephone Encounter (Signed)
Pt has appt upcoming on 4/19- will do surgical clearance at this time.

## 2019-11-27 ENCOUNTER — Other Ambulatory Visit: Payer: Self-pay | Admitting: Family Medicine

## 2019-11-30 ENCOUNTER — Other Ambulatory Visit: Payer: Self-pay

## 2019-11-30 ENCOUNTER — Encounter: Payer: Self-pay | Admitting: Family Medicine

## 2019-11-30 ENCOUNTER — Other Ambulatory Visit (INDEPENDENT_AMBULATORY_CARE_PROVIDER_SITE_OTHER): Payer: PPO | Admitting: General Practice

## 2019-11-30 ENCOUNTER — Ambulatory Visit (INDEPENDENT_AMBULATORY_CARE_PROVIDER_SITE_OTHER): Payer: PPO | Admitting: Family Medicine

## 2019-11-30 VITALS — BP 142/70 | HR 81 | Temp 97.9°F | Resp 16 | Ht 65.0 in | Wt 190.0 lb

## 2019-11-30 DIAGNOSIS — Z01818 Encounter for other preprocedural examination: Secondary | ICD-10-CM

## 2019-11-30 DIAGNOSIS — E1140 Type 2 diabetes mellitus with diabetic neuropathy, unspecified: Secondary | ICD-10-CM | POA: Diagnosis not present

## 2019-11-30 DIAGNOSIS — Z0000 Encounter for general adult medical examination without abnormal findings: Secondary | ICD-10-CM | POA: Diagnosis not present

## 2019-11-30 LAB — BASIC METABOLIC PANEL
BUN: 20 mg/dL (ref 6–23)
CO2: 23 mEq/L (ref 19–32)
Calcium: 9.6 mg/dL (ref 8.4–10.5)
Chloride: 99 mEq/L (ref 96–112)
Creatinine, Ser: 0.63 mg/dL (ref 0.40–1.20)
GFR: 92.76 mL/min (ref >60.00)
Glucose, Bld: 140 mg/dL — ABNORMAL HIGH (ref 70–99)
Potassium: 4.2 mEq/L (ref 3.5–5.1)
Sodium: 133 mEq/L — ABNORMAL LOW (ref 135–145)

## 2019-11-30 LAB — HEPATIC FUNCTION PANEL
ALT: 52 U/L — ABNORMAL HIGH (ref 0–35)
AST: 44 U/L — ABNORMAL HIGH (ref 0–37)
Albumin: 4.5 g/dL (ref 3.5–5.2)
Alkaline Phosphatase: 53 U/L (ref 39–117)
Bilirubin, Direct: 0.1 mg/dL (ref 0.0–0.3)
Total Bilirubin: 0.6 mg/dL (ref 0.2–1.2)
Total Protein: 7.7 g/dL (ref 6.0–8.3)

## 2019-11-30 LAB — CBC WITH DIFFERENTIAL/PLATELET
Basophils Absolute: 0.1 10*3/uL (ref 0.0–0.1)
Basophils Relative: 0.8 % (ref 0.0–3.0)
Eosinophils Absolute: 0.3 10*3/uL (ref 0.0–0.7)
Eosinophils Relative: 3.3 % (ref 0.0–5.0)
HCT: 38.3 % (ref 36.0–46.0)
Hemoglobin: 12.6 g/dL (ref 12.0–15.0)
Lymphocytes Relative: 30.8 % (ref 12.0–46.0)
Lymphs Abs: 2.4 10*3/uL (ref 0.7–4.0)
MCHC: 32.9 g/dL (ref 30.0–36.0)
MCV: 83 fl (ref 78.0–100.0)
Monocytes Absolute: 0.8 10*3/uL (ref 0.1–1.0)
Monocytes Relative: 10.6 % (ref 3.0–12.0)
Neutro Abs: 4.3 10*3/uL (ref 1.4–7.7)
Neutrophils Relative %: 54.5 % (ref 43.0–77.0)
Platelets: 218 10*3/uL (ref 150.0–400.0)
RBC: 4.61 Mil/uL (ref 3.87–5.11)
RDW: 13.9 % (ref 11.5–15.5)
WBC: 7.8 10*3/uL (ref 4.0–10.5)

## 2019-11-30 LAB — TSH: TSH: 1.34 u[IU]/mL (ref 0.35–4.50)

## 2019-11-30 LAB — LIPID PANEL
Cholesterol: 147 mg/dL (ref 0–200)
HDL: 43.7 mg/dL (ref >39.00)
LDL Cholesterol: 64 mg/dL (ref 0–99)
NonHDL: 103.66
Total CHOL/HDL Ratio: 3
Triglycerides: 199 mg/dL — ABNORMAL HIGH (ref 0.0–149.0)
VLDL: 39.8 mg/dL (ref 0.0–40.0)

## 2019-11-30 LAB — HEMOGLOBIN A1C: Hgb A1c MFr Bld: 8.3 % — ABNORMAL HIGH (ref 4.6–6.5)

## 2019-11-30 NOTE — Patient Instructions (Signed)
Follow up in 3-4 months to recheck diabetes We'll notify you of your lab results and make any changes if needed Continue to work on healthy diet and exercise as able (once surgery is done) Call with any questions or concerns Stay Safe!  Stay Healthy! GOOD LUCK WITH SURGERY!!!

## 2019-11-30 NOTE — Progress Notes (Signed)
   Subjective:    Patient ID: Karen Gonzales, female    DOB: 26-Nov-1945, 74 y.o.   MRN: [id number]  HPI CPE- UTD on mammo, colonoscopy, eye exam, immunizations.  Due for DEXA.  Pt is down 5 lbs since last visit.   Review of Systems Patient reports no vision/ hearing changes, adenopathy,fever, weight change,  persistant/recurrent hoarseness , swallowing issues, chest pain, palpitations, edema, persistant/recurrent cough, hemoptysis, dyspnea (rest/exertional/paroxysmal nocturnal), gastrointestinal bleeding (melena, rectal bleeding), abdominal pain, significant heartburn, bowel changes, GU symptoms (dysuria, hematuria, incontinence), Gyn symptoms (abnormal  bleeding, pain),  syncope, focal weakness, memory loss, numbness & tingling, skin/hair/nail changes, abnormal bruising or bleeding, anxiety, or depression.   This visit occurred during the SARS-CoV-2 public health emergency.  Safety protocols were in place, including screening questions prior to the visit, additional usage of staff PPE, and extensive cleaning of exam room while observing appropriate contact time as indicated for disinfecting solutions.       Objective:   Physical Exam General Appearance:    Alert, cooperative, no distress, appears stated age  Head:    Normocephalic, without obvious abnormality, atraumatic  Eyes:    PERRL, conjunctiva/corneas clear, EOM's intact, fundi    benign, both eyes  Ears:    Normal TM's and external ear canals, both ears  Nose:   Deferred due to COVID  Throat:   Neck:   Supple, symmetrical, trachea midline, no adenopathy;    Thyroid: no enlargement/tenderness/nodules  Back:     Symmetric, no curvature, ROM normal, no CVA tenderness  Lungs:     Clear to auscultation bilaterally, respirations unlabored  Chest Wall:    No tenderness or deformity   Heart:    Regular rate and rhythm, I-II/VI SEM murmur, no rub   or gallop  Breast Exam:    Deferred to mammo  Abdomen:     Soft, non-tender, bowel  sounds active all four quadrants,    no masses, no organomegaly  Genitalia:    Deferred  Rectal:    Extremities:   Extremities normal, atraumatic, no cyanosis or edema  Pulses:   2+ and symmetric all extremities  Skin:   Skin color, texture, turgor normal, no rashes or lesions  Lymph nodes:   Cervical, supraclavicular, and axillary nodes normal  Neurologic:   CNII-XII intact, normal strength, sensation and reflexes    throughout          Assessment & Plan:

## 2019-11-30 NOTE — Assessment & Plan Note (Signed)
Pt's PE unchanged from previous and WNL.  UTD on mammo, colonoscopy, immunizations.  Due for DEXA but wants to wait until after ankle surgery.  Check labs.  Anticipatory guidance provided.

## 2019-11-30 NOTE — Assessment & Plan Note (Signed)
Chronic problem.  UTD on eye exam, foot exam, and on ARB for renal protection.  Check labs.  Adjust meds prn

## 2019-12-03 NOTE — Telephone Encounter (Signed)
Forms completed by pcp and faxed to the requesting office

## 2019-12-04 ENCOUNTER — Other Ambulatory Visit: Payer: Self-pay | Admitting: General Practice

## 2019-12-04 DIAGNOSIS — I10 Essential (primary) hypertension: Secondary | ICD-10-CM

## 2019-12-04 DIAGNOSIS — E785 Hyperlipidemia, unspecified: Secondary | ICD-10-CM

## 2019-12-04 DIAGNOSIS — E1140 Type 2 diabetes mellitus with diabetic neuropathy, unspecified: Secondary | ICD-10-CM

## 2019-12-07 DIAGNOSIS — K219 Gastro-esophageal reflux disease without esophagitis: Secondary | ICD-10-CM | POA: Diagnosis not present

## 2019-12-08 ENCOUNTER — Other Ambulatory Visit: Payer: Self-pay | Admitting: Family Medicine

## 2019-12-09 ENCOUNTER — Other Ambulatory Visit: Payer: PPO

## 2019-12-12 ENCOUNTER — Other Ambulatory Visit: Payer: Self-pay | Admitting: Family Medicine

## 2019-12-16 ENCOUNTER — Ambulatory Visit: Payer: PPO

## 2019-12-16 DIAGNOSIS — E785 Hyperlipidemia, unspecified: Secondary | ICD-10-CM

## 2019-12-16 DIAGNOSIS — E1140 Type 2 diabetes mellitus with diabetic neuropathy, unspecified: Secondary | ICD-10-CM

## 2019-12-16 DIAGNOSIS — I10 Essential (primary) hypertension: Secondary | ICD-10-CM

## 2019-12-16 NOTE — Patient Instructions (Addendum)
Visit Information  Goals Addressed            This Visit's Progress   . A1c <7.5%       CARE PLAN ENTRY Current Barriers:  . Diabetes: type 2; complicated by chronic medical conditions including high blood pressure Lab Results  Component Value Date   HGBA1C 8.3 (H) 11/30/2019 .   Lab Results  Component Value Date   CREATININE 0.63 11/30/2019   CREATININE 0.67 08/31/2019   CREATININE 0.74 05/28/2019 .  Current antihyperglycemic regimen:  o Glipizide 10 mg daily o Jardiance 10 mg daily  o Metformin 1000 mg twice daily  . Denies hypoglycemic symptoms, including dizziness, lightheadedness, shaking, sweating. . Current exercise: limited due to upcoming ankle surgery. Would like to resume walking.   Marland Kitchen Recent FBG Readings: 152 today, typically 140s, 150s. . Cardiovascular risk reduction: Pharmacist Clinical Goal(s):  Marland Kitchen Over the next 180 days, patient will work with PharmD and primary care provider to address A1c goal <7.5%. Interventions: . Comprehensive medication review performed, medication list updated in electronic medical record . Inter-disciplinary care team collaboration . Jardiance patient assistance Patient Self Care Activities:  . Patient will check blood glucose once daily, document, and provide at future appointments . Patient will focus on medication adherence by continuing current medication management  . Patient will take medications as prescribed . Patient will contact provider with any episodes of hypoglycemia . Patient will report any questions or concerns to provider  Initial goal documentation.    . Blood pressure <140/90       CARE PLAN ENTRY Current Barriers:  . Current antihypertensive regimen:    Losartan 100 mg daily   Amlodipine 5 mg daily  . Last practice recorded BP readings:  BP Readings from Last 3 Encounters:  11/30/19 (!) 142/70  10/09/19 (!) 151/75  08/31/19 124/84  Pharmacist Clinical Goal(s):  Marland Kitchen Over the next 180 days, patient will  work with PharmD and providers to optimize antihypertensive regimen o BP <140/90 Interventions: . Inter-disciplinary care team collaboration . Comprehensive medication review performed; medication list updated in the electronic medical record.  Patient Self Care Activities:  . Patient will continue to check BP routinely, at least once weekly - document, and provide at future appointments . Patient will focus on medication adherence by continuing current medication management.  Initial goal documentation.    . LDL Cholesterol <70       CARE PLAN ENTRY Current Barriers:  . Current antihyperlipidemic regimen:  o Simvastatin 10 mg daily . Previous antihyperlipidemic medications tried: Pravastatin 20 mg   . Most recent lipid panel:     Component Value Date/Time   CHOL 147 11/30/2019 0949   TRIG 199.0 (H) 11/30/2019 0949   HDL 43.70 11/30/2019 0949   CHOLHDL 3 11/30/2019 0949   VLDL 39.8 11/30/2019 0949   LDLCALC 64 11/30/2019 0949   LDLCALC 75 10/24/2017 1559   LDLDIRECT 104.0 04/08/2017 1125  Pharmacist Clinical Goal(s):  Marland Kitchen Over the next 180 days, patient will work with PharmD and providers towards optimized antihyperlipidemic therapy o LDL <70 Interventions: . Comprehensive medication review performed; medication list updated in electronic medical record.  Patient Self Care Activities:  . Patient will focus on medication adherence by continuing current medication management.  Initial goal documentation.       Ms. Presswood was given information about Chronic Care Management services today including:  1. CCM service includes personalized support from designated clinical staff supervised by her physician, including individualized plan of  care and coordination with other care providers 2. 24/7 contact phone numbers for assistance for urgent and routine care needs. 3. Standard insurance, coinsurance, copays and deductibles apply for chronic care management only during months in which  we provide at least 20 minutes of these services. Most insurances cover these services at 100%, however patients may be responsible for any copay, coinsurance and/or deductible if applicable. This service may help you avoid the need for more expensive face-to-face services. 4. Only one practitioner may furnish and bill the service in a calendar month. 5. The patient may stop CCM services at any time (effective at the end of the month) by phone call to the office staff.  Patient agreed to services and verbal consent obtained.   The patient verbalized understanding of instructions provided today and agreed to receive a mailed copy of patient instruction and/or educational materials. Telephone follow up appointment with pharmacy team member scheduled for: See next appointment with "Care Management Staff" under "What's Next" below.   Thank you!  Madelin Rear, Pharm.D. Clinical Pharmacist [facility] [phone]  Diabetes Mellitus and Nutrition, Adult When you have diabetes (diabetes mellitus), it is very important to have healthy eating habits because your blood sugar (glucose) levels are greatly affected by what you eat and drink. Eating healthy foods in the appropriate amounts, at about the same times every day, can help you:  Control your blood glucose.  Lower your risk of heart disease.  Improve your blood pressure.  Reach or maintain a healthy weight. Every person with diabetes is different, and each person has different needs for a meal plan. Your health care provider may recommend that you work with a diet and nutrition specialist (dietitian) to make a meal plan that is best for you. Your meal plan may vary depending on factors such as:  The calories you need.  The medicines you take.  Your weight.  Your blood glucose, blood pressure, and cholesterol levels.  Your activity level.  Other health conditions you have, such as heart or kidney  disease. How do carbohydrates affect me? Carbohydrates, also called carbs, affect your blood glucose level more than any other type of food. Eating carbs naturally raises the amount of glucose in your blood. Carb counting is a method for keeping track of how many carbs you eat. Counting carbs is important to keep your blood glucose at a healthy level, especially if you use insulin or take certain oral diabetes medicines. It is important to know how many carbs you can safely have in each meal. This is different for every person. Your dietitian can help you calculate how many carbs you should have at each meal and for each snack. Foods that contain carbs include:  Bread, cereal, rice, pasta, and crackers.  Potatoes and corn.  Peas, beans, and lentils.  Milk and yogurt.  Fruit and juice.  Desserts, such as cakes, cookies, ice cream, and candy. How does alcohol affect me? Alcohol can cause a sudden decrease in blood glucose (hypoglycemia), especially if you use insulin or take certain oral diabetes medicines. Hypoglycemia can be a life-threatening condition. Symptoms of hypoglycemia (sleepiness, dizziness, and confusion) are similar to symptoms of having too much alcohol. If your health care provider says that alcohol is safe for you, follow these guidelines:  Limit alcohol intake to no more than 1 drink per day for nonpregnant women and 2 drinks per day for men. One drink equals 12 oz of beer, 5 oz  of wine, or 1 oz of hard liquor.  Do not drink on an empty stomach.  Keep yourself hydrated with water, diet soda, or unsweetened iced tea.  Keep in mind that regular soda, juice, and other mixers may contain a lot of sugar and must be counted as carbs. What are tips for following this plan?  Reading food labels  Start by checking the serving size on the "Nutrition Facts" label of packaged foods and drinks. The amount of calories, carbs, fats, and other nutrients listed on the label is based  on one serving of the item. Many items contain more than one serving per package.  Check the total grams (g) of carbs in one serving. You can calculate the number of servings of carbs in one serving by dividing the total carbs by 15. For example, if a food has 30 g of total carbs, it would be equal to 2 servings of carbs.  Check the number of grams (g) of saturated and trans fats in one serving. Choose foods that have low or no amount of these fats.  Check the number of milligrams (mg) of salt (sodium) in one serving. Most people should limit total sodium intake to less than 2,300 mg per day.  Always check the nutrition information of foods labeled as "low-fat" or "nonfat". These foods may be higher in added sugar or refined carbs and should be avoided.  Talk to your dietitian to identify your daily goals for nutrients listed on the label. Shopping  Avoid buying canned, premade, or processed foods. These foods tend to be high in fat, sodium, and added sugar.  Shop around the outside edge of the grocery store. This includes fresh fruits and vegetables, bulk grains, fresh meats, and fresh dairy. Cooking  Use low-heat cooking methods, such as baking, instead of high-heat cooking methods like deep frying.  Cook using healthy oils, such as olive, canola, or sunflower oil.  Avoid cooking with butter, cream, or high-fat meats. Meal planning  Eat meals and snacks regularly, preferably at the same times every day. Avoid going long periods of time without eating.  Eat foods high in fiber, such as fresh fruits, vegetables, beans, and whole grains. Talk to your dietitian about how many servings of carbs you can eat at each meal.  Eat 4-6 ounces (oz) of lean protein each day, such as lean meat, chicken, fish, eggs, or tofu. One oz of lean protein is equal to: ? 1 oz of meat, chicken, or fish. ? 1 egg. ?  cup of tofu.  Eat some foods each day that contain healthy fats, such as avocado, nuts,  seeds, and fish. Lifestyle  Check your blood glucose regularly.  Exercise regularly as told by your health care provider. This may include: ? 150 minutes of moderate-intensity or vigorous-intensity exercise each week. This could be brisk walking, biking, or water aerobics. ? Stretching and doing strength exercises, such as yoga or weightlifting, at least 2 times a week.  Take medicines as told by your health care provider.  Do not use any products that contain nicotine or tobacco, such as cigarettes and e-cigarettes. If you need help quitting, ask your health care provider.  Work with a Social worker or diabetes educator to identify strategies to manage stress and any emotional and social challenges. Questions to ask a health care provider  Do I need to meet with a diabetes educator?  Do I need to meet with a dietitian?  What number can I call if I  have questions?  When are the best times to check my blood glucose? Where to find more information:  American Diabetes Association: diabetes.org  Academy of Nutrition and Dietetics: www.eatright.[employer] of Diabetes and Digestive and Kidney Diseases (NIH): [employer] Summary  A healthy meal plan will help you control your blood glucose and maintain a healthy lifestyle.  Working with a diet and nutrition specialist (dietitian) can help you make a meal plan that is best for you.  Keep in mind that carbohydrates (carbs) and alcohol have immediate effects on your blood glucose levels. It is important to count carbs and to use alcohol carefully. This information is not intended to replace advice given to you by your health care provider. Make sure you discuss any questions you have with your health care provider. Document Revised: 07/12/2017 Document Reviewed: 09/03/2016 Elsevier Patient Education  2020 [employer].

## 2019-12-16 NOTE — Progress Notes (Signed)
Chronic Care Management Pharmacy  Name: Karen Gonzales  MRN: [id number] DOB: Sep 07, 1945  Chief Complaint/ HPI Karen Gonzales,  74 y.o. , female presents for their Initial CCM visit with the clinical pharmacist via telephone due to COVID-19 Pandemic.  PCP : [name], MD  Their chronic conditions include: HTN, HLD, type 2 DM, obesity, GERD.  Office Visits:  4/19 (PCP): upcoming ankle surgery, will get DEXA after. Increase exercise after surgery. Eye and foot exam up to date.   1/18 (PCP): restarted Jardiance. Consult Visit:  2/26 (Vasc Surg): c/o burning pain and occasional sharp shooting pain accompanied by erythema and areas of varicose veins. Prescribed knee-high compression to be worn daily. Elevate legs, use NSAIDs for discomfort  Outpatient Encounter Medications as of 12/16/2019  Medication Sig  . amLODipine (NORVASC) 5 MG tablet Take 1 tablet (5 mg total) by mouth daily.  . Blood Glucose Monitoring Suppl (ONE TOUCH ULTRA 2) w/Device KIT Pt uses glucometer to check sugars twice daily. Dx E11.9  . cetirizine (ZYRTEC) 10 MG tablet TAKE 1 TABLET BY MOUTH EVERY DAY  . ferrous sulfate 325 (65 FE) MG tablet TAKE 1 TABLET BY MOUTH EVERY DAY WITH BREAKFAST. PLEASE SCHEDULE FOLLOW UP FOR DIABETES  . fluticasone (FLONASE) 50 MCG/ACT nasal spray Place 2 sprays into both nostrils daily.  Marland Kitchen gabapentin (NEURONTIN) 300 MG capsule TAKE 2 CAPSULES (600 MG TOTAL) BY MOUTH 3 (THREE) TIMES DAILY.  Marland Kitchen glipiZIDE (GLUCOTROL XL) 10 MG 24 hr tablet TAKE 1 TABLET (10 MG TOTAL) BY MOUTH DAILY WITH BREAKFAST.  Marland Kitchen guaiFENesin-codeine (ROBITUSSIN AC) 100-10 MG/5ML syrup Take 10 mLs by mouth 3 (three) times daily as needed for cough.  Marland Kitchen JARDIANCE 10 MG TABS tablet TAKE 1 TABLET BY MOUTH EVERY DAY  . Lancets (ONETOUCH DELICA PLUS [id number]) MISC USE WHEN CHECKING BLOOD SUGAR AS DIRECTED  . losartan (COZAAR) 100 MG tablet TAKE 1 TABLET (100 MG TOTAL) BY MOUTH DAILY.  Marland Kitchen losartan (COZAAR) 50 MG tablet  TAKE 2 TABLETS BY MOUTH EVERY DAY  . MELATONIN PO Take by mouth.  . meloxicam (MOBIC) 15 MG tablet   . metFORMIN (GLUCOPHAGE) 500 MG tablet TAKE 2 TABLETS (1,000 MG TOTAL) BY MOUTH 2 (TWO) TIMES DAILY WITH A MEAL.  Marland Kitchen omeprazole (PRILOSEC) 40 MG capsule Take 1 capsule (40 mg total) by mouth 2 (two) times daily. (Patient taking differently: Take 40 mg by mouth daily. )  . ONETOUCH ULTRA test strip CHECK BLOOD SUGAR 1-2 TIMES DAILY  . simvastatin (ZOCOR) 10 MG tablet TAKE 1 TABLET BY MOUTH EVERYDAY AT BEDTIME  . polyethylene glycol-electrolytes (NULYTELY) 420 g solution TAKE AS DIRECTED BY MOUTH ONCE FOR 1 DOSE   No facility-administered encounter medications on file as of 12/16/2019.   Current Diagnosis/Assessment: Goals Addressed            This Visit's Progress   . A1c <7.5%       CARE PLAN ENTRY Current Barriers:  . Diabetes: type 2; complicated by chronic medical conditions including high blood pressure Lab Results  Component Value Date   HGBA1C 8.3 (H) 11/30/2019 .   Lab Results  Component Value Date   CREATININE 0.63 11/30/2019   CREATININE 0.67 08/31/2019   CREATININE 0.74 05/28/2019 .  Current antihyperglycemic regimen:  o Glipizide 10 mg daily o Jardiance 10 mg daily  o Metformin 1000 mg twice daily  . Denies hypoglycemic symptoms, including dizziness, lightheadedness, shaking, sweating. . Current exercise: limited due to upcoming ankle surgery. Would like  to resume walking.   Marland Kitchen Recent FBG Readings: 152 today, typically 140s, 150s. . Cardiovascular risk reduction: Pharmacist Clinical Goal(s):  Marland Kitchen Over the next 180 days, patient will work with PharmD and primary care provider to address A1c goal <7.5%. Interventions: . Comprehensive medication review performed, medication list updated in electronic medical record . Inter-disciplinary care team collaboration . Jardiance patient assistance Patient Self Care Activities:  . Patient will check blood glucose once daily,  document, and provide at future appointments . Patient will focus on medication adherence by continuing current medication management  . Patient will take medications as prescribed . Patient will contact provider with any episodes of hypoglycemia . Patient will report any questions or concerns to provider  Initial goal documentation.    . Blood pressure <140/90       CARE PLAN ENTRY Current Barriers:  . Current antihypertensive regimen:    Losartan 100 mg daily   Amlodipine 5 mg daily  . Last practice recorded BP readings:  BP Readings from Last 3 Encounters:  11/30/19 (!) 142/70  10/09/19 (!) 151/75  08/31/19 124/84  Pharmacist Clinical Goal(s):  Marland Kitchen Over the next 180 days, patient will work with PharmD and providers to optimize antihypertensive regimen o BP <140/90 Interventions: . Inter-disciplinary care team collaboration . Comprehensive medication review performed; medication list updated in the electronic medical record.  Patient Self Care Activities:  . Patient will continue to check BP routinely, at least once weekly - document, and provide at future appointments . Patient will focus on medication adherence by continuing current medication management.  Initial goal documentation.    . LDL Cholesterol <70       CARE PLAN ENTRY Current Barriers:  . Current antihyperlipidemic regimen:  o Simvastatin 10 mg daily . Previous antihyperlipidemic medications tried: Pravastatin 20 mg   . Most recent lipid panel:     Component Value Date/Time   CHOL 147 11/30/2019 0949   TRIG 199.0 (H) 11/30/2019 0949   HDL 43.70 11/30/2019 0949   CHOLHDL 3 11/30/2019 0949   VLDL 39.8 11/30/2019 0949   LDLCALC 64 11/30/2019 0949   LDLCALC 75 10/24/2017 1559   LDLDIRECT 104.0 04/08/2017 1125  Pharmacist Clinical Goal(s):  Marland Kitchen Over the next 180 days, patient will work with PharmD and providers towards optimized antihyperlipidemic therapy o LDL <70 Interventions: . Comprehensive medication  review performed; medication list updated in electronic medical record.  Patient Self Care Activities:  . Patient will focus on medication adherence by continuing current medication management.  Initial goal documentation.       Hypertension   Office blood pressures are  BP Readings from Last 3 Encounters:  11/30/19 (!) 142/70  10/09/19 (!) 151/75  08/31/19 124/84   Patient is currently controlled on the following medications:   Losartan 100 mg daily   Amlodipine 5 mg daily   Patient checks BP at home infrequently.  Patient home BP readings are ranging: n/a. None provided.  We discussed diet and exercise extensively. Plans to resume walking for exercise after foot surgery.   Plan  Continue current medications and control with diet and exercise.   Diabetes   Recent Relevant Labs: Lab Results  Component Value Date/Time   HGBA1C 8.3 (H) 11/30/2019 09:49 AM   HGBA1C 8.3 (H) 08/31/2019 10:11 AM   MICROALBUR 24.8 11/04/2014 12:00 AM   Last diabetic eye exam:  Lab Results  Component Value Date/Time   HMDIABEYEEXA No Retinopathy 10/30/2019 12:00 AM    A1c ranges 7.4-9.4% over  the last 3 years. Most recent 8.3% (11/30/2019). Currently on three agents, has been taking Jardiance, only while outside of coverage gap. Is not able to recall what she is taking without reading from bottles. Mentions that her friends have told her she needs to get off of metformin "because it's bad for you".   Checking BG: Daily. Recent FBG Readings: 152 today, typically 140s, 150s. Denies any lows over last 2 weeks. We discussed diet recommendations, has been avoiding sweets and breads. Walking has been limited due to upcoming ankle surgery week of 12/21/2019. Plans to start walking once again following surgery.   We discussed: diet and exercise extensively and patient was counseled on each medication.   Patient is currently uncontrolled on the following medications:   Glipizide XL 10 mg  daily  Jardiance 10 mg daily  Metformin 1000 mg twice daily   Plan  Continue current medications. Apply for Jardiance patient assistance.    Hyperlipidemia   Lipid Panel     Component Value Date/Time   CHOL 147 11/30/2019 0949   TRIG 199.0 (H) 11/30/2019 0949   HDL 43.70 11/30/2019 0949   CHOLHDL 3 11/30/2019 0949   VLDL 39.8 11/30/2019 0949   LDLCALC 64 11/30/2019 0949   LDLCALC 75 10/24/2017 1559   LDLDIRECT 104.0 04/08/2017 1125    The 10-year ASCVD risk score Mikey Bussing DC Jr., et al., 2013) is: 32%   Values used to calculate the score:     Age: 73 years     Sex: Female     Is Non-Hispanic African American: No     Diabetic: Yes     Tobacco smoker: No     Systolic Blood Pressure: 458 mmHg     Is BP treated: Yes     HDL Cholesterol: 43.7 mg/dL     Total Cholesterol: 147 mg/dL   Took pravastatin 20 mg 2012-2016, no other statins on file other than current simvastatin 10 mg. Cholesterol controlled, TG elevated. Type 2 diabetes, ASCVD 32% - currently taking simvastatin 10 mg daily. Denies any muscle or abdominal pain or n/v. Moderate-High intensity statin should be considered following 12/2019 surgery.    We discussed: diet and exercise extensively.  Plan  Continue current medications and control with diet and exercise. Consider starting moderate-high intensity statin following surgery.   GERD / gastroparesis   Previously suffered from EPS while taking metoclopramide, currently controlled on omeprazole 40 mg daily. Will be switching to omeprazole 20 mg this week to see if acid can be controlled at lower dose.   Patient denies dysphagia, heartburn or nausea.   Plan   Continue current medication.   Vaccines   Reviewed and discussed patient's vaccination history. Shingrix due. Pt counseled on shingrix and agrees to receive.    Immunization History  Administered Date(s) Administered  . Fluad Quad(high Dose 65+) 05/28/2019  . Influenza, High Dose Seasonal PF  04/28/2018  . Influenza,inj,Quad PF,6+ Mos 06/14/2015, 05/14/2016, 04/08/2017  . Pneumococcal Conjugate-13 10/23/2013  . Pneumococcal Polysaccharide-23 11/04/2014  . Tdap 03/13/2011   Plan  Recommended patient receive Shingrix vaccine in pharmacy.   Medication Management   Pt uses [facility] for all medications. No issues with current medication management.   Plan  Continue current medication management strategy.  Follow up: 3 month phone visit ______________ Visit Information SDOH (Social Determinants of Health) assessments performed: Yes.   Food Insecurity: No Food Insecurity  . Worried About Charity fundraiser in the Last Year: Never true  . Ran  Out of Food in the Last Year: Never true     Transportation Needs: No Transportation Needs  . Lack of Transportation (Medical): No  . Lack of Transportation (Non-Medical): No    Ms. Bogosian was given information about Chronic Care Management services today including:  1. CCM service includes personalized support from designated clinical staff supervised by her physician, including individualized plan of care and coordination with other care providers 2. 24/7 contact phone numbers for assistance for urgent and routine care needs. 3. Standard insurance, coinsurance, copays and deductibles apply for chronic care management only during months in which we provide at least 20 minutes of these services. Most insurances cover these services at 100%, however patients may be responsible for any copay, coinsurance and/or deductible if applicable. This service may help you avoid the need for more expensive face-to-face services. 4. Only one practitioner may furnish and bill the service in a calendar month. 5. The patient may stop CCM services at any time (effective at the end of the month) by phone call to the office staff.  Patient agreed to services and verbal consent obtained.   Madelin Rear, Pharm.D. Clinical Pharmacist [place]  [facility] at [facility] [phone]

## 2019-12-23 DIAGNOSIS — X58XXXA Exposure to other specified factors, initial encounter: Secondary | ICD-10-CM | POA: Diagnosis not present

## 2019-12-23 DIAGNOSIS — G8918 Other acute postprocedural pain: Secondary | ICD-10-CM | POA: Diagnosis not present

## 2019-12-23 DIAGNOSIS — M66872 Spontaneous rupture of other tendons, left ankle and foot: Secondary | ICD-10-CM | POA: Diagnosis not present

## 2019-12-23 DIAGNOSIS — Y999 Unspecified external cause status: Secondary | ICD-10-CM | POA: Diagnosis not present

## 2019-12-23 DIAGNOSIS — M12272 Villonodular synovitis (pigmented), left ankle and foot: Secondary | ICD-10-CM | POA: Diagnosis not present

## 2019-12-23 DIAGNOSIS — S96802A Unspecified injury of other specified muscles and tendons at ankle and foot level, left foot, initial encounter: Secondary | ICD-10-CM | POA: Diagnosis not present

## 2019-12-23 DIAGNOSIS — M65872 Other synovitis and tenosynovitis, left ankle and foot: Secondary | ICD-10-CM | POA: Diagnosis not present

## 2020-01-12 DIAGNOSIS — M12272 Villonodular synovitis (pigmented), left ankle and foot: Secondary | ICD-10-CM | POA: Diagnosis not present

## 2020-01-13 ENCOUNTER — Ambulatory Visit: Payer: PPO | Admitting: Vascular Surgery

## 2020-01-14 ENCOUNTER — Ambulatory Visit: Payer: PPO | Admitting: Physical Therapy

## 2020-01-17 ENCOUNTER — Other Ambulatory Visit: Payer: Self-pay | Admitting: Family Medicine

## 2020-01-18 ENCOUNTER — Other Ambulatory Visit: Payer: Self-pay

## 2020-01-18 ENCOUNTER — Ambulatory Visit: Payer: PPO | Attending: Orthopedic Surgery

## 2020-01-18 DIAGNOSIS — R600 Localized edema: Secondary | ICD-10-CM

## 2020-01-18 DIAGNOSIS — M25572 Pain in left ankle and joints of left foot: Secondary | ICD-10-CM | POA: Diagnosis not present

## 2020-01-18 DIAGNOSIS — M6281 Muscle weakness (generalized): Secondary | ICD-10-CM

## 2020-01-18 DIAGNOSIS — R2689 Other abnormalities of gait and mobility: Secondary | ICD-10-CM | POA: Diagnosis not present

## 2020-01-18 DIAGNOSIS — M25672 Stiffness of left ankle, not elsewhere classified: Secondary | ICD-10-CM | POA: Diagnosis not present

## 2020-01-18 NOTE — Patient Instructions (Signed)
Access Code: [email or web address] URL: [email or web address] Date: 01/18/2020 Prepared by: Claiborne Billings  Exercises Supine Single Leg Ankle Pumps - 5 x daily - 7 x weekly - 2 sets - 10 reps Side to Side Weight Shift with Counter Support - 3 x daily - 7 x weekly - 2 sets - 10 reps Stride Stance Weight Shift - 3 x daily - 7 x weekly - 3 sets - 10 reps  Patient Education Scar Massage

## 2020-01-18 NOTE — Therapy (Signed)
[facility] [address] [place], [place], [address] Phone: [phone]   Fax:  [phone]  Physical Therapy Evaluation  Patient Details  Name: Karen Gonzales MRN: [id number] Date of Birth: 05-22-1946 Referring Provider (PT): Flossie Dibble, MD   Encounter Date: 01/18/2020  PT End of Session - 01/18/20 1223    Visit Number  1    Date for PT Re-Evaluation  03/14/20    Progress Note Due on Visit  10    PT Start Time  1147    PT Stop Time  1223    PT Time Calculation (min)  36 min    Activity Tolerance  Patient tolerated treatment well    Behavior During Therapy  [facility] for tasks assessed/performed       Past Medical History:  Diagnosis Date  . Anti-cardiolipin antibody syndrome ([place]) 06/25/2011  . Arthritis   . Blood transfusion    had when knee was replaced  . Bronchitis    history of  . Chills   . Complication of anesthesia   . Cough   . Diabetes mellitus   . DVT (deep venous thrombosis) ([place]) 06/25/2011  . Fatty liver disease, nonalcoholic    determinde by Ultrasound 11/11  . GERD (gastroesophageal reflux disease)   . H/O vitamin D deficiency   . Heart murmur   . Heart murmur    Benign  . Hiatal hernia   . Hyperlipidemia   . Hypertension   . Hyponatremia    with diuretic use  . Osteopenia   . PONV (postoperative nausea and vomiting)   . Vertigo     Past Surgical History:  Procedure Laterality Date  . ABDOMINAL HYSTERECTOMY    . ACHILLES TENDON SURGERY     bilateral   . CARPAL TUNNEL RELEASE     bilateral   . CHOLECYSTECTOMY  1969   open  . CRANIOTOMY  07/09/2011   Procedure: CRANIOTOMY HEMATOMA EVACUATION SUBDURAL;  Surgeon: Cooper Render Pool;  Location: [facility];  Service: Neurosurgery;  Laterality: Right;  Excision of Skull Lesion-Right Right Parietal Crani w/ Resection of Skull Tumor (1 1/2 hours - to follow)  . DILATION AND CURETTAGE OF UTERUS    . JOINT REPLACEMENT  2004   left knee  replacement   . KNEE SURGERY     total of 6 surgeries on both knees   . TUBAL LIGATION    . TUMOR REMOVAL     on pelvic bone     There were no vitals filed for this visit.   Subjective Assessment - 01/18/20 1153    Subjective  Pt presents to PT s/p Lt peroneal tendon repair due to rupture.  Injury was early May and surgery was performed 12/23/19.  Pt is wearing CAM boot for all waking hours.    Pertinent History  bil Achilles tendon repair surgery- old, Rt TKA, diabetes    Limitations  Standing;Walking    How long can you stand comfortably?  Rt>Lt weightbearing    How long can you walk comfortably?  short distances with crutches    Patient Stated Goals  wean from boot, return to walking normally, improve Lt strength and ROM    Currently in Pain?  Yes    Pain Score  4     Pain Location  Ankle    Pain Orientation  Left    Pain Descriptors / Indicators  Burning    Pain Type  Surgical pain  Pain Onset  More than a month ago    Pain Frequency  Intermittent    Aggravating Factors   wight weightbearing, walking too long    Pain Relieving Factors  sitting, ice, elevation         [facility] PT Assessment - 01/18/20 0001      Assessment   Medical Diagnosis  s/p Lt peroneal tendon repair    Referring Provider (PT)  Flossie Dibble, MD    Onset Date/Surgical Date  12/23/19   surgery.  Injury 1 month prior to surgery   Prior Therapy  none      Precautions   Precaution Comments  wean of boot by 6 weeks post op (02/03/20)      Restrictions   Weight Bearing Restrictions  No      Balance Screen   Has the patient fallen in the past 6 months  No    Has the patient had a decrease in activity level because of a fear of falling?   No    Is the patient reluctant to leave their home because of a fear of falling?   No      Home Film/video editor residence    Living Arrangements  Spouse/significant other    Home Access  Stairs to enter    Entrance Stairs-Number of Steps   3    Entrance Stairs-Rails  Can reach both    Bruni  One level      Prior Function   Level of Independence  Independent    Vocation  Retired    Leisure  walk      Cognition   Overall Cognitive Status  Within Functional Limits for tasks assessed      Observation/Other Assessments   Focus on Therapeutic Outcomes (FOTO)   44% limitation      Observation/Other Assessments-Edema    Edema  --   no edema     Posture/Postural Control   Posture/Postural Control  No significant limitations      ROM / Strength   AROM / PROM / Strength  AROM;PROM;Strength      AROM   Overall AROM   Deficits    Overall AROM Comments  Rt ankle A/ROM is full     AROM Assessment Site  Ankle    Right/Left Ankle  Left    Left Ankle Dorsiflexion  -5    Left Ankle Plantar Flexion  40    Left Ankle Inversion  30    Left Ankle Eversion  0      PROM   Overall PROM   Deficits    PROM Assessment Site  Ankle    Right/Left Ankle  Left    Left Ankle Dorsiflexion  0    Left Ankle Plantar Flexion  45    Left Ankle Inversion  30    Left Ankle Eversion  5      Strength   Overall Strength  Deficits    Overall Strength Comments  Lt ankle DF 4+/5, inversion 4+/5. Eversion not tested due to post-op      Palpation   Palpation comment  healing surgical incision over Lt lateral ankle.  Minor scabbing present.  Reduced mobility.       Transfers   Transfers  Sit to Stand;Stand to Sit    Sit to Stand  With armrests    Stand to Sit  With armrests  Objective measurements completed on examination: See above findings.      [facility] Adult PT Treatment/Exercise - 01/18/20 0001      Ambulation/Gait   Ambulation/Gait  Yes    Ambulation/Gait Assistance  6: Modified independent (Device/Increase time)    Assistive device  Crutches    Gait Pattern  Step-to pattern;Decreased stance time - left;Lateral trunk lean to right    Gait Comments  gait training with 1 crutch on the Rt              PT Education - 01/18/20 1217    Education Details  Access Code: [email or web address], scar massage, gait with 1 crutch    Person(s) Educated  Patient    Methods  Explanation;Demonstration;Handout    Comprehension  Verbalized understanding;Returned demonstration       PT Short Term Goals - 01/18/20 1145      PT SHORT TERM GOAL #1   Title  be indepdnenent in initial HEP    Time  4    Period  Weeks    Status  New    Target Date  02/15/20      PT SHORT TERM GOAL #2   Title  demonstrate full weight bearing in CAM boot without use of crutches for all distances    Time  4    Period  Weeks    Status  New    Target Date  02/15/20      PT SHORT TERM GOAL #3   Title  demonstrate Lt ankle eversion to > or = to 15 degrees to improve mobility    Time  4    Period  Weeks    Status  New    Target Date  02/15/20      PT SHORT TERM GOAL #4   Title  improve strength and endurance to allow for walking 30 minutes in the community without significant fatigue    Time  4    Period  Weeks    Status  New    Target Date  02/15/20        PT Long Term Goals - 01/18/20 1145      PT LONG TERM GOAL #1   Title  be independent in advanced HEP    Baseline  --    Time  8    Period  Weeks    Status  New    Target Date  03/14/20      PT LONG TERM GOAL #2   Title  reduce FOTO to < or = to 36% limitation    Baseline  --    Time  8    Period  Weeks    Status  New    Target Date  03/14/20      PT LONG TERM GOAL #3   Title  demonstrate symmetry on level surfaces without use of device    Baseline  --    Time  8    Period  Weeks    Status  New    Target Date  03/14/20      PT LONG TERM GOAL #4   Title  demonstrate Rt=Lt ankle A/ROM to improve stability and mobility on unlevel surfaces and steps    Baseline  --    Time  8    Period  Weeks    Status  New    Target Date  03/14/20      PT LONG TERM GOAL #5   Title  negotiate steps with step-over-step  gait with use of 1 rail    Time   8    Period  Weeks    Status  New    Target Date  03/14/20             Plan - 01/18/20 1326    Clinical Impression Statement  Pt presents to PT s/p Lt peroneal tendon repair due to injury sustained in early May.  Pt has been in CAM boot since surgery and is working to improve weightbearing in the boot.  MD would like pt to wean from the boot by 6 weeks post-op (02/03/20).  Pt ambulates with bil crutches and reduced WB on the Lt LE with step through.  Pt with reduced Lt ankle A/ROM and strength consistent with post-op surgical repair.  Scar is healing and no edema present today. Pt will benefit from skilled  PT to improve weight bearing, strength, flexibility, gait and proprioception s/p surgery.    Personal Factors and Comorbidities  Comorbidity 2    Comorbidities  bil achilles repair, knee replacement    Examination-Activity Limitations  Locomotion Level;Bathing;Carry;Squat;Stairs;Stand    Examination-Participation Restrictions  Church;Cleaning;Community Activity;Meal Prep    Stability/Clinical Decision Making  Stable/Uncomplicated    Clinical Decision Making  Moderate    Rehab Potential  Excellent    PT Frequency  2x / week    PT Duration  8 weeks    PT Treatment/Interventions  ADLs/Self Care Home Management;Cryotherapy;Electrical Stimulation;Gait training;Stair training;Functional mobility training;Therapeutic activities;Therapeutic exercise;Neuromuscular re-education;Balance training;Patient/family education;Manual techniques;Passive range of motion;Taping;Vasopneumatic Device    PT Next Visit Plan  work on weightbearing, gentle Lt aknle ROM into PF/DF and eversion, toe flexion, bike or NuStep, hip and knee strength, gait training    PT Home Exercise Plan  Access Code: [email or web address]    Consulted and Agree with Plan of Care  Patient       Patient will benefit from skilled therapeutic intervention in order to improve the following deficits and impairments:  Abnormal gait, Decreased  activity tolerance, Decreased balance, Decreased strength, Impaired flexibility, Decreased scar mobility, Pain, Decreased endurance, Difficulty walking, Decreased range of motion  Visit Diagnosis: Pain in left ankle and joints of left foot - Plan: PT plan of care cert/re-cert  Muscle weakness (generalized) - Plan: PT plan of care cert/re-cert  Localized edema - Plan: PT plan of care cert/re-cert  Other abnormalities of gait and mobility - Plan: PT plan of care cert/re-cert  Stiffness of left ankle, not elsewhere classified - Plan: PT plan of care cert/re-cert     Problem List Patient Active Problem List   Diagnosis Date Noted  . Chronic venous insufficiency 10/09/2019  . Varicose veins of left lower extremity with pain 08/31/2019  . Obesity (BMI 30-39.9) 08/31/2019  . Heart murmur 10/24/2017  . Physical exam 10/10/2016  . Gastroparesis due to DM ([place]) 06/05/2016  . Right shoulder pain 02/03/2016  . Type 2 diabetes mellitus with diabetic neuropathy, without long-term current use of insulin ([place]) 11/07/2015  . Hyperlipidemia 11/07/2015  . Skull mass 07/02/2011  . Fibromyalgia 06/25/2011  . Essential hypertension 09/24/2007    Sigurd Sos, PT 01/18/20 1:31 PM  [facility] [address] [place], [place], [address] Phone: [phone]   Fax:  [phone]  Name: Karen Gonzales MRN: [id number] Date of Birth: Apr 06, 1946

## 2020-01-19 ENCOUNTER — Other Ambulatory Visit: Payer: Self-pay | Admitting: Family Medicine

## 2020-01-21 ENCOUNTER — Encounter: Payer: PPO | Admitting: Physical Therapy

## 2020-01-23 ENCOUNTER — Other Ambulatory Visit: Payer: Self-pay | Admitting: Family Medicine

## 2020-01-25 ENCOUNTER — Ambulatory Visit: Payer: PPO

## 2020-01-25 ENCOUNTER — Other Ambulatory Visit: Payer: Self-pay

## 2020-01-25 DIAGNOSIS — M25572 Pain in left ankle and joints of left foot: Secondary | ICD-10-CM | POA: Diagnosis not present

## 2020-01-25 DIAGNOSIS — M6281 Muscle weakness (generalized): Secondary | ICD-10-CM

## 2020-01-25 DIAGNOSIS — R600 Localized edema: Secondary | ICD-10-CM

## 2020-01-25 DIAGNOSIS — R2689 Other abnormalities of gait and mobility: Secondary | ICD-10-CM

## 2020-01-25 DIAGNOSIS — M25672 Stiffness of left ankle, not elsewhere classified: Secondary | ICD-10-CM

## 2020-01-25 NOTE — Telephone Encounter (Signed)
Please advise is pt suppose to be on 50m or 534m

## 2020-01-25 NOTE — Therapy (Signed)
[facility] [address], [place], [place], [address] Phone: [phone]   Fax:  [phone]  Physical Therapy Treatment  Patient Details  Name: Karen Gonzales MRN: [id number] Date of Birth: 1946-01-03 Referring Provider (PT): Flossie Dibble, MD   Encounter Date: 01/25/2020   PT End of Session - 01/25/20 1015    Visit Number 2    Date for PT Re-Evaluation 03/14/20    Authorization Type Heathteam    PT Start Time 0934    PT Stop Time 1015    PT Time Calculation (min) 41 min    Activity Tolerance Patient tolerated treatment well    Behavior During Therapy [facility] for tasks assessed/performed           Past Medical History:  Diagnosis Date  . Anti-cardiolipin antibody syndrome ([place]) 06/25/2011  . Arthritis   . Blood transfusion    had when knee was replaced  . Bronchitis    history of  . Chills   . Complication of anesthesia   . Cough   . Diabetes mellitus   . DVT (deep venous thrombosis) ([place]) 06/25/2011  . Fatty liver disease, nonalcoholic    determinde by Ultrasound 11/11  . GERD (gastroesophageal reflux disease)   . H/O vitamin D deficiency   . Heart murmur   . Heart murmur    Benign  . Hiatal hernia   . Hyperlipidemia   . Hypertension   . Hyponatremia    with diuretic use  . Osteopenia   . PONV (postoperative nausea and vomiting)   . Vertigo     Past Surgical History:  Procedure Laterality Date  . ABDOMINAL HYSTERECTOMY    . ACHILLES TENDON SURGERY     bilateral   . CARPAL TUNNEL RELEASE     bilateral   . CHOLECYSTECTOMY  1969   open  . CRANIOTOMY  07/09/2011   Procedure: CRANIOTOMY HEMATOMA EVACUATION SUBDURAL;  Surgeon: Cooper Render Pool;  Location: [place] [facility];  Service: Neurosurgery;  Laterality: Right;  Excision of Skull Lesion-Right Right Parietal Crani w/ Resection of Skull Tumor (1 1/2 hours - to follow)  . DILATION AND CURETTAGE OF UTERUS    . JOINT REPLACEMENT  2004   left knee  replacement   . KNEE SURGERY     total of 6 surgeries on both knees   . TUBAL LIGATION    . TUMOR REMOVAL     on pelvic bone     There were no vitals filed for this visit.   Subjective Assessment - 01/25/20 0936    Subjective I'm not using my crutches now.  I'm working on putting more weight through my Lt leg.    Currently in Pain? Yes    Pain Score 0-No pain    Pain Location Ankle                             [facility] Adult PT Treatment/Exercise - 01/25/20 0001      Exercises   Exercises Ankle;Knee/Hip      Knee/Hip Exercises: Aerobic   Nustep Level 2x 6 minutes - legs only      Knee/Hip Exercises: Seated   Long Arc Quad Strengthening;Left;2 sets;10 reps      Knee/Hip Exercises: Supine   Straight Leg Raises Strengthening;Left;2 sets;10 reps      Knee/Hip Exercises: Sidelying   Clams 2x10 on Lt      Manual Therapy  Manual Therapy Soft tissue mobilization;Passive ROM    Manual therapy comments gentle scar mobs, passive range of motion into all directions, peroneal mobilization proximally      Ankle Exercises: Stretches   Gastroc Stretch 3 reps;20 seconds    Gastroc Stretch Limitations using green strap      Ankle Exercises: Standing   Other Standing Ankle Exercises weight shifting medial/lateral, anterior/posterior x 2 minutes each      Ankle Exercises: Seated   Other Seated Ankle Exercises rocker board: PF/DF x3 minutes       Ankle Exercises: Supine   Other Supine Ankle Exercises PF/DF active to end range of motion x20                  PT Education - 01/25/20 0959    Education Details Access Code: [email or web address]    Person(s) Educated Patient    Methods Explanation;Demonstration;Handout    Comprehension Verbalized understanding;Returned demonstration            PT Short Term Goals - 01/18/20 1145      PT SHORT TERM GOAL #1   Title be indepdnenent in initial HEP    Time 4    Period Weeks    Status New    Target Date 02/15/20       PT SHORT TERM GOAL #2   Title demonstrate full weight bearing in CAM boot without use of crutches for all distances    Time 4    Period Weeks    Status New    Target Date 02/15/20      PT SHORT TERM GOAL #3   Title demonstrate Lt ankle eversion to > or = to 15 degrees to improve mobility    Time 4    Period Weeks    Status New    Target Date 02/15/20      PT SHORT TERM GOAL #4   Title improve strength and endurance to allow for walking 30 minutes in the community without significant fatigue    Time 4    Period Weeks    Status New    Target Date 02/15/20             PT Long Term Goals - 01/18/20 1145      PT LONG TERM GOAL #1   Title be independent in advanced HEP    Baseline --    Time 8    Period Weeks    Status New    Target Date 03/14/20      PT LONG TERM GOAL #2   Title reduce FOTO to < or = to 36% limitation    Baseline --    Time 8    Period Weeks    Status New    Target Date 03/14/20      PT LONG TERM GOAL #3   Title demonstrate symmetry on level surfaces without use of device    Baseline --    Time 8    Period Weeks    Status New    Target Date 03/14/20      PT LONG TERM GOAL #4   Title demonstrate Rt=Lt ankle A/ROM to improve stability and mobility on unlevel surfaces and steps    Baseline --    Time 8    Period Weeks    Status New    Target Date 03/14/20      PT LONG TERM GOAL #5   Title negotiate steps with step-over-step gait with use of 1 rail  Time 8    Period Weeks    Status New    Target Date 03/14/20                 Plan - 01/25/20 0941    Clinical Impression Statement Pt with first time follow-up after evaluation.  Pt arrived today using a single point cane and demonstrates improved symmetry with gait on level surface and improved technique with weight shifting.  Pt is independent in current HEP for weight shifting and A/ROM.   Surgical incision is healing and demonstrates improved scar mobility.  Pt will continue to  benefit from skilled PT to address Lt ankle flexibility, strength, edema management and gait.    Rehab Potential Excellent    PT Frequency 2x / week    PT Duration 8 weeks    PT Treatment/Interventions ADLs/Self Care Home Management;Cryotherapy;Electrical Stimulation;Gait training;Stair training;Functional mobility training;Therapeutic activities;Therapeutic exercise;Neuromuscular re-education;Balance training;Patient/family education;Manual techniques;Passive range of motion;Taping;Vasopneumatic Device    PT Next Visit Plan work on weightbearing, gentle Lt aknle ROM into PF/DF and inversion, toe flexion, bike or NuStep, hip and knee strength, gait training    PT Home Exercise Plan Access Code: [email or web address]    Recommended Other Services initial cert is signed           Patient will benefit from skilled therapeutic intervention in order to improve the following deficits and impairments:  Abnormal gait, Decreased activity tolerance, Decreased balance, Decreased strength, Impaired flexibility, Decreased scar mobility, Pain, Decreased endurance, Difficulty walking, Decreased range of motion  Visit Diagnosis: Muscle weakness (generalized)  Pain in left ankle and joints of left foot  Localized edema  Other abnormalities of gait and mobility  Stiffness of left ankle, not elsewhere classified     Problem List Patient Active Problem List   Diagnosis Date Noted  . Chronic venous insufficiency 10/09/2019  . Varicose veins of left lower extremity with pain 08/31/2019  . Obesity (BMI 30-39.9) 08/31/2019  . Heart murmur 10/24/2017  . Physical exam 10/10/2016  . Gastroparesis due to DM ([place]) 06/05/2016  . Right shoulder pain 02/03/2016  . Type 2 diabetes mellitus with diabetic neuropathy, without long-term current use of insulin ([place]) 11/07/2015  . Hyperlipidemia 11/07/2015  . Skull mass 07/02/2011  . Fibromyalgia 06/25/2011  . Essential hypertension 09/24/2007     Sigurd Sos,  PT 01/25/20 10:17 AM  [facility] [address], [place], [place], [address] Phone: [phone]   Fax:  [phone]  Name: Karen Gonzales MRN: [id number] Date of Birth: 17-Aug-1945

## 2020-01-25 NOTE — Patient Instructions (Signed)
Access Code: [email or web address] URL: [email or web address] Date: 01/25/2020 Prepared by: Claiborne Billings  Exercises  Seated Long Arc Quad - 2-3 x daily - 7 x weekly - 2 sets - 10 reps - 5 hold Active Straight Leg Raise with Quad Set - 2 x daily - 7 x weekly - 2 sets - 10 reps Clamshell - 2 x daily - 7 x weekly - 2 sets - 10 reps  Patient Education Scar Massage Walking with One Crutch

## 2020-01-27 ENCOUNTER — Ambulatory Visit: Payer: PPO | Admitting: Physical Therapy

## 2020-01-27 ENCOUNTER — Other Ambulatory Visit: Payer: Self-pay

## 2020-01-27 ENCOUNTER — Encounter: Payer: Self-pay | Admitting: Physical Therapy

## 2020-01-27 DIAGNOSIS — M25672 Stiffness of left ankle, not elsewhere classified: Secondary | ICD-10-CM

## 2020-01-27 DIAGNOSIS — R2689 Other abnormalities of gait and mobility: Secondary | ICD-10-CM

## 2020-01-27 DIAGNOSIS — M6281 Muscle weakness (generalized): Secondary | ICD-10-CM

## 2020-01-27 DIAGNOSIS — R600 Localized edema: Secondary | ICD-10-CM

## 2020-01-27 DIAGNOSIS — M25572 Pain in left ankle and joints of left foot: Secondary | ICD-10-CM | POA: Diagnosis not present

## 2020-01-27 NOTE — Therapy (Signed)
[facility] [address] [place], [place], [address] Phone: [phone]   Fax:  [phone]  Physical Therapy Treatment  Patient Details  Name: Karen Gonzales MRN: [id number] Date of Birth: 29-Jan-1946 Referring Provider (PT): Flossie Dibble, MD   Encounter Date: 01/27/2020   PT End of Session - 01/27/20 0930    Visit Number 3    Date for PT Re-Evaluation 03/14/20    Authorization Type Heathteam    Progress Note Due on Visit 10    PT Start Time 0927    PT Stop Time 1011    PT Time Calculation (min) 44 min    Activity Tolerance Patient tolerated treatment well    Behavior During Therapy [facility] for tasks assessed/performed           Past Medical History:  Diagnosis Date  . Anti-cardiolipin antibody syndrome ([place]) 06/25/2011  . Arthritis   . Blood transfusion    had when knee was replaced  . Bronchitis    history of  . Chills   . Complication of anesthesia   . Cough   . Diabetes mellitus   . DVT (deep venous thrombosis) ([place]) 06/25/2011  . Fatty liver disease, nonalcoholic    determinde by Ultrasound 11/11  . GERD (gastroesophageal reflux disease)   . H/O vitamin D deficiency   . Heart murmur   . Heart murmur    Benign  . Hiatal hernia   . Hyperlipidemia   . Hypertension   . Hyponatremia    with diuretic use  . Osteopenia   . PONV (postoperative nausea and vomiting)   . Vertigo     Past Surgical History:  Procedure Laterality Date  . ABDOMINAL HYSTERECTOMY    . ACHILLES TENDON SURGERY     bilateral   . CARPAL TUNNEL RELEASE     bilateral   . CHOLECYSTECTOMY  1969   open  . CRANIOTOMY  07/09/2011   Procedure: CRANIOTOMY HEMATOMA EVACUATION SUBDURAL;  Surgeon: Cooper Render Pool;  Location: [place] [facility];  Service: Neurosurgery;  Laterality: Right;  Excision of Skull Lesion-Right Right Parietal Crani w/ Resection of Skull Tumor (1 1/2 hours - to follow)  . DILATION AND CURETTAGE OF UTERUS    . JOINT  REPLACEMENT  2004   left knee replacement   . KNEE SURGERY     total of 6 surgeries on both knees   . TUBAL LIGATION    . TUMOR REMOVAL     on pelvic bone     There were no vitals filed for this visit.   Subjective Assessment - 01/27/20 0932    Subjective My leg muscles were sore after last visit. i also did a lot of walking yesterday.    Pertinent History bil Achilles tendon repair surgery- old, Rt TKA, diabetes    Patient Stated Goals wean from boot, return to walking normally, improve Lt strength and ROM    Currently in Pain? Yes    Pain Score 2     Pain Location Ankle    Pain Orientation Left    Pain Descriptors / Indicators Sore    Aggravating Factors  walking a lot    Pain Relieving Factors ice, rest    Multiple Pain Sites No                             [facility] Adult PT Treatment/Exercise - 01/27/20 0001  Knee/Hip Exercises: Aerobic   Nustep L2 x 8 min with review of current status      Knee/Hip Exercises: Seated   Long Arc Quad Strengthening;Left;2 sets;10 reps    Long Arc Quad Weight 2 lbs.      Knee/Hip Exercises: Supine   Straight Leg Raises Strengthening;Left;2 sets;10 reps    Straight Leg Raises Limitations 0# then 1# on second set      Knee/Hip Exercises: Sidelying   Clams 2x10 on Lt   red band addded     Manual Therapy   Manual Therapy Soft tissue mobilization;Passive ROM    Manual therapy comments gentle scar mobs, passive range of motion into all directions, peroneal mobilization proximally    Passive ROM Toes, ankle       Ankle Exercises: Stretches   Gastroc Stretch 3 reps;20 seconds    Gastroc Stretch Limitations used sheet      Ankle Exercises: Seated   Other Seated Ankle Exercises rocker board: PF/DF x3 minutes       Ankle Exercises: Standing   Heel Raises Left;10 reps    Other Standing Ankle Exercises weight shifting medial/lateral, anterior/posterior x 2 minutes each      Ankle Exercises: Supine   Other Supine Ankle  Exercises PF/DF/Inv/Ev active to end range of motion x20                    PT Short Term Goals - 01/18/20 1145      PT SHORT TERM GOAL #1   Title be indepdnenent in initial HEP    Time 4    Period Weeks    Status New    Target Date 02/15/20      PT SHORT TERM GOAL #2   Title demonstrate full weight bearing in CAM boot without use of crutches for all distances    Time 4    Period Weeks    Status New    Target Date 02/15/20      PT SHORT TERM GOAL #3   Title demonstrate Lt ankle eversion to > or = to 15 degrees to improve mobility    Time 4    Period Weeks    Status New    Target Date 02/15/20      PT SHORT TERM GOAL #4   Title improve strength and endurance to allow for walking 30 minutes in the community without significant fatigue    Time 4    Period Weeks    Status New    Target Date 02/15/20             PT Long Term Goals - 01/18/20 1145      PT LONG TERM GOAL #1   Title be independent in advanced HEP    Baseline --    Time 8    Period Weeks    Status New    Target Date 03/14/20      PT LONG TERM GOAL #2   Title reduce FOTO to < or = to 36% limitation    Baseline --    Time 8    Period Weeks    Status New    Target Date 03/14/20      PT LONG TERM GOAL #3   Title demonstrate symmetry on level surfaces without use of device    Baseline --    Time 8    Period Weeks    Status New    Target Date 03/14/20      PT  LONG TERM GOAL #4   Title demonstrate Rt=Lt ankle A/ROM to improve stability and mobility on unlevel surfaces and steps    Baseline --    Time 8    Period Weeks    Status New    Target Date 03/14/20      PT LONG TERM GOAL #5   Title negotiate steps with step-over-step gait with use of 1 rail    Time 8    Period Weeks    Status New    Target Date 03/14/20                 Plan - 01/27/20 0930    Clinical Impression Statement Pt remains in boot and may wean out of it 6/22. PTA requested pt bring her shoe next  visit to work on her gait and other weightbearig exercises. Incision continues to improve and pt had no pain with exercises. Pt ambulating with CAm boot and SPC.    Personal Factors and Comorbidities Comorbidity 2    Comorbidities bil achilles repair, knee replacement    Examination-Activity Limitations Locomotion Level;Bathing;Carry;Squat;Stairs;Stand    Examination-Participation Restrictions Church;Cleaning;Community Activity;Meal Prep    Stability/Clinical Decision Making Stable/Uncomplicated    Rehab Potential Excellent    PT Frequency 2x / week    PT Duration 8 weeks    PT Treatment/Interventions ADLs/Self Care Home Management;Cryotherapy;Electrical Stimulation;Gait training;Stair training;Functional mobility training;Therapeutic activities;Therapeutic exercise;Neuromuscular re-education;Balance training;Patient/family education;Manual techniques;Passive range of motion;Taping;Vasopneumatic Device    PT Next Visit Plan Pt should bring shoe to wean out of CAM boot. MD wants pt out of boot by 6/22.    PT Home Exercise Plan Access Code: [email or web address]    Consulted and Agree with Plan of Care Patient           Patient will benefit from skilled therapeutic intervention in order to improve the following deficits and impairments:  Abnormal gait, Decreased activity tolerance, Decreased balance, Decreased strength, Impaired flexibility, Decreased scar mobility, Pain, Decreased endurance, Difficulty walking, Decreased range of motion  Visit Diagnosis: Muscle weakness (generalized)  Pain in left ankle and joints of left foot  Localized edema  Other abnormalities of gait and mobility  Stiffness of left ankle, not elsewhere classified     Problem List Patient Active Problem List   Diagnosis Date Noted  . Chronic venous insufficiency 10/09/2019  . Varicose veins of left lower extremity with pain 08/31/2019  . Obesity (BMI 30-39.9) 08/31/2019  . Heart murmur 10/24/2017  . Physical exam  10/10/2016  . Gastroparesis due to DM ([place]) 06/05/2016  . Right shoulder pain 02/03/2016  . Type 2 diabetes mellitus with diabetic neuropathy, without long-term current use of insulin ([place]) 11/07/2015  . Hyperlipidemia 11/07/2015  . Skull mass 07/02/2011  . Fibromyalgia 06/25/2011  . Essential hypertension 09/24/2007    Jermie Hippe, PTA 01/27/2020, 10:14 AM  [facility] [address] [place], [place], [address] Phone: [phone]   Fax:  [phone]  Name: TOSHIKA PARROW MRN: [id number] Date of Birth: August 05, 1946

## 2020-01-28 ENCOUNTER — Other Ambulatory Visit: Payer: Self-pay

## 2020-01-28 ENCOUNTER — Ambulatory Visit: Payer: PPO

## 2020-01-28 DIAGNOSIS — K3184 Gastroparesis: Secondary | ICD-10-CM

## 2020-01-28 DIAGNOSIS — E1140 Type 2 diabetes mellitus with diabetic neuropathy, unspecified: Secondary | ICD-10-CM

## 2020-01-28 NOTE — Progress Notes (Signed)
Chronic Care Management Pharmacy  Name: ADRINNE SZE  MRN: [id number] DOB: 1946-05-13  Chief Complaint/ HPI Karen Gonzales,  74 y.o. , female presents for their Follow-Up CCM visit with the clinical pharmacist via telephone due to COVID-19 Pandemic. Recovering from recent surgery. Currently doing PT twice a week. Started to cut a lot of sweets/sugar out of diet before surgery. States this has become a habit now and she is optimistic that the next a1c will look good. Reports unsuccessful trial in omeprazole dose decrease from 40 mg to 20 mg over last month.   PCP : [name], MD  Their chronic conditions include: HTN, HLD, type 2 DM, obesity, GERD.  Office Visits:  4/19 (PCP): upcoming ankle surgery, will get DEXA after. Increase exercise after surgery. Eye and foot exam up to date.   1/18 (PCP): restarted Jardiance. Consult Visit:  2/26 (Vasc Surg): c/o burning pain and occasional sharp shooting pain accompanied by erythema and areas of varicose veins. Prescribed knee-high compression to be worn daily. Elevate legs, use NSAIDs for discomfort  Outpatient Encounter Medications as of 01/28/2020  Medication Sig  . amLODipine (NORVASC) 5 MG tablet TAKE 1 TABLET BY MOUTH EVERY DAY  . Blood Glucose Monitoring Suppl (ONE TOUCH ULTRA 2) w/Device KIT Pt uses glucometer to check sugars twice daily. Dx E11.9  . cetirizine (ZYRTEC) 10 MG tablet TAKE 1 TABLET BY MOUTH EVERY DAY  . ferrous sulfate 325 (65 FE) MG tablet TAKE 1 TABLET BY MOUTH EVERY DAY WITH BREAKFAST. PLEASE SCHEDULE FOLLOW UP FOR DIABETES  . fluticasone (FLONASE) 50 MCG/ACT nasal spray Place 2 sprays into both nostrils daily.  Marland Kitchen gabapentin (NEURONTIN) 300 MG capsule TAKE 2 CAPSULES (600 MG TOTAL) BY MOUTH 3 (THREE) TIMES DAILY.  Marland Kitchen glipiZIDE (GLUCOTROL XL) 10 MG 24 hr tablet TAKE 1 TABLET (10 MG TOTAL) BY MOUTH DAILY WITH BREAKFAST.  Marland Kitchen guaiFENesin-codeine (ROBITUSSIN AC) 100-10 MG/5ML syrup Take 10 mLs by mouth 3  (three) times daily as needed for cough.  Marland Kitchen JARDIANCE 10 MG TABS tablet TAKE 1 TABLET BY MOUTH EVERY DAY  . Lancets (ONETOUCH DELICA PLUS [id number]) MISC USE WHEN CHECKING BLOOD SUGAR AS DIRECTED  . losartan (COZAAR) 100 MG tablet TAKE 1 TABLET (100 MG TOTAL) BY MOUTH DAILY.  Marland Kitchen losartan (COZAAR) 50 MG tablet TAKE 2 TABLETS BY MOUTH EVERY DAY  . MELATONIN PO Take by mouth.  . meloxicam (MOBIC) 15 MG tablet   . metFORMIN (GLUCOPHAGE) 500 MG tablet TAKE 2 TABLETS (1,000 MG TOTAL) BY MOUTH 2 (TWO) TIMES DAILY WITH A MEAL.  Marland Kitchen omeprazole (PRILOSEC) 40 MG capsule Take 1 capsule (40 mg total) by mouth 2 (two) times daily. (Patient taking differently: Take 40 mg by mouth daily. )  . ONETOUCH ULTRA test strip CHECK BLOOD SUGAR 1-2 TIMES DAILY  . polyethylene glycol-electrolytes (NULYTELY) 420 g solution TAKE AS DIRECTED BY MOUTH ONCE FOR 1 DOSE  . simvastatin (ZOCOR) 10 MG tablet TAKE 1 TABLET BY MOUTH EVERYDAY AT BEDTIME   No facility-administered encounter medications on file as of 01/28/2020.    Diabetes   Recent Relevant Labs: Lab Results  Component Value Date/Time   HGBA1C 8.3 (H) 11/30/2019 09:49 AM   HGBA1C 8.3 (H) 08/31/2019 10:11 AM   MICROALBUR 24.8 11/04/2014 12:00 AM   A1c ranges 7.4-9.4% over the last 3 years. Most recent 8.3% (11/30/2019). Currently on three agents, has been taking Jardiance - only while outside of coverage gap. Patient assistance had not been initiated after last  visit.   Checking BG: Daily. Fasting examples over last several days 143 130 132. Denies any lows recently. States lowest reading over last two weeks in 120s.  Patient is currently uncontrolled on the following medications:   Glipizide XL 10 mg daily  Jardiance 10 mg daily  Metformin 1000 mg twice daily   Plan  Continue current medications.  Patient to complete patient assistance application.  Labs already scheduled 02/2020.  GERD / gastroparesis   Previously suffered from EPS while taking  metoclopramide. After last visit, had cut down omeprazole from 40 mg to 20 mg x ~1 month still has ongoing reflux/belching. Is going to resume 40 mg daily.  Plan   Continue current medications.   Vaccines   Immunization History  Administered Date(s) Administered  . Fluad Quad(high Dose 65+) 05/28/2019  . Influenza, High Dose Seasonal PF 04/28/2018  . Influenza,inj,Quad PF,6+ Mos 06/14/2015, 05/14/2016, 04/08/2017  . Pneumococcal Conjugate-13 10/23/2013  . Pneumococcal Polysaccharide-23 11/04/2014  . Tdap 03/13/2011   Discussed Shingles vaccine at last visit, got a quote from pharmacy and plans to get once she has recovered from surgery.   Plan  Recommended patient receive Shingrix vaccine in pharmacy.   Medication Management   Pt uses [facility] for all medications. No issues with current medication management.   Plan  Continue current medication management strategy. ______________ Visit Information  Follow up: 3 month phone visit  DM, PAP, Exercise    Madelin Rear, Pharm.D. Clinical Pharmacist [facility] [phone]

## 2020-01-28 NOTE — Patient Instructions (Addendum)
Please let me know if you have any questions on the patient assistance application. Once completed please drop off at the front desk here.  Keep up the good working limiting your sugars!  Call me at [phone] (direct line) with any questions - thank you!  Madelin Rear, Pharm.D., BCGP Clinical Pharmacist [facility] at [facility] [phone]  Diabetes Mellitus and Nutrition, Adult When you have diabetes (diabetes mellitus), it is very important to have healthy eating habits because your blood sugar (glucose) levels are greatly affected by what you eat and drink. Eating healthy foods in the appropriate amounts, at about the same times every day, can help you:  Control your blood glucose.  Lower your risk of heart disease.  Improve your blood pressure.  Reach or maintain a healthy weight. Every person with diabetes is different, and each person has different needs for a meal plan. Your health care provider may recommend that you work with a diet and nutrition specialist (dietitian) to make a meal plan that is best for you. Your meal plan may vary depending on factors such as:  The calories you need.  The medicines you take.  Your weight.  Your blood glucose, blood pressure, and cholesterol levels.  Your activity level.  Other health conditions you have, such as heart or kidney disease. How do carbohydrates affect me? Carbohydrates, also called carbs, affect your blood glucose level more than any other type of food. Eating carbs naturally raises the amount of glucose in your blood. Carb counting is a method for keeping track of how many carbs you eat. Counting carbs is important to keep your blood glucose at a healthy level, especially if you use insulin or take certain oral diabetes medicines. It is important to know how many carbs you can safely have in each meal. This is different for every person. Your dietitian can help you calculate how many carbs you should  have at each meal and for each snack. Foods that contain carbs include:  Bread, cereal, rice, pasta, and crackers.  Potatoes and corn.  Peas, beans, and lentils.  Milk and yogurt.  Fruit and juice.  Desserts, such as cakes, cookies, ice cream, and candy. How does alcohol affect me? Alcohol can cause a sudden decrease in blood glucose (hypoglycemia), especially if you use insulin or take certain oral diabetes medicines. Hypoglycemia can be a life-threatening condition. Symptoms of hypoglycemia (sleepiness, dizziness, and confusion) are similar to symptoms of having too much alcohol. If your health care provider says that alcohol is safe for you, follow these guidelines:  Limit alcohol intake to no more than 1 drink per day for nonpregnant women and 2 drinks per day for men. One drink equals 12 oz of beer, 5 oz of wine, or 1 oz of hard liquor.  Do not drink on an empty stomach.  Keep yourself hydrated with water, diet soda, or unsweetened iced tea.  Keep in mind that regular soda, juice, and other mixers may contain a lot of sugar and must be counted as carbs. What are tips for following this plan?  Reading food labels  Start by checking the serving size on the "Nutrition Facts" label of packaged foods and drinks. The amount of calories, carbs, fats, and other nutrients listed on the label is based on one serving of the item. Many items contain more than one serving per package.  Check the total grams (g) of carbs in one serving. You can calculate the number of servings of  carbs in one serving by dividing the total carbs by 15. For example, if a food has 30 g of total carbs, it would be equal to 2 servings of carbs.  Check the number of grams (g) of saturated and trans fats in one serving. Choose foods that have low or no amount of these fats.  Check the number of milligrams (mg) of salt (sodium) in one serving. Most people should limit total sodium intake to less than 2,300 mg per  day.  Always check the nutrition information of foods labeled as "low-fat" or "nonfat". These foods may be higher in added sugar or refined carbs and should be avoided.  Talk to your dietitian to identify your daily goals for nutrients listed on the label. Shopping  Avoid buying canned, premade, or processed foods. These foods tend to be high in fat, sodium, and added sugar.  Shop around the outside edge of the grocery store. This includes fresh fruits and vegetables, bulk grains, fresh meats, and fresh dairy. Cooking  Use low-heat cooking methods, such as baking, instead of high-heat cooking methods like deep frying.  Cook using healthy oils, such as olive, canola, or sunflower oil.  Avoid cooking with butter, cream, or high-fat meats. Meal planning  Eat meals and snacks regularly, preferably at the same times every day. Avoid going long periods of time without eating.  Eat foods high in fiber, such as fresh fruits, vegetables, beans, and whole grains. Talk to your dietitian about how many servings of carbs you can eat at each meal.  Eat 4-6 ounces (oz) of lean protein each day, such as lean meat, chicken, fish, eggs, or tofu. One oz of lean protein is equal to: ? 1 oz of meat, chicken, or fish. ? 1 egg. ?  cup of tofu.  Eat some foods each day that contain healthy fats, such as avocado, nuts, seeds, and fish. Lifestyle  Check your blood glucose regularly.  Exercise regularly as told by your health care provider. This may include: ? 150 minutes of moderate-intensity or vigorous-intensity exercise each week. This could be brisk walking, biking, or water aerobics. ? Stretching and doing strength exercises, such as yoga or weightlifting, at least 2 times a week.  Take medicines as told by your health care provider.  Do not use any products that contain nicotine or tobacco, such as cigarettes and e-cigarettes. If you need help quitting, ask your health care provider.  Work with  a Social worker or diabetes educator to identify strategies to manage stress and any emotional and social challenges. Questions to ask a health care provider  Do I need to meet with a diabetes educator?  Do I need to meet with a dietitian?  What number can I call if I have questions?  When are the best times to check my blood glucose? Where to find more information:  American Diabetes Association: diabetes.org  Academy of Nutrition and Dietetics: www.eatright.[employer] of Diabetes and Digestive and Kidney Diseases (NIH): [employer] Summary  A healthy meal plan will help you control your blood glucose and maintain a healthy lifestyle.  Working with a diet and nutrition specialist (dietitian) can help you make a meal plan that is best for you.  Keep in mind that carbohydrates (carbs) and alcohol have immediate effects on your blood glucose levels. It is important to count carbs and to use alcohol carefully. This information is not intended to replace advice given to you by your health care provider. Make sure you  discuss any questions you have with your health care provider. Document Revised: 07/12/2017 Document Reviewed: 09/03/2016 Elsevier Patient Education  2020 [employer].

## 2020-02-02 ENCOUNTER — Ambulatory Visit: Payer: PPO

## 2020-02-02 ENCOUNTER — Other Ambulatory Visit: Payer: Self-pay

## 2020-02-02 DIAGNOSIS — M25572 Pain in left ankle and joints of left foot: Secondary | ICD-10-CM

## 2020-02-02 DIAGNOSIS — R2689 Other abnormalities of gait and mobility: Secondary | ICD-10-CM

## 2020-02-02 DIAGNOSIS — M25672 Stiffness of left ankle, not elsewhere classified: Secondary | ICD-10-CM

## 2020-02-02 DIAGNOSIS — R600 Localized edema: Secondary | ICD-10-CM

## 2020-02-02 DIAGNOSIS — M6281 Muscle weakness (generalized): Secondary | ICD-10-CM

## 2020-02-02 NOTE — Therapy (Signed)
[facility] [address], [place], [place], [address] Phone: [phone]   Fax:  [phone]  Physical Therapy Evaluation  Patient Details  Name: Karen Gonzales MRN: [id number] Date of Birth: [birth year] Referring Provider (PT): Flossie Dibble, MD   Encounter Date: 02/02/2020   PT End of Session - 02/02/20 0927    Visit Number 4    Date for PT Re-Evaluation 03/14/20    Authorization Type [employer]    Progress Note Due on Visit 10    PT Start Time 0845    PT Stop Time 0925    PT Time Calculation (min) 40 min    Activity Tolerance Patient tolerated treatment well    Behavior During Therapy [facility] for tasks assessed/performed           Past Medical History:  Diagnosis Date  . Anti-cardiolipin antibody syndrome ([place]) 06/25/2011  . Arthritis   . Blood transfusion    had when knee was replaced  . Bronchitis    history of  . Chills   . Complication of anesthesia   . Cough   . Diabetes mellitus   . DVT (deep venous thrombosis) ([place]) 06/25/2011  . Fatty liver disease, nonalcoholic    determinde by Ultrasound 11/11  . GERD (gastroesophageal reflux disease)   . H/O vitamin D deficiency   . Heart murmur   . Heart murmur    Benign  . Hiatal hernia   . Hyperlipidemia   . Hypertension   . Hyponatremia    with diuretic use  . Osteopenia   . PONV (postoperative nausea and vomiting)   . Vertigo     Past Surgical History:  Procedure Laterality Date  . ABDOMINAL HYSTERECTOMY    . ACHILLES TENDON SURGERY     bilateral   . CARPAL TUNNEL RELEASE     bilateral   . CHOLECYSTECTOMY  1969   open  . CRANIOTOMY  07/09/2011   Procedure: CRANIOTOMY HEMATOMA EVACUATION SUBDURAL;  Surgeon: Cooper Render Pool;  Location: [facility];  Service: Neurosurgery;  Laterality: Right;  Excision of Skull Lesion-Right Right Parietal Crani w/ Resection of Skull Tumor (1 1/2 hours - to follow)  . DILATION AND CURETTAGE OF UTERUS    .  JOINT REPLACEMENT  2004   left knee replacement   . KNEE SURGERY     total of 6 surgeries on both knees   . TUBAL LIGATION    . TUMOR REMOVAL     on pelvic bone     There were no vitals filed for this visit.    Subjective Assessment - 02/02/20 0844    Subjective My legs are a little sore today.    Currently in Pain? No/denies                          Objective measurements completed on examination: See above findings.       [facility] Adult PT Treatment/Exercise - 02/02/20 0001      Ambulation/Gait   Ambulation/Gait Yes    Ambulation/Gait Assistance 6: Modified independent (Device/Increase time)    Gait Comments gait training with standard cane on the Rt.  Verbal cues for heel strike and symmetry.        Knee/Hip Exercises: Aerobic   Nustep L2 x 8 min with review of current status      Knee/Hip Exercises: Standing   Other Standing Knee Exercises tandem stance: 2x20 seconds Rt and Lt each  Knee/Hip Exercises: Seated   Long Arc Quad Strengthening;Left;2 sets;10 reps    Long Arc Quad Weight 2 lbs.    Sit to Sand 2 sets;10 reps      Ankle Exercises: Standing   SLS 10 second hold x 5 reps     Rocker Board 3 minutes    Rebounder weight shift 3 ways x 1 minute each    Heel Raises Both;20 reps                    PT Short Term Goals - 02/02/20 0854      PT SHORT TERM GOAL #1   Title be indepdnenent in initial HEP    Status Achieved      PT SHORT TERM GOAL #2   Title demonstrate full weight bearing in CAM boot without use of crutches for all distances    Status Achieved      PT SHORT TERM GOAL #4   Title improve strength and endurance to allow for walking 30 minutes in the community without significant fatigue    Baseline 20-25 minutes with fatigue    Time 4    Period Weeks    Status On-going             PT Long Term Goals - 01/18/20 1145      PT LONG TERM GOAL #1   Title be independent in advanced HEP    Baseline --    Time 8     Period Weeks    Status New    Target Date 03/14/20      PT LONG TERM GOAL #2   Title reduce FOTO to < or = to 36% limitation    Baseline --    Time 8    Period Weeks    Status New    Target Date 03/14/20      PT LONG TERM GOAL #3   Title demonstrate symmetry on level surfaces without use of device    Baseline --    Time 8    Period Weeks    Status New    Target Date 03/14/20      PT LONG TERM GOAL #4   Title demonstrate Rt=Lt ankle A/ROM to improve stability and mobility on unlevel surfaces and steps    Baseline --    Time 8    Period Weeks    Status New    Target Date 03/14/20      PT LONG TERM GOAL #5   Title negotiate steps with step-over-step gait with use of 1 rail    Time 8    Period Weeks    Status New    Target Date 03/14/20                  Plan - 02/02/20 0904    Clinical Impression Statement Pt is making steady progress s/p Lt ankle surgery.  Pt is working to wean from the boot as MD wanted her to wean from it by 02/03/20.  Pt performed all exercise out of the boot today.  Pt ambulates with a standard cane with mild asymmetry with not wearing the boot.  Pt is limited to walking 20-25 minutes due to fatigue.  Pt requires supervision and verbal cues for technique with exercise.  Pt will continue to benefit from skilled PT to improve Lt LE strength and stability s/p Lt ankle tendon repair.    PT Frequency 2x / week    PT Duration 8  weeks    PT Treatment/Interventions ADLs/Self Care Home Management;Cryotherapy;Electrical Stimulation;Gait training;Stair training;Functional mobility training;Therapeutic activities;Therapeutic exercise;Neuromuscular re-education;Balance training;Patient/family education;Manual techniques;Passive range of motion;Taping;Vasopneumatic Device    PT Next Visit Plan continue to work on LT ankle strength and gait/balance out of the boot    PT Home Exercise Plan Access Code: [email or web address]    Consulted and Agree with Plan of Care Patient            Patient will benefit from skilled therapeutic intervention in order to improve the following deficits and impairments:  Abnormal gait, Decreased activity tolerance, Decreased balance, Decreased strength, Impaired flexibility, Decreased scar mobility, Pain, Decreased endurance, Difficulty walking, Decreased range of motion  Visit Diagnosis: Muscle weakness (generalized)  Pain in left ankle and joints of left foot  Localized edema  Other abnormalities of gait and mobility  Stiffness of left ankle, not elsewhere classified     Problem List Patient Active Problem List   Diagnosis Date Noted  . Chronic venous insufficiency 10/09/2019  . Varicose veins of left lower extremity with pain 08/31/2019  . Obesity (BMI 30-39.9) 08/31/2019  . Heart murmur 10/24/2017  . Physical exam 10/10/2016  . Gastroparesis due to DM ([place]) 06/05/2016  . Right shoulder pain 02/03/2016  . Type 2 diabetes mellitus with diabetic neuropathy, without long-term current use of insulin ([place]) 11/07/2015  . Hyperlipidemia 11/07/2015  . Skull mass 07/02/2011  . Fibromyalgia 06/25/2011  . Essential hypertension 09/24/2007    Sigurd Sos, PT 02/02/20 9:32 AM  [facility] [address] [place], [place], [address] Phone: [phone]   Fax:  [phone]  Name: AVANTI JETTER MRN: [id number] Date of Birth: September 14, 1945

## 2020-02-04 ENCOUNTER — Telehealth: Payer: PPO

## 2020-02-04 ENCOUNTER — Ambulatory Visit: Payer: PPO | Admitting: Physical Therapy

## 2020-02-04 ENCOUNTER — Other Ambulatory Visit: Payer: Self-pay | Admitting: Family Medicine

## 2020-02-04 ENCOUNTER — Other Ambulatory Visit: Payer: Self-pay

## 2020-02-04 DIAGNOSIS — R2689 Other abnormalities of gait and mobility: Secondary | ICD-10-CM

## 2020-02-04 DIAGNOSIS — R600 Localized edema: Secondary | ICD-10-CM

## 2020-02-04 DIAGNOSIS — M25672 Stiffness of left ankle, not elsewhere classified: Secondary | ICD-10-CM

## 2020-02-04 DIAGNOSIS — M6281 Muscle weakness (generalized): Secondary | ICD-10-CM

## 2020-02-04 DIAGNOSIS — M25572 Pain in left ankle and joints of left foot: Secondary | ICD-10-CM

## 2020-02-04 NOTE — Therapy (Signed)
[facility] [address], [place], [place], [address] Phone: [phone]   Fax:  [phone]  Physical Therapy Treatment  Patient Details  Name: Karen Gonzales MRN: [id number] Date of Birth: 28-Mar-1946 Referring Provider (PT): Flossie Dibble, MD   Encounter Date: 02/04/2020   PT End of Session - 02/04/20 1407    Visit Number 5    Date for PT Re-Evaluation 03/14/20    Authorization Type [employer]    Progress Note Due on Visit 10    PT Start Time 1400    PT Stop Time 1440    PT Time Calculation (min) 40 min    Activity Tolerance Patient tolerated treatment well           Past Medical History:  Diagnosis Date  . Anti-cardiolipin antibody syndrome ([place]) 06/25/2011  . Arthritis   . Blood transfusion    had when knee was replaced  . Bronchitis    history of  . Chills   . Complication of anesthesia   . Cough   . Diabetes mellitus   . DVT (deep venous thrombosis) ([place]) 06/25/2011  . Fatty liver disease, nonalcoholic    determinde by Ultrasound 11/11  . GERD (gastroesophageal reflux disease)   . H/O vitamin D deficiency   . Heart murmur   . Heart murmur    Benign  . Hiatal hernia   . Hyperlipidemia   . Hypertension   . Hyponatremia    with diuretic use  . Osteopenia   . PONV (postoperative nausea and vomiting)   . Vertigo     Past Surgical History:  Procedure Laterality Date  . ABDOMINAL HYSTERECTOMY    . ACHILLES TENDON SURGERY     bilateral   . CARPAL TUNNEL RELEASE     bilateral   . CHOLECYSTECTOMY  1969   open  . CRANIOTOMY  07/09/2011   Procedure: CRANIOTOMY HEMATOMA EVACUATION SUBDURAL;  Surgeon: Cooper Render Pool;  Location: [facility];  Service: Neurosurgery;  Laterality: Right;  Excision of Skull Lesion-Right Right Parietal Crani w/ Resection of Skull Tumor (1 1/2 hours - to follow)  . DILATION AND CURETTAGE OF UTERUS    . JOINT REPLACEMENT  2004   left knee replacement   . KNEE SURGERY      total of 6 surgeries on both knees   . TUBAL LIGATION    . TUMOR REMOVAL     on pelvic bone     There were no vitals filed for this visit.   Subjective Assessment - 02/04/20 1400    Subjective Presents without CAM boot today.  Only uses cane first thing in the morning only.    I haven't used my boot at all since I was here last.  My ankle swells and stings some.    Pertinent History bil Achilles tendon repair surgery- old, Rt TKA, diabetes;  MD 7/1    Patient Stated Goals wean from boot, return to walking normally, improve Lt strength and ROM    Currently in Pain? No/denies    Pain Score 0-No pain    Pain Location Ankle    Pain Orientation Left                             [facility] Adult PT Treatment/Exercise - 02/04/20 0001      Therapeutic Activites    Therapeutic Activities ADL's    ADL's up and down steps reciprocally; walking,  standing, sit to stand, reaching       Knee/Hip Exercises: Stretches   Piriformis Stretch Right;Left;1 rep;30 seconds    Piriformis Stretch Limitations seated       Knee/Hip Exercises: Aerobic   Nustep L2 x 9 min with review of current status   LEs only      Knee/Hip Exercises: Standing   Forward Step Up Left;1 set;10 reps;Hand Hold: 0;Step Height: 6"    SLS kickstand position with UE punches and reaches     SLS with Vectors 3 ways 10x     Gait Training kickstand position with red band UE pulses back and forward 15x each     Other Standing Knee Exercises windmill cone touch with alternating hands 12x WB on left       Knee/Hip Exercises: Seated   Sit to Sand 15 reps      Ankle Exercises: Standing   Rocker Board 2 minutes    Rebounder weight shift 3 ways x 1 minute each    Heel Raises 15 reps    Other Standing Ankle Exercises WB on left with 3 step touches single hand support 1 minute                     PT Short Term Goals - 02/02/20 0854      PT SHORT TERM GOAL #1   Title be indepdnenent in initial HEP     Status Achieved      PT SHORT TERM GOAL #2   Title demonstrate full weight bearing in CAM boot without use of crutches for all distances    Status Achieved      PT SHORT TERM GOAL #4   Title improve strength and endurance to allow for walking 30 minutes in the community without significant fatigue    Baseline 20-25 minutes with fatigue    Time 4    Period Weeks    Status On-going             PT Long Term Goals - 01/18/20 1145      PT LONG TERM GOAL #1   Title be independent in advanced HEP    Baseline --    Time 8    Period Weeks    Status New    Target Date 03/14/20      PT LONG TERM GOAL #2   Title reduce FOTO to < or = to 36% limitation    Baseline --    Time 8    Period Weeks    Status New    Target Date 03/14/20      PT LONG TERM GOAL #3   Title demonstrate symmetry on level surfaces without use of device    Baseline --    Time 8    Period Weeks    Status New    Target Date 03/14/20      PT LONG TERM GOAL #4   Title demonstrate Rt=Lt ankle A/ROM to improve stability and mobility on unlevel surfaces and steps    Baseline --    Time 8    Period Weeks    Status New    Target Date 03/14/20      PT LONG TERM GOAL #5   Title negotiate steps with step-over-step gait with use of 1 rail    Time 8    Period Weeks    Status New    Target Date 03/14/20  Plan - 02/04/20 1408    Clinical Impression Statement The patient continues to make steady improvements in ROM, strength and proprioception.  Some lateral hip discomfort noted at times which patient relates to wearing the walking boot for so long.  She is able to perform a progression of exercise intensity/challenge without pain or excessive fatigue.  Therapist monitoring response with all interventions.    Comorbidities bil achilles repair, knee replacement    Rehab Potential Excellent    PT Frequency 2x / week    PT Duration 8 weeks    PT Treatment/Interventions ADLs/Self Care Home  Management;Cryotherapy;Electrical Stimulation;Gait training;Stair training;Functional mobility training;Therapeutic activities;Therapeutic exercise;Neuromuscular re-education;Balance training;Patient/family education;Manual techniques;Passive range of motion;Taping;Vasopneumatic Device    PT Next Visit Plan recheck ROM, strength, progress toward goals and FOTO prior to MD appt 7/1; continue to work on LT ankle strength and gait/balance out of the [place] Code: [id number]           Patient will benefit from skilled therapeutic intervention in order to improve the following deficits and impairments:  Abnormal gait, Decreased activity tolerance, Decreased balance, Decreased strength, Impaired flexibility, Decreased scar mobility, Pain, Decreased endurance, Difficulty walking, Decreased range of motion  Visit Diagnosis: Muscle weakness (generalized)  Pain in left ankle and joints of left foot  Localized edema  Other abnormalities of gait and mobility  Stiffness of left ankle, not elsewhere classified     Problem List Patient Active Problem List   Diagnosis Date Noted  . Chronic venous insufficiency 10/09/2019  . Varicose veins of left lower extremity with pain 08/31/2019  . Obesity (BMI 30-39.9) 08/31/2019  . Heart murmur 10/24/2017  . Physical exam 10/10/2016  . Gastroparesis due to DM ([place]) 06/05/2016  . Right shoulder pain 02/03/2016  . Type 2 diabetes mellitus with diabetic neuropathy, without long-term current use of insulin ([place]) 11/07/2015  . Hyperlipidemia 11/07/2015  . Skull mass 07/02/2011  . Fibromyalgia 06/25/2011  . Essential hypertension 09/24/2007   Ruben Im, PT 02/04/20 2:43 PM Phone: [phone] Fax: [phone] Alvera Singh 02/04/2020, 2:43 PM  [facility] [address] [place], [place], [address] Phone: [phone]   Fax:  [phone]  Name: Karen Gonzales MRN: [id number] Date of Birth: 06-24-1946

## 2020-02-04 NOTE — Telephone Encounter (Signed)
Please advise if pt should be on 52m or 548m

## 2020-02-10 ENCOUNTER — Ambulatory Visit: Payer: PPO

## 2020-02-10 ENCOUNTER — Other Ambulatory Visit: Payer: Self-pay

## 2020-02-10 DIAGNOSIS — M25672 Stiffness of left ankle, not elsewhere classified: Secondary | ICD-10-CM

## 2020-02-10 DIAGNOSIS — M6281 Muscle weakness (generalized): Secondary | ICD-10-CM

## 2020-02-10 DIAGNOSIS — M25572 Pain in left ankle and joints of left foot: Secondary | ICD-10-CM | POA: Diagnosis not present

## 2020-02-10 DIAGNOSIS — R600 Localized edema: Secondary | ICD-10-CM

## 2020-02-10 DIAGNOSIS — R2689 Other abnormalities of gait and mobility: Secondary | ICD-10-CM

## 2020-02-10 NOTE — Therapy (Addendum)
[facility] [address], [place], [place], [address] Phone: [phone]   Fax:  [phone]  Physical Therapy Treatment  Patient Details  Name: Karen Gonzales MRN: [id number] Date of Birth: [birth year] Referring Provider (PT): Flossie Dibble, MD   Encounter Date: 02/10/2020   PT End of Session - 02/10/20 1230    Visit Number 6    Date for PT Re-Evaluation 03/14/20    Authorization Type [employer]    Progress Note Due on Visit 10    PT Start Time 8185    PT Stop Time 1224    PT Time Calculation (min) 39 min    Activity Tolerance Patient tolerated treatment well    Behavior During Therapy [facility] for tasks assessed/performed           Past Medical History:  Diagnosis Date  . Anti-cardiolipin antibody syndrome ([place]) 06/25/2011  . Arthritis   . Blood transfusion    had when knee was replaced  . Bronchitis    history of  . Chills   . Complication of anesthesia   . Cough   . Diabetes mellitus   . DVT (deep venous thrombosis) ([place]) 06/25/2011  . Fatty liver disease, nonalcoholic    determinde by Ultrasound 11/11  . GERD (gastroesophageal reflux disease)   . H/O vitamin D deficiency   . Heart murmur   . Heart murmur    Benign  . Hiatal hernia   . Hyperlipidemia   . Hypertension   . Hyponatremia    with diuretic use  . Osteopenia   . PONV (postoperative nausea and vomiting)   . Vertigo     Past Surgical History:  Procedure Laterality Date  . ABDOMINAL HYSTERECTOMY    . ACHILLES TENDON SURGERY     bilateral   . CARPAL TUNNEL RELEASE     bilateral   . CHOLECYSTECTOMY  1969   open  . CRANIOTOMY  07/09/2011   Procedure: CRANIOTOMY HEMATOMA EVACUATION SUBDURAL;  Surgeon: Cooper Render Pool;  Location: [facility];  Service: Neurosurgery;  Laterality: Right;  Excision of Skull Lesion-Right Right Parietal Crani w/ Resection of Skull Tumor (1 1/2 hours - to follow)  . DILATION AND CURETTAGE OF UTERUS    . JOINT  REPLACEMENT  2004   left knee replacement   . KNEE SURGERY     total of 6 surgeries on both knees   . TUBAL LIGATION    . TUMOR REMOVAL     on pelvic bone     There were no vitals filed for this visit.   Subjective Assessment - 02/10/20 1148    Subjective I'm doing good in my shoes.  I see the MD tomorrow.    Currently in Pain? No/denies              [facility] PT Assessment - 02/10/20 0001      Assessment   Medical Diagnosis s/p Lt peroneal tendon repair    Referring Provider (PT) Flossie Dibble, MD    Onset Date/Surgical Date 12/23/19      Strength   Overall Strength Deficits    Strength Assessment Site Ankle    Right/Left Ankle Left    Left Ankle Dorsiflexion 4+/5    Left Ankle Inversion 4+/5      Ambulation/Gait   Ambulation/Gait Yes    Ambulation/Gait Assistance 7: Independent    Gait Pattern Step-through pattern;Decreased stance time - left  [facility] PT Treatment/Exercise - 02/10/20 0001      Knee/Hip Exercises: Aerobic   Nustep L2 x 8 min with review of current status   LEs only      Knee/Hip Exercises: Standing   Heel Raises 2 sets;10 reps    Forward Step Up Left;10 reps;Hand Hold: 0;Step Height: 6";2 sets    Step Down 2 sets;10 reps;Hand Hold: 1;Step Height: 6"    Step Down Limitations verbal and demo cues for control and alignment    SLS on blue pod 3x20 seconds      Knee/Hip Exercises: Seated   Sit to Sand 15 reps      Ankle Exercises: Seated   BAPS Level 2;10 reps   4 directions with intermitent assist by PT on board                   PT Short Term Goals - 02/02/20 0854      PT SHORT TERM GOAL #1   Title be indepdnenent in initial HEP    Status Achieved      PT SHORT TERM GOAL #2   Title demonstrate full weight bearing in CAM boot without use of crutches for all distances    Status Achieved      PT SHORT TERM GOAL #4   Title improve strength and endurance to allow for walking 30 minutes in  the community without significant fatigue    Baseline 20-25 minutes with fatigue    Time 4    Period Weeks    Status On-going             PT Long Term Goals - 02/10/20 1153      PT LONG TERM GOAL #1   Title be independent in advanced HEP    Time 8    Period Weeks    Status On-going      PT LONG TERM GOAL #3   Title demonstrate symmetry on level surfaces without use of device    Baseline mild assymmetry with reduce time on Lt on level surface.  No device.    Time 8    Period Weeks    Status On-going      PT LONG TERM GOAL #4   Title demonstrate Rt=Lt ankle A/ROM to improve stability and mobility on unlevel surfaces and steps    Status Achieved      PT LONG TERM GOAL #5   Title negotiate steps with step-over-step gait with use of 1 rail    Baseline able to do this, demonstrates reduced eccentric control with descending steps.    Time 8    Period Weeks    Status On-going                 Plan - 02/10/20 1228    Clinical Impression Statement The patient continues to make steady improvements in ROM, strength and proprioception.  Pt is now ambulating without device or boot.  Pt demonstrates mild antalgia on level surface with reduced time in stance on the Lt. Pt is challenged with eccentric control with descending steps. Verbal and demo cues were provided for hip alignment with exercise.   She is able to perform a progression of exercise intensity/challenge without pain or excessive fatigue.  Therapist monitoring response with all interventions.  Pt will see MD and decide if she will return to PT after.    Rehab Potential Excellent    PT Frequency 2x / week    PT Duration 8 weeks  PT Treatment/Interventions ADLs/Self Care Home Management;Cryotherapy;Electrical Stimulation;Gait training;Stair training;Functional mobility training;Therapeutic activities;Therapeutic exercise;Neuromuscular re-education;Balance training;Patient/family education;Manual techniques;Passive range  of motion;Taping;Vasopneumatic Device    PT Next Visit Plan see what MD says.  Continue to work on gait, proprioception and strength if pt returns.    PT Home Exercise Plan Access Code: [email or web address]           Patient will benefit from skilled therapeutic intervention in order to improve the following deficits and impairments:  Abnormal gait, Decreased activity tolerance, Decreased balance, Decreased strength, Impaired flexibility, Decreased scar mobility, Pain, Decreased endurance, Difficulty walking, Decreased range of motion  Visit Diagnosis: Muscle weakness (generalized)  Pain in left ankle and joints of left foot  Localized edema  Other abnormalities of gait and mobility  Stiffness of left ankle, not elsewhere classified     Problem List Patient Active Problem List   Diagnosis Date Noted  . Chronic venous insufficiency 10/09/2019  . Varicose veins of left lower extremity with pain 08/31/2019  . Obesity (BMI 30-39.9) 08/31/2019  . Heart murmur 10/24/2017  . Physical exam 10/10/2016  . Gastroparesis due to DM ([place]) 06/05/2016  . Right shoulder pain 02/03/2016  . Type 2 diabetes mellitus with diabetic neuropathy, without long-term current use of insulin ([place]) 11/07/2015  . Hyperlipidemia 11/07/2015  . Skull mass 07/02/2011  . Fibromyalgia 06/25/2011  . Essential hypertension 09/24/2007     Sigurd Sos, PT 02/10/20 12:35 PM PHYSICAL THERAPY DISCHARGE SUMMARY  Visits from Start of Care: 6  Current functional level related to goals / functional outcomes: See above for current status.  Pt saw MD and was released from PT.    Remaining deficits: See above.  Pt will continue to work on proprioception, strength and flexibility to improve gait and function s/p surgery.     Education / Equipment: HEP Plan: Patient agrees to discharge.  Patient goals were partially met. Patient is being discharged due to being pleased with the current functional level.  ?????         Sigurd Sos, PT 02/16/20 7:14 AM  [facility] [address] [place], [place], [address] Phone: [phone]   Fax:  [phone]  Name: Karen Gonzales MRN: [id number] Date of Birth: [birth year]

## 2020-02-16 ENCOUNTER — Encounter: Payer: PPO | Admitting: Physical Therapy

## 2020-02-18 ENCOUNTER — Encounter: Payer: PPO | Admitting: Physical Therapy

## 2020-03-02 ENCOUNTER — Other Ambulatory Visit: Payer: Self-pay

## 2020-03-02 ENCOUNTER — Ambulatory Visit (INDEPENDENT_AMBULATORY_CARE_PROVIDER_SITE_OTHER): Payer: PPO | Admitting: Family Medicine

## 2020-03-02 ENCOUNTER — Encounter: Payer: Self-pay | Admitting: Family Medicine

## 2020-03-02 VITALS — BP 123/82 | HR 89 | Temp 98.4°F | Resp 16 | Ht 65.0 in | Wt 186.5 lb

## 2020-03-02 DIAGNOSIS — K089 Disorder of teeth and supporting structures, unspecified: Secondary | ICD-10-CM | POA: Insufficient documentation

## 2020-03-02 DIAGNOSIS — E669 Obesity, unspecified: Secondary | ICD-10-CM | POA: Diagnosis not present

## 2020-03-02 DIAGNOSIS — E1140 Type 2 diabetes mellitus with diabetic neuropathy, unspecified: Secondary | ICD-10-CM | POA: Diagnosis not present

## 2020-03-02 LAB — BASIC METABOLIC PANEL
BUN: 20 mg/dL (ref 6–23)
CO2: 24 mEq/L (ref 19–32)
Calcium: 9.6 mg/dL (ref 8.4–10.5)
Chloride: 101 mEq/L (ref 96–112)
Creatinine, Ser: 0.79 mg/dL (ref 0.40–1.20)
GFR: 71.39 mL/min (ref >60.00)
Glucose, Bld: 113 mg/dL — ABNORMAL HIGH (ref 70–99)
Potassium: 4.5 mEq/L (ref 3.5–5.1)
Sodium: 134 mEq/L — ABNORMAL LOW (ref 135–145)

## 2020-03-02 NOTE — Assessment & Plan Note (Signed)
Pt has 2 broken teeth/crowns that need to be repaired.  She is interested in having her bottom teeth pulled at that time.  This has not yet been scheduled.  Discussed that antibiotic prophylaxis in people w/ joint replacement is no longer recommended but pt indicates her dentist requires this.  She will let me know when her dental work is scheduled and with whom.

## 2020-03-02 NOTE — Assessment & Plan Note (Signed)
Ongoing issue for pt.  Last A1C 8.3.  She reports sugars have been better since she dramatically changed her diet.  UTD on eye exam, foot exam, and on ARB.  She is not currently able to afford her Jardiance b/c she entered the donut hole last week.  Check labs.  Adjust meds prn

## 2020-03-02 NOTE — Patient Instructions (Addendum)
Follow up in 3-4 months to recheck diabetes, BP, and cholesterol We'll notify you of your lab results and make any changes if needed Continue to work on healthy diet and regular exercise- you're down 6 lbs!  Doing great! Let me know when your dental work is scheduled and we can send in the prescription Call with any questions or concerns Good luck tomorrow!!!

## 2020-03-02 NOTE — Progress Notes (Signed)
   Subjective:    Patient ID: Karen Gonzales, female    DOB: [birth year], 74 y.o.   MRN: [id number]  HPI DM- chronic problem, on Glipizide XL 37m daily, Metformin 10071mBID, and Jardiance 1068mwhich she is not taking b/c she's in the donut).  UTD on eye exam, foot exam.  On ARB for renal protection.  Home CBGs have been 130s-150s.  Denies symptomatic lows w/ rare exception.  No CP, SOB, HAs, visual changes, abd pain.  + neuropathy bilaterally  Obesity- pt is down 6 lbs since last visit.  Pt reports she has decreased sugar intake considerably.  Has been active but no regular exercise  Dental work- pt is considering having her bottom teeth pulled when she has a broken crown repaired.  She takes Amoxicillin beforehand due to hx of TKR.   Review of Systems For ROS see HPI   This visit occurred during the SARS-CoV-2 public health emergency.  Safety protocols were in place, including screening questions prior to the visit, additional usage of staff PPE, and extensive cleaning of exam room while observing appropriate contact time as indicated for disinfecting solutions.       Objective:   Physical Exam Vitals reviewed.  Constitutional:      General: She is not in acute distress.    Appearance: She is well-developed.  HENT:     Head: Normocephalic and atraumatic.  Eyes:     Conjunctiva/sclera: Conjunctivae normal.     Pupils: Pupils are equal, round, and reactive to light.  Neck:     Thyroid: No thyromegaly.  Cardiovascular:     Rate and Rhythm: Normal rate and regular rhythm.     Heart sounds: Murmur (II/VI SEM) heard.   Pulmonary:     Effort: Pulmonary effort is normal. No respiratory distress.     Breath sounds: Normal breath sounds.  Abdominal:     General: There is no distension.     Palpations: Abdomen is soft.     Tenderness: There is no abdominal tenderness.  Musculoskeletal:     Cervical back: Normal range of motion and neck supple.  Lymphadenopathy:     Cervical: No  cervical adenopathy.  Skin:    General: Skin is warm and dry.  Neurological:     Mental Status: She is alert and oriented to person, place, and time.  Psychiatric:        Behavior: Behavior normal.           Assessment & Plan:

## 2020-03-02 NOTE — Assessment & Plan Note (Signed)
Ongoing issue for pt.  She is down 6 lbs since last visit by decreasing her sugar intake.  Applauded her efforts.  Will continue to follow.

## 2020-03-03 LAB — HEMOGLOBIN A1C: Hgb A1c MFr Bld: 7.3 % — ABNORMAL HIGH (ref 4.6–6.5)

## 2020-03-17 ENCOUNTER — Telehealth: Payer: Self-pay

## 2020-03-17 NOTE — Progress Notes (Addendum)
Chronic Care Management Pharmacy Assistant   Name: Karen Gonzales  MRN: [id number] DOB: 11-15-1945  Reason for Encounter: Disease State  PCP : [name], MD  Allergies:   Allergies  Allergen Reactions   Nsaids Other (See Comments)   Reglan [Metoclopramide] Other (See Comments)    Tremor    Estrogens Rash    Topical patch caused the rash    Medications: Outpatient Encounter Medications as of 03/17/2020  Medication Sig   amLODipine (NORVASC) 5 MG tablet TAKE 1 TABLET BY MOUTH EVERY DAY   Blood Glucose Monitoring Suppl (ONE TOUCH ULTRA 2) w/Device KIT Pt uses glucometer to check sugars twice daily. Dx E11.9   cetirizine (ZYRTEC) 10 MG tablet TAKE 1 TABLET BY MOUTH EVERY DAY   ferrous sulfate 325 (65 FE) MG tablet TAKE 1 TABLET BY MOUTH EVERY DAY WITH BREAKFAST. PLEASE SCHEDULE FOLLOW UP FOR DIABETES   fluticasone (FLONASE) 50 MCG/ACT nasal spray Place 2 sprays into both nostrils daily.   gabapentin (NEURONTIN) 300 MG capsule TAKE 2 CAPSULES (600 MG TOTAL) BY MOUTH 3 (THREE) TIMES DAILY.   glipiZIDE (GLUCOTROL XL) 10 MG 24 hr tablet TAKE 1 TABLET (10 MG TOTAL) BY MOUTH DAILY WITH BREAKFAST.   guaiFENesin-codeine (ROBITUSSIN AC) 100-10 MG/5ML syrup Take 10 mLs by mouth 3 (three) times daily as needed for cough.   JARDIANCE 10 MG TABS tablet TAKE 1 TABLET BY MOUTH EVERY DAY (Patient not taking: Reported on 03/02/2020)   Lancets (ONETOUCH DELICA PLUS [id number]) MISC USE WHEN CHECKING BLOOD SUGAR AS DIRECTED   losartan (COZAAR) 50 MG tablet TAKE 2 TABLETS BY MOUTH EVERY DAY   MELATONIN PO Take by mouth.   meloxicam (MOBIC) 15 MG tablet    metFORMIN (GLUCOPHAGE) 500 MG tablet TAKE 2 TABLETS (1,000 MG TOTAL) BY MOUTH 2 (TWO) TIMES DAILY WITH A MEAL.   omeprazole (PRILOSEC) 40 MG capsule Take 1 capsule (40 mg total) by mouth 2 (two) times daily. (Patient taking differently: Take 40 mg by mouth daily. )   ONETOUCH ULTRA test strip CHECK BLOOD SUGAR 1-2 TIMES DAILY    polyethylene glycol-electrolytes (NULYTELY) 420 g solution TAKE AS DIRECTED BY MOUTH ONCE FOR 1 DOSE   simvastatin (ZOCOR) 10 MG tablet TAKE 1 TABLET BY MOUTH EVERYDAY AT BEDTIME   No facility-administered encounter medications on file as of 03/17/2020.    Current Diagnosis: Patient Active Problem List   Diagnosis Date Noted   Dental disorder 03/02/2020   Chronic venous insufficiency 10/09/2019   Varicose veins of left lower extremity with pain 08/31/2019   Obesity (BMI 30-39.9) 08/31/2019   Heart murmur 10/24/2017   Physical exam 10/10/2016   Gastroparesis due to DM ([place]) 06/05/2016   Right shoulder pain 02/03/2016   Type 2 diabetes mellitus with diabetic neuropathy, without long-term current use of insulin ([place]) 11/07/2015   Hyperlipidemia 11/07/2015   Skull mass 07/02/2011   Fibromyalgia 06/25/2011   Essential hypertension 09/24/2007    Recent Relevant Labs: Lab Results  Component Value Date/Time   HGBA1C 7.3 (H) 03/02/2020 11:01 AM   HGBA1C 8.3 (H) 11/30/2019 09:49 AM   MICROALBUR 24.8 11/04/2014 12:00 AM    Kidney Function Lab Results  Component Value Date/Time   CREATININE 0.79 03/02/2020 11:01 AM   CREATININE 0.63 11/30/2019 09:49 AM   CREATININE 0.81 10/24/2017 03:59 PM   CREATININE 0.80 10/10/2016 04:20 PM   GFR 71.39 03/02/2020 11:01 AM   GFRNONAA >90 07/03/2011 10:06 AM   GFRAA >90 07/03/2011 10:06 AM  What recent interventions/DTPs have been made to improve glycemic control:  None Have there been any recent hospitalizations or ED visits since last visit with CPP? No Patient denies hypoglycemic symptoms, including None Patient denies hyperglycemic symptoms, including none How often are you checking your blood sugar? once daily What are your blood sugars ranging?  Fasting: 130s Before meals: N/A After meals: N/A Bedtime: N/A During the week, how often does your blood glucose drop below 70? Never Are you checking your feet daily/regularly?   Adherence  Review: Is the patient currently on a STATIN medication? Yes.  Is the patient currently on ACE/ARB medication? Yes Does the patient have >5 day gap between last estimated fill dates? No   [place]  Clinical Pharmacist Assistant [phone]   Follow-Up:  Pharmacist Review

## 2020-04-19 ENCOUNTER — Telehealth: Payer: Self-pay

## 2020-04-19 NOTE — Patient Instructions (Signed)
Hi Ms. [place]!  Please sign/complete the remaining pages of the application (with exception of last page) and drop off at [facility]. Let the front desk know this is for Edison Nasuti, the pharmacist. I will complete the last page with Dr. Birdie Riddle.  I am here for any questions! Please call the number below if anything comes up.  Thank you,  Madelin Rear, Pharm.D., BCGP Clinical Pharmacist [facility] [phone]

## 2020-04-19 NOTE — Progress Notes (Signed)
Pt requesting Jardiance PAP application be resent. Reprinted and mailed to pt, confirmed address with patient.

## 2020-04-25 ENCOUNTER — Telehealth: Payer: Self-pay | Admitting: Family Medicine

## 2020-04-25 NOTE — Telephone Encounter (Signed)
Paperwork given to PCP for review.

## 2020-04-25 NOTE — Telephone Encounter (Signed)
FYI

## 2020-04-25 NOTE — Telephone Encounter (Signed)
Form completed and placed in basket  

## 2020-04-25 NOTE — Telephone Encounter (Signed)
Medication assistance form placed in Dr. Virgil Benedict bin up front

## 2020-04-26 NOTE — Telephone Encounter (Signed)
Faxed to the # provided on the forms and sent to scan.

## 2020-05-11 ENCOUNTER — Telehealth: Payer: Self-pay | Admitting: Family Medicine

## 2020-05-11 NOTE — Telephone Encounter (Signed)
Patient is having dental work done next Monday 06/16/2020 - She needs for you to call in an antibiotic for her.  Please call into [facility] in [place].  She would like for you to call it in at least by Friday.

## 2020-05-12 ENCOUNTER — Telehealth: Payer: PPO

## 2020-05-12 NOTE — Telephone Encounter (Signed)
Called pt and Left a detailed message on VM to advise.

## 2020-05-12 NOTE — Telephone Encounter (Signed)
New ADA (American Dental Association) guidelines state that there is no need for abx treatment with dental procedures unless you have congenital heart disease or an artifical valve.  Joint replacements no longer qualify

## 2020-05-12 NOTE — Telephone Encounter (Signed)
Please advise 

## 2020-05-13 ENCOUNTER — Telehealth: Payer: PPO

## 2020-05-13 ENCOUNTER — Telehealth: Payer: Self-pay

## 2020-05-13 NOTE — Progress Notes (Signed)
°  Chronic Care Management   Outreach Note   Name: Karen Gonzales MRN: [id number] DOB: March 04, 1946  Referred by: [name], MD Reason for referral: Telephone Appointment with [place] Pharmacist, Madelin Rear.   Unable to reach. Telephone appointment with clinical pharmacist today (05/13/2020) at 2pm.  If patient immediately returns call, transfer to ext [phone]. Otherwise, please provide number below to patient to reschedule visit.   Madelin Rear, Pharm.D., BCGP Clinical Pharmacist [facility] [phone]

## 2020-05-14 ENCOUNTER — Other Ambulatory Visit: Payer: Self-pay

## 2020-05-14 ENCOUNTER — Ambulatory Visit (HOSPITAL_COMMUNITY)
Admission: EM | Admit: 2020-05-14 | Discharge: 2020-05-14 | Disposition: A | Discharge status: Home / Self Care | Payer: PPO | Attending: Emergency Medicine | Admitting: Emergency Medicine

## 2020-05-14 ENCOUNTER — Encounter (HOSPITAL_COMMUNITY): Payer: Self-pay | Admitting: *Deleted

## 2020-05-14 DIAGNOSIS — E119 Type 2 diabetes mellitus without complications: Secondary | ICD-10-CM | POA: Diagnosis not present

## 2020-05-14 DIAGNOSIS — Z6830 Body mass index (BMI) 30.0-30.9, adult: Secondary | ICD-10-CM | POA: Diagnosis not present

## 2020-05-14 DIAGNOSIS — Z7984 Long term (current) use of oral hypoglycemic drugs: Secondary | ICD-10-CM | POA: Diagnosis not present

## 2020-05-14 DIAGNOSIS — E669 Obesity, unspecified: Secondary | ICD-10-CM | POA: Diagnosis not present

## 2020-05-14 DIAGNOSIS — J069 Acute upper respiratory infection, unspecified: Secondary | ICD-10-CM

## 2020-05-14 DIAGNOSIS — K760 Fatty (change of) liver, not elsewhere classified: Secondary | ICD-10-CM | POA: Diagnosis not present

## 2020-05-14 DIAGNOSIS — Z86718 Personal history of other venous thrombosis and embolism: Secondary | ICD-10-CM | POA: Diagnosis not present

## 2020-05-14 DIAGNOSIS — R059 Cough, unspecified: Secondary | ICD-10-CM | POA: Diagnosis not present

## 2020-05-14 DIAGNOSIS — I872 Venous insufficiency (chronic) (peripheral): Secondary | ICD-10-CM | POA: Diagnosis not present

## 2020-05-14 DIAGNOSIS — Z20822 Contact with and (suspected) exposure to covid-19: Secondary | ICD-10-CM | POA: Diagnosis not present

## 2020-05-14 DIAGNOSIS — M1990 Unspecified osteoarthritis, unspecified site: Secondary | ICD-10-CM | POA: Diagnosis not present

## 2020-05-14 DIAGNOSIS — M8580 Other specified disorders of bone density and structure, unspecified site: Secondary | ICD-10-CM | POA: Insufficient documentation

## 2020-05-14 DIAGNOSIS — Z791 Long term (current) use of non-steroidal anti-inflammatories (NSAID): Secondary | ICD-10-CM | POA: Insufficient documentation

## 2020-05-14 DIAGNOSIS — I10 Essential (primary) hypertension: Secondary | ICD-10-CM | POA: Diagnosis not present

## 2020-05-14 DIAGNOSIS — Z7901 Long term (current) use of anticoagulants: Secondary | ICD-10-CM | POA: Insufficient documentation

## 2020-05-14 DIAGNOSIS — Z79899 Other long term (current) drug therapy: Secondary | ICD-10-CM | POA: Diagnosis not present

## 2020-05-14 DIAGNOSIS — E785 Hyperlipidemia, unspecified: Secondary | ICD-10-CM | POA: Insufficient documentation

## 2020-05-14 DIAGNOSIS — I83812 Varicose veins of left lower extremities with pain: Secondary | ICD-10-CM | POA: Insufficient documentation

## 2020-05-14 DIAGNOSIS — R011 Cardiac murmur, unspecified: Secondary | ICD-10-CM | POA: Diagnosis not present

## 2020-05-14 HISTORY — DX: COVID-19: U07.1

## 2020-05-14 LAB — SARS CORONAVIRUS 2 (TAT 6-24 HRS): SARS Coronavirus 2: NEGATIVE

## 2020-05-14 MED ORDER — BENZONATATE 200 MG PO CAPS: 200.0000 mg | ORAL_CAPSULE | Freq: Three times a day (TID) | ORAL | 0 refills | Status: AC | PRN

## 2020-05-14 MED ORDER — DM-GUAIFENESIN ER 30-600 MG PO TB12
1.0000 | ORAL_TABLET | Freq: Two times a day (BID) | ORAL | 0 refills | Status: DC
Start: 2020-05-14 — End: 2020-05-30

## 2020-05-14 NOTE — ED Provider Notes (Signed)
[place]    CSN: [id number] Arrival date & time: 05/14/20  1216      History   Chief Complaint Chief Complaint  Patient presents with   Cough    HPI Karen Gonzales is a 74 y.o. female history of DM type II presenting today for evaluation of a cough.  Patient reports that she has had nasal congestion and cough for approximately 2 days.  Denies fevers chills or body aches.  Denies sore throat.  Denies chest pain or shortness of breath.  Previously had Covid and 2020.  Denies any recent exposures.  HPI  Past Medical History:  Diagnosis Date   Anti-cardiolipin antibody syndrome ([place]) 06/25/2011   Arthritis    Blood transfusion    had when knee was replaced   Bronchitis    history of   Chills    Complication of anesthesia    Cough    COVID-19    Diabetes mellitus    DVT (deep venous thrombosis) ([place]) 06/25/2011   Fatty liver disease, nonalcoholic    determinde by Ultrasound 11/11   GERD (gastroesophageal reflux disease)    H/O vitamin D deficiency    Heart murmur    Heart murmur    Benign   Hiatal hernia    Hyperlipidemia    Hypertension    Hyponatremia    with diuretic use   Osteopenia    PONV (postoperative nausea and vomiting)    Vertigo     Patient Active Problem List   Diagnosis Date Noted   Dental disorder 03/02/2020   Chronic venous insufficiency 10/09/2019   Varicose veins of left lower extremity with pain 08/31/2019   Obesity (BMI 30-39.9) 08/31/2019   Heart murmur 10/24/2017   Physical exam 10/10/2016   Gastroparesis due to DM ([place]) 06/05/2016   Right shoulder pain 02/03/2016   Type 2 diabetes mellitus with diabetic neuropathy, without long-term current use of insulin ([place]) 11/07/2015   Hyperlipidemia 11/07/2015   Skull mass 07/02/2011   Fibromyalgia 06/25/2011   Essential hypertension 09/24/2007    Past Surgical History:  Procedure Laterality Date   ABDOMINAL HYSTERECTOMY     [facility]  TENDON SURGERY     bilateral    CARPAL TUNNEL RELEASE     bilateral    CHOLECYSTECTOMY  1969   open   CRANIOTOMY  07/09/2011   Procedure: CRANIOTOMY HEMATOMA EVACUATION SUBDURAL;  Surgeon: Cooper Render Pool;  Location: [facility];  Service: Neurosurgery;  Laterality: Right;  Excision of Skull Lesion-Right Right Parietal Crani w/ Resection of Skull Tumor (1 1/2 hours - to follow)   [place] REPLACEMENT  2004   left knee replacement    KNEE SURGERY     total of 6 surgeries on both knees    REPAIR PERONEAL TENDONS ANKLE     TUBAL LIGATION     TUMOR REMOVAL     on pelvic bone     OB History   No obstetric history on file.      Home Medications    Prior to Admission medications   Medication Sig Start Date End Date Taking? Authorizing Provider  amLODipine (NORVASC) 5 MG tablet TAKE 1 TABLET BY MOUTH EVERY DAY 01/18/20  Yes [name], MD  cetirizine (ZYRTEC) 10 MG tablet TAKE 1 TABLET BY MOUTH EVERY DAY 09/07/19  Yes [name], MD  gabapentin (NEURONTIN) 300 MG capsule TAKE 2 CAPSULES (600 MG TOTAL) BY MOUTH 3 (  THREE) TIMES DAILY. 09/29/19  Yes [name], MD  glipiZIDE (GLUCOTROL XL) 10 MG 24 hr tablet TAKE 1 TABLET (10 MG TOTAL) BY MOUTH DAILY WITH BREAKFAST. 12/08/19  Yes [name], MD  JARDIANCE 10 MG TABS tablet TAKE 1 TABLET BY MOUTH EVERY DAY 12/14/19  Yes [name], MD  losartan (COZAAR) 50 MG tablet TAKE 2 TABLETS BY MOUTH EVERY DAY 11/09/19  Yes [name], MD  MELATONIN PO Take by mouth.   Yes [provider]  meloxicam (MOBIC) 15 MG tablet  05/25/19  Yes [provider]  metFORMIN (GLUCOPHAGE) 500 MG tablet TAKE 2 TABLETS (1,000 MG TOTAL) BY MOUTH 2 (TWO) TIMES DAILY WITH A MEAL. 01/19/20  Yes [name], MD  omeprazole (PRILOSEC) 40 MG capsule Take 1 capsule (40 mg total) by mouth 2 (two) times daily. Patient taking differently: Take 40 mg by mouth daily.   06/05/16  Yes [name], MD  simvastatin (ZOCOR) 10 MG tablet TAKE 1 TABLET BY MOUTH EVERYDAY AT BEDTIME 11/27/19  Yes [name], MD  benzonatate (TESSALON) 200 MG capsule Take 1 capsule (200 mg total) by mouth 3 (three) times daily as needed for up to 7 days for cough. 05/14/20 05/21/20  Jezebel Pollet C, PA-C  Blood Glucose Monitoring Suppl (ONE TOUCH ULTRA 2) w/Device KIT Pt uses glucometer to check sugars twice daily. Dx E11.9 02/03/16   [name], MD  dextromethorphan-guaiFENesin [facility] DM) 30-600 MG 12hr tablet Take 1 tablet by mouth 2 (two) times daily. 05/14/20   Barth Trella C, PA-C  ferrous sulfate 325 (65 FE) MG tablet TAKE 1 TABLET BY MOUTH EVERY DAY WITH BREAKFAST. PLEASE SCHEDULE FOLLOW UP FOR DIABETES 07/02/18   [name], MD  fluticasone [facility]) 50 MCG/ACT nasal spray Place 2 sprays into both nostrils daily. 08/26/17   [name], MD  guaiFENesin-codeine (ROBITUSSIN AC) 100-10 MG/5ML syrup Take 10 mLs by mouth 3 (three) times daily as needed for cough. 07/16/19   [name], MD  Lancets (ONETOUCH DELICA PLUS [id number]) [place] USE WHEN CHECKING BLOOD SUGAR AS DIRECTED 04/06/19   [name], MD  [facility] ULTRA test strip CHECK BLOOD SUGAR 1-2 TIMES DAILY 04/06/19   [name], MD  polyethylene glycol-electrolytes (NULYTELY) 420 g solution TAKE AS DIRECTED BY MOUTH ONCE FOR 1 DOSE 08/24/19   [provider]    Family History Family History  Problem Relation Age of Onset   Kidney disease Mother    Osteoporosis Mother    Heart disease Father    Cancer Father        lymphoma   Osteoporosis Father    Healthy Brother    Cancer Maternal Grandmother        lymphoma   Breast cancer Maternal Grandmother    Cancer Paternal Grandmother        breast   Breast cancer Paternal Aunt    Hodgkin's lymphoma Grandchild     Social History Social History   Tobacco Use   Smoking status: Never  Smoker   Smokeless tobacco: Never Used  Scientific laboratory technician Use: Never used  Substance Use Topics   Alcohol use: No   Drug use: No     Allergies   Nsaids, Reglan [metoclopramide], and Estrogens   Review of Systems Review of Systems  Constitutional: Negative for activity change, appetite change, chills, fatigue and fever.  HENT: Positive for congestion, rhinorrhea and sinus pressure. Negative for  ear pain, sore throat and trouble swallowing.   Eyes: Negative for discharge and redness.  Respiratory: Positive for cough. Negative for chest tightness and shortness of breath.   Cardiovascular: Negative for chest pain.  Gastrointestinal: Negative for abdominal pain, diarrhea, nausea and vomiting.  Musculoskeletal: Negative for myalgias.  Skin: Negative for rash.  Neurological: Negative for dizziness, light-headedness and headaches.     Physical Exam Triage Vital Signs ED Triage Vitals  Enc Vitals Group     BP 05/14/20 1447 (!) 164/67     Pulse Rate 05/14/20 1447 85     Resp 05/14/20 1447 18     Temp 05/14/20 1447 98.6 F (37 C)     Temp Source 05/14/20 1447 Oral     SpO2 05/14/20 1447 98 %     Weight --      Height --      Head Circumference --      Peak Flow --      Pain Score 05/14/20 1448 0     Pain Loc --      Pain Edu? --      Excl. in [place]? --    No data found.  Updated Vital Signs BP (!) 164/67    Pulse 85    Temp 98.6 F (37 C) (Oral)    Resp 18    SpO2 98%   Visual Acuity Right Eye Distance:   Left Eye Distance:   Bilateral Distance:    Right Eye Near:   Left Eye Near:    Bilateral Near:     Physical Exam Vitals and nursing note reviewed.  Constitutional:      Appearance: She is well-developed.     Comments: No acute distress  HENT:     Head: Normocephalic and atraumatic.     Ears:     Comments: Bilateral ears without tenderness to palpation of external auricle, tragus and mastoid, EAC's without erythema or swelling, TM's with good bony  landmarks and cone of light. Non erythematous.     Nose: Nose normal.     Mouth/Throat:     Comments: Oral mucosa pink and moist, no tonsillar enlargement or exudate. Posterior pharynx patent and nonerythematous, no uvula deviation or swelling. Normal phonation.  Eyes:     Conjunctiva/sclera: Conjunctivae normal.  Cardiovascular:     Rate and Rhythm: Normal rate.  Pulmonary:     Effort: Pulmonary effort is normal. No respiratory distress.     Comments: Breathing comfortably at rest, CTABL, no wheezing, rales or other adventitious sounds auscultated  Occasional cough in room Abdominal:     General: There is no distension.  Musculoskeletal:        General: Normal range of motion.     Cervical back: Neck supple.  Skin:    General: Skin is warm and dry.  Neurological:     Mental Status: She is alert and oriented to person, place, and time.      UC Treatments / Results  Labs (all labs ordered are listed, but only abnormal results are displayed) Labs Reviewed  SARS CORONAVIRUS 2 (TAT 6-24 HRS)    EKG   Radiology No results found.  Procedures Procedures (including critical care time)  Medications Ordered in UC Medications - No data to display  Initial Impression / Assessment and Plan / UC Course  I have reviewed the triage vital signs and the nursing notes.  Pertinent labs & imaging results that were available during my care of the patient were reviewed  by me and considered in my medical decision making (see chart for details).     Covid test pending, URI symptoms and cough x2 days, exam reassuring, recommending symptomatic and supportive care with close monitoring.Discussed strict return precautions. Patient verbalized understanding and is agreeable with plan.  Final Clinical Impressions(s) / UC Diagnoses   Final diagnoses:  Viral URI with cough     Discharge Instructions     Covid test pending, monitor my chart for results May continue nasal  sprays Mucinex DM twice daily Tessalon as needed for cough Rest and fluids Honey and hot tea Follow-up if not improving or worsening   ED Prescriptions    Medication Sig Dispense Auth. Provider   benzonatate (TESSALON) 200 MG capsule Take 1 capsule (200 mg total) by mouth 3 (three) times daily as needed for up to 7 days for cough. 28 capsule Kainon Varady C, PA-C   dextromethorphan-guaiFENesin (MUCINEX DM) 30-600 MG 12hr tablet Take 1 tablet by mouth 2 (two) times daily. 20 tablet Laure Leone, [place] C, PA-C     PDMP not reviewed this encounter.   Janith Lima, PA-C 05/14/20 1510

## 2020-05-14 NOTE — ED Triage Notes (Signed)
C/O cough x 2 days without any known fevers.  States had Covid 06/2019 & only sx was cough.

## 2020-05-14 NOTE — Discharge Instructions (Signed)
Covid test pending, monitor my chart for results May continue nasal sprays Mucinex DM twice daily Tessalon as needed for cough Rest and fluids Honey and hot tea Follow-up if not improving or worsening

## 2020-05-20 ENCOUNTER — Telehealth: Payer: Self-pay

## 2020-05-20 NOTE — Progress Notes (Signed)
Chronic Care Management Pharmacy Assistant   Name: Karen Gonzales  MRN: [id number] DOB: April 17, 1946  Reason for Encounter: Disease State   PCP : [name], MD  Allergies:   Allergies  Allergen Reactions  . Nsaids Other (See Comments)  . Reglan [Metoclopramide] Other (See Comments)    Tremor   . Estrogens Rash    Topical patch caused the rash    Medications: Outpatient Encounter Medications as of 05/20/2020  Medication Sig  . amLODipine (NORVASC) 5 MG tablet TAKE 1 TABLET BY MOUTH EVERY DAY  . benzonatate (TESSALON) 200 MG capsule Take 1 capsule (200 mg total) by mouth 3 (three) times daily as needed for up to 7 days for cough.  . Blood Glucose Monitoring Suppl (ONE TOUCH ULTRA 2) w/Device KIT Pt uses glucometer to check sugars twice daily. Dx E11.9  . cetirizine (ZYRTEC) 10 MG tablet TAKE 1 TABLET BY MOUTH EVERY DAY  . dextromethorphan-guaiFENesin (MUCINEX DM) 30-600 MG 12hr tablet Take 1 tablet by mouth 2 (two) times daily.  . ferrous sulfate 325 (65 FE) MG tablet TAKE 1 TABLET BY MOUTH EVERY DAY WITH BREAKFAST. PLEASE SCHEDULE FOLLOW UP FOR DIABETES  . fluticasone (FLONASE) 50 MCG/ACT nasal spray Place 2 sprays into both nostrils daily.  Marland Kitchen gabapentin (NEURONTIN) 300 MG capsule TAKE 2 CAPSULES (600 MG TOTAL) BY MOUTH 3 (THREE) TIMES DAILY.  Marland Kitchen glipiZIDE (GLUCOTROL XL) 10 MG 24 hr tablet TAKE 1 TABLET (10 MG TOTAL) BY MOUTH DAILY WITH BREAKFAST.  Marland Kitchen guaiFENesin-codeine (ROBITUSSIN AC) 100-10 MG/5ML syrup Take 10 mLs by mouth 3 (three) times daily as needed for cough.  Marland Kitchen JARDIANCE 10 MG TABS tablet TAKE 1 TABLET BY MOUTH EVERY DAY  . Lancets (ONETOUCH DELICA PLUS [id number]) MISC USE WHEN CHECKING BLOOD SUGAR AS DIRECTED  . losartan (COZAAR) 50 MG tablet TAKE 2 TABLETS BY MOUTH EVERY DAY  . MELATONIN PO Take by mouth.  . meloxicam (MOBIC) 15 MG tablet   . metFORMIN (GLUCOPHAGE) 500 MG tablet TAKE 2 TABLETS (1,000 MG TOTAL) BY MOUTH 2 (TWO) TIMES DAILY WITH A MEAL.  Marland Kitchen  omeprazole (PRILOSEC) 40 MG capsule Take 1 capsule (40 mg total) by mouth 2 (two) times daily. (Patient taking differently: Take 40 mg by mouth daily. )  . ONETOUCH ULTRA test strip CHECK BLOOD SUGAR 1-2 TIMES DAILY  . polyethylene glycol-electrolytes (NULYTELY) 420 g solution TAKE AS DIRECTED BY MOUTH ONCE FOR 1 DOSE  . simvastatin (ZOCOR) 10 MG tablet TAKE 1 TABLET BY MOUTH EVERYDAY AT BEDTIME   No facility-administered encounter medications on file as of 05/20/2020.    Current Diagnosis: Patient Active Problem List   Diagnosis Date Noted  . Dental disorder 03/02/2020  . Chronic venous insufficiency 10/09/2019  . Varicose veins of left lower extremity with pain 08/31/2019  . Obesity (BMI 30-39.9) 08/31/2019  . Heart murmur 10/24/2017  . Physical exam 10/10/2016  . Gastroparesis due to DM ([place]) 06/05/2016  . Right shoulder pain 02/03/2016  . Type 2 diabetes mellitus with diabetic neuropathy, without long-term current use of insulin ([place]) 11/07/2015  . Hyperlipidemia 11/07/2015  . Skull mass 07/02/2011  . Fibromyalgia 06/25/2011  . Essential hypertension 09/24/2007   Recent Relevant Labs: Lab Results  Component Value Date/Time   HGBA1C 7.3 (H) 03/02/2020 11:01 AM   HGBA1C 8.3 (H) 11/30/2019 09:49 AM   MICROALBUR 24.8 11/04/2014 12:00 AM    Kidney Function Lab Results  Component Value Date/Time   CREATININE 0.79 03/02/2020 11:01 AM   CREATININE  0.63 11/30/2019 09:49 AM   CREATININE 0.81 10/24/2017 03:59 PM   CREATININE 0.80 10/10/2016 04:20 PM   GFR 71.39 03/02/2020 11:01 AM   GFRNONAA >90 07/03/2011 10:06 AM   GFRAA >90 07/03/2011 10:06 AM    . Current antihyperglycemic regimen:  metFORMIN (GLUCOPHAGE) 500 MG tablet . What recent interventions/DTPs have been made to improve glycemic control:  o None Noted . Have there been any recent hospitalizations or ED visits since last visit with CPP? Yes  Patient was seen in ED on 05-14-20. Patient presented to ED for evaluation  of a cough. ED recommended symptomatic and supportive care  With close monitoring.  . Patient denies hypoglycemic symptoms, including None . Patient denies hyperglycemic symptoms, including none . How often are you checking your blood sugar? once daily . What are your blood sugars ranging?  o Fasting: 143, 130s,  o Before meals: N/a o After meals: N/a o Bedtime: N/a . During the week, how often does your blood glucose drop below 70? Never . Are you checking your feet daily/regularly? Patient states she is checking feet regularly.  Adherence Review: Is the patient currently on a STATIN medication? Yes Is the patient currently on ACE/ARB medication? No Does the patient have >5 day gap between last estimated fill dates? No   Karen Gonzales  Follow-Up:  Pharmacist Review

## 2020-05-30 ENCOUNTER — Ambulatory Visit (INDEPENDENT_AMBULATORY_CARE_PROVIDER_SITE_OTHER): Payer: PPO

## 2020-05-30 VITALS — Ht 65.0 in | Wt 186.0 lb

## 2020-05-30 DIAGNOSIS — Z0000 Encounter for general adult medical examination without abnormal findings: Secondary | ICD-10-CM | POA: Diagnosis not present

## 2020-05-30 NOTE — Progress Notes (Signed)
Subjective:   Karen Gonzales is a 74 y.o. female who presents for Medicare Annual (Subsequent) preventive examination.  I connected with Avanell  today by telephone and verified that I am speaking with the correct person using two identifiers. Location patient: home Location provider: work Persons participating in the virtual visit: patient, Marine scientist.    I discussed the limitations, risks, security and privacy concerns of performing an evaluation and management service by telephone and the availability of in person appointments. I also discussed with the patient that there may be a patient responsible charge related to this service. The patient expressed understanding and verbally consented to this telephonic visit.    Interactive audio and video telecommunications were attempted between this provider and patient, however failed, due to patient having technical difficulties OR patient did not have access to video capability.  We continued and completed visit with audio only.  Some vital signs may be absent or patient reported.   Time Spent with patient on telephone encounter: 20 minutes  Review of Systems     Cardiac Risk Factors include: advanced age (>37mn, >>46women);diabetes mellitus;hypertension;dyslipidemia;obesity (BMI >30kg/m2);sedentary lifestyle     Objective:    Today's Vitals   05/30/20 1115  Weight: 186 lb (84.4 kg)  Height: 5' 5" (1.651 m)   Body mass index is 30.95 kg/m.  Advanced Directives 05/30/2020 01/18/2020 05/28/2019 10/24/2017 08/12/2017 07/03/2011  Does Patient Have a Medical Advance Directive? _0  Patient does not have advance directive  Would patient like information on creating a medical advance directive? No - Patient declined No - Patient declined Yes (MAU/Ambulatory/Procedural Areas - Information given) No - Patient declined No - Patient declined -  Pre-existing out of facility DNR order (yellow form or pink MOST form) - - - - - No     Current Medications (verified) Outpatient Encounter Medications as of 05/30/2020  Medication Sig  . amLODipine (NORVASC) 5 MG tablet TAKE 1 TABLET BY MOUTH EVERY DAY  . Blood Glucose Monitoring Suppl (ONE TOUCH ULTRA 2) w/Device KIT Pt uses glucometer to check sugars twice daily. Dx E11.9  . cetirizine (ZYRTEC) 10 MG tablet TAKE 1 TABLET BY MOUTH EVERY DAY  . ferrous sulfate 325 (65 FE) MG tablet TAKE 1 TABLET BY MOUTH EVERY DAY WITH BREAKFAST. PLEASE SCHEDULE FOLLOW UP FOR DIABETES  . fluticasone (FLONASE) 50 MCG/ACT nasal spray Place 2 sprays into both nostrils daily.  .Marland Kitchengabapentin (NEURONTIN) 300 MG capsule TAKE 2 CAPSULES (600 MG TOTAL) BY MOUTH 3 (THREE) TIMES DAILY.  .Marland KitchenglipiZIDE (GLUCOTROL XL) 10 MG 24 hr tablet TAKE 1 TABLET (10 MG TOTAL) BY MOUTH DAILY WITH BREAKFAST.  .Marland KitchenJARDIANCE 10 MG TABS tablet TAKE 1 TABLET BY MOUTH EVERY DAY  . Lancets (ONETOUCH DELICA PLUS [id number] MISC USE WHEN CHECKING BLOOD SUGAR AS DIRECTED  . losartan (COZAAR) 50 MG tablet TAKE 2 TABLETS BY MOUTH EVERY DAY  . MELATONIN PO Take by mouth.  . meloxicam (MOBIC) 15 MG tablet   . metFORMIN (GLUCOPHAGE) 500 MG tablet TAKE 2 TABLETS (1,000 MG TOTAL) BY MOUTH 2 (TWO) TIMES DAILY WITH A MEAL.  .Marland Kitchenomeprazole (PRILOSEC) 40 MG capsule Take 1 capsule (40 mg total) by mouth 2 (two) times daily. (Patient taking differently: Take 40 mg by mouth daily. )  . ONETOUCH ULTRA test strip CHECK BLOOD SUGAR 1-2 TIMES DAILY  . simvastatin (ZOCOR) 10 MG tablet TAKE 1 TABLET BY MOUTH EVERYDAY AT BEDTIME  . [DISCONTINUED] dextromethorphan-guaiFENesin (MUCINEX DM) 30-600 MG  12hr tablet Take 1 tablet by mouth 2 (two) times daily.  . [DISCONTINUED] guaiFENesin-codeine (ROBITUSSIN AC) 100-10 MG/5ML syrup Take 10 mLs by mouth 3 (three) times daily as needed for cough.  . [DISCONTINUED] polyethylene glycol-electrolytes (NULYTELY) 420 g solution TAKE AS DIRECTED BY MOUTH ONCE FOR 1 DOSE   No facility-administered encounter medications  on file as of 05/30/2020.    Allergies (verified) Nsaids, Reglan [metoclopramide], and Estrogens   History: Past Medical History:  Diagnosis Date  . Anti-cardiolipin antibody syndrome ([facility]) 06/25/2011  . Arthritis   . Blood transfusion    had when knee was replaced  . Bronchitis    history of  . Chills   . Complication of anesthesia   . Cough   . COVID-19   . Diabetes mellitus   . DVT (deep venous thrombosis) ([place] [facility]) 06/25/2011  . Fatty liver disease, nonalcoholic    determinde by Ultrasound 11/11  . GERD (gastroesophageal reflux disease)   . H/O vitamin D deficiency   . Heart murmur   . Heart murmur    Benign  . Hiatal hernia   . Hyperlipidemia   . Hypertension   . Hyponatremia    with diuretic use  . Osteopenia   . PONV (postoperative nausea and vomiting)   . Vertigo    Past Surgical History:  Procedure Laterality Date  . ABDOMINAL HYSTERECTOMY    . ACHILLES TENDON SURGERY     bilateral   . CARPAL TUNNEL RELEASE     bilateral   . CHOLECYSTECTOMY  1969   open  . CRANIOTOMY  07/09/2011   Procedure: CRANIOTOMY HEMATOMA EVACUATION SUBDURAL;  Surgeon: Cooper Render Pool;  Location: [facility];  Service: Neurosurgery;  Laterality: Right;  Excision of Skull Lesion-Right Right Parietal Crani w/ Resection of Skull Tumor (1 1/2 hours - to follow)  . DILATION AND CURETTAGE OF UTERUS    . JOINT REPLACEMENT  2004   left knee replacement   . KNEE SURGERY     total of 6 surgeries on both knees   . REPAIR PERONEAL TENDONS ANKLE    . TUBAL LIGATION    . TUMOR REMOVAL     on pelvic bone    Family History  Problem Relation Age of Onset  . Kidney disease Mother   . Osteoporosis Mother   . Heart disease Father   . Cancer Father        lymphoma  . Osteoporosis Father   . Healthy Brother   . Cancer Maternal Grandmother        lymphoma  . Breast cancer Maternal Grandmother   . Cancer Paternal Grandmother        breast  . Breast cancer Paternal Aunt   . Hodgkin's lymphoma  Grandchild    Social History   Socioeconomic History  . Marital status: Married    Spouse name: Not on file  . Number of children: Not on file  . Years of education: Not on file  . Highest education level: Not on file  Occupational History  . Not on file  Tobacco Use  . Smoking status: Never Smoker  . Smokeless tobacco: Never Used  Vaping Use  . Vaping Use: Never used  Substance and Sexual Activity  . Alcohol use: No  . Drug use: No  . Sexual activity: Not on file  Other Topics Concern  . Not on file  Social History Narrative  . Not on file   Social Determinants of Health   Financial Resource  Strain: Low Risk   . Difficulty of Paying Living Expenses: Not hard at all  Food Insecurity: No Food Insecurity  . Worried About Charity fundraiser in the Last Year: Never true  . Ran Out of Food in the Last Year: Never true  Transportation Needs: No Transportation Needs  . Lack of Transportation (Medical): No  . Lack of Transportation (Non-Medical): No  Physical Activity: Inactive  . Days of Exercise per Week: 0 days  . Minutes of Exercise per Session: 0 min  Stress: No Stress Concern Present  . Feeling of Stress : Not at all  Social Connections: Socially Integrated  . Frequency of Communication with Friends and Family: More than three times a week  . Frequency of Social Gatherings with Friends and Family: Once a week  . Attends Religious Services: More than 4 times per year  . Active Member of Clubs or Organizations: Yes  . Attends Archivist Meetings: 1 to 4 times per year  . Marital Status: Married    Tobacco Counseling Counseling given: Not Answered   Clinical Intake:  Pre-visit preparation completed: Yes  Pain : No/denies pain     Nutritional Status: BMI > 30  Obese Nutritional Risks: None Diabetes: Yes CBG done?: No Did pt. bring in CBG monitor from home?: No (phone visit)  How often do you need to have someone help you when you read  instructions, pamphlets, or other written materials from your doctor or pharmacy?: 1 - Never What is the last grade level you completed in school?: GED  Diabetes:  Is the patient diabetic?  Yes  If diabetic, was a CBG obtained today?  No  Did the patient bring in their glucometer from home?  No phone visit How often do you monitor your CBG's? daily.   Financial Strains and Diabetes Management:  Are you having any financial strains with the device, your supplies or your medication? No .  Does the patient want to be seen by Chronic Care Management for management of their diabetes?  No  Would the patient like to be referred to a Nutritionist or for Diabetic Management?  No   Diabetic Exams:  Diabetic Eye Exam: Completed 10/30/2019.  Diabetic Foot Exam:  Pt has been advised about the importance in completing this exam. To be completed by PCP    Interpreter Needed?: No  Information entered by :: Caroleen Hamman LPN   Activities of Daily Living In your present state of health, do you have any difficulty performing the following activities: 05/30/2020 03/02/2020  Hearing? N N  Vision? N N  Difficulty concentrating or making decisions? N N  Walking or climbing stairs? N N  Dressing or bathing? N N  Doing errands, shopping? N N  Preparing Food and eating ? N -  Using the Toilet? N -  In the past six months, have you accidently leaked urine? N -  Do you have problems with loss of bowel control? N -  Managing your Medications? N -  Managing your Finances? N -  Housekeeping or managing your Housekeeping? N -  Some recent data might be hidden    Patient Care Team: [name], MD as PCP - General (Family Medicine) Earnie Larsson, MD as Consulting Physician (Neurosurgery) Laurence Spates, MD (Inactive) as Consulting Physician (Gastroenterology) Paulla Dolly Tamala Fothergill, DPM as Consulting Physician (Podiatry) Madelin Rear, [facility] as Pharmacist (Pharmacist)  Indicate any recent Medical  Services you may have received from other than [facility] providers in  the past year (date may be approximate).     Assessment:   This is a routine wellness examination for [place].  Hearing/Vision screen  Hearing Screening   125Hz 250Hz 500Hz 1000Hz 2000Hz 3000Hz 4000Hz 6000Hz 8000Hz  Right ear:           Left ear:           Vision Screening Comments: Wears glasses Dr. Sabra Heck  Dietary issues and exercise activities discussed: Current Exercise Habits: The patient does not participate in regular exercise at present, Exercise limited by: None identified  Goals    . A1c <7.5%     CARE PLAN ENTRY Current Barriers:  . Diabetes: type 2; complicated by chronic medical conditions including high blood pressure Lab Results  Component Value Date   HGBA1C 8.3 (H) 11/30/2019 .   Lab Results  Component Value Date   CREATININE 0.63 11/30/2019   CREATININE 0.67 08/31/2019   CREATININE 0.74 05/28/2019 .  Current antihyperglycemic regimen:  o Glipizide 10 mg daily o Jardiance 10 mg daily  o Metformin 1000 mg twice daily  . Denies hypoglycemic symptoms, including dizziness, lightheadedness, shaking, sweating. . Current exercise: limited due to upcoming ankle surgery. Would like to resume walking.   Marland Kitchen Recent FBG Readings: 152 today, typically 140s, 150s. . Cardiovascular risk reduction: Pharmacist Clinical Goal(s):  Marland Kitchen Over the next 180 days, patient will work with PharmD and primary care provider to address A1c goal <7.5%. Interventions: . Comprehensive medication review performed, medication list updated in electronic medical record . Inter-disciplinary care team collaboration . Jardiance patient assistance Patient Self Care Activities:  . Patient will check blood glucose once daily, document, and provide at future appointments . Patient will focus on medication adherence by continuing current medication management  . Patient will take medications as prescribed . Patient will contact provider  with any episodes of hypoglycemia . Patient will report any questions or concerns to provider  Initial goal documentation.    . Blood pressure <140/90     CARE PLAN ENTRY Current Barriers:  . Current antihypertensive regimen:    Losartan 100 mg daily   Amlodipine 5 mg daily  . Last practice recorded BP readings:  BP Readings from Last 3 Encounters:  11/30/19 (!) 142/70  10/09/19 (!) 151/75  08/31/19 124/84  Pharmacist Clinical Goal(s):  Marland Kitchen Over the next 180 days, patient will work with PharmD and providers to optimize antihypertensive regimen o BP <140/90 Interventions: . Inter-disciplinary care team collaboration . Comprehensive medication review performed; medication list updated in the electronic medical record.  Patient Self Care Activities:  . Patient will continue to check BP routinely, at least once weekly - document, and provide at future appointments . Patient will focus on medication adherence by continuing current medication management.  Initial goal documentation.    . LDL Cholesterol <70     CARE PLAN ENTRY Current Barriers:  . Current antihyperlipidemic regimen:  o Simvastatin 10 mg daily . Previous antihyperlipidemic medications tried: Pravastatin 20 mg   . Most recent lipid panel:     Component Value Date/Time   CHOL 147 11/30/2019 0949   TRIG 199.0 (H) 11/30/2019 0949   HDL 43.70 11/30/2019 0949   CHOLHDL 3 11/30/2019 0949   VLDL 39.8 11/30/2019 0949   LDLCALC 64 11/30/2019 0949   LDLCALC 75 10/24/2017 1559   LDLDIRECT 104.0 04/08/2017 1125  Pharmacist Clinical Goal(s):  Marland Kitchen Over the next 180 days, patient will work with PharmD and providers towards optimized antihyperlipidemic therapy o  LDL <70 Interventions: . Comprehensive medication review performed; medication list updated in electronic medical record.  Patient Self Care Activities:  . Patient will focus on medication adherence by continuing current medication management.  Initial goal  documentation.     . Patient Stated     Increase activity by walking more      Depression Screen PHQ 2/9 Scores 05/30/2020 03/02/2020 11/30/2019 08/31/2019 05/28/2019 05/28/2019 10/28/2018  PHQ - 2 Score 0 0 0 0 0 0 0  PHQ- 9 Score - 0 0 0 0 0 0    Fall Risk Fall Risk  05/30/2020 03/02/2020 11/30/2019 11/30/2019 08/31/2019  Falls in the past year? 0 0 0 0 0  Number falls in past yr: 0 0 0 0 0  Injury with Fall? 0 0 0 0 0  Follow up Falls prevention discussed Falls evaluation completed Falls evaluation completed Falls evaluation completed Falls evaluation completed    Any stairs in or around the home? No  Home free of loose throw rugs in walkways, pet beds, electrical cords, etc? Yes  Adequate lighting in your home to reduce risk of falls? Yes     ASSISTIVE DEVICES UTILIZED TO PREVENT FALLS:  Life alert? No  Use of a cane, walker or w/c? No  Grab bars in the bathroom? Yes  Shower chair or bench in shower? Yes  Elevated toilet seat or a handicapped toilet? No   TIMED UP AND GO:  Was the test performed? No . Phone visit   Cognitive Function:No cognitive impairment noted MMSE - Mini Mental State Exam 10/24/2017  Orientation to time 5  Orientation to Place 5  Registration 3  Attention/ Calculation 5  Recall 2  Language- name 2 objects 2  Language- repeat 1  Language- follow 3 step command 3  Language- read & follow direction 1  Write a sentence 1  Copy design 1  Total score 29     6CIT Screen 05/30/2020  What Year? 0 points  What month? 0 points  What time? 0 points  Count back from 20 0 points  Months in reverse 2 points  Repeat phrase 0 points  Total Score 2    Immunizations Immunization History  Administered Date(s) Administered  . Fluad Quad(high Dose 65+) 05/28/2019  . Influenza, High Dose Seasonal PF 04/28/2018  . Influenza,inj,Quad PF,6+ Mos 06/14/2015, 05/14/2016, 04/08/2017  . Pneumococcal Conjugate-13 10/23/2013  . Pneumococcal Polysaccharide-23  11/04/2014  . Tdap 03/13/2011    TDAP status: Up to date   Flu vaccine status: Due-Patient plans to get vaccine this week at her office visit with PCP.  Pneumococcal vaccine status: Up to date   Covid-19 vaccine status: Declined, Education has been provided regarding the importance of this vaccine but patient still declined. Advised may receive this vaccine at local pharmacy or Health Dept.or vaccine clinic. Aware to provide a copy of the vaccination record if obtained from local pharmacy or Health Dept. Verbalized acceptance and understanding.  Qualifies for Shingles Vaccine? Yes   Zostavax completed No   Shingrix Completed?: No.    Education has been provided regarding the importance of this vaccine. Patient has been advised to call insurance company to determine out of pocket expense if they have not yet received this vaccine. Advised may also receive vaccine at local pharmacy or Health Dept. Verbalized acceptance and understanding.  Screening Tests Health Maintenance  Topic Date Due  . Hepatitis C Screening  Never done  . MAMMOGRAM  01/28/2020  . INFLUENZA VACCINE  03/13/2020  .  FOOT EXAM  05/27/2020  . COVID-19 Vaccine (1) 11/11/2020 (Originally 06/03/1958)  . DEXA SCAN  03/02/2021 (Originally 11/19/2019)  . HEMOGLOBIN A1C  09/02/2020  . OPHTHALMOLOGY EXAM  10/29/2020  . TETANUS/TDAP  03/12/2021  . COLONOSCOPY  08/23/2029  . PNA vac Low Risk Adult  Completed    Health Maintenance  Health Maintenance Due  Topic Date Due  . Hepatitis C Screening  Never done  . MAMMOGRAM  01/28/2020  . INFLUENZA VACCINE  03/13/2020  . FOOT EXAM  05/27/2020    Colorectal cancer screening: Completed 08/27/2019. Repeat every 10 years   Mammogram status: Completed 01/28/2019. Repeat every year Patient plans to call to schedule  Bone Density status: Completed 11/18/2017. Results reflect: Bone density results: NORMAL. Repeat every 2-5 years. Patient to discuss with PCP  Lung Cancer Screening:  (Low Dose CT Chest recommended if Age 67-80 years, 30 pack-year currently smoking OR have quit w/in 15years.) does not qualify.     Additional Screening:  Hepatitis C Screening: does qualify; Discuss with PCP  Vision Screening: Recommended annual ophthalmology exams for early detection of glaucoma and other disorders of the eye. Is the patient up to date with their annual eye exam?  Yes  Who is the provider or what is the name of the office in which the patient attends annual eye exams? Dr. Sabra Heck   Dental Screening: Recommended annual dental exams for proper oral hygiene  Community Resource Referral / Chronic Care Management: CRR required this visit?  No   CCM required this visit?  No      Plan:     I have personally reviewed and noted the following in the patient's chart:   . Medical and social history . Use of alcohol, tobacco or illicit drugs  . Current medications and supplements . Functional ability and status . Nutritional status . Physical activity . Advanced directives . List of other physicians . Hospitalizations, surgeries, and ER visits in previous 12 months . Vitals . Screenings to include cognitive, depression, and falls . Referrals and appointments  In addition, I have reviewed and discussed with patient certain preventive protocols, quality metrics, and best practice recommendations. A written personalized care plan for preventive services as well as general preventive health recommendations were provided to patient.    Due to this being a telephonic visit, the after visit summary with patients personalized plan was offered to patient via mail or my-chart.  Patient would like to access on my-chart.   Marta Antu, LPN   [id number]  Nurse Health Advisor  Nurse Notes: None

## 2020-05-30 NOTE — Patient Instructions (Signed)
Karen Gonzales , Thank you for taking time to complete your Medicare Wellness Visit. I appreciate your ongoing commitment to your health goals. Please review the following plan we discussed and let me know if I can assist you in the future.   Screening recommendations/referrals: Colonoscopy: Completed 08/27/2019-No longer required after age 73 Mammogram: Due- Per our conversation, you will call & schedule appointment. Bone Density: Completed 11/18/2017. Discuss with PCP to see when next testing is due. Recommended yearly ophthalmology/optometry visit for glaucoma screening and checkup Recommended yearly dental visit for hygiene and checkup  Vaccinations: Influenza vaccine: Due- May obtain at our office or your local pharmacy. Pneumococcal vaccine: Completed vaccines Tdap vaccine: Up to Date-Due-03/12/2021 Shingles vaccine: Discuss with pharmacy   Covid-19:Declined  Advanced directives: Declined information.  Conditions/risks identified: See problem list  Next appointment: Follow up in one year for your annual wellness visit  06/05/2021 @ 11:15am   Preventive Care 65 Years and Older, Female Preventive care refers to lifestyle choices and visits with your health care provider that can promote health and wellness. What does preventive care include?  A yearly physical exam. This is also called an annual well check.  Dental exams once or twice a year.  Routine eye exams. Ask your health care provider how often you should have your eyes checked.  Personal lifestyle choices, including:  Daily care of your teeth and gums.  Regular physical activity.  Eating a healthy diet.  Avoiding tobacco and drug use.  Limiting alcohol use.  Practicing safe sex.  Taking low-dose aspirin every day.  Taking vitamin and mineral supplements as recommended by your health care provider. What happens during an annual well check? The services and screenings done by your health care provider during  your annual well check will depend on your age, overall health, lifestyle risk factors, and family history of disease. Counseling  Your health care provider may ask you questions about your:  Alcohol use.  Tobacco use.  Drug use.  Emotional well-being.  Home and relationship well-being.  Sexual activity.  Eating habits.  History of falls.  Memory and ability to understand (cognition).  Work and work Statistician.  Reproductive health. Screening  You may have the following tests or measurements:  Height, weight, and BMI.  Blood pressure.  Lipid and cholesterol levels. These may be checked every 5 years, or more frequently if you are over 28 years old.  Skin check.  Lung cancer screening. You may have this screening every year starting at age 46 if you have a 30-pack-year history of smoking and currently smoke or have quit within the past 15 years.  Fecal occult blood test (FOBT) of the stool. You may have this test every year starting at age 58.  Flexible sigmoidoscopy or colonoscopy. You may have a sigmoidoscopy every 5 years or a colonoscopy every 10 years starting at age 26.  Hepatitis C blood test.  Hepatitis B blood test.  Sexually transmitted disease (STD) testing.  Diabetes screening. This is done by checking your blood sugar (glucose) after you have not eaten for a while (fasting). You may have this done every 1-3 years.  Bone density scan. This is done to screen for osteoporosis. You may have this done starting at age 18.  Mammogram. This may be done every 1-2 years. Talk to your health care provider about how often you should have regular mammograms. Talk with your health care provider about your test results, treatment options, and if necessary, the need for more  tests. Vaccines  Your health care provider may recommend certain vaccines, such as:  Influenza vaccine. This is recommended every year.  Tetanus, diphtheria, and acellular pertussis (Tdap,  Td) vaccine. You may need a Td booster every 10 years.  Zoster vaccine. You may need this after age 68.  Pneumococcal 13-valent conjugate (PCV13) vaccine. One dose is recommended after age 56.  Pneumococcal polysaccharide (PPSV23) vaccine. One dose is recommended after age 21. Talk to your health care provider about which screenings and vaccines you need and how often you need them. This information is not intended to replace advice given to you by your health care provider. Make sure you discuss any questions you have with your health care provider. Document Released: 08/26/2015 Document Revised: 04/18/2016 Document Reviewed: 05/31/2015 Elsevier Interactive Patient Education  2017 [employer] Prevention in the Home Falls can cause injuries. They can happen to people of all ages. There are many things you can do to make your home safe and to help prevent falls. What can I do on the outside of my home?  Regularly fix the edges of walkways and driveways and fix any cracks.  Remove anything that might make you trip as you walk through a door, such as a raised step or threshold.  Trim any bushes or trees on the path to your home.  Use bright outdoor lighting.  Clear any walking paths of anything that might make someone trip, such as rocks or tools.  Regularly check to see if handrails are loose or broken. Make sure that both sides of any steps have handrails.  Any raised decks and porches should have guardrails on the edges.  Have any leaves, snow, or ice cleared regularly.  Use sand or salt on walking paths during winter.  Clean up any spills in your garage right away. This includes oil or grease spills. What can I do in the bathroom?  Use night lights.  Install grab bars by the toilet and in the tub and shower. Do not use towel bars as grab bars.  Use non-skid mats or decals in the tub or shower.  If you need to sit down in the shower, use a plastic, non-slip  stool.  Keep the floor dry. Clean up any water that spills on the floor as soon as it happens.  Remove soap buildup in the tub or shower regularly.  Attach bath mats securely with double-sided non-slip rug tape.  Do not have throw rugs and other things on the floor that can make you trip. What can I do in the bedroom?  Use night lights.  Make sure that you have a light by your bed that is easy to reach.  Do not use any sheets or blankets that are too big for your bed. They should not hang down onto the floor.  Have a firm chair that has side arms. You can use this for support while you get dressed.  Do not have throw rugs and other things on the floor that can make you trip. What can I do in the kitchen?  Clean up any spills right away.  Avoid walking on wet floors.  Keep items that you use a lot in easy-to-reach places.  If you need to reach something above you, use a strong step stool that has a grab bar.  Keep electrical cords out of the way.  Do not use floor polish or wax that makes floors slippery. If you must use wax, use non-skid floor  wax.  Do not have throw rugs and other things on the floor that can make you trip. What can I do with my stairs?  Do not leave any items on the stairs.  Make sure that there are handrails on both sides of the stairs and use them. Fix handrails that are broken or loose. Make sure that handrails are as long as the stairways.  Check any carpeting to make sure that it is firmly attached to the stairs. Fix any carpet that is loose or worn.  Avoid having throw rugs at the top or bottom of the stairs. If you do have throw rugs, attach them to the floor with carpet tape.  Make sure that you have a light switch at the top of the stairs and the bottom of the stairs. If you do not have them, ask someone to add them for you. What else can I do to help prevent falls?  Wear shoes that:  Do not have high heels.  Have rubber bottoms.  Are  comfortable and fit you well.  Are closed at the toe. Do not wear sandals.  If you use a stepladder:  Make sure that it is fully opened. Do not climb a closed stepladder.  Make sure that both sides of the stepladder are locked into place.  Ask someone to hold it for you, if possible.  Clearly mark and make sure that you can see:  Any grab bars or handrails.  First and last steps.  Where the edge of each step is.  Use tools that help you move around (mobility aids) if they are needed. These include:  Canes.  Walkers.  Scooters.  Crutches.  Turn on the lights when you go into a dark area. Replace any light bulbs as soon as they burn out.  Set up your furniture so you have a clear path. Avoid moving your furniture around.  If any of your floors are uneven, fix them.  If there are any pets around you, be aware of where they are.  Review your medicines with your doctor. Some medicines can make you feel dizzy. This can increase your chance of falling. Ask your doctor what other things that you can do to help prevent falls. This information is not intended to replace advice given to you by your health care provider. Make sure you discuss any questions you have with your health care provider. Document Released: 05/26/2009 Document Revised: 01/05/2016 Document Reviewed: 09/03/2014 Elsevier Interactive Patient Education  2017 [employer].

## 2020-05-31 ENCOUNTER — Other Ambulatory Visit: Payer: Self-pay | Admitting: Family Medicine

## 2020-06-02 ENCOUNTER — Other Ambulatory Visit: Payer: Self-pay

## 2020-06-02 ENCOUNTER — Ambulatory Visit (INDEPENDENT_AMBULATORY_CARE_PROVIDER_SITE_OTHER): Payer: PPO | Admitting: Family Medicine

## 2020-06-02 ENCOUNTER — Encounter: Payer: Self-pay | Admitting: Family Medicine

## 2020-06-02 VITALS — BP 160/70 | HR 81 | Temp 98.3°F | Resp 17 | Wt 187.4 lb

## 2020-06-02 DIAGNOSIS — E1140 Type 2 diabetes mellitus with diabetic neuropathy, unspecified: Secondary | ICD-10-CM

## 2020-06-02 DIAGNOSIS — E785 Hyperlipidemia, unspecified: Secondary | ICD-10-CM

## 2020-06-02 DIAGNOSIS — Z1159 Encounter for screening for other viral diseases: Secondary | ICD-10-CM

## 2020-06-02 DIAGNOSIS — I10 Essential (primary) hypertension: Secondary | ICD-10-CM | POA: Diagnosis not present

## 2020-06-02 DIAGNOSIS — Z23 Encounter for immunization: Secondary | ICD-10-CM | POA: Diagnosis not present

## 2020-06-02 LAB — TSH: TSH: 1.76 u[IU]/mL (ref 0.35–4.50)

## 2020-06-02 LAB — CBC WITH DIFFERENTIAL/PLATELET
Basophils Absolute: 0.1 10*3/uL (ref 0.0–0.1)
Basophils Relative: 1.4 % (ref 0.0–3.0)
Eosinophils Absolute: 0.3 10*3/uL (ref 0.0–0.7)
Eosinophils Relative: 3.5 % (ref 0.0–5.0)
HCT: 37.9 % (ref 36.0–46.0)
Hemoglobin: 12.5 g/dL (ref 12.0–15.0)
Lymphocytes Relative: 32.5 % (ref 12.0–46.0)
Lymphs Abs: 2.4 10*3/uL (ref 0.7–4.0)
MCHC: 33 g/dL (ref 30.0–36.0)
MCV: 82.9 fl (ref 78.0–100.0)
Monocytes Absolute: 0.7 10*3/uL (ref 0.1–1.0)
Monocytes Relative: 9.2 % (ref 3.0–12.0)
Neutro Abs: 4 10*3/uL (ref 1.4–7.7)
Neutrophils Relative %: 53.4 % (ref 43.0–77.0)
Platelets: 215 10*3/uL (ref 150.0–400.0)
RBC: 4.58 Mil/uL (ref 3.87–5.11)
RDW: 13.8 % (ref 11.5–15.5)
WBC: 7.5 10*3/uL (ref 4.0–10.5)

## 2020-06-02 LAB — BASIC METABOLIC PANEL
BUN: 20 mg/dL (ref 6–23)
CO2: 27 mEq/L (ref 19–32)
Calcium: 9.8 mg/dL (ref 8.4–10.5)
Chloride: 98 mEq/L (ref 96–112)
Creatinine, Ser: 0.73 mg/dL (ref 0.40–1.20)
GFR: 81.83 mL/min (ref >60.00)
Glucose, Bld: 127 mg/dL — ABNORMAL HIGH (ref 70–99)
Potassium: 4.7 mEq/L (ref 3.5–5.1)
Sodium: 135 mEq/L (ref 135–145)

## 2020-06-02 LAB — LIPID PANEL
Cholesterol: 158 mg/dL (ref 0–200)
HDL: 51.1 mg/dL (ref >39.00)
LDL Cholesterol: 71 mg/dL (ref 0–99)
NonHDL: 107.18
Total CHOL/HDL Ratio: 3
Triglycerides: 180 mg/dL — ABNORMAL HIGH (ref 0.0–149.0)
VLDL: 36 mg/dL (ref 0.0–40.0)

## 2020-06-02 LAB — HEPATIC FUNCTION PANEL
ALT: 40 U/L — ABNORMAL HIGH (ref 0–35)
AST: 29 U/L (ref 0–37)
Albumin: 4.5 g/dL (ref 3.5–5.2)
Alkaline Phosphatase: 51 U/L (ref 39–117)
Bilirubin, Direct: 0.1 mg/dL (ref 0.0–0.3)
Total Bilirubin: 0.6 mg/dL (ref 0.2–1.2)
Total Protein: 7.5 g/dL (ref 6.0–8.3)

## 2020-06-02 LAB — HEMOGLOBIN A1C: Hgb A1c MFr Bld: 7.8 % — ABNORMAL HIGH (ref 4.6–6.5)

## 2020-06-02 MED ORDER — AMLODIPINE BESYLATE 10 MG PO TABS: 10.0000 mg | ORAL_TABLET | Freq: Every day | ORAL | 3 refills | Status: DC

## 2020-06-02 NOTE — Assessment & Plan Note (Signed)
Chronic problem.  On Metformin 1060m BID, Jardiance 179mdaily, and Glipizide 1026maily.  On ARB for renal protection, UTD on eye exam.  Foot exam done today.  Last A1C 7.3.  Check labs.  Adjust meds prn

## 2020-06-02 NOTE — Assessment & Plan Note (Signed)
Chronic problem.  Tolerating Simvastatin 20m daily w/o difficulty.  Check labs.  Adjust meds prn

## 2020-06-02 NOTE — Patient Instructions (Addendum)
Follow up in 1 month to recheck BP We'll notify you of your lab results and make any changes if needed Increase your Amlodipine to 57m (2 of what you have at home and 1 of the new prescription) Continue to work on healthy diet and regular exercise Please get the COVID vaccines Call with any questions or concerns Stay Safe!  Stay Healthy!

## 2020-06-02 NOTE — Progress Notes (Signed)
   Subjective:    Patient ID: Karen Gonzales, female    DOB: 03-Sep-1945, 74 y.o.   MRN: [id number]  HPI DM- chronic problem, on Metformin 1026m BID, Jardiance 134mdaily, Glipizide 1031maily.  On ARB for renal protection.  UTD on eye exam.  Due for foot exam.  Some tingling along L lateral foot s/p surgery.  Denies symptomatic lows.  HTN- chronic problem, on Losartan 100m32mily and Amlodipine 5mg 61mly.  Denies CP, SOB, HAs, visual changes, edema.  Hyperlipidemia- chronic problem, on Simvastatin 10mg 46my.  No abd pain, N/V.   Review of Systems For ROS see HPI   This visit occurred during the SARS-CoV-2 public health emergency.  Safety protocols were in place, including screening questions prior to the visit, additional usage of staff PPE, and extensive cleaning of exam room while observing appropriate contact time as indicated for disinfecting solutions.       Objective:   Physical Exam Vitals reviewed.  Constitutional:      General: She is not in acute distress.    Appearance: She is well-developed. She is obese.  HENT:     Head: Normocephalic and atraumatic.  Eyes:     Conjunctiva/sclera: Conjunctivae normal.     Pupils: Pupils are equal, round, and reactive to light.  Neck:     Thyroid: No thyromegaly.  Cardiovascular:     Rate and Rhythm: Normal rate and regular rhythm.     Heart sounds: Murmur (II/VI SEM at RUSB) heard.   Pulmonary:     Effort: Pulmonary effort is normal. No respiratory distress.     Breath sounds: Normal breath sounds.  Abdominal:     General: There is no distension.     Palpations: Abdomen is soft.     Tenderness: There is no abdominal tenderness.  Musculoskeletal:     Cervical back: Normal range of motion and neck supple.  Lymphadenopathy:     Cervical: No cervical adenopathy.  Skin:    General: Skin is warm and dry.  Neurological:     Mental Status: She is alert and oriented to person, place, and time.  Psychiatric:        Behavior:  Behavior normal.           Assessment & Plan:

## 2020-06-02 NOTE — Assessment & Plan Note (Signed)
Deteriorated.  BP is elevated today and was elevated in ER as well.  Will increase Amlodipine to 64m daily and monitor closely for improvement.  Pt expressed understanding and is in agreement w/ plan.

## 2020-06-03 ENCOUNTER — Other Ambulatory Visit: Payer: Self-pay | Admitting: Emergency Medicine

## 2020-06-03 DIAGNOSIS — E1140 Type 2 diabetes mellitus with diabetic neuropathy, unspecified: Secondary | ICD-10-CM

## 2020-06-03 MED ORDER — EMPAGLIFLOZIN 10 MG PO TABS: 10.0000 mg | ORAL_TABLET | Freq: Every day | ORAL | 1 refills | Status: DC

## 2020-06-03 MED ORDER — GLIPIZIDE ER 10 MG PO TB24: 10.0000 mg | ORAL_TABLET | Freq: Every day | ORAL | 1 refills | Status: DC

## 2020-06-03 MED ORDER — METFORMIN HCL 500 MG PO TABS: 1000.0000 mg | ORAL_TABLET | Freq: Two times a day (BID) | ORAL | 1 refills | Status: DC

## 2020-06-06 LAB — HEPATITIS C ANTIBODY
Hepatitis C Ab: NONREACTIVE
SIGNAL TO CUT-OFF: 0.01 (ref <1.00)

## 2020-06-16 ENCOUNTER — Other Ambulatory Visit: Payer: Self-pay | Admitting: Family Medicine

## 2020-06-16 DIAGNOSIS — Z1231 Encounter for screening mammogram for malignant neoplasm of breast: Secondary | ICD-10-CM

## 2020-06-17 ENCOUNTER — Other Ambulatory Visit: Payer: Self-pay

## 2020-06-17 ENCOUNTER — Ambulatory Visit
Admission: RE | Admit: 2020-06-17 | Discharge: 2020-06-17 | Disposition: A | Discharge status: Home / Self Care | Payer: PPO | Source: Ambulatory Visit | Attending: Family Medicine | Admitting: Family Medicine

## 2020-06-17 DIAGNOSIS — Z1231 Encounter for screening mammogram for malignant neoplasm of breast: Secondary | ICD-10-CM | POA: Diagnosis not present

## 2020-07-05 ENCOUNTER — Ambulatory Visit (INDEPENDENT_AMBULATORY_CARE_PROVIDER_SITE_OTHER): Payer: PPO | Admitting: Family Medicine

## 2020-07-05 ENCOUNTER — Encounter: Payer: Self-pay | Admitting: Family Medicine

## 2020-07-05 ENCOUNTER — Other Ambulatory Visit: Payer: Self-pay

## 2020-07-05 VITALS — BP 142/70 | HR 77 | Temp 98.5°F | Resp 20 | Ht 65.0 in | Wt 188.6 lb

## 2020-07-05 DIAGNOSIS — I10 Essential (primary) hypertension: Secondary | ICD-10-CM | POA: Diagnosis not present

## 2020-07-05 NOTE — Progress Notes (Signed)
   Subjective:    Patient ID: Karen Gonzales, female    DOB: [birth year], 74 y.o.   MRN: [id number]  HPI HTN- at last visit, Amlodipine was increased to 78m daily.  BP is much better today.  She is also on Losartan 531m  No CP, SOB, HAs, visual changes, edema.   Review of Systems For ROS see HPI   This visit occurred during the SARS-CoV-2 public health emergency.  Safety protocols were in place, including screening questions prior to the visit, additional usage of staff PPE, and extensive cleaning of exam room while observing appropriate contact time as indicated for disinfecting solutions.       Objective:   Physical Exam Vitals reviewed.  Constitutional:      General: She is not in acute distress.    Appearance: Normal appearance. She is well-developed. She is obese.  HENT:     Head: Normocephalic and atraumatic.  Eyes:     Conjunctiva/sclera: Conjunctivae normal.     Pupils: Pupils are equal, round, and reactive to light.  Neck:     Thyroid: No thyromegaly.  Cardiovascular:     Rate and Rhythm: Normal rate and regular rhythm.     Heart sounds: Normal heart sounds. No murmur heard.   Pulmonary:     Effort: Pulmonary effort is normal. No respiratory distress.     Breath sounds: Normal breath sounds.  Abdominal:     General: There is no distension.     Palpations: Abdomen is soft.     Tenderness: There is no abdominal tenderness.  Musculoskeletal:     Cervical back: Normal range of motion and neck supple.     Right lower leg: No edema.     Left lower leg: No edema.  Lymphadenopathy:     Cervical: No cervical adenopathy.  Skin:    General: Skin is warm and dry.  Neurological:     Mental Status: She is alert and oriented to person, place, and time.  Psychiatric:        Behavior: Behavior normal.           Assessment & Plan:

## 2020-07-05 NOTE — Assessment & Plan Note (Signed)
Chronic problem.  Much improved since increasing Amlodipine to 76m daily.  No changes at this time.  Will follow.

## 2020-07-05 NOTE — Patient Instructions (Signed)
Follow up in 3 months to recheck diabetes No need for labs today- yay!!! No med changes at this time- continue the Amlodipine 67m and Losartan 538mdaily Call with any questions or concerns Stay Safe!  Stay Healthy! Happy Thanksgiving!!

## 2020-07-15 ENCOUNTER — Other Ambulatory Visit: Payer: Self-pay

## 2020-07-15 ENCOUNTER — Other Ambulatory Visit: Payer: Self-pay | Admitting: Family Medicine

## 2020-07-15 DIAGNOSIS — I10 Essential (primary) hypertension: Secondary | ICD-10-CM

## 2020-07-15 MED ORDER — AMLODIPINE BESYLATE 10 MG PO TABS: 10.0000 mg | ORAL_TABLET | Freq: Every day | ORAL | 2 refills | Status: DC

## 2020-07-27 ENCOUNTER — Other Ambulatory Visit: Payer: Self-pay | Admitting: Family Medicine

## 2020-08-02 ENCOUNTER — Other Ambulatory Visit: Payer: Self-pay | Admitting: Family Medicine

## 2020-08-08 ENCOUNTER — Other Ambulatory Visit: Payer: Self-pay | Admitting: Family Medicine

## 2020-09-06 ENCOUNTER — Telehealth: Payer: Self-pay

## 2020-09-06 NOTE — Chronic Care Management (AMB) (Signed)
Chronic Care Management Pharmacy Assistant   Name: CARRERA KIESEL  MRN: [id number] DOB: 1945-09-23  Reason for Encounter: Disease State/ Diabetes Adherence Call  Patient Questions:  1.  Have you seen any other providers since your last visit? 06/02/2020 OV [name], MD increase Amlodipine to 10 mg daily. 07/05/2020 Adelina Mings, MD.  2.  Any changes in your medicines or health? Yes, Amlodipine increased to 10 mg daily.   PCP : [name], MD  Allergies:   Allergies  Allergen Reactions  . Nsaids Other (See Comments)  . Reglan [Metoclopramide] Other (See Comments)    Tremor   . Estrogens Rash    Topical patch caused the rash    Medications: Outpatient Encounter Medications as of 09/06/2020  Medication Sig  . amLODipine (NORVASC) 10 MG tablet Take 1 tablet (10 mg total) by mouth daily.  . Blood Glucose Monitoring Suppl (ONE TOUCH ULTRA 2) w/Device KIT Pt uses glucometer to check sugars twice daily. Dx E11.9  . cetirizine (ZYRTEC) 10 MG tablet TAKE 1 TABLET BY MOUTH EVERY DAY (Patient not taking: Reported on 06/02/2020)  . empagliflozin (JARDIANCE) 10 MG TABS tablet Take 1 tablet (10 mg total) by mouth daily.  . ferrous sulfate 325 (65 FE) MG tablet TAKE 1 TABLET BY MOUTH EVERY DAY WITH BREAKFAST. PLEASE SCHEDULE FOLLOW UP FOR DIABETES (Patient not taking: Reported on 06/02/2020)  . fluticasone (FLONASE) 50 MCG/ACT nasal spray Place 2 sprays into both nostrils daily.  Marland Kitchen gabapentin (NEURONTIN) 300 MG capsule TAKE 2 CAPSULES (600 MG TOTAL) BY MOUTH 3 (THREE) TIMES DAILY.  Marland Kitchen glipiZIDE (GLUCOTROL XL) 10 MG 24 hr tablet Take 1 tablet (10 mg total) by mouth daily with breakfast.  . Lancets (ONETOUCH DELICA PLUS [id number]) MISC USE WHEN CHECKING BLOOD SUGAR AS DIRECTED  . losartan (COZAAR) 50 MG tablet TAKE 2 TABLETS BY MOUTH EVERY DAY  . MELATONIN PO Take by mouth.  . meloxicam (MOBIC) 15 MG tablet   . metFORMIN (GLUCOPHAGE) 500 MG tablet Take 2  tablets (1,000 mg total) by mouth 2 (two) times daily with a meal.  . omeprazole (PRILOSEC) 40 MG capsule Take 1 capsule (40 mg total) by mouth 2 (two) times daily. (Patient taking differently: Take 40 mg by mouth daily. )  . ONETOUCH ULTRA test strip CHECK BLOOD SUGAR 1-2 TIMES DAILY  . simvastatin (ZOCOR) 10 MG tablet TAKE 1 TABLET BY MOUTH EVERYDAY AT BEDTIME   No facility-administered encounter medications on file as of 09/06/2020.    Current Diagnosis: Patient Active Problem List   Diagnosis Date Noted  . Dental disorder 03/02/2020  . Chronic venous insufficiency 10/09/2019  . Varicose veins of left lower extremity with pain 08/31/2019  . Obesity (BMI 30-39.9) 08/31/2019  . Heart murmur 10/24/2017  . Physical exam 10/10/2016  . Gastroparesis due to DM ([place]) 06/05/2016  . Right shoulder pain 02/03/2016  . Type 2 diabetes mellitus with diabetic neuropathy, without long-term current use of insulin ([place]) 11/07/2015  . Hyperlipidemia 11/07/2015  . Skull mass 07/02/2011  . Fibromyalgia 06/25/2011  . Essential hypertension 09/24/2007    Recent Relevant Labs: Lab Results  Component Value Date/Time   HGBA1C 7.8 (H) 06/02/2020 10:28 AM   HGBA1C 7.3 (H) 03/02/2020 11:01 AM   MICROALBUR 24.8 11/04/2014 12:00 AM    Kidney Function Lab Results  Component Value Date/Time   CREATININE 0.73 06/02/2020 10:28 AM   CREATININE 0.79 03/02/2020 11:01 AM   CREATININE 0.81 10/24/2017 03:59 PM  CREATININE 0.80 10/10/2016 04:20 PM   GFR 81.83 06/02/2020 10:28 AM   GFRNONAA >90 07/03/2011 10:06 AM   GFRAA >90 07/03/2011 10:06 AM    . Current antihyperglycemic regimen:  o Glipizide 10 mg tablet, daily o Jardiance 10 mg tablet. daily o Metformin 500 mg tablet, two tablets twice daily  . What recent interventions/DTPs have been made to improve glycemic control:  o Patient states she takes her medications as directed and works on her diet as much as she can.  . Have there been any recent  hospitalizations or ED visits since last visit with CPP? No , patient has not had any recent hospitalizations or ED visits since her last visit with Madelin Rear, Pharm.D., BCGP.  Marland Kitchen Patient denies hypoglycemic symptoms, including Pale, Sweaty, Shaky, Hungry, Nervous/irritable and Vision changes   . Patient denies hyperglycemic symptoms, including blurry vision, excessive thirst, fatigue, polyuria and weakness   . How often are you checking your blood sugar? once daily   . What are your blood sugars ranging?  o Fasting: 135, 140, 138 o Before meals: n/a o After meals: n/a o Bedtime: n/a  . During the week, how often does your blood glucose drop below 70? Never   . Are you checking your feet daily/regularly? Patient states she checks her feet regularly as advised.  Adherence Review: Is the patient currently on a STATIN medication? Yes Is the patient currently on ACE/ARB medication? Yes Does the patient have >5 day gap between last estimated fill dates? No  Patient states she turned in a patient assistance application ~8-8 months ago for medication Jardiance. Patient inquires on the status of her application as she has not received an update. I called [facility] (total time of call 1:04:38). Patient has been approved until 08/12/2021 and her medication should be shipped out sometime this week per their pharmacy. Patient is aware and has been informed of her application status.  Patient scheduled follow up telephone appointment with Madelin Rear, Pharm.D., BCGP on 10/20/2020 at 11:00 am.  April D Calhoun, [place] Pharmacist Assistant [phone]   Follow-Up:  Pharmacist Review

## 2020-09-28 ENCOUNTER — Other Ambulatory Visit: Payer: Self-pay | Admitting: Family Medicine

## 2020-09-28 DIAGNOSIS — E1140 Type 2 diabetes mellitus with diabetic neuropathy, unspecified: Secondary | ICD-10-CM

## 2020-10-20 ENCOUNTER — Ambulatory Visit (INDEPENDENT_AMBULATORY_CARE_PROVIDER_SITE_OTHER): Payer: PPO

## 2020-10-20 ENCOUNTER — Other Ambulatory Visit: Payer: Self-pay

## 2020-10-20 DIAGNOSIS — I10 Essential (primary) hypertension: Secondary | ICD-10-CM

## 2020-10-20 DIAGNOSIS — E785 Hyperlipidemia, unspecified: Secondary | ICD-10-CM | POA: Diagnosis not present

## 2020-10-20 DIAGNOSIS — E1140 Type 2 diabetes mellitus with diabetic neuropathy, unspecified: Secondary | ICD-10-CM | POA: Diagnosis not present

## 2020-10-20 NOTE — Patient Instructions (Addendum)
Ms. Howze,  Thank you for taking the time to review your medications with me today.  I have included our care plan/goals in the following pages. Please review and call me at [phone] with any questions!  Thanks! Ellin Mayhew, Pharm.D., BCGP Clinical Pharmacist [facility] [place] [phone] Patient Care Plan: [place] Plan    Problem Identified: HLD HTN DMII Obesity     Goal: Disease Management   Start Date: 10/20/2020  Expected End Date: 10/20/2021  This Visit's Progress: On track  Priority: High  Note:    Diabetes (A1c goal <7%) -Not ideally controlled  -GFR 70s-80s, gastroparesis/delayed emptying  -Current medications: Marland Kitchen Metformin 1000 mg twice daily . Glipizide XL 10 mg once daily . Jardiance 10 mg once daily  -PAP for Jardiance approved through 2022.  -Medications previously tried: Januvia?  -Recent FBG: typically 130s-140, describes two BGs in 50s noted early morning upon waking past 2-4 weeks. Occasionally getting low readings in 70s accompanied by shakes later at night.  -Some inconsistency in diet. Most days of the week has last meal ~2pm however if company or family is over will eat later at ~6-7pm about 2-3x/wk. -Denies hypoglycemic/hyperglycemic symptoms -Current exercise: minimal, may start going to gym with daughter, understands silversneakers benefit is part of insurance plan.  -Educated on A1c and blood sugar goals; Exercise goal of 150 minutes per week; Prevention and management of hypoglycemic episodes; Benefits of routine self-monitoring of blood sugar; Carbohydrate counting and/or plate method -Counseled to check feet daily and get yearly eye exams -Counseled on diet and exercise extensively Recommended to continue current medication  Consider stopping glipizide XL 10 mg and starting glipizide 5-10 mg immediate release 30 minutes before breakfast and starting Januiva 100 mg once daily.  Previously tolerated and responded well on Januvia - I am optimistic that we could get patient assistance approved on this.  Hypertension (BP goal <140/90) -Not ideally controlled -Current treatment: . Losartan 50 mg twice daily  . Amlodipine 10 mg once daily  -Also on Jardiance 10 mg once daily  -Current home readings: does not test at home  -Current dietary habits: see DM -Current exercise habits: see DM -Denies hypotensive/hypertensive symptoms -Educated on BP goals and benefits of medications for prevention of heart attack, stroke and kidney damage; Daily salt intake goal < 2300 mg; Exercise goal of 150 minutes per week; -Counseled to monitor BP at home, document, and provide log at future appointments -Counseled on diet and exercise extensively Recommended to continue current medication  Hyperlipidemia: (LDL goal < 70) -Not ideally controlled -HTN, DM, fatty liver Hx, mild aortic stenosis  -Current treatment: . Simvastatin 10 mg once daily -Medications previously tried: simvastatin 20 mg  -Current dietary patterns: see DM -Current exercise habits: see DM -Educated on Cholesterol goals;  Benefits of statin for ASCVD risk reduction; Importance of limiting foods high in cholesterol; Exercise goal of 150 minutes per week; -Counseled on diet and exercise extensively      The patient verbalized understanding of instructions provided today and agreed to receive a MyChart copy of patient instruction and/or educational materials. Telephone follow up appointment with pharmacy team member scheduled for: See next appointment with "Care Management Staff" under "What's Next" below.

## 2020-10-20 NOTE — Progress Notes (Signed)
Chronic Care Management Pharmacy Note  10/20/2020 Name:  Karen Gonzales MRN:  [id number] DOB:  Sep 14, 1945  Subjective: Karen Gonzales is an 75 y.o. year old female who is a primary patient of Tabori, Aundra Millet, MD.  The CCM team was consulted for assistance with disease management and care coordination needs.    Engaged with patient by telephone for follow up visit in response to provider referral for pharmacy case management and/or care coordination services.   Consent to Services:  The patient was given information about Chronic Care Management services, agreed to services, and gave verbal consent prior to initiation of services.  Please see initial visit note for detailed documentation.   Patient Care Team: [name], MD as PCP - General (Family Medicine) Earnie Larsson, MD as Consulting Physician (Neurosurgery) Laurence Spates, MD (Inactive) as Consulting Physician (Gastroenterology) Wallene Huh, DPM as Consulting Physician (Podiatry) Madelin Rear, [facility] as Pharmacist (Pharmacist)  Hospital visits: None in previous 6 months  Objective: Lab Results  Component Value Date   CREATININE 0.73 06/02/2020   CREATININE 0.79 03/02/2020   CREATININE 0.63 11/30/2019   GFR 81.83 06/02/2020   GFR 71.39 03/02/2020   GFRNONAA >90 07/03/2011   GFRNONAA >90 06/04/2011   Lab Results  Component Value Date   HGBA1C 7.8 (H) 06/02/2020   HGBA1C 7.3 (H) 03/02/2020   HGBA1C 8.3 (H) 11/30/2019   Last diabetic Eye exam:  Lab Results  Component Value Date/Time   HMDIABEYEEXA No Retinopathy 10/30/2019 12:00 AM    Last diabetic Foot exam: No results found for: HMDIABFOOTEX  Lab Results  Component Value Date   CHOL 158 06/02/2020   CHOL 147 11/30/2019   TRIG 180.0 (H) 06/02/2020   TRIG 199.0 (H) 11/30/2019   HDL 51.10 06/02/2020   HDL 43.70 11/30/2019   CHOLHDL 3 06/02/2020   CHOLHDL 3 11/30/2019   VLDL 36.0 06/02/2020   VLDL 39.8 11/30/2019   LDLCALC 71 06/02/2020    LDLCALC 64 11/30/2019   LDLDIRECT 104.0 04/08/2017   Hepatic Function Latest Ref Rng & Units 06/02/2020 11/30/2019 05/28/2019  Total Protein 6.0 - 8.3 g/dL 7.5 7.7 7.4  Albumin 3.5 - 5.2 g/dL 4.5 4.5 4.4  AST 0 - 37 U/L 29 44(H) 33  ALT 0 - 35 U/L 40(H) 52(H) 42(H)  Alk Phosphatase 39 - 117 U/L 51 53 57  Total Bilirubin 0.2 - 1.2 mg/dL 0.6 0.6 0.5  Bilirubin, Direct 0.0 - 0.3 mg/dL 0.1 0.1 0.1   Lab Results  Component Value Date/Time   TSH 1.76 06/02/2020 10:28 AM   TSH 1.34 11/30/2019 09:49 AM   CBC Latest Ref Rng & Units 06/02/2020 11/30/2019 05/28/2019  WBC 4.0 - 10.5 K/uL 7.5 7.8 8.0  Hemoglobin 12.0 - 15.0 g/dL 12.5 12.6 12.6  Hematocrit 36.0 - 46.0 % 37.9 38.3 38.4  Platelets 150.0 - 400.0 K/uL 215.0 218.0 211.0   No results found for: VD25OH  Clinical ASCVD: No  The 10-year ASCVD risk score Mikey Bussing DC Jr., et al., 2013) is: 35.2%   Values used to calculate the score:     Age: 17 years     Sex: Female     Is Non-Hispanic African American: No     Diabetic: Yes     Tobacco smoker: No     Systolic Blood Pressure: 646 mmHg     Is BP treated: Yes     HDL Cholesterol: 51.1 mg/dL     Total Cholesterol: 158 mg/dL    Social History  Tobacco Use  Smoking Status Never Smoker  Smokeless Tobacco Never Used   BP Readings from Last 3 Encounters:  07/05/20 (!) 142/70  06/02/20 (!) 160/70  05/14/20 (!) 164/67   Pulse Readings from Last 3 Encounters:  07/05/20 77  06/02/20 81  05/14/20 85   Wt Readings from Last 3 Encounters:  07/05/20 188 lb 9.6 oz (85.5 kg)  06/02/20 187 lb 6.4 oz (85 kg)  05/30/20 186 lb (84.4 kg)    Assessment: Review of patient past medical history, allergies, medications, health status, including review of consultants reports, laboratory and other test data, was performed as part of comprehensive evaluation and provision of chronic care management services.   SDOH:  (Social Determinants of Health) assessments and interventions performed:   CCM  Care Plan Allergies  Allergen Reactions  . Nsaids Other (See Comments)  . Reglan [Metoclopramide] Other (See Comments)    Tremor   . Estrogens Rash    Topical patch caused the rash   Medications Reviewed Today    Reviewed by Madelin Rear, [facility] (Pharmacist) on 10/20/20 at 1128  Med List Status: <None>  Medication Order Taking? Sig Documenting Provider Last Dose Status Informant  amLODipine (NORVASC) 10 MG tablet [id number]  Take 1 tablet (10 mg total) by mouth daily. [name], MD  Active   Blood Glucose Monitoring Suppl (ONE TOUCH ULTRA 2) w/Device KIT [id number]  Pt uses glucometer to check sugars twice daily. Dx E11.9 [name], MD  Active   cetirizine (ZYRTEC) 10 MG tablet [id number]  TAKE 1 TABLET BY MOUTH EVERY DAY  Patient not taking: Reported on 06/02/2020   [name], MD  Active   empagliflozin (JARDIANCE) 10 MG TABS tablet [id number]  Take 1 tablet (10 mg total) by mouth daily. [name], MD  Active   ferrous sulfate 325 (65 FE) MG tablet [id number]  TAKE 1 TABLET BY MOUTH EVERY DAY WITH BREAKFAST. PLEASE SCHEDULE FOLLOW UP FOR DIABETES  Patient not taking: Reported on 06/02/2020   [name], MD  Active   fluticasone [facility]) 50 MCG/ACT nasal spray [id number]  Place 2 sprays into both nostrils daily. [name], MD  Active   gabapentin (NEURONTIN) 300 MG capsule [id number]  TAKE 2 CAPSULES (600 MG TOTAL) BY MOUTH 3 (THREE) TIMES DAILY. [name], MD  Active   glipiZIDE (GLUCOTROL XL) 10 MG 24 hr tablet [id number]  TAKE 1 TABLET (10 MG TOTAL) BY MOUTH DAILY WITH BREAKFAST. [name], MD  Active   Lancets (ONETOUCH DELICA PLUS [id number]) [place] [id number]  USE WHEN CHECKING BLOOD SUGAR AS DIRECTED [name], MD  Active   losartan (COZAAR) 50 MG tablet [id number]  TAKE 2 TABLETS BY MOUTH EVERY DAY [name], MD  Active   MELATONIN PO [id number]  Take by mouth. [provider]  Active    meloxicam (MOBIC) 15 MG tablet [id number]   [provider]  Active   metFORMIN (GLUCOPHAGE) 500 MG tablet [id number]  Take 2 tablets (1,000 mg total) by mouth 2 (two) times daily with a meal. [name], MD  Active   omeprazole (PRILOSEC) 40 MG capsule [id number]  Take 1 capsule (40 mg total) by mouth 2 (two) times daily.  Patient taking differently: Take 40 mg by mouth daily.    [name], MD  Active   [facility] ULTRA test strip [id number]  CHECK BLOOD SUGAR 1-2 TIMES DAILY [name], MD  Active   simvastatin (  ZOCOR) 10 MG tablet [id number]  TAKE 1 TABLET BY MOUTH EVERYDAY AT BEDTIME [name], MD  Active          Patient Active Problem List   Diagnosis Date Noted  . Dental disorder 03/02/2020  . Chronic venous insufficiency 10/09/2019  . Varicose veins of left lower extremity with pain 08/31/2019  . Obesity (BMI 30-39.9) 08/31/2019  . Heart murmur 10/24/2017  . Physical exam 10/10/2016  . Gastroparesis due to DM ([place]) 06/05/2016  . Right shoulder pain 02/03/2016  . Type 2 diabetes mellitus with diabetic neuropathy, without long-term current use of insulin ([place]) 11/07/2015  . Hyperlipidemia 11/07/2015  . Skull mass 07/02/2011  . Fibromyalgia 06/25/2011  . Essential hypertension 09/24/2007   Immunization History  Administered Date(s) Administered  . Fluad Quad(high Dose 65+) 05/28/2019, 06/02/2020  . Influenza, High Dose Seasonal PF 04/28/2018  . Influenza,inj,Quad PF,6+ Mos 06/14/2015, 05/14/2016, 04/08/2017  . Pneumococcal Conjugate-13 10/23/2013  . Pneumococcal Polysaccharide-23 11/04/2014  . Tdap 03/13/2011    Conditions to be addressed/monitored: HTN, HLD and DMII  Care Plan : [place]  Updates made by Madelin Rear, [facility] since 10/20/2020 12:00 AM    Problem: HLD HTN DMII Obesity     Goal: Disease Management   Start Date: 10/20/2020  Expected End Date: 10/20/2021  This Visit's Progress: On track  Priority:  High  Note:    Diabetes (A1c goal <7%) -Not ideally controlled  -GFR 70s-80s, gastroparesis/delayed emptying  -Current medications: Marland Kitchen Metformin 1000 mg twice daily . Glipizide XL 10 mg once daily . Jardiance 10 mg once daily  -PAP for Jardiance approved through 2022.  -Medications previously tried: Januvia?  -Recent FBG: typically 130s-140, describes two BGs in 50s noted early morning upon waking past 2-4 weeks. Occasionally getting low readings in 70s accompanied by shakes later at night.  -Some inconsistency in diet. Most days of the week has last meal ~2pm however if company or family is over will eat later at ~6-7pm about 2-3x/wk. -Denies hypoglycemic/hyperglycemic symptoms -Current exercise: minimal, may start going to gym with daughter, understands silversneakers benefit is part of insurance plan.  -Educated on A1c and blood sugar goals; Exercise goal of 150 minutes per week; Prevention and management of hypoglycemic episodes; Benefits of routine self-monitoring of blood sugar; Carbohydrate counting and/or plate method -Counseled to check feet daily and get yearly eye exams -Counseled on diet and exercise extensively Recommended to continue current medication  Consider stopping glipizide XL 10 mg and starting glipizide 5-10 mg immediate release 30 minutes before breakfast and starting Januiva 100 mg once daily. Previously tolerated and responded well on Januvia - I am optimistic that we could get patient assistance approved on this.  Hypertension (BP goal <140/90) -Not ideally controlled -Current treatment: . Losartan 50 mg twice daily  . Amlodipine 10 mg once daily  -Also on Jardiance 10 mg once daily  -Current home readings: does not test at home  -Current dietary habits: see DM -Current exercise habits: see DM -Denies hypotensive/hypertensive symptoms -Educated on BP goals and benefits of medications for prevention of heart attack, stroke and kidney damage; Daily salt  intake goal < 2300 mg; Exercise goal of 150 minutes per week; -Counseled to monitor BP at home, document, and provide log at future appointments -Counseled on diet and exercise extensively Recommended to continue current medication  Hyperlipidemia: (LDL goal < 70) -Not ideally controlled -HTN, DM, fatty liver Hx, mild aortic stenosis  -Current treatment: . Simvastatin 10 mg once  daily -Medications previously tried: simvastatin 20 mg  -Current dietary patterns: see DM -Current exercise habits: see DM -Educated on Cholesterol goals;  Benefits of statin for ASCVD risk reduction; Importance of limiting foods high in cholesterol; Exercise goal of 150 minutes per week; -Counseled on diet and exercise extensively    Current Barriers:  . Suboptimal therapeutic regimen for DM  Pharmacist Clinical Goal(s):  Marland Kitchen Over the next 365 days, patient will verbalize ability to afford treatment regimen . achieve adherence to monitoring guidelines and medication adherence to achieve therapeutic efficacy . contact provider office for questions/concerns as evidenced notation of same in electronic health record through collaboration with PharmD and provider.   Interventions: . 1:1 collaboration with [name], MD regarding development and update of comprehensive plan of care as evidenced by provider attestation and co-signature . Inter-disciplinary care team collaboration (see longitudinal plan of care) . Comprehensive medication review performed; medication list updated in electronic medical record  Patient Goals/Self-Care Activities . Over the next 365 days, patient will:  - take medications as prescribed collaborate with provider on medication access solutions target a minimum of 150 minutes of moderate intensity exercise weekly  Medication Assistance: None required.  Patient affirms current coverage meets needs.  Patient's preferred pharmacy is:  [id number] [place], [place] -  [address] [place] [address]Phone: [phone]: [phone] Follow Up:  Patient agrees to Care Plan and Follow-up. [facility] Future Appointments  Date Time Provider DLyons 10/21/2020  8:30 AM TMidge Minium MD [facility]  12/01/2020 11:00 AM [facility] C[facility] PHARMACIST [facility]  06/05/2021 11:15 AM [facility] HEALTH COACH [facility]   JMadelin Rear Pharm.D., BCGP Clinical Pharmacist [facility] [place]([phone]

## 2020-10-21 ENCOUNTER — Ambulatory Visit (INDEPENDENT_AMBULATORY_CARE_PROVIDER_SITE_OTHER): Payer: PPO | Admitting: Family Medicine

## 2020-10-21 ENCOUNTER — Encounter: Payer: Self-pay | Admitting: Family Medicine

## 2020-10-21 DIAGNOSIS — E559 Vitamin D deficiency, unspecified: Secondary | ICD-10-CM | POA: Insufficient documentation

## 2020-10-21 DIAGNOSIS — Z860100 Personal history of colon polyps, unspecified: Secondary | ICD-10-CM | POA: Insufficient documentation

## 2020-10-21 DIAGNOSIS — E7800 Pure hypercholesterolemia, unspecified: Secondary | ICD-10-CM | POA: Insufficient documentation

## 2020-10-21 DIAGNOSIS — E1140 Type 2 diabetes mellitus with diabetic neuropathy, unspecified: Secondary | ICD-10-CM

## 2020-10-21 DIAGNOSIS — D509 Iron deficiency anemia, unspecified: Secondary | ICD-10-CM | POA: Insufficient documentation

## 2020-10-21 DIAGNOSIS — J309 Allergic rhinitis, unspecified: Secondary | ICD-10-CM | POA: Insufficient documentation

## 2020-10-21 DIAGNOSIS — M8580 Other specified disorders of bone density and structure, unspecified site: Secondary | ICD-10-CM | POA: Insufficient documentation

## 2020-10-21 DIAGNOSIS — K760 Fatty (change of) liver, not elsewhere classified: Secondary | ICD-10-CM | POA: Insufficient documentation

## 2020-10-21 DIAGNOSIS — R1310 Dysphagia, unspecified: Secondary | ICD-10-CM | POA: Insufficient documentation

## 2020-10-21 DIAGNOSIS — K219 Gastro-esophageal reflux disease without esophagitis: Secondary | ICD-10-CM | POA: Insufficient documentation

## 2020-10-21 LAB — BASIC METABOLIC PANEL
BUN: 18 mg/dL (ref 6–23)
CO2: 26 mEq/L (ref 19–32)
Calcium: 9.9 mg/dL (ref 8.4–10.5)
Chloride: 99 mEq/L (ref 96–112)
Creatinine, Ser: 0.79 mg/dL (ref 0.40–1.20)
GFR: 74.23 mL/min (ref >60.00)
Glucose, Bld: 127 mg/dL — ABNORMAL HIGH (ref 70–99)
Potassium: 4.2 mEq/L (ref 3.5–5.1)
Sodium: 134 mEq/L — ABNORMAL LOW (ref 135–145)

## 2020-10-21 LAB — HEMOGLOBIN A1C: Hgb A1c MFr Bld: 7.5 % — ABNORMAL HIGH (ref 4.6–6.5)

## 2020-10-21 NOTE — Progress Notes (Signed)
   Subjective:    Patient ID: Karen Gonzales, female    DOB: [birth year], 75 y.o.   MRN: [id number]  HPI DM- chronic problem, on Jardiance 44m daily, Glipizide XL 146mdaily, Metformin 100060mID.  On ARB for renal protection.  Eye exam scheduled for 11/21/20.  UTD on foot exam.  Pt reports CBGs 110s-140s.  Denies symptomatic lows.  Lasts A1C 7.8%  No CP, SOB, HAs, visual changes, edema.   Review of Systems For ROS see HPI   This visit occurred during the SARS-CoV-2 public health emergency.  Safety protocols were in place, including screening questions prior to the visit, additional usage of staff PPE, and extensive cleaning of exam room while observing appropriate contact time as indicated for disinfecting solutions.       Objective:   Physical Exam Vitals reviewed.  Constitutional:      General: She is not in acute distress.    Appearance: Normal appearance. She is well-developed. She is obese.  HENT:     Head: Normocephalic and atraumatic.  Eyes:     Conjunctiva/sclera: Conjunctivae normal.     Pupils: Pupils are equal, round, and reactive to light.  Neck:     Thyroid: No thyromegaly.  Cardiovascular:     Rate and Rhythm: Normal rate and regular rhythm.     Pulses: Normal pulses.     Heart sounds: Normal heart sounds. No murmur heard.   Pulmonary:     Effort: Pulmonary effort is normal. No respiratory distress.     Breath sounds: Normal breath sounds.  Abdominal:     General: There is no distension.     Palpations: Abdomen is soft.     Tenderness: There is no abdominal tenderness.  Musculoskeletal:     Cervical back: Normal range of motion and neck supple.     Right lower leg: No edema.     Left lower leg: No edema.  Lymphadenopathy:     Cervical: No cervical adenopathy.  Skin:    General: Skin is warm and dry.  Neurological:     Mental Status: She is alert and oriented to person, place, and time.  Psychiatric:        Behavior: Behavior normal.            Assessment & Plan:

## 2020-10-21 NOTE — Assessment & Plan Note (Signed)
Chronic problem, on Jardiance 89m, Glipizide XL 127m Metformin 100069mID.  Has eye exam scheduled for April.  UTD on foot exam.  On ARB for renal protection.  Check labs.  Adjust meds prn

## 2020-10-21 NOTE — Patient Instructions (Signed)
Schedule your complete physical in 3-4 months We'll notify you of your lab results and make any changes if needed Continue to work on healthy diet and regular exercise- you're doing great! Call with any questions or concerns Stay Safe!  Stay Healthy! Happy Spring!!!

## 2020-11-04 ENCOUNTER — Other Ambulatory Visit: Payer: Self-pay | Admitting: Family Medicine

## 2020-12-01 ENCOUNTER — Ambulatory Visit: Payer: PPO

## 2020-12-01 DIAGNOSIS — H35033 Hypertensive retinopathy, bilateral: Secondary | ICD-10-CM | POA: Diagnosis not present

## 2020-12-01 DIAGNOSIS — H524 Presbyopia: Secondary | ICD-10-CM | POA: Diagnosis not present

## 2020-12-01 DIAGNOSIS — I10 Essential (primary) hypertension: Secondary | ICD-10-CM | POA: Diagnosis not present

## 2020-12-01 DIAGNOSIS — H2513 Age-related nuclear cataract, bilateral: Secondary | ICD-10-CM | POA: Diagnosis not present

## 2020-12-01 DIAGNOSIS — H5203 Hypermetropia, bilateral: Secondary | ICD-10-CM | POA: Diagnosis not present

## 2020-12-01 NOTE — Progress Notes (Signed)
  Chronic Care Management   Outreach Note   Name: Karen Gonzales MRN: [id number] DOB: 1946-06-16  Referred by: [name], MD Reason for referral: Telephone Appointment with [facility] Pharmacist, Madelin Rear.   An unsuccessful telephone outreach was attempted today. The patient was referred to the pharmacist for assistance with care management and care coordination.   Telephone appointment with clinical pharmacist today (12/01/2020) at 1pm. If patient immediately returns call, transfer to [phone]. Otherwise, please provide this number so patient can reschedule visit.   - requested RS for next week  Madelin Rear, Pharm.D., BCGP Clinical Pharmacist [facility] [phone]

## 2020-12-08 ENCOUNTER — Telehealth: Payer: PPO

## 2020-12-08 ENCOUNTER — Ambulatory Visit: Payer: PPO

## 2020-12-15 ENCOUNTER — Other Ambulatory Visit: Payer: Self-pay | Admitting: Family Medicine

## 2020-12-15 ENCOUNTER — Ambulatory Visit (INDEPENDENT_AMBULATORY_CARE_PROVIDER_SITE_OTHER): Payer: PPO

## 2020-12-15 ENCOUNTER — Other Ambulatory Visit: Payer: Self-pay

## 2020-12-15 DIAGNOSIS — E1140 Type 2 diabetes mellitus with diabetic neuropathy, unspecified: Secondary | ICD-10-CM

## 2020-12-15 DIAGNOSIS — I10 Essential (primary) hypertension: Secondary | ICD-10-CM

## 2020-12-15 MED ORDER — METFORMIN HCL 500 MG PO TABS: 1000.0000 mg | ORAL_TABLET | Freq: Two times a day (BID) | ORAL | 1 refills | Status: DC

## 2020-12-15 MED ORDER — AMLODIPINE BESYLATE 10 MG PO TABS: 10.0000 mg | ORAL_TABLET | Freq: Every day | ORAL | 2 refills | Status: DC

## 2020-12-15 NOTE — Progress Notes (Signed)
Chronic Care Management Pharmacy Note  12/15/2020 Name:  Karen Gonzales MRN:  [id number] DOB:  Nov 26, 1945  Subjective: Karen Gonzales is an 75 y.o. year old female who is a primary patient of Tabori, Aundra Millet, MD.  The CCM team was consulted for assistance with disease management and care coordination needs.    Engaged with patient by telephone for follow up visit in response to provider referral for pharmacy case management and/or care coordination services.   Consent to Services:  The patient was given information about Chronic Care Management services, agreed to services, and gave verbal consent prior to initiation of services.  Please see initial visit note for detailed documentation.   Patient Care Team: [name], MD as PCP - General (Family Medicine) Earnie Larsson, MD as Consulting Physician (Neurosurgery) Laurence Spates, MD (Inactive) as Consulting Physician (Gastroenterology) Wallene Huh, DPM as Consulting Physician (Podiatry) Madelin Rear, [facility] as Pharmacist (Pharmacist)  Hospital visits: None in previous 6 months  Objective: Lab Results  Component Value Date   CREATININE 0.79 10/21/2020   CREATININE 0.73 06/02/2020   CREATININE 0.79 03/02/2020   GFR 74.23 10/21/2020   GFR 81.83 06/02/2020   GFRNONAA >90 07/03/2011   GFRNONAA >90 06/04/2011   Lab Results  Component Value Date   HGBA1C 7.5 (H) 10/21/2020   HGBA1C 7.8 (H) 06/02/2020   HGBA1C 7.3 (H) 03/02/2020   Last diabetic Eye exam:  Lab Results  Component Value Date/Time   HMDIABEYEEXA No Retinopathy 10/30/2019 12:00 AM    Last diabetic Foot exam: No results found for: HMDIABFOOTEX  Lab Results  Component Value Date   CHOL 158 06/02/2020   CHOL 147 11/30/2019   TRIG 180.0 (H) 06/02/2020   TRIG 199.0 (H) 11/30/2019   HDL 51.10 06/02/2020   HDL 43.70 11/30/2019   CHOLHDL 3 06/02/2020   CHOLHDL 3 11/30/2019   VLDL 36.0 06/02/2020   VLDL 39.8 11/30/2019   LDLCALC 71 06/02/2020    LDLCALC 64 11/30/2019   LDLDIRECT 104.0 04/08/2017   Hepatic Function Latest Ref Rng & Units 06/02/2020 11/30/2019 05/28/2019  Total Protein 6.0 - 8.3 g/dL 7.5 7.7 7.4  Albumin 3.5 - 5.2 g/dL 4.5 4.5 4.4  AST 0 - 37 U/L 29 44(H) 33  ALT 0 - 35 U/L 40(H) 52(H) 42(H)  Alk Phosphatase 39 - 117 U/L 51 53 57  Total Bilirubin 0.2 - 1.2 mg/dL 0.6 0.6 0.5  Bilirubin, Direct 0.0 - 0.3 mg/dL 0.1 0.1 0.1   Lab Results  Component Value Date/Time   TSH 1.76 06/02/2020 10:28 AM   TSH 1.34 11/30/2019 09:49 AM   CBC Latest Ref Rng & Units 06/02/2020 11/30/2019 05/28/2019  WBC 4.0 - 10.5 K/uL 7.5 7.8 8.0  Hemoglobin 12.0 - 15.0 g/dL 12.5 12.6 12.6  Hematocrit 36.0 - 46.0 % 37.9 38.3 38.4  Platelets 150.0 - 400.0 K/uL 215.0 218.0 211.0   No results found for: VD25OH  Clinical ASCVD: No  The 10-year ASCVD risk score Mikey Bussing DC Jr., et al., 2013) is: 25.8%   Values used to calculate the score:     Age: 89 years     Sex: Female     Is Non-Hispanic African American: No     Diabetic: Yes     Tobacco smoker: No     Systolic Blood Pressure: 485 mmHg     Is BP treated: Yes     HDL Cholesterol: 51.1 mg/dL     Total Cholesterol: 158 mg/dL    Social History  Tobacco Use  Smoking Status Never Smoker  Smokeless Tobacco Never Used   BP Readings from Last 3 Encounters:  10/21/20 118/78  07/05/20 (!) 142/70  06/02/20 (!) 160/70   Pulse Readings from Last 3 Encounters:  10/21/20 75  07/05/20 77  06/02/20 81   Wt Readings from Last 3 Encounters:  10/21/20 188 lb 6.4 oz (85.5 kg)  07/05/20 188 lb 9.6 oz (85.5 kg)  06/02/20 187 lb 6.4 oz (85 kg)    Assessment: Review of patient past medical history, allergies, medications, health status, including review of consultants reports, laboratory and other test data, was performed as part of comprehensive evaluation and provision of chronic care management services.   SDOH:  (Social Determinants of Health) assessments and interventions performed:    CCM Care Plan Allergies  Allergen Reactions  . Nsaids Other (See Comments)  . Reglan [Metoclopramide] Other (See Comments)    Tremor   . Estrogens Rash    Topical patch caused the rash  . Other Rash   Medications Reviewed Today    Reviewed by [name], MD (Physician) on 10/21/20 at [id number]  Med List Status: <None>  Medication Order Taking? Sig Documenting Provider Last Dose Status Informant  amLODipine (NORVASC) 10 MG tablet [id number] Yes Take 1 tablet (10 mg total) by mouth daily. [name], MD Taking Active   Blood Glucose Monitoring Suppl (ONE TOUCH ULTRA 2) w/Device KIT [id number] Yes Pt uses glucometer to check sugars twice daily. Dx E11.9 [name], MD Taking Active   cetirizine (ZYRTEC) 10 MG tablet [id number] No TAKE 1 TABLET BY MOUTH EVERY DAY  Patient not taking: No sig reported   [name], MD Not Taking Active   empagliflozin (JARDIANCE) 10 MG TABS tablet [id number] Yes Take 1 tablet (10 mg total) by mouth daily. [name], MD Taking Active   ferrous sulfate 325 (65 FE) MG tablet [id number] No TAKE 1 TABLET BY MOUTH EVERY DAY WITH BREAKFAST. PLEASE SCHEDULE FOLLOW UP FOR DIABETES  Patient not taking: No sig reported   [name], MD Not Taking Active   fluticasone (FLONASE) 50 MCG/ACT nasal spray [id number] Yes Place 2 sprays into both nostrils daily. [name], MD Taking Active   gabapentin (NEURONTIN) 300 MG capsule [id number] Yes TAKE 2 CAPSULES (600 MG TOTAL) BY MOUTH 3 (THREE) TIMES DAILY. [name], MD Taking Active   glipiZIDE (GLUCOTROL XL) 10 MG 24 hr tablet [id number] Yes TAKE 1 TABLET (10 MG TOTAL) BY MOUTH DAILY WITH BREAKFAST. [name], MD Taking Active   Lancets (ONETOUCH DELICA PLUS [id number]) Planada [id number] Yes USE WHEN CHECKING BLOOD SUGAR AS DIRECTED [name], MD Taking Active   losartan (COZAAR) 50 MG tablet [id number] Yes TAKE 2 TABLETS BY MOUTH EVERY DAY [name], MD Taking Active   MELATONIN PO [id number] Yes Take by mouth. [provider] Taking Active   meloxicam (MOBIC) 15 MG tablet [id number] Yes  [provider] Taking Active   metFORMIN (GLUCOPHAGE) 500 MG tablet [id number] Yes Take 2 tablets (1,000 mg total) by mouth 2 (two) times daily with a meal. [name], MD Taking Active   omeprazole (PRILOSEC) 40 MG capsule [id number] Yes Take 1 capsule (40 mg total) by mouth 2 (two) times daily.  Patient taking differently: Take 40 mg by mouth daily.   [name], MD Taking Active   [facility] ULTRA test strip [id number] Yes CHECK BLOOD SUGAR 1-2 TIMES DAILY Annye Asa  E, MD Taking Active   simvastatin (ZOCOR) 10 MG tablet [id number] Yes TAKE 1 TABLET BY MOUTH EVERYDAY AT BEDTIME [name], MD Taking Active          Patient Active Problem List   Diagnosis Date Noted  . Allergic rhinitis 10/21/2020  . Dysphagia 10/21/2020  . Fatty liver 10/21/2020  . Gastro-esophageal reflux disease without esophagitis 10/21/2020  . Iron deficiency anemia 10/21/2020  . Osteopenia 10/21/2020  . Personal history of colonic polyps 10/21/2020  . Pure hypercholesterolemia 10/21/2020  . Vitamin D deficiency 10/21/2020  . Dental disorder 03/02/2020  . Chronic venous insufficiency 10/09/2019  . Varicose veins of left lower extremity with pain 08/31/2019  . Obesity (BMI 30-39.9) 08/31/2019  . Heart murmur 10/24/2017  . Physical exam 10/10/2016  . Gastroparesis due to DM ([place]) 06/05/2016  . Right shoulder pain 02/03/2016  . Type 2 diabetes mellitus with diabetic neuropathy, without long-term current use of insulin ([place]) 11/07/2015  . Hyperlipidemia 11/07/2015  . Skull mass 07/02/2011  . Fibromyalgia 06/25/2011  . Essential hypertension 09/24/2007   Immunization History  Administered Date(s) Administered  . Fluad Quad(high Dose 65+) 05/28/2019, 06/02/2020  . Influenza Split 05/30/2009, 05/09/2010,  05/29/2013  . Influenza, High Dose Seasonal PF 07/16/2014, 06/14/2015, 04/28/2018  . Influenza,inj,Quad PF,6+ Mos 05/09/2011, 06/14/2015, 05/14/2016, 04/08/2017  . Pneumococcal Conjugate-13 10/23/2013  . Pneumococcal Polysaccharide-23 11/04/2014  . Tdap 02/27/2010, 03/13/2011    Conditions to be addressed/monitored: HTN, HLD and DMII  Care Plan : [place]  Updates made by Madelin Rear, [facility] since 12/15/2020 12:00 AM    Problem: HLD HTN DMII Obesity     Goal: Disease Management   Start Date: 10/20/2020  Expected End Date: 10/20/2021  Recent Progress: On track  Priority: High  Note:   Current Barriers:  . Suboptimal therapeutic regimen for DM  Pharmacist Clinical Goal(s):  Marland Kitchen Over the next 365 days, patient will verbalize ability to afford treatment regimen . achieve adherence to monitoring guidelines and medication adherence to achieve therapeutic efficacy . contact provider office for questions/concerns as evidenced notation of same in electronic health record through collaboration with PharmD and provider.   Interventions: . 1:1 collaboration with [name], MD regarding development and update of comprehensive plan of care as evidenced by provider attestation and co-signature . Inter-disciplinary care team collaboration (see longitudinal plan of care) . Comprehensive medication review performed; medication list updated in electronic medical record  Patient Goals/Self-Care Activities . Over the next 365 days, patient will:  - take medications as prescribed collaborate with provider on medication access solutions target a minimum of 150 minutes of moderate intensity exercise weekly  Diabetes (A1c goal <7%) --GFR 70s-80s, gastroparesis/delayed emptying  -Current medications: Marland Kitchen Metformin 1000 mg twice daily . Glipizide XL 10 mg once daily . Jardiance 10 mg once daily  -PAP for Jardiance approved through 2022.  -Recent FBG: range of 118-180 reported over  past week. Was thought to have low BG based on previous conversation with patient but this has since been ruled out. Reports some late night snacking and mentions zebra cakes as recent go to snack.  -Denies hypoglycemic/hyperglycemic symptoms -Current exercise: has not gotten into [employer] yet due to caring for grandchildren and daughter following foot surgery - intending on giving silver sneakers a call.   -Counseled on diet and exercise extensively Recommended to continue current medication  Will pursue PAP on Januvia, patient is agreeable starting if approved and understands this would be a medication  addition rather than replacement.   Hypertension (BP goal <140/90) -Controlled  -Current treatment: . Losartan 50 mg twice daily  . Amlodipine 10 mg once daily  -Also on Jardiance 10 mg once daily  -Current home readings: does not test at home. Diet: drinking a lot of icewater throughout the day.   -Mentions some increased SOB while doing yard work or while going up stairs - feels this has been a progressive change. No SOB at rest or while walking. No chest pain/discomfort.  -Counseled to monitor BP at home, document, and provide log at future appointments -Counseled on diet and exercise extensively Recommended to continue current medication    Medication Assistance: None required.  Patient affirms current coverage meets needs.  Patient's preferred pharmacy is: [id number] [place], [place] - [address] [place] [address]Phone: [phone]: [phone] Up:  Patient agrees to Care Plan and Follow-up. Plan: CPA f/u call to check on Januvia PAP, diet progress.   Future Appointments  Date Time Provider DRobbins 02/08/2021  8:00 AM TMidge Minium MD [facility]  02/22/2021  2:00 PM [facility] CCM PHARMACIST [facility]  06/05/2021 11:15 AM [facility] HEALTH COACH [facility]   JMadelin Rear Pharm.D., BCGP Clinical Pharmacist [facility] [place]([phone]

## 2020-12-15 NOTE — Patient Instructions (Signed)
Karen Gonzales,  Thank you for talking with me today. I have included our care plan/goals in the following pages.   Please review and call me at [phone] with any questions.  Thanks! Ellin Mayhew, Pharm.D., BCGP Clinical Pharmacist [facility] [place] [phone] Patient Care Plan: [place] Plan    Problem Identified: HLD HTN DMII Obesity     Goal: Disease Management   Start Date: 10/20/2020  Expected End Date: 10/20/2021  Recent Progress: On track  Priority: High  Note:   Current Barriers:  . Suboptimal therapeutic regimen for DM  Pharmacist Clinical Goal(s):  Marland Kitchen Over the next 365 days, patient will verbalize ability to afford treatment regimen . achieve adherence to monitoring guidelines and medication adherence to achieve therapeutic efficacy . contact provider office for questions/concerns as evidenced notation of same in electronic health record through collaboration with PharmD and provider.   Interventions: . 1:1 collaboration with [name], MD regarding development and update of comprehensive plan of care as evidenced by provider attestation and co-signature . Inter-disciplinary care team collaboration (see longitudinal plan of care) . Comprehensive medication review performed; medication list updated in electronic medical record  Patient Goals/Self-Care Activities . Over the next 365 days, patient will:  - take medications as prescribed collaborate with provider on medication access solutions target a minimum of 150 minutes of moderate intensity exercise weekly  Diabetes (A1c goal <7%) --GFR 70s-80s, gastroparesis/delayed emptying  -Current medications: Marland Kitchen Metformin 1000 mg twice daily . Glipizide XL 10 mg once daily . Jardiance 10 mg once daily  -PAP for Jardiance approved through 2022.  -Recent FBG: range of 118-180 reported over past week. Was thought to have low BG based on previous  conversation with patient but this has since been ruled out. Reports some late night snacking and mentions zebra cakes as recent go to snack.  -Denies hypoglycemic/hyperglycemic symptoms -Current exercise: has not gotten into [employer] yet due to caring for grandchildren and daughter following foot surgery - intending on giving silver sneakers a call.   -Counseled on diet and exercise extensively Recommended to continue current medication  Will pursue PAP on Januvia, patient is agreeable starting if approved and understands this would be a medication addition rather than replacement.   Hypertension (BP goal <140/90) -Controlled  -Current treatment: . Losartan 50 mg twice daily  . Amlodipine 10 mg once daily  -Also on Jardiance 10 mg once daily  -Current home readings: does not test at home. Diet: drinking a lot of icewater throughout the day.   -Mentions some increased SOB while doing yard work or while going up stairs - feels this has been a progressive change. No SOB at rest or while walking. No chest pain/discomfort.  -Counseled to monitor BP at home, document, and provide log at future appointments -Counseled on diet and exercise extensively Recommended to continue current medication      The patient verbalized understanding of instructions provided today and agreed to receive a MyChart copy of patient instruction and/or educational materials. Telephone follow up appointment with pharmacy team member scheduled for: See next appointment with "Care Management Staff" under "What's Next" below.

## 2020-12-16 ENCOUNTER — Telehealth: Payer: Self-pay

## 2020-12-16 NOTE — Telephone Encounter (Signed)
-----   Message from Madelin Rear, [facility] sent at 12/15/2020 11:43 AM EDT ----- Can you fill out PAP application for januvia 100 mg once daily and call patient once sending please?

## 2020-12-16 NOTE — Chronic Care Management (AMB) (Addendum)
    Chronic Care Management Pharmacy Assistant   Name: Karen Gonzales  MRN: [id number] DOB: 1945/10/25  Reason for Encounter: Patient Assistance Documentation  Medications: Outpatient Encounter Medications as of 12/16/2020  Medication Sig   amLODipine (NORVASC) 10 MG tablet Take 1 tablet (10 mg total) by mouth daily.   Blood Glucose Monitoring Suppl (ONE TOUCH ULTRA 2) w/Device KIT Pt uses glucometer to check sugars twice daily. Dx E11.9   empagliflozin (JARDIANCE) 10 MG TABS tablet Take 1 tablet (10 mg total) by mouth daily.   fluticasone (FLONASE) 50 MCG/ACT nasal spray Place 2 sprays into both nostrils daily.   gabapentin (NEURONTIN) 300 MG capsule TAKE 2 CAPSULES (600 MG TOTAL) BY MOUTH 3 (THREE) TIMES DAILY.   glipiZIDE (GLUCOTROL XL) 10 MG 24 hr tablet TAKE 1 TABLET (10 MG TOTAL) BY MOUTH DAILY WITH BREAKFAST.   Lancets (ONETOUCH DELICA PLUS [id number]) MISC USE WHEN CHECKING BLOOD SUGAR AS DIRECTED   losartan (COZAAR) 50 MG tablet TAKE 2 TABLETS BY MOUTH EVERY DAY   MELATONIN PO Take by mouth.   meloxicam (MOBIC) 15 MG tablet    metFORMIN (GLUCOPHAGE) 500 MG tablet Take 2 tablets (1,000 mg total) by mouth 2 (two) times daily with a meal.   omeprazole (PRILOSEC) 40 MG capsule Take 1 capsule (40 mg total) by mouth 2 (two) times daily. (Patient taking differently: Take 40 mg by mouth daily.)   ONETOUCH ULTRA test strip CHECK BLOOD SUGAR 1-2 TIMES DAILY   simvastatin (ZOCOR) 10 MG tablet TAKE 1 TABLET BY MOUTH EVERYDAY AT BEDTIME   No facility-administered encounter medications on file as of 12/16/2020.   Prepared and reviewed patient assistance application for Januvia 100 mg  Called and informed patient that I will be mailing her application for Januvia today 05/06. She provided an alternate [place],  for the receipt of these items. She is aware this is to be completed and returned to PCP's office.  Wilford Sports CPA, CMA

## 2021-01-04 NOTE — Telephone Encounter (Signed)
Januvia Patient Assistance application completed and sent in mail

## 2021-01-16 ENCOUNTER — Telehealth: Payer: Self-pay

## 2021-01-16 NOTE — Chronic Care Management (AMB) (Signed)
Chronic Care Management Pharmacy Assistant   Name: Karen Gonzales  MRN: [id number] DOB: [birth year]  Reason for Encounter: General Patient Call  Recent office visits:  No visits noted   Recent consult visits:  No visits noted  Hospital visits:  None in previous 6 months  Medications: Outpatient Encounter Medications as of 01/16/2021  Medication Sig  . amLODipine (NORVASC) 10 MG tablet Take 1 tablet (10 mg total) by mouth daily.  . Blood Glucose Monitoring Suppl (ONE TOUCH ULTRA 2) w/Device KIT Pt uses glucometer to check sugars twice daily. Dx E11.9  . empagliflozin (JARDIANCE) 10 MG TABS tablet Take 1 tablet (10 mg total) by mouth daily.  . fluticasone (FLONASE) 50 MCG/ACT nasal spray Place 2 sprays into both nostrils daily.  Marland Kitchen gabapentin (NEURONTIN) 300 MG capsule TAKE 2 CAPSULES (600 MG TOTAL) BY MOUTH 3 (THREE) TIMES DAILY.  Marland Kitchen glipiZIDE (GLUCOTROL XL) 10 MG 24 hr tablet TAKE 1 TABLET (10 MG TOTAL) BY MOUTH DAILY WITH BREAKFAST.  Marland Kitchen Lancets (ONETOUCH DELICA PLUS [id number]) MISC USE WHEN CHECKING BLOOD SUGAR AS DIRECTED  . losartan (COZAAR) 50 MG tablet TAKE 2 TABLETS BY MOUTH EVERY DAY  . MELATONIN PO Take by mouth.  . meloxicam (MOBIC) 15 MG tablet   . metFORMIN (GLUCOPHAGE) 500 MG tablet Take 2 tablets (1,000 mg total) by mouth 2 (two) times daily with a meal.  . omeprazole (PRILOSEC) 40 MG capsule Take 1 capsule (40 mg total) by mouth 2 (two) times daily. (Patient taking differently: Take 40 mg by mouth daily.)  . ONETOUCH ULTRA test strip CHECK BLOOD SUGAR 1-2 TIMES DAILY  . simvastatin (ZOCOR) 10 MG tablet TAKE 1 TABLET BY MOUTH EVERYDAY AT BEDTIME   No facility-administered encounter medications on file as of 01/16/2021.   I spoke with [employer] patient assistance for Januvia and was informed that the application was received but is missing an Passenger transport manager and provider state license information. A letter has been mailed out to the patient with request for the attestation. The  missing state license information may be faxed at the earliest convenience.  I spoke with [name] and she is doing well. At the time if our conversation she had not yet received the letter from [facility]. I informed her of all necessary information given to me by [facility], and gave her instructions for completing the process.   Wilford Sports CPA,CMA

## 2021-01-25 NOTE — Chronic Care Management (AMB) (Signed)
    Chronic Care Management Pharmacy Assistant   Name: RYAH CRIBB  MRN: [id number] DOB: 10/19/1945  Marzetta Board at [place] patient assistance for [place], informed me that Ms. Denner's application was approved and will be active for the rest of the year.  I left a message with Ms. Fero's daughter informing her of the approval. She is aware that her shipment will be received through the mail in 7-10 business days.   Wilford Sports CPA, CMA

## 2021-02-06 ENCOUNTER — Telehealth (INDEPENDENT_AMBULATORY_CARE_PROVIDER_SITE_OTHER): Payer: PPO | Admitting: Registered Nurse

## 2021-02-06 ENCOUNTER — Other Ambulatory Visit: Payer: Self-pay

## 2021-02-06 ENCOUNTER — Encounter: Payer: Self-pay | Admitting: Registered Nurse

## 2021-02-06 DIAGNOSIS — J22 Unspecified acute lower respiratory infection: Secondary | ICD-10-CM | POA: Diagnosis not present

## 2021-02-06 MED ORDER — PREDNISONE 20 MG PO TABS: 20.0000 mg | ORAL_TABLET | Freq: Every day | ORAL | 0 refills | Status: DC

## 2021-02-06 MED ORDER — HYDROCODONE BIT-HOMATROP MBR 5-1.5 MG/5ML PO SOLN
5.0000 mL | Freq: Every evening | ORAL | 0 refills | Status: DC | PRN
Start: 2021-02-06 — End: 2021-03-22

## 2021-02-06 MED ORDER — AZITHROMYCIN 250 MG PO TABS: ORAL_TABLET | ORAL | 0 refills | Status: AC

## 2021-02-06 MED ORDER — BENZONATATE 200 MG PO CAPS: 200.0000 mg | ORAL_CAPSULE | Freq: Two times a day (BID) | ORAL | 0 refills | Status: DC | PRN

## 2021-02-06 NOTE — Patient Instructions (Signed)
° ° ° °  If you have lab work done today you will be contacted with your lab results within the next 2 weeks.  If you have not heard from us then please contact us. The fastest way to get your results is to register for My Chart. ° ° °IF you received an x-ray today, you will receive an invoice from [facility]. Please contact [facility] at [phone] with questions or concerns regarding your invoice.  ° °IF you received labwork today, you will receive an invoice from [facility]. Please contact [facility] at [phone] with questions or concerns regarding your invoice.  ° °Our billing staff will not be able to assist you with questions regarding bills from these companies. ° °You will be contacted with the lab results as soon as they are available. The fastest way to get your results is to activate your My Chart account. Instructions are located on the last page of this paperwork. If you have not heard from us regarding the results in 2 weeks, please contact this office. °  ° ° ° °

## 2021-02-06 NOTE — Progress Notes (Signed)
Telemedicine Encounter- SOAP NOTE Established Patient  This telephone encounter was conducted with the patient's (or proxy's) verbal consent via audio telecommunications: yes/no: Yes Patient was instructed to have this encounter in a suitably private space; and to only have persons present to whom they give permission to participate. In addition, patient identity was confirmed by use of name plus two identifiers (DOB and address).  I discussed the limitations, risks, security and privacy concerns of performing an evaluation and management service by telephone and the availability of in person appointments. I also discussed with the patient that there may be a patient responsible charge related to this service. The patient expressed understanding and agreed to proceed.  I spent a total of TIME; 0 MIN TO 60 MIN: 15 minutes talking with the patient or their proxy.  Patient at home Provider in office  Participants: Kathrin Ruddy, NP and [name]  Chief Complaint  Patient presents with   Cough    Patient srates she has been experiencing a cough that gets worse at night, congestion and fatigue for a week. She has been taking mucinex and other Otc medications that has not seemed to help at this time. Patient has took a covid test that was negative     Subjective   Karen Gonzales is a 75 y.o. established patient. Telephone visit today for cough  HPI Onset around 7-8 days ago Home test for covid negative on Saturday No sick contacts No fevers, chills, sweats No sensory changes Some pressure in chest when coughing a lot Otherwise no chest pain, palpitations, dependent edema  Cough with yellow mucus Fatigue - sleeping an extra 4 hours each night Hx of frequent bronchitis and lower respiratory infections  Sugars past few days have run 117-136  Patient Active Problem List   Diagnosis Date Noted   Allergic rhinitis 10/21/2020   Dysphagia 10/21/2020   Fatty liver 10/21/2020    Gastro-esophageal reflux disease without esophagitis 10/21/2020   Iron deficiency anemia 10/21/2020   Osteopenia 10/21/2020   Personal history of colonic polyps 10/21/2020   Pure hypercholesterolemia 10/21/2020   Vitamin D deficiency 10/21/2020   Dental disorder 03/02/2020   Chronic venous insufficiency 10/09/2019   Varicose veins of left lower extremity with pain 08/31/2019   Obesity (BMI 30-39.9) 08/31/2019   Heart murmur 10/24/2017   Physical exam 10/10/2016   Gastroparesis due to DM ([place]) 06/05/2016   Right shoulder pain 02/03/2016   Type 2 diabetes mellitus with diabetic neuropathy, without long-term current use of insulin ([place]) 11/07/2015   Hyperlipidemia 11/07/2015   Skull mass 07/02/2011   Fibromyalgia 06/25/2011   Essential hypertension 09/24/2007    Past Medical History:  Diagnosis Date   Anti-cardiolipin antibody syndrome ([place]) 06/25/2011   Arthritis    Blood transfusion    had when knee was replaced   Bronchitis    history of   Chills    Complication of anesthesia    Cough    COVID-19    Diabetes mellitus    DVT (deep venous thrombosis) ([place]) 06/25/2011   Fatty liver disease, nonalcoholic    determinde by Ultrasound 11/11   GERD (gastroesophageal reflux disease)    H/O vitamin D deficiency    Heart murmur    Heart murmur    Benign   Hiatal hernia    Hyperlipidemia    Hypertension    Hyponatremia    with diuretic use   Osteopenia    PONV (postoperative nausea and vomiting)  Vertigo     Current Outpatient Medications  Medication Sig Dispense Refill   amLODipine (NORVASC) 10 MG tablet Take 1 tablet (10 mg total) by mouth daily. 30 tablet 2   Blood Glucose Monitoring Suppl (ONE TOUCH ULTRA 2) w/Device KIT Pt uses glucometer to check sugars twice daily. Dx E11.9 1 each 0   empagliflozin (JARDIANCE) 10 MG TABS tablet Take 1 tablet (10 mg total) by mouth daily. 90 tablet 1   fluticasone (FLONASE) 50 MCG/ACT nasal spray Place 2 sprays into both nostrils  daily. 16 g 6   gabapentin (NEURONTIN) 300 MG capsule TAKE 2 CAPSULES (600 MG TOTAL) BY MOUTH 3 (THREE) TIMES DAILY. 270 capsule 1   glipiZIDE (GLUCOTROL XL) 10 MG 24 hr tablet TAKE 1 TABLET (10 MG TOTAL) BY MOUTH DAILY WITH BREAKFAST. 90 tablet 1   Lancets (ONETOUCH DELICA PLUS [id number]) MISC USE WHEN CHECKING BLOOD SUGAR AS DIRECTED 100 each 4   losartan (COZAAR) 50 MG tablet TAKE 2 TABLETS BY MOUTH EVERY DAY 180 tablet 1   MELATONIN PO Take by mouth.     meloxicam (MOBIC) 15 MG tablet      metFORMIN (GLUCOPHAGE) 500 MG tablet Take 2 tablets (1,000 mg total) by mouth 2 (two) times daily with a meal. 360 tablet 1   omeprazole (PRILOSEC) 40 MG capsule Take 1 capsule (40 mg total) by mouth 2 (two) times daily. (Patient taking differently: Take 40 mg by mouth daily.) 60 capsule 1   ONETOUCH ULTRA test strip CHECK BLOOD SUGAR 1-2 TIMES DAILY 100 strip 12   simvastatin (ZOCOR) 10 MG tablet TAKE 1 TABLET BY MOUTH EVERYDAY AT BEDTIME 90 tablet 2   No current facility-administered medications for this visit.    Allergies  Allergen Reactions   Nsaids Other (See Comments)   Reglan [Metoclopramide] Other (See Comments)    Tremor    Estrogens Rash    Topical patch caused the rash   Other Rash    Social History   Socioeconomic History   Marital status: Married    Spouse name: Not on file   Number of children: Not on file   Years of education: Not on file   Highest education level: Not on file  Occupational History   Not on file  Tobacco Use   Smoking status: Never   Smokeless tobacco: Never  Vaping Use   Vaping Use: Never used  Substance and Sexual Activity   Alcohol use: No   Drug use: No   Sexual activity: Not on file  Other Topics Concern   Not on file  Social History Narrative   Not on file   Social Determinants of Health   Financial Resource Strain: Low Risk    Difficulty of Paying Living Expenses: Not hard at all  Food Insecurity: Not on file  Transportation Needs:  Not on file  Physical Activity: Inactive   Days of Exercise per Week: 0 days   Minutes of Exercise per Session: 0 min  Stress: No Stress Concern Present   Feeling of Stress : Not at all  Social Connections: Socially Integrated   Frequency of Communication with Friends and Family: More than three times a week   Frequency of Social Gatherings with Friends and Family: Once a week   Attends Religious Services: More than 4 times per year   Active Member of [employer] or Organizations: Yes   Attends Archivist Meetings: 1 to 4 times per year   Marital Status: Married  Human resources officer  Violence: Not At Risk   Fear of Current or Ex-Partner: No   Emotionally Abused: No   Physically Abused: No   Sexually Abused: No    ROS Per hpi, otherwise negative  Objective   Vitals as reported by the patient: There were no vitals filed for this visit.  Twilia was seen today for cough.  Diagnoses and all orders for this visit:  Lower respiratory infection -     azithromycin (ZITHROMAX) 250 MG tablet; Take 2 tablets on day 1, then 1 tablet daily on days 2 through 5 -     predniSONE (DELTASONE) 20 MG tablet; Take 1 tablet (20 mg total) by mouth daily with breakfast. -     benzonatate (TESSALON) 200 MG capsule; Take 1 capsule (200 mg total) by mouth 2 (two) times daily as needed for cough. -     HYDROcodone bit-homatropine (HYCODAN) 5-1.5 MG/5ML syrup; Take 5 mLs by mouth at bedtime as needed for cough.  PLAN Prednisone burst, z pack Tessalon for daytime cough relief, hycodan before bed prn Repeat covid test if symptoms persist Discussed ER precautions Patient encouraged to call clinic with any questions, comments, or concerns.  I discussed the assessment and treatment plan with the patient. The patient was provided an opportunity to ask questions and all were answered. The patient agreed with the plan and demonstrated an understanding of the instructions.   The patient was advised to call  back or seek an in-person evaluation if the symptoms worsen or if the condition fails to improve as anticipated.  I provided 15 minutes of non-face-to-face time during this encounter.  Maximiano Coss, NP  Primary Care at [facility]

## 2021-02-07 ENCOUNTER — Telehealth: Payer: Self-pay

## 2021-02-07 NOTE — Telephone Encounter (Signed)
Pt is coming in to check levels in sugar tomorrow but she has a virtual appt yesterday with cough and congestion and put her on benzonatate (TESSALON) 200 MG capsule , azithromycin (ZITHROMAX) 250 MG tablet , predniSONE (DELTASONE) 20 MG tablet. Pt has not had a fever and did a at home Covid Test Saturday and it was negative she is wanting to know if Dr Birdie Riddle wants to see since she is taking all that meds if she wanted to rescedule her appt for tomorrow?   Pt call back [phone]

## 2021-02-07 NOTE — Telephone Encounter (Signed)
Since the appt to check sugars is not urgent, I would have her reschedule until she is feeling better

## 2021-02-07 NOTE — Telephone Encounter (Signed)
Is it ok for patient to keep her appt for tomorrow?

## 2021-02-07 NOTE — Telephone Encounter (Signed)
Called patient to inform. Patient will call back to reschedule.

## 2021-02-08 ENCOUNTER — Encounter: Payer: PPO | Admitting: Family Medicine

## 2021-02-08 ENCOUNTER — Encounter: Payer: Self-pay | Admitting: *Deleted

## 2021-02-08 NOTE — Chronic Care Management (AMB) (Signed)
    Chronic Care Management Pharmacy Assistant   Name: Karen Gonzales  MRN: [id number] DOB: March 26, 1946  Reason for Encounter: Chart Review   Medications: Outpatient Encounter Medications as of 01/16/2021  Medication Sig   amLODipine (NORVASC) 10 MG tablet Take 1 tablet (10 mg total) by mouth daily.   Blood Glucose Monitoring Suppl (ONE TOUCH ULTRA 2) w/Device KIT Pt uses glucometer to check sugars twice daily. Dx E11.9   empagliflozin (JARDIANCE) 10 MG TABS tablet Take 1 tablet (10 mg total) by mouth daily.   fluticasone (FLONASE) 50 MCG/ACT nasal spray Place 2 sprays into both nostrils daily.   gabapentin (NEURONTIN) 300 MG capsule TAKE 2 CAPSULES (600 MG TOTAL) BY MOUTH 3 (THREE) TIMES DAILY.   glipiZIDE (GLUCOTROL XL) 10 MG 24 hr tablet TAKE 1 TABLET (10 MG TOTAL) BY MOUTH DAILY WITH BREAKFAST.   Lancets (ONETOUCH DELICA PLUS [id number]) MISC USE WHEN CHECKING BLOOD SUGAR AS DIRECTED   losartan (COZAAR) 50 MG tablet TAKE 2 TABLETS BY MOUTH EVERY DAY   MELATONIN PO Take by mouth.   meloxicam (MOBIC) 15 MG tablet    metFORMIN (GLUCOPHAGE) 500 MG tablet Take 2 tablets (1,000 mg total) by mouth 2 (two) times daily with a meal.   omeprazole (PRILOSEC) 40 MG capsule Take 1 capsule (40 mg total) by mouth 2 (two) times daily. (Patient taking differently: Take 40 mg by mouth daily.)   ONETOUCH ULTRA test strip CHECK BLOOD SUGAR 1-2 TIMES DAILY   simvastatin (ZOCOR) 10 MG tablet TAKE 1 TABLET BY MOUTH EVERYDAY AT BEDTIME   No facility-administered encounter medications on file as of 01/16/2021.   Reviewed chart for medication changes and adherence.   Recent OV, Consult or Hospital visit:  02/06/21- Maximiano Coss, NP Telemedicine- Seen for cough   Recent medication changes indicated:  02/06/21- Maximiano Coss, NP- started hycodan 5 mL prn, started benzonatate 200 mg prn, short course prednisone 20 mg x 5 days, short course azithromycin 250 mg   No gaps in adherence identified. Patient has  follow up scheduled with pharmacy team. No further action required.  Wilford Sports CPA, CMA

## 2021-02-20 ENCOUNTER — Telehealth: Payer: Self-pay

## 2021-02-20 NOTE — Chronic Care Management (AMB) (Signed)
    Chronic Care Management Pharmacy Assistant   Name: Karen Gonzales  MRN: [id number] DOB: August 03, 1946  Reason for Encounter: General Adherence/ CPP Visit Reschedule  Recent office visits:  02/06/21- Karen Coss, NP (Telemedicine)- seen for lower respiratory infection, short course azithromycin 250 mg, short course prednisone 20 mg x 5 days, started benzonatate 200 mg bid prn, started hydrocodone bit- homatrop 5- 1.5 mg/ 5 mL, no follow up documented   Recent consult visits:  No visits noted   Hospital visits:  None in previous 6 months  Medications: Outpatient Encounter Medications as of 02/20/2021  Medication Sig   amLODipine (NORVASC) 10 MG tablet Take 1 tablet (10 mg total) by mouth daily.   benzonatate (TESSALON) 200 MG capsule Take 1 capsule (200 mg total) by mouth 2 (two) times daily as needed for cough.   Blood Glucose Monitoring Suppl (ONE TOUCH ULTRA 2) w/Device KIT Pt uses glucometer to check sugars twice daily. Dx E11.9   empagliflozin (JARDIANCE) 10 MG TABS tablet Take 1 tablet (10 mg total) by mouth daily.   fluticasone (FLONASE) 50 MCG/ACT nasal spray Place 2 sprays into both nostrils daily.   gabapentin (NEURONTIN) 300 MG capsule TAKE 2 CAPSULES (600 MG TOTAL) BY MOUTH 3 (THREE) TIMES DAILY.   glipiZIDE (GLUCOTROL XL) 10 MG 24 hr tablet TAKE 1 TABLET (10 MG TOTAL) BY MOUTH DAILY WITH BREAKFAST.   HYDROcodone bit-homatropine (HYCODAN) 5-1.5 MG/5ML syrup Take 5 mLs by mouth at bedtime as needed for cough.   Lancets (ONETOUCH DELICA PLUS [id number]) MISC USE WHEN CHECKING BLOOD SUGAR AS DIRECTED   losartan (COZAAR) 50 MG tablet TAKE 2 TABLETS BY MOUTH EVERY DAY   MELATONIN PO Take by mouth.   meloxicam (MOBIC) 15 MG tablet    metFORMIN (GLUCOPHAGE) 500 MG tablet Take 2 tablets (1,000 mg total) by mouth 2 (two) times daily with a meal.   omeprazole (PRILOSEC) 40 MG capsule Take 1 capsule (40 mg total) by mouth 2 (two) times daily. (Patient taking differently: Take 40 mg  by mouth daily.)   ONETOUCH ULTRA test strip CHECK BLOOD SUGAR 1-2 TIMES DAILY   predniSONE (DELTASONE) 20 MG tablet Take 1 tablet (20 mg total) by mouth daily with breakfast.   simvastatin (ZOCOR) 10 MG tablet TAKE 1 TABLET BY MOUTH EVERYDAY AT BEDTIME   No facility-administered encounter medications on file as of 02/20/2021.   I spoke with [name] this morning and she is doing well. She had bronchitis a couple weeks ago and was unable to go to her appointment with her PCP. During that appointment she wanted to do medication reconciliation for her DM maintenance medications. She specifically would like to know if it is necessary to take 4 tabs of Metformin daily along with the use of Glipizide and Jardiance. She was approved for Jardiance patient assistance on 06/14 and will be receiving assistance for the rest of the year.   BS readings : 90,92,115-120,140 She experiences shakiness and hunger with lower BS readings and uses life savers and orange slices candy to regulate her sugar back to normal  Hyperglycemic Adherence Review: Karen Gonzales- [place] patient assistance Glipizide 10 mg- 90 DS last filled 12/15/20 Metformin 500 mg- 90 DS last filled 02/11/21  Star Rating Drugs: Simvastatin 10 mg- 90 DS last filled 12/07/20 Losartan 50 mg - 90 DS last filled 10/31/20  Original appointment 07/13  at 2 pm Rescheduled phone visit for 07/20 at 8:30 am   [place], CMA

## 2021-02-21 ENCOUNTER — Telehealth: Payer: Self-pay

## 2021-02-21 NOTE — Chronic Care Management (AMB) (Signed)
    Chronic Care Management Pharmacy Assistant   Name: Karen Gonzales  MRN: [id number] DOB: 23-Aug-1945  Reason for Encounter: Reschedule Appointment   Patient rescheduled her follow up telephone appointment with Madelin Rear, CPP for Wednesday August 10th at 8:30 am.  April Daiva Huge, [place] Pharmacist Assistant [phone]

## 2021-02-22 ENCOUNTER — Other Ambulatory Visit: Payer: Self-pay | Admitting: Family Medicine

## 2021-02-22 ENCOUNTER — Telehealth: Payer: PPO

## 2021-03-06 NOTE — Chronic Care Management (AMB) (Addendum)
I called and spoke with the patient. She states she had a low reading today of 71. I informed patient to decrease her Glipizide to 5 mg a day instead of 10 mg per Madelin Rear, CPP. Patient is aware and verbally understands.  Patient has a follow up appointment scheduled on 03/15/21 - she states she will discuss how she is doing on 5 mg of Glipizide on this day.  Future Appointments  Date Time Provider [place]  03/15/2021 10:00 AM [facility] CCM PHARMACIST [facility]  05/23/2021 10:30 AM [name], MD [facility]  06/05/2021 11:15 AM [facility] HEALTH COACH [facility]    April D Calhoun, [place] Pharmacist Assistant [phone]

## 2021-03-13 ENCOUNTER — Other Ambulatory Visit: Payer: Self-pay | Admitting: Family Medicine

## 2021-03-13 DIAGNOSIS — I10 Essential (primary) hypertension: Secondary | ICD-10-CM

## 2021-03-15 ENCOUNTER — Telehealth: Payer: PPO

## 2021-03-15 ENCOUNTER — Telehealth: Payer: Self-pay

## 2021-03-15 NOTE — Progress Notes (Deleted)
Chronic Care Management Pharmacy Note  03/15/2021 Name:  Karen Gonzales MRN:  [id number] DOB:  08-07-1946  Summary:  Recommendations/Changes made from today's visit:  Plan:  Subjective: Karen Gonzales is an 75 y.o. year old female who is a primary patient of Tabori, Aundra Millet, MD.  The CCM team was consulted for assistance with disease management and care coordination needs.    {CCMTELEPHONEFACETOFACE:21091510} for {CCMINITIALFOLLOWUPCHOICE:21091511} in response to provider referral for pharmacy case management and/or care coordination services.   Consent to Services:  {CCMCONSENTOPTIONS:25074}  Patient Care Team: [name], MD as PCP - General (Family Medicine) Earnie Larsson, MD as Consulting Physician (Neurosurgery) Laurence Spates, MD (Inactive) as Consulting Physician (Gastroenterology) Wallene Huh, DPM as Consulting Physician (Podiatry) Madelin Rear, [facility] as Pharmacist (Pharmacist)  Recent office visits: ***  Recent consult visits: [facility] visits: {Hospital DC Yes/No:21091515}  Objective:  Lab Results  Component Value Date   CREATININE 0.79 10/21/2020   CREATININE 0.73 06/02/2020   CREATININE 0.79 03/02/2020    Lab Results  Component Value Date   HGBA1C 7.5 (H) 10/21/2020   Last diabetic Eye exam:  Lab Results  Component Value Date/Time   HMDIABEYEEXA No Retinopathy 10/30/2019 12:00 AM    Last diabetic Foot exam: No results found for: HMDIABFOOTEX      Component Value Date/Time   CHOL 158 06/02/2020 1028   TRIG 180.0 (H) 06/02/2020 1028   HDL 51.10 06/02/2020 1028   CHOLHDL 3 06/02/2020 1028   VLDL 36.0 06/02/2020 1028   LDLCALC 71 06/02/2020 1028   LDLCALC 75 10/24/2017 1559   LDLDIRECT 104.0 04/08/2017 1125    Hepatic Function Latest Ref Rng & Units 06/02/2020 11/30/2019 05/28/2019  Total Protein 6.0 - 8.3 g/dL 7.5 7.7 7.4  Albumin 3.5 - 5.2 g/dL 4.5 4.5 4.4  AST 0 - 37 U/L 29 44(H) 33  ALT 0 - 35 U/L 40(H) 52(H)  42(H)  Alk Phosphatase 39 - 117 U/L 51 53 57  Total Bilirubin 0.2 - 1.2 mg/dL 0.6 0.6 0.5  Bilirubin, Direct 0.0 - 0.3 mg/dL 0.1 0.1 0.1    Lab Results  Component Value Date/Time   TSH 1.76 06/02/2020 10:28 AM   TSH 1.34 11/30/2019 09:49 AM    CBC Latest Ref Rng & Units 06/02/2020 11/30/2019 05/28/2019  WBC 4.0 - 10.5 K/uL 7.5 7.8 8.0  Hemoglobin 12.0 - 15.0 g/dL 12.5 12.6 12.6  Hematocrit 36.0 - 46.0 % 37.9 38.3 38.4  Platelets 150.0 - 400.0 K/uL 215.0 218.0 211.0    No results found for: VD25OH  Clinical ASCVD: {YES/NO:21197} The 10-year ASCVD risk score Mikey Bussing DC Jr., et al., 2013) is: 25.8%   Values used to calculate the score:     Age: 43 years     Sex: Female     Is Non-Hispanic African American: No     Diabetic: Yes     Tobacco smoker: No     Systolic Blood Pressure: 470 mmHg     Is BP treated: Yes     HDL Cholesterol: 51.1 mg/dL     Total Cholesterol: 158 mg/dL    Other: (CHADS2VASc if Afib, PHQ9 if depression, MMRC or CAT for COPD, ACT, DEXA)  Social History   Tobacco Use  Smoking Status Never  Smokeless Tobacco Never   BP Readings from Last 3 Encounters:  10/21/20 118/78  07/05/20 (!) 142/70  06/02/20 (!) 160/70   Pulse Readings from Last 3 Encounters:  10/21/20 75  07/05/20 77  06/02/20 81   Wt Readings from Last 3 Encounters:  10/21/20 188 lb 6.4 oz (85.5 kg)  07/05/20 188 lb 9.6 oz (85.5 kg)  06/02/20 187 lb 6.4 oz (85 kg)    Assessment: Review of patient past medical history, allergies, medications, health status, including review of consultants reports, laboratory and other test data, was performed as part of comprehensive evaluation and provision of chronic care management services.   SDOH:  (Social Determinants of Health) assessments and interventions performed:    CCM Care Plan  Allergies  Allergen Reactions   Nsaids Other (See Comments)   Reglan [Metoclopramide] Other (See Comments)    Tremor    Estrogens Rash    Topical patch  caused the rash   Other Rash    Medications Reviewed Today     Reviewed by Maximiano Coss, NP (Nurse Practitioner) on 02/06/21 at 1242  Med List Status: <None>   Medication Order Taking? Sig Documenting Provider Last Dose Status Informant  amLODipine (NORVASC) 10 MG tablet [id number] Yes Take 1 tablet (10 mg total) by mouth daily. [name], MD Taking Active   Blood Glucose Monitoring Suppl (ONE TOUCH ULTRA 2) w/Device KIT [id number] Yes Pt uses glucometer to check sugars twice daily. Dx E11.9 [name], MD Taking Active   empagliflozin (JARDIANCE) 10 MG TABS tablet [id number] Yes Take 1 tablet (10 mg total) by mouth daily. [name], MD Taking Active   fluticasone Asencion Islam) 50 MCG/ACT nasal spray [id number] Yes Place 2 sprays into both nostrils daily. [name], MD Taking Active   gabapentin (NEURONTIN) 300 MG capsule [id number] Yes TAKE 2 CAPSULES (600 MG TOTAL) BY MOUTH 3 (THREE) TIMES DAILY. [name], MD Taking Active   glipiZIDE (GLUCOTROL XL) 10 MG 24 hr tablet [id number] Yes TAKE 1 TABLET (10 MG TOTAL) BY MOUTH DAILY WITH BREAKFAST. [name], MD Taking Active   Lancets (ONETOUCH DELICA PLUS [id number]) [place] [id number] Yes USE WHEN CHECKING BLOOD SUGAR AS DIRECTED [name], MD Taking Active   losartan (COZAAR) 50 MG tablet [id number] Yes TAKE 2 TABLETS BY MOUTH EVERY DAY [name], MD Taking Active   MELATONIN PO [id number] Yes Take by mouth. [provider] Taking Active   meloxicam (MOBIC) 15 MG tablet [id number] Yes  [provider] Taking Active   metFORMIN (GLUCOPHAGE) 500 MG tablet [id number] Yes Take 2 tablets (1,000 mg total) by mouth 2 (two) times daily with a meal. [name], MD Taking Active   omeprazole (PRILOSEC) 40 MG capsule [id number] Yes Take 1 capsule (40 mg total) by mouth 2 (two) times daily.  Patient taking differently: Take 40 mg by mouth daily.   [name], MD  Taking Active   [facility] ULTRA test strip [id number] Yes CHECK BLOOD SUGAR 1-2 TIMES DAILY [name], MD Taking Active   simvastatin (ZOCOR) 10 MG tablet [id number] Yes TAKE 1 TABLET BY MOUTH EVERYDAY AT BEDTIME [name], MD Taking Active             Patient Active Problem List   Diagnosis Date Noted   Allergic rhinitis 10/21/2020   Dysphagia 10/21/2020   Fatty liver 10/21/2020   Gastro-esophageal reflux disease without esophagitis 10/21/2020   Iron deficiency anemia 10/21/2020   Osteopenia 10/21/2020   Personal history of colonic polyps 10/21/2020   Pure hypercholesterolemia 10/21/2020   Vitamin D deficiency 10/21/2020   Dental disorder 03/02/2020   Chronic venous insufficiency 10/09/2019   Varicose veins of  left lower extremity with pain 08/31/2019   Obesity (BMI 30-39.9) 08/31/2019   Heart murmur 10/24/2017   Physical exam 10/10/2016   Gastroparesis due to DM ([place]) 06/05/2016   Right shoulder pain 02/03/2016   Type 2 diabetes mellitus with diabetic neuropathy, without long-term current use of insulin ([place]) 11/07/2015   Hyperlipidemia 11/07/2015   Skull mass 07/02/2011   Fibromyalgia 06/25/2011   Essential hypertension 09/24/2007    Immunization History  Administered Date(s) Administered   Fluad Quad(high Dose 65+) 05/28/2019, 06/02/2020   Influenza Split 05/30/2009, 05/09/2010, 05/29/2013   Influenza, High Dose Seasonal PF 07/16/2014, 06/14/2015, 04/28/2018   Influenza,inj,Quad PF,6+ Mos 05/09/2011, 06/14/2015, 05/14/2016, 04/08/2017   Pneumococcal Conjugate-13 10/23/2013   Pneumococcal Polysaccharide-23 11/04/2014   Tdap 02/27/2010, 03/13/2011    Conditions to be addressed/monitored: {CCM ASSESSMENT DISEASE OPTIONS:25047}  There are no care plans that you recently modified to display for this patient.  Current Barriers:  {pharmacybarriers:24917}  Pharmacist Clinical Goal(s):  Patient will {PHARMACYGOALCHOICES:24921} through collaboration  with PharmD and provider.   Interventions: 1:1 collaboration with [name], MD regarding development and update of comprehensive plan of care as evidenced by provider attestation and co-signature Inter-disciplinary care team collaboration (see longitudinal plan of care) Comprehensive medication review performed; medication list updated in electronic medical record  {CCM PHARMD DISEASE STATES:25130}  Patient Goals/Self-Care Activities Patient will:  - {pharmacypatientgoals:24919}  Follow Up Plan: {CM FOLLOW UP HTMB:31121}  Medication Assistance: {MEDASSISTANCEINFO:25044}  Patient's preferred pharmacy is:  [id number] [place], [place] - [address] [place] [address]Phone: [phone]: [phone] Uses pill box? {Yes or If no, why not?:20788} Pt endorses ***% compliance  Follow Up:  {FOLLOWUP:24991}  Plan: {CM FOLLOW UP PLAN:25073}  SIG***

## 2021-03-15 NOTE — Telephone Encounter (Unsigned)
  Chronic Care Management  Outreach Note   Name: Karen Gonzales MRN: [id number] DOB: Mar 21, 1946  Referred by: [name], MD Reason for referral: Telephone Appointment with [place] Pharmacist, Madelin Rear.   An unsuccessful telephone outreach was attempted today. The patient was referred to the pharmacist for assistance with care management and care coordination.   Telephone appointment with clinical pharmacist today (03/15/2021) at 930am (husband) and 10am (patient).  Madelin Rear, PharmD, CPP Clinical Pharmacist Practitioner  [facility]  [phone]

## 2021-03-22 ENCOUNTER — Other Ambulatory Visit: Payer: Self-pay | Admitting: Family Medicine

## 2021-03-22 ENCOUNTER — Ambulatory Visit (INDEPENDENT_AMBULATORY_CARE_PROVIDER_SITE_OTHER): Payer: PPO

## 2021-03-22 DIAGNOSIS — K219 Gastro-esophageal reflux disease without esophagitis: Secondary | ICD-10-CM

## 2021-03-22 DIAGNOSIS — E1140 Type 2 diabetes mellitus with diabetic neuropathy, unspecified: Secondary | ICD-10-CM

## 2021-03-22 MED ORDER — GLIPIZIDE ER 5 MG PO TB24: 5.0000 mg | ORAL_TABLET | Freq: Every day | ORAL | 0 refills | Status: DC

## 2021-03-22 NOTE — Progress Notes (Signed)
Chronic Care Management Pharmacy Note  03/22/2021 Name:  Karen Gonzales MRN:  [id number] DOB:  01/26/1946  Summary: -Patient assistance now approved for Januvia  -Changed glipizde XL 5 mg (was splitting 10 mg XL in half)  Subjective: [name] is an 75 y.o. year old female who is a primary patient of Tabori, Aundra Millet, MD.  The CCM team was consulted for assistance with disease management and care coordination needs.    Engaged with patient by telephone for follow up visit in response to provider referral for pharmacy case management and/or care coordination services.   Januvia patient assistance has been approved and received. Reports fasting blood sugars this week at 123, 125, 140, 161, 123. Did develop some lows during URI due to not eating and was splitting glipizide preparation and denies any lows following this. Due to XR preparation agreed to adjust this to whole glipizide 5 mg tablet.  Other concerns include acid reflux and intermittent symptoms. Has been taking meloxicam every day regardless of pain. Otherwise no concerns, stays busy looking after grandchildren.   Consent to Services:  The patient was given information about Chronic Care Management services, agreed to services, and gave verbal consent prior to initiation of services.  Please see initial visit note for detailed documentation.   Patient Care Team: [name], MD as PCP - General (Family Medicine) Earnie Larsson, MD as Consulting Physician (Neurosurgery) Laurence Spates, MD (Inactive) as Consulting Physician (Gastroenterology) Wallene Huh, DPM as Consulting Physician (Podiatry) Madelin Rear, [facility] as Pharmacist (Pharmacist)  Objective:  Lab Results  Component Value Date   CREATININE 0.79 10/21/2020   CREATININE 0.73 06/02/2020   CREATININE 0.79 03/02/2020    Lab Results  Component Value Date   HGBA1C 7.5 (H) 10/21/2020   Last diabetic Eye exam:  Lab Results  Component Value  Date/Time   HMDIABEYEEXA No Retinopathy 10/30/2019 12:00 AM    Last diabetic Foot exam: No results found for: HMDIABFOOTEX      Component Value Date/Time   CHOL 158 06/02/2020 1028   TRIG 180.0 (H) 06/02/2020 1028   HDL 51.10 06/02/2020 1028   CHOLHDL 3 06/02/2020 1028   VLDL 36.0 06/02/2020 1028   LDLCALC 71 06/02/2020 1028   LDLCALC 75 10/24/2017 1559   LDLDIRECT 104.0 04/08/2017 1125    Hepatic Function Latest Ref Rng & Units 06/02/2020 11/30/2019 05/28/2019  Total Protein 6.0 - 8.3 g/dL 7.5 7.7 7.4  Albumin 3.5 - 5.2 g/dL 4.5 4.5 4.4  AST 0 - 37 U/L 29 44(H) 33  ALT 0 - 35 U/L 40(H) 52(H) 42(H)  Alk Phosphatase 39 - 117 U/L 51 53 57  Total Bilirubin 0.2 - 1.2 mg/dL 0.6 0.6 0.5  Bilirubin, Direct 0.0 - 0.3 mg/dL 0.1 0.1 0.1    Lab Results  Component Value Date/Time   TSH 1.76 06/02/2020 10:28 AM   TSH 1.34 11/30/2019 09:49 AM    CBC Latest Ref Rng & Units 06/02/2020 11/30/2019 05/28/2019  WBC 4.0 - 10.5 K/uL 7.5 7.8 8.0  Hemoglobin 12.0 - 15.0 g/dL 12.5 12.6 12.6  Hematocrit 36.0 - 46.0 % 37.9 38.3 38.4  Platelets 150.0 - 400.0 K/uL 215.0 218.0 211.0    No results found for: VD25OH  Clinical ASCVD:  The 10-year ASCVD risk score Mikey Bussing DC Jr., et al., 2013) is: 25.8%   Values used to calculate the score:     Age: 66 years     Sex: Female     Is Non-Hispanic  African American: No     Diabetic: Yes     Tobacco smoker: No     Systolic Blood Pressure: 616 mmHg     Is BP treated: Yes     HDL Cholesterol: 51.1 mg/dL     Total Cholesterol: 158 mg/dL    Social History   Tobacco Use  Smoking Status Never  Smokeless Tobacco Never   BP Readings from Last 3 Encounters:  10/21/20 118/78  07/05/20 (!) 142/70  06/02/20 (!) 160/70   Pulse Readings from Last 3 Encounters:  10/21/20 75  07/05/20 77  06/02/20 81   Wt Readings from Last 3 Encounters:  10/21/20 188 lb 6.4 oz (85.5 kg)  07/05/20 188 lb 9.6 oz (85.5 kg)  06/02/20 187 lb 6.4 oz (85 kg)     Assessment: Review of patient past medical history, allergies, medications, health status, including review of consultants reports, laboratory and other test data, was performed as part of comprehensive evaluation and provision of chronic care management services.   SDOH:  (Social Determinants of Health) assessments and interventions performed:    CCM Care Plan  Allergies  Allergen Reactions   Nsaids Other (See Comments)   Reglan [Metoclopramide] Other (See Comments)    Tremor    Estrogens Rash    Topical patch caused the rash   Other Rash    Medications Reviewed Today     Reviewed by Madelin Rear, [facility] (Pharmacist) on 03/22/21 at [phone]  Med List Status: <None>   Medication Order Taking? Sig Documenting Provider Last Dose Status Informant  amLODipine (NORVASC) 10 MG tablet [id number] Yes TAKE 1 TABLET BY MOUTH EVERY DAY [name], MD  Active   Blood Glucose Monitoring Suppl (ONE TOUCH ULTRA 2) w/Device KIT [id number] Yes Pt uses glucometer to check sugars twice daily. Dx E11.9 [name], MD  Active   empagliflozin (JARDIANCE) 10 MG TABS tablet [id number] Yes Take 1 tablet (10 mg total) by mouth daily. [name], MD  Active   gabapentin (NEURONTIN) 300 MG capsule [id number] Yes TAKE 2 CAPSULES (600 MG TOTAL) BY MOUTH 3 (THREE) TIMES DAILY. [name], MD  Active   glipiZIDE (GLUCOTROL XL) 5 MG 24 hr tablet [id number]  Take 1 tablet (5 mg total) by mouth daily with breakfast. [name], MD  Active   Lancets (ONETOUCH DELICA PLUS [id number]) [place] [id number] Yes USE WHEN CHECKING BLOOD SUGAR AS DIRECTED [name], MD  Active   losartan (COZAAR) 50 MG tablet [id number] Yes TAKE 2 TABLETS BY MOUTH EVERY DAY [name], MD  Active   MELATONIN PO [id number] Yes Take by mouth. [provider]  Active   meloxicam (MOBIC) 15 MG tablet [id number] Yes  [provider]  Active   metFORMIN (GLUCOPHAGE) 500 MG tablet  [id number] Yes Take 2 tablets (1,000 mg total) by mouth 2 (two) times daily with a meal. [name], MD  Active   omeprazole (PRILOSEC) 40 MG capsule [id number] Yes Take 1 capsule (40 mg total) by mouth 2 (two) times daily.  Patient taking differently: Take 40 mg by mouth daily.   [name], MD  Active   [facility] ULTRA test strip [id number] Yes CHECK BLOOD SUGAR 1-2 TIMES DAILY [name], MD  Active   simvastatin (ZOCOR) 10 MG tablet [id number] Yes TAKE 1 TABLET BY MOUTH EVERYDAY AT BEDTIME [name], MD  Active   sitaGLIPtin (JANUVIA) 100 MG tablet [id number] Yes Take 100 mg by mouth  daily. [provider]  Active             Patient Active Problem List   Diagnosis Date Noted   Allergic rhinitis 10/21/2020   Dysphagia 10/21/2020   Fatty liver 10/21/2020   Gastro-esophageal reflux disease without esophagitis 10/21/2020   Iron deficiency anemia 10/21/2020   Osteopenia 10/21/2020   Personal history of colonic polyps 10/21/2020   Pure hypercholesterolemia 10/21/2020   Vitamin D deficiency 10/21/2020   Dental disorder 03/02/2020   Chronic venous insufficiency 10/09/2019   Varicose veins of left lower extremity with pain 08/31/2019   Obesity (BMI 30-39.9) 08/31/2019   Heart murmur 10/24/2017   Physical exam 10/10/2016   Gastroparesis due to DM ([place]) 06/05/2016   Right shoulder pain 02/03/2016   Type 2 diabetes mellitus with diabetic neuropathy, without long-term current use of insulin ([place]) 11/07/2015   Hyperlipidemia 11/07/2015   Skull mass 07/02/2011   Fibromyalgia 06/25/2011   Essential hypertension 09/24/2007    Immunization History  Administered Date(s) Administered   Fluad Quad(high Dose 65+) 05/28/2019, 06/02/2020   Influenza Split 05/30/2009, 05/09/2010, 05/29/2013   Influenza, High Dose Seasonal PF 07/16/2014, 06/14/2015, 04/28/2018   Influenza,inj,Quad PF,6+ Mos 05/09/2011, 06/14/2015, 05/14/2016, 04/08/2017   Pneumococcal  Conjugate-13 10/23/2013   Pneumococcal Polysaccharide-23 11/04/2014   Tdap 02/27/2010, 03/13/2011    Conditions to be addressed/monitored: GERD, HLD, HTN, DM, Osteopenia  Care Plan : [place]  Updates made by Madelin Rear, [facility] since 03/22/2021 12:00 AM     Problem: HLD HTN DMII Obesity      Goal: Disease Management   Start Date: 10/20/2020  Expected End Date: 10/20/2021  This Visit's Progress: On track  Recent Progress: On track  Priority: High  Note:   Current Barriers:  Unable to maintain control of diabetes  Pharmacist Clinical Goal(s):  Patient will verbalize ability to afford treatment regimen achieve adherence to monitoring guidelines and medication adherence to achieve therapeutic efficacy contact provider office for questions/concerns as evidenced notation of same in electronic health record through collaboration with PharmD and provider.   Interventions: 1:1 collaboration with [name], MD regarding development and update of comprehensive plan of care as evidenced by provider attestation and co-signature Inter-disciplinary care team collaboration (see longitudinal plan of care) Comprehensive medication review performed; medication list updated in electronic medical record  Diabetes (A1c goal <7%) -Not ideally controlled -Current medications: Metformin 1000 mg IR twice daily with meals Jardiance 10 mg once daily (patient assistance)  Januvia 100 mg once daily (patient assistance) Glipizide 10 mg XL once daily with breakfast (had been splitting after suffering lows) -Current home glucose readings fasting glucose:  August 2022: 123, 125, 140, 161, 123 May 2022: 118-180  -Denies hypoglycemic/hyperglycemic symptoms -Current exercise: no formal exercise, stays active with grandchildren -Educated on A1c and blood sugar goals; Benefits of routine self-monitoring of blood sugar; -Counseled to check feet daily and get yearly eye  exams -Recommended to continue current medication CPA 1 month DM call  CHANGE glipizide XL to 5 mg tablet due recommendation to not split XL preparations  GERD (Goal: minimize symptoms) -Controlled -Current treatment  Omeprazole 40 mg once daily -Reviewed Rx and food related triggers: had been using meloxicam 15 mg daily without regard to pain. Counseled on potential risks - agreeable to conserving use to as needed -Recommended to continue current medication  Patient Goals/Self-Care Activities Patient will:  - take medications as prescribed Minimize meloxicam use, consider checking BG 2 hours after dinner 2-3x/wk to help  assess blood sugar control throughout the day  Medication Assistance: Lind Guest and Januvia through patient assistance     Patient's preferred pharmacy is:  [id number] [place], [place] - [address] [place] [address]Phone: [phone]: [phone] Pt endorses 100% compliance  Follow Up:  Patient agrees to Care Plan and Follow-up.  Plan:  1 month CPA DM call and check on acid reflux symptoms, 3 month CPP telephone f/u. Patient sees PCP 05/2021  Future Appointments  Date Time Provider [place] 05/23/2021 10:30 AM TMidge Minium MD [facility]  06/05/2021 11:15 AM [facility] HEALTH COACH [facility]  06/21/2021  9:00 AM [facility] CCM PHARMACIST [facility]   JMadelin Rear PharmD, CPP Clinical Pharmacist Practitioner  [facility]  [phone]

## 2021-03-22 NOTE — Patient Instructions (Signed)
Karen Gonzales,  Thank you for talking with me today. I have included our care plan/goals in the following pages.   Please review and call me at [phone] with any questions.  Thanks! Karen Gonzales, Pharm.D., BCGP Clinical Pharmacist [facility] [place] [phone] Patient Care Plan: [name]     Problem Identified: HLD HTN DMII Obesity      Goal: Disease Management   Start Date: 10/20/2020  Expected End Date: 10/20/2021  This Visit's Progress: On track  Recent Progress: On track  Priority: High  Note:   Current Barriers:  Unable to maintain control of diabetes  Pharmacist Clinical Goal(s):  Patient will verbalize ability to afford treatment regimen achieve adherence to monitoring guidelines and medication adherence to achieve therapeutic efficacy contact provider office for questions/concerns as evidenced notation of same in electronic health record through collaboration with PharmD and provider.   Interventions: 1:1 collaboration with Karen Minium, MD regarding development and update of comprehensive plan of care as evidenced by provider attestation and co-signature Inter-disciplinary care team collaboration (see longitudinal plan of care) Comprehensive medication review performed; medication list updated in electronic medical record  Diabetes (A1c goal <7%) -Not ideally controlled -Current medications: Metformin 1000 mg IR twice daily with meals Jardiance 10 mg once daily (patient assistance)  Januvia 100 mg once daily (patient assistance) Glipizide 10 mg XL once daily with breakfast (had been splitting after suffering lows) -Current home glucose readings fasting glucose:  August 2022: 123, 125, 140, 161, 123 May 2022: 118-180  -Denies hypoglycemic/hyperglycemic symptoms -Current exercise: no formal exercise, stays active with grandchildren -Educated on A1c and blood sugar goals; Benefits of  routine self-monitoring of blood sugar; -Counseled to check feet daily and get yearly eye exams -Recommended to continue current medication CPA 1 month DM call  CHANGE glipizide XL to 5 mg tablet due recommendation to not split XL preparations  GERD (Goal: minimize symptoms) -Controlled -Current treatment  Omeprazole 40 mg once daily -Reviewed Rx and food related triggers: had been using meloxicam 15 mg daily without regard to pain. Counseled on potential risks - agreeable to conserving use to as needed -Recommended to continue current medication  Patient Goals/Self-Care Activities Patient will:  - take medications as prescribed Minimize meloxicam use, consider checking BG 2 hours after dinner 2-3x/wk to help assess blood sugar control throughout the day  Medication Assistance: Karen Gonzales and Januvia through patient assistance      The patient verbalized understanding of instructions provided today and agreed to receive a MyChart copy of patient instruction and/or educational materials. Telephone follow up appointment with pharmacy team member scheduled for: See next appointment with "Care Management Staff" under "What's Next" below.

## 2021-04-14 ENCOUNTER — Telehealth: Payer: Self-pay

## 2021-04-14 NOTE — Progress Notes (Signed)
Chronic Care Management Pharmacy Assistant   Name: Karen Gonzales  MRN: [id number] DOB: 04/06/1946   Reason for Encounter: Disease State - Diabetes Call     Recent office visits:  None noted.   Recent consult visits:  None noted.  Hospital visits:  None in previous 6 months  Medications: Outpatient Encounter Medications as of 04/14/2021  Medication Sig   amLODipine (NORVASC) 10 MG tablet TAKE 1 TABLET BY MOUTH EVERY DAY   Blood Glucose Monitoring Suppl (ONE TOUCH ULTRA 2) w/Device KIT Pt uses glucometer to check sugars twice daily. Dx E11.9   empagliflozin (JARDIANCE) 10 MG TABS tablet Take 1 tablet (10 mg total) by mouth daily.   gabapentin (NEURONTIN) 300 MG capsule TAKE 2 CAPSULES BY MOUTH 3 TIMES DAILY.   glipiZIDE (GLUCOTROL XL) 5 MG 24 hr tablet Take 1 tablet (5 mg total) by mouth daily with breakfast.   Lancets (ONETOUCH DELICA PLUS [id number]) MISC USE WHEN CHECKING BLOOD SUGAR AS DIRECTED   losartan (COZAAR) 50 MG tablet TAKE 2 TABLETS BY MOUTH EVERY DAY   MELATONIN PO Take by mouth.   meloxicam (MOBIC) 15 MG tablet    metFORMIN (GLUCOPHAGE) 500 MG tablet Take 2 tablets (1,000 mg total) by mouth 2 (two) times daily with a meal.   omeprazole (PRILOSEC) 40 MG capsule Take 1 capsule (40 mg total) by mouth 2 (two) times daily. (Patient taking differently: Take 40 mg by mouth daily.)   ONETOUCH ULTRA test strip CHECK BLOOD SUGAR 1-2 TIMES DAILY   simvastatin (ZOCOR) 10 MG tablet TAKE 1 TABLET BY MOUTH EVERYDAY AT BEDTIME   sitaGLIPtin (JANUVIA) 100 MG tablet Take 100 mg by mouth daily.   No facility-administered encounter medications on file as of 04/14/2021.    Current antihyperglycemic regimen:  Metformin 1000 mg IR twice daily with meals Jardiance 10 mg once daily (patient assistance)  Januvia 100 mg once daily (patient assistance) Glipizide 10 mg XL once daily with breakfast (had been splitting after suffering lows)   What recent interventions/DTPs have been  made to improve glycemic control:  Patient reported she has recently decreased Glipizide to 5 mg daily. She has not been taking Meloxicam and her reflux has improved. She still has some symptoms but they have greatly improved.    Have there been any recent hospitalizations or ED visits since last visit with CPP?  Patient has not had any hospitalizations or Ed visits since last visit  with CPP.   Patient denies hypoglycemic symptoms, including Pale, Sweaty, Shaky, Hungry, Nervous/irritable, and Vision changes   Patient denies hyperglycemic symptoms, including blurry vision, excessive thirst, fatigue, polyuria, and weakness   How often are you checking your blood sugar? Patient reports she is checking blood sugars once daily.   What are your blood sugars ranging?  Fasting: 116-130 Before meals: n/a After meals: n/a Bedtime: n/a   During the week, how often does your blood glucose drop below 70?  Patient denied any low blood sugars below 70.   Are you checking your feet daily/regularly? Patient reported she does check feet routinely.   Adherence Review: Is the patient currently on a STATIN medication? Yes Is the patient currently on ACE/ARB medication? Yes Does the patient have >5 day gap between last estimated fill dates? No  Metformin 1000 mg IR twice daily with meals - last filled 02/11/21 90 days  Jardiance 10 mg once daily (patient assistance)  Januvia 100 mg once daily (patient assistance) Glipizide 10 mg XL once daily  with breakfast (had been splitting after suffering lows) - last filled 03/14/21 90 days   Star Rating Drugs: Simvastatin (ZOCOR) 10 MG tablet - last filled 03/06/21 90 days  Metformin (GLUCOPHAGE) 500 MG tablet - last filled 02/11/21 90 days  Losartan (COZAAR) 50 MG tablet - last filled 02/22/21 90 days  Glipizide (GLUCOTROL XL) 5 MG 24 hr tablet - last filled 03/14/21 90 days   Patient reports reflux improved since she has decreased Glipizide to 5 mg and she  has not been taking any Meloxicam.    Future Appointments  Date Time Provider [place]  05/23/2021 10:30 AM [name], MD [facility] [place]  06/05/2021 11:15 AM [facility] HEALTH COACH [facility]  06/21/2021  9:00 AM [facility] CCM PHARMACIST [facility] [place], CCMA Clinical Pharmacist Assistant  [phone]  Time Spent: 30 minutes

## 2021-05-04 ENCOUNTER — Telehealth: Payer: Self-pay

## 2021-05-04 NOTE — Telephone Encounter (Signed)
Unfortunately we don't have any record of recent UTI.  If this is a recurrent problem, I would like to get a urine culture to make sure things are being treated appropriately.  That would require a visit for [place] to discuss sxs, possible causes, and treatment options

## 2021-05-04 NOTE — Telephone Encounter (Signed)
Patient would like something for UTI symptoms that seems to be reoccurring due to ongoing meds that she take. Does pt need an appt or can something be called in? Please advise

## 2021-05-04 NOTE — Telephone Encounter (Signed)
Called patient and left a detailed message on vm.

## 2021-05-04 NOTE — Telephone Encounter (Signed)
Patient called in c/o UTI symptoms due to medications that she is on. States this is a reoccurring issue. Wanting to know if a medication can be sent into her pharmacy for her w/o a visit.

## 2021-05-05 ENCOUNTER — Other Ambulatory Visit: Payer: Self-pay

## 2021-05-05 ENCOUNTER — Ambulatory Visit (INDEPENDENT_AMBULATORY_CARE_PROVIDER_SITE_OTHER): Payer: PPO | Admitting: Registered Nurse

## 2021-05-05 ENCOUNTER — Encounter: Payer: Self-pay | Admitting: Registered Nurse

## 2021-05-05 VITALS — BP 140/80 | HR 85 | Temp 98.2°F | Resp 18 | Ht 65.0 in | Wt 187.0 lb

## 2021-05-05 DIAGNOSIS — R3915 Urgency of urination: Secondary | ICD-10-CM

## 2021-05-05 LAB — URINALYSIS, ROUTINE W REFLEX MICROSCOPIC
Bilirubin Urine: NEGATIVE
Hgb urine dipstick: NEGATIVE
Ketones, ur: NEGATIVE
Leukocytes,Ua: NEGATIVE
Nitrite: POSITIVE — AB
Specific Gravity, Urine: <=1.005 — AB (ref 1.000–1.030)
Urine Glucose: >=1000 — AB
Urobilinogen, UA: 1 (ref 0.0–1.0)
pH: 5.5 (ref 5.0–8.0)

## 2021-05-05 MED ORDER — PHENAZOPYRIDINE HCL 97.2 MG PO TABS
97.0000 mg | ORAL_TABLET | Freq: Three times a day (TID) | ORAL | 0 refills | Status: DC | PRN
Start: 2021-05-05 — End: 2021-05-23

## 2021-05-05 MED ORDER — SULFAMETHOXAZOLE-TRIMETHOPRIM 800-160 MG PO TABS: 1.0000 | ORAL_TABLET | Freq: Two times a day (BID) | ORAL | 0 refills | Status: DC

## 2021-05-05 NOTE — Patient Instructions (Signed)
[name] -   Looks like a UTI!  Take bactrim twice daily for three days  Any side effects, stop taking it and let me know  If symptoms worsen or fail to improve, let me know  Culture results should be back Monday, if anything notable, I'll call   Thank you   Karen Gonzales

## 2021-05-05 NOTE — Progress Notes (Signed)
Established Patient Office Visit  Subjective:  Patient ID: Karen Gonzales, female    DOB: [birth year]  Age: 75 y.o. MRN: [id number]  CC:  Chief Complaint  Patient presents with   Urinary Tract Infection    HPI AJNA MOORS presents for UTI  Onset last week Frequency, burning, urgency Has taken AZO with good relief  No systemic symptoms  Has had UTI in past, feels similar  Past Medical History:  Diagnosis Date   Anti-cardiolipin antibody syndrome ([place]) 06/25/2011   Arthritis    Blood transfusion    had when knee was replaced   Bronchitis    history of   Chills    Complication of anesthesia    Cough    COVID-19    Diabetes mellitus    DVT (deep venous thrombosis) ([place]) 06/25/2011   Fatty liver disease, nonalcoholic    determinde by Ultrasound 11/11   GERD (gastroesophageal reflux disease)    H/O vitamin D deficiency    Heart murmur    Heart murmur    Benign   Hiatal hernia    Hyperlipidemia    Hypertension    Hyponatremia    with diuretic use   Osteopenia    PONV (postoperative nausea and vomiting)    Vertigo     Past Surgical History:  Procedure Laterality Date   ABDOMINAL HYSTERECTOMY     ACHILLES TENDON SURGERY     bilateral    CARPAL TUNNEL RELEASE     bilateral    CHOLECYSTECTOMY  1969   open   CRANIOTOMY  07/09/2011   Procedure: CRANIOTOMY HEMATOMA EVACUATION SUBDURAL;  Surgeon: Cooper Render Pool;  Location: [facility];  Service: Neurosurgery;  Laterality: Right;  Excision of Skull Lesion-Right Right Parietal Crani w/ Resection of Skull Tumor (1 1/2 hours - to follow)   [place]     JOINT REPLACEMENT  2004   left knee replacement    KNEE SURGERY     total of 6 surgeries on both knees    REPAIR PERONEAL TENDONS ANKLE     TUBAL LIGATION     TUMOR REMOVAL     on pelvic bone     Family History  Problem Relation Age of Onset   Kidney disease Mother    Osteoporosis Mother    Heart disease Father    Cancer Father         lymphoma   Osteoporosis Father    Healthy Brother    Cancer Maternal Grandmother        lymphoma   Breast cancer Maternal Grandmother    Cancer Paternal Grandmother        breast   Breast cancer Paternal Aunt    Hodgkin's lymphoma Grandchild     Social History   Socioeconomic History   Marital status: Married    Spouse name: Not on file   Number of children: Not on file   Years of education: Not on file   Highest education level: Not on file  Occupational History   Not on file  Tobacco Use   Smoking status: Never   Smokeless tobacco: Never  Vaping Use   Vaping Use: Never used  Substance and Sexual Activity   Alcohol use: No   Drug use: No   Sexual activity: Not on file  Other Topics Concern   Not on file  Social History Narrative   Not on file   Social Determinants of Radio broadcast assistant  Strain: Low Risk    Difficulty of Paying Living Expenses: Not hard at all  Food Insecurity: Not on file  Transportation Needs: Not on file  Physical Activity: Inactive   Days of Exercise per Week: 0 days   Minutes of Exercise per Session: 0 min  Stress: No Stress Concern Present   Feeling of Stress : Not at all  Social Connections: Socially Integrated   Frequency of Communication with Friends and Family: More than three times a week   Frequency of Social Gatherings with Friends and Family: Once a week   Attends Religious Services: More than 4 times per year   Active Member of [employer] or Organizations: Yes   Attends Archivist Meetings: 1 to 4 times per year   Marital Status: Married  Human resources officer Violence: Not At Risk   Fear of Current or Ex-Partner: No   Emotionally Abused: No   Physically Abused: No   Sexually Abused: No    Outpatient Medications Prior to Visit  Medication Sig Dispense Refill   amLODipine (NORVASC) 10 MG tablet TAKE 1 TABLET BY MOUTH EVERY DAY 90 tablet 1   Blood Glucose Monitoring Suppl (ONE TOUCH ULTRA 2) w/Device KIT Pt  uses glucometer to check sugars twice daily. Dx E11.9 1 each 0   empagliflozin (JARDIANCE) 10 MG TABS tablet Take 1 tablet (10 mg total) by mouth daily. 90 tablet 1   gabapentin (NEURONTIN) 300 MG capsule TAKE 2 CAPSULES BY MOUTH 3 TIMES DAILY. 180 capsule 3   glipiZIDE (GLUCOTROL XL) 5 MG 24 hr tablet Take 1 tablet (5 mg total) by mouth daily with breakfast. 90 tablet 0   Lancets (ONETOUCH DELICA PLUS [id number]) MISC USE WHEN CHECKING BLOOD SUGAR AS DIRECTED 100 each 4   losartan (COZAAR) 50 MG tablet TAKE 2 TABLETS BY MOUTH EVERY DAY 180 tablet 1   MELATONIN PO Take by mouth.     meloxicam (MOBIC) 15 MG tablet      metFORMIN (GLUCOPHAGE) 500 MG tablet Take 2 tablets (1,000 mg total) by mouth 2 (two) times daily with a meal. 360 tablet 1   omeprazole (PRILOSEC) 40 MG capsule Take 1 capsule (40 mg total) by mouth 2 (two) times daily. (Patient taking differently: Take 40 mg by mouth daily.) 60 capsule 1   ONETOUCH ULTRA test strip CHECK BLOOD SUGAR 1-2 TIMES DAILY 100 strip 12   simvastatin (ZOCOR) 10 MG tablet TAKE 1 TABLET BY MOUTH EVERYDAY AT BEDTIME 90 tablet 2   sitaGLIPtin (JANUVIA) 100 MG tablet Take 100 mg by mouth daily.     No facility-administered medications prior to visit.    Allergies  Allergen Reactions   Nsaids Other (See Comments)   Reglan [Metoclopramide] Other (See Comments)    Tremor    Estrogens Rash    Topical patch caused the rash   Other Rash    ROS Review of Systems Per hpi     Objective:    Physical Exam Vitals and nursing note reviewed.  Constitutional:      General: She is not in acute distress.    Appearance: Normal appearance. She is normal weight. She is not ill-appearing, toxic-appearing or diaphoretic.  Cardiovascular:     Rate and Rhythm: Normal rate and regular rhythm.     Heart sounds: Normal heart sounds. No murmur heard.   No friction rub. No gallop.  Pulmonary:     Effort: Pulmonary effort is normal. No respiratory distress.      Breath sounds: Normal  breath sounds. No stridor. No wheezing, rhonchi or rales.  Chest:     Chest wall: No tenderness.  Skin:    General: Skin is warm and dry.  Neurological:     General: No focal deficit present.     Mental Status: She is alert and oriented to person, place, and time. Mental status is at baseline.  Psychiatric:        Mood and Affect: Mood normal.        Behavior: Behavior normal.        Thought Content: Thought content normal.        Judgment: Judgment normal.    BP 140/80   Pulse 85   Temp 98.2 F (36.8 C) (Temporal)   Resp 18   Ht 5' 5"  (1.651 m)   Wt 187 lb (84.8 kg)   SpO2 97%   BMI 31.12 kg/m  Wt Readings from Last 3 Encounters:  05/05/21 187 lb (84.8 kg)  10/21/20 188 lb 6.4 oz (85.5 kg)  07/05/20 188 lb 9.6 oz (85.5 kg)     Health Maintenance Due  Topic Date Due   COVID-19 Vaccine (1) Never done   DEXA SCAN  11/19/2019   OPHTHALMOLOGY EXAM  10/29/2020   TETANUS/TDAP  03/12/2021   INFLUENZA VACCINE  03/13/2021   HEMOGLOBIN A1C  04/23/2021    There are no preventive care reminders to display for this patient.  Lab Results  Component Value Date   TSH 1.76 06/02/2020   Lab Results  Component Value Date   WBC 7.5 06/02/2020   HGB 12.5 06/02/2020   HCT 37.9 06/02/2020   MCV 82.9 06/02/2020   PLT 215.0 06/02/2020   Lab Results  Component Value Date   NA 134 (L) 10/21/2020   K 4.2 10/21/2020   CO2 26 10/21/2020   GLUCOSE 127 (H) 10/21/2020   BUN 18 10/21/2020   CREATININE 0.79 10/21/2020   BILITOT 0.6 06/02/2020   ALKPHOS 51 06/02/2020   AST 29 06/02/2020   ALT 40 (H) 06/02/2020   PROT 7.5 06/02/2020   ALBUMIN 4.5 06/02/2020   CALCIUM 9.9 10/21/2020   GFR 74.23 10/21/2020   Lab Results  Component Value Date   CHOL 158 06/02/2020   Lab Results  Component Value Date   HDL 51.10 06/02/2020   Lab Results  Component Value Date   LDLCALC 71 06/02/2020   Lab Results  Component Value Date   TRIG 180.0 (H) 06/02/2020    Lab Results  Component Value Date   CHOLHDL 3 06/02/2020   Lab Results  Component Value Date   HGBA1C 7.5 (H) 10/21/2020      Assessment & Plan:   Problem List Items Addressed This Visit   None Visit Diagnoses     Urinary urgency    -  Primary   Relevant Medications   phenazopyridine (PYRIDIUM) 97 MG tablet   sulfamethoxazole-trimethoprim (BACTRIM DS) 800-160 MG tablet   Other Relevant Orders   Urinalysis, Routine w reflex microscopic   Urine Culture       Meds ordered this encounter  Medications   phenazopyridine (PYRIDIUM) 97 MG tablet    Sig: Take 1 tablet (97 mg total) by mouth 3 (three) times daily as needed for pain.    Dispense:  10 tablet    Refill:  0    Order Specific Question:   Supervising Provider    Answer:   Carlota Raspberry, JEFFREY R [[id number]]   sulfamethoxazole-trimethoprim (BACTRIM DS) 800-160 MG tablet  Sig: Take 1 tablet by mouth 2 (two) times daily.    Dispense:  6 tablet    Refill:  0    Order Specific Question:   Supervising Provider    Answer:   Carlota Raspberry, JEFFREY R [[id number]]    Follow-up: Return if symptoms worsen or fail to improve.   PLAN Treat presumptively with bactrim as above Sending urinalysis and culture. Return if symptoms worsen or fail to improve Patient encouraged to call clinic with any questions, comments, or concerns.  Maximiano Coss, NP

## 2021-05-07 LAB — URINE CULTURE
MICRO NUMBER:: 12415848
SPECIMEN QUALITY:: ADEQUATE

## 2021-05-23 ENCOUNTER — Other Ambulatory Visit: Payer: Self-pay

## 2021-05-23 ENCOUNTER — Encounter: Payer: Self-pay | Admitting: Family Medicine

## 2021-05-23 ENCOUNTER — Ambulatory Visit (INDEPENDENT_AMBULATORY_CARE_PROVIDER_SITE_OTHER): Payer: PPO | Admitting: Family Medicine

## 2021-05-23 VITALS — BP 132/78 | HR 81 | Temp 97.6°F | Resp 16 | Wt 185.0 lb

## 2021-05-23 DIAGNOSIS — E1169 Type 2 diabetes mellitus with other specified complication: Secondary | ICD-10-CM

## 2021-05-23 DIAGNOSIS — I10 Essential (primary) hypertension: Secondary | ICD-10-CM | POA: Diagnosis not present

## 2021-05-23 DIAGNOSIS — E785 Hyperlipidemia, unspecified: Secondary | ICD-10-CM

## 2021-05-23 DIAGNOSIS — E1140 Type 2 diabetes mellitus with diabetic neuropathy, unspecified: Secondary | ICD-10-CM

## 2021-05-23 DIAGNOSIS — Z23 Encounter for immunization: Secondary | ICD-10-CM

## 2021-05-23 DIAGNOSIS — R011 Cardiac murmur, unspecified: Secondary | ICD-10-CM

## 2021-05-23 LAB — LIPID PANEL
Cholesterol: 146 mg/dL (ref 0–200)
HDL: 49.3 mg/dL (ref >39.00)
LDL Cholesterol: 61 mg/dL (ref 0–99)
NonHDL: 96.81
Total CHOL/HDL Ratio: 3
Triglycerides: 179 mg/dL — ABNORMAL HIGH (ref 0.0–149.0)
VLDL: 35.8 mg/dL (ref 0.0–40.0)

## 2021-05-23 LAB — BASIC METABOLIC PANEL
BUN: 19 mg/dL (ref 6–23)
CO2: 24 mEq/L (ref 19–32)
Calcium: 9.8 mg/dL (ref 8.4–10.5)
Chloride: 101 mEq/L (ref 96–112)
Creatinine, Ser: 0.85 mg/dL (ref 0.40–1.20)
GFR: 67.7 mL/min (ref >60.00)
Glucose, Bld: 94 mg/dL (ref 70–99)
Potassium: 4.8 mEq/L (ref 3.5–5.1)
Sodium: 135 mEq/L (ref 135–145)

## 2021-05-23 LAB — CBC WITH DIFFERENTIAL/PLATELET
Basophils Absolute: 0.1 10*3/uL (ref 0.0–0.1)
Basophils Relative: 0.9 % (ref 0.0–3.0)
Eosinophils Absolute: 0.2 10*3/uL (ref 0.0–0.7)
Eosinophils Relative: 3.2 % (ref 0.0–5.0)
HCT: 37.2 % (ref 36.0–46.0)
Hemoglobin: 11.8 g/dL — ABNORMAL LOW (ref 12.0–15.0)
Lymphocytes Relative: 29.6 % (ref 12.0–46.0)
Lymphs Abs: 2.3 10*3/uL (ref 0.7–4.0)
MCHC: 31.8 g/dL (ref 30.0–36.0)
MCV: 78.5 fl (ref 78.0–100.0)
Monocytes Absolute: 0.7 10*3/uL (ref 0.1–1.0)
Monocytes Relative: 9.5 % (ref 3.0–12.0)
Neutro Abs: 4.3 10*3/uL (ref 1.4–7.7)
Neutrophils Relative %: 56.8 % (ref 43.0–77.0)
Platelets: 217 10*3/uL (ref 150.0–400.0)
RBC: 4.75 Mil/uL (ref 3.87–5.11)
RDW: 16.6 % — ABNORMAL HIGH (ref 11.5–15.5)
WBC: 7.6 10*3/uL (ref 4.0–10.5)

## 2021-05-23 LAB — HEPATIC FUNCTION PANEL
ALT: 35 U/L (ref 0–35)
AST: 30 U/L (ref 0–37)
Albumin: 4.6 g/dL (ref 3.5–5.2)
Alkaline Phosphatase: 53 U/L (ref 39–117)
Bilirubin, Direct: 0.1 mg/dL (ref 0.0–0.3)
Total Bilirubin: 0.5 mg/dL (ref 0.2–1.2)
Total Protein: 8 g/dL (ref 6.0–8.3)

## 2021-05-23 LAB — HEMOGLOBIN A1C: Hgb A1c MFr Bld: 7.1 % — ABNORMAL HIGH (ref 4.6–6.5)

## 2021-05-23 LAB — TSH: TSH: 1.36 u[IU]/mL (ref 0.35–5.50)

## 2021-05-23 NOTE — Patient Instructions (Signed)
Schedule your complete physical in 3-4 months We'll notify you of your lab results and make any changes if needed Continue to work on healthy diet and regular walking- you're doing great! We'll call you to get your ECHO (ultrasound of the heart) scheduled Call with any questions or concerns Stay Safe!  Stay Healthy! [place]!!

## 2021-05-23 NOTE — Assessment & Plan Note (Signed)
Chronic problem.  Pt reports good control of home CBGs.  UTD on eye exam.  Foot exam done today.  On ARB for renal protection.  Check labs.  Adjust meds prn

## 2021-05-23 NOTE — Assessment & Plan Note (Signed)
Chronic problem.  Tolerating Simvastatin w/o difficulty.  Check labs.  Adjust meds prn

## 2021-05-23 NOTE — Assessment & Plan Note (Signed)
Chronic problem.  Well controlled today.  Check labs due to ARB.  No anticipated med changes.

## 2021-05-23 NOTE — Progress Notes (Signed)
   Subjective:    Patient ID: Karen Gonzales, female    DOB: [birth year], 75 y.o.   MRN: [id number]  HPI DM- chronic problem, on Jardiance 33m daily, Glipizide 2.578mdaily, Metformin 100037mID, Januvia 100m84mily.  UTD on eye exam (attempting to get records).  Due for foot exam, A1C.  Denies symptomatic lows.  + neuropathy of feet.  Home sugars in the 130s.    HTN- chronic problem, on Amlodipine 10mg34mly, Losartan 100mg 63my w/ good control.  No CP.  Some increased SOB w/ exertion.  No HAs, visual changes.  Hyperlipidemia- chronic problem, on Simvastatin 10mg d70m.  Denies abd pain.  Some nausea due to increased GERD, no vomiting.     Review of Systems For ROS see HPI   This visit occurred during the SARS-CoV-2 public health emergency.  Safety protocols were in place, including screening questions prior to the visit, additional usage of staff PPE, and extensive cleaning of exam room while observing appropriate contact time as indicated for disinfecting solutions.      Objective:   Physical Exam Vitals reviewed.  Constitutional:      General: She is not in acute distress.    Appearance: Normal appearance. She is well-developed. She is not ill-appearing.  HENT:     Head: Normocephalic and atraumatic.  Eyes:     Conjunctiva/sclera: Conjunctivae normal.     Pupils: Pupils are equal, round, and reactive to light.  Neck:     Thyroid: No thyromegaly.  Cardiovascular:     Rate and Rhythm: Normal rate and regular rhythm.     Heart sounds: Murmur (II/VI SEM) heard.  Pulmonary:     Effort: Pulmonary effort is normal. No respiratory distress.     Breath sounds: Normal breath sounds.  Abdominal:     General: There is no distension.     Palpations: Abdomen is soft.     Tenderness: There is no abdominal tenderness.  Musculoskeletal:     Cervical back: Normal range of motion and neck supple.     Right lower leg: No edema.     Left lower leg: No edema.  Lymphadenopathy:      Cervical: No cervical adenopathy.  Skin:    General: Skin is warm and dry.  Neurological:     General: No focal deficit present.     Mental Status: She is alert and oriented to person, place, and time.  Psychiatric:        Mood and Affect: Mood normal.        Behavior: Behavior normal.          Assessment & Plan:

## 2021-05-23 NOTE — Assessment & Plan Note (Signed)
Pt had mild aortic stenosis noted in 2019.  She reports increased SOB w/ exertion.  Will repeat ECHO to determine if this is the cause.  Pt expressed understanding and is in agreement w/ plan.

## 2021-05-24 ENCOUNTER — Telehealth: Payer: Self-pay | Admitting: Family Medicine

## 2021-05-24 NOTE — Telephone Encounter (Signed)
Called patient insurance company and patient insurance does not require a PA for and echocardiogram. Returned call to [employer] at [place] and spoke to [place] and notified her that a PA was not needed. Delsa Sale voiced understanding and stated that she will reach out to patient to have her scheduled.

## 2021-05-24 NOTE — Telephone Encounter (Signed)
..  Caller name:  Zigmund Daniel with [facility] [place]? :yes/no: No  Call back number:  [phone]  Provider they see: Birdie Riddle  Reason for call: [facility] needs prior authorization for an echo cardiogram that was ordered by Dr. Birdie Riddle.  Please return their call

## 2021-06-03 ENCOUNTER — Other Ambulatory Visit: Payer: Self-pay | Admitting: Family Medicine

## 2021-06-05 ENCOUNTER — Ambulatory Visit (INDEPENDENT_AMBULATORY_CARE_PROVIDER_SITE_OTHER): Payer: PPO | Admitting: *Deleted

## 2021-06-05 DIAGNOSIS — Z780 Asymptomatic menopausal state: Secondary | ICD-10-CM | POA: Diagnosis not present

## 2021-06-05 DIAGNOSIS — Z1231 Encounter for screening mammogram for malignant neoplasm of breast: Secondary | ICD-10-CM

## 2021-06-05 DIAGNOSIS — Z0000 Encounter for general adult medical examination without abnormal findings: Secondary | ICD-10-CM | POA: Diagnosis not present

## 2021-06-05 NOTE — Progress Notes (Signed)
Subjective:   Karen Gonzales is a 75 y.o. female who presents for Medicare Annual (Subsequent) preventive examination.  I connected with  Karen Gonzales on 06/05/21 by a telephone enabled telemedicine application and verified that I am speaking with the correct person using two identifiers.   I discussed the limitations of evaluation and management by telemedicine. The patient expressed understanding and agreed to proceed.    Review of Systems     Cardiac Risk Factors include: advanced age (>13mn, >>98women);diabetes mellitus;hypertension     Objective:    Today's Vitals   There is no height or weight on file to calculate BMI.  Advanced Directives 06/05/2021 05/30/2020 01/18/2020 05/28/2019 10/24/2017 08/12/2017 07/03/2011  Does Patient Have a Medical Advance Directive? No No No No No No Patient does not have advance directive  Would patient like information on creating a medical advance directive? No - Patient declined No - Patient declined No - Patient declined Yes (MAU/Ambulatory/Procedural Areas - Information given) No - Patient declined No - Patient declined -  Pre-existing out of facility DNR order (yellow form or pink MOST form) - - - - - - No    Current Medications (verified) Outpatient Encounter Medications as of 06/05/2021  Medication Sig   amLODipine (NORVASC) 10 MG tablet TAKE 1 TABLET BY MOUTH EVERY DAY   Blood Glucose Monitoring Suppl (ONE TOUCH ULTRA 2) w/Device KIT Pt uses glucometer to check sugars twice daily. Dx E11.9   empagliflozin (JARDIANCE) 10 MG TABS tablet Take 1 tablet (10 mg total) by mouth daily.   gabapentin (NEURONTIN) 300 MG capsule TAKE 2 CAPSULES BY MOUTH 3 TIMES DAILY.   glipiZIDE (GLUCOTROL XL) 5 MG 24 hr tablet Take 1 tablet (5 mg total) by mouth daily with breakfast. (Patient taking differently: Take 5 mg by mouth daily with breakfast. 1/2 tab)   Lancets (ONETOUCH DELICA PLUS [id number] MISC USE WHEN CHECKING BLOOD SUGAR AS DIRECTED    losartan (COZAAR) 50 MG tablet TAKE 2 TABLETS BY MOUTH EVERY DAY   MELATONIN PO Take by mouth.   meloxicam (MOBIC) 15 MG tablet    metFORMIN (GLUCOPHAGE) 500 MG tablet Take 2 tablets (1,000 mg total) by mouth 2 (two) times daily with a meal.   omeprazole (PRILOSEC) 40 MG capsule Take 1 capsule (40 mg total) by mouth 2 (two) times daily. (Patient taking differently: Take 40 mg by mouth daily.)   ONETOUCH ULTRA test strip CHECK BLOOD SUGAR 1-2 TIMES DAILY   simvastatin (ZOCOR) 10 MG tablet TAKE 1 TABLET BY MOUTH EVERYDAY AT BEDTIME   sitaGLIPtin (JANUVIA) 100 MG tablet Take 100 mg by mouth daily.   No facility-administered encounter medications on file as of 06/05/2021.    Allergies (verified) Nsaids, Reglan [metoclopramide], Estrogens, and Other   History: Past Medical History:  Diagnosis Date   Anti-cardiolipin antibody syndrome ([facility] 06/25/2011   Arthritis    Blood transfusion    had when knee was replaced   Bronchitis    history of   Chills    Complication of anesthesia    Cough    COVID-19    Diabetes mellitus    DVT (deep venous thrombosis) ([facility] 06/25/2011   Fatty liver disease, nonalcoholic    determinde by Ultrasound 11/11   GERD (gastroesophageal reflux disease)    H/O vitamin D deficiency    Heart murmur    Heart murmur    Benign   Hiatal hernia    Hyperlipidemia    Hypertension  Hyponatremia    with diuretic use   Osteopenia    PONV (postoperative nausea and vomiting)    Vertigo    Past Surgical History:  Procedure Laterality Date   ABDOMINAL HYSTERECTOMY     ACHILLES TENDON SURGERY     bilateral    CARPAL TUNNEL RELEASE     bilateral    CHOLECYSTECTOMY  1969   open   CRANIOTOMY  07/09/2011   Procedure: CRANIOTOMY HEMATOMA EVACUATION SUBDURAL;  Surgeon: Karen Gonzales;  Location: [facility];  Service: Neurosurgery;  Laterality: Right;  Excision of Skull Lesion-Right Right Parietal Crani w/ Resection of Skull Tumor (1 1/2 hours - to follow)    [place]     JOINT REPLACEMENT  2004   left knee replacement    KNEE SURGERY     total of 6 surgeries on both knees    REPAIR PERONEAL TENDONS ANKLE     TUBAL LIGATION     TUMOR REMOVAL     on pelvic bone    Family History  Problem Relation Age of Onset   Kidney disease Mother    Osteoporosis Mother    Heart disease Father    Cancer Father        lymphoma   Osteoporosis Father    Healthy Brother    Cancer Maternal Grandmother        lymphoma   Breast cancer Maternal Grandmother    Cancer Paternal Grandmother        breast   Breast cancer Paternal Aunt    Hodgkin's lymphoma Grandchild    Social History   Socioeconomic History   Marital status: Married    Spouse name: Not on file   Number of children: Not on file   Years of education: Not on file   Highest education level: Not on file  Occupational History   Not on file  Tobacco Use   Smoking status: Never   Smokeless tobacco: Never  Vaping Use   Vaping Use: Never used  Substance and Sexual Activity   Alcohol use: No   Drug use: No   Sexual activity: Not Currently  Other Topics Concern   Not on file  Social History Narrative   Not on file   Social Determinants of Health   Financial Resource Strain: Low Risk    Difficulty of Paying Living Expenses: Not hard at all  Food Insecurity: No Food Insecurity   Worried About Charity fundraiser in the Last Year: Never true   [place] in the Last Year: Never true  Transportation Needs: No Transportation Needs   Lack of Transportation (Medical): No   Lack of Transportation (Non-Medical): No  Physical Activity: Insufficiently Active   Days of Exercise per Week: 2 days   Minutes of Exercise per Session: 30 min  Stress: No Stress Concern Present   Feeling of Stress : Not at all  Social Connections: Moderately Integrated   Frequency of Communication with Friends and Family: More than three times a week   Frequency of Social Gatherings  with Friends and Family: More than three times a week   Attends Religious Services: More than 4 times per year   Active Member of [employer] or Organizations: No   Attends Archivist Meetings: Never   Marital Status: Married    Tobacco Counseling Counseling given: Not Answered   Clinical Intake:  Pre-visit preparation completed: Yes  Pain : No/denies pain  Nutritional Risks: None Diabetes: Yes CBG done?: No Did pt. bring in CBG monitor from home?: No  How often do you need to have someone help you when you read instructions, pamphlets, or other written materials from your doctor or pharmacy?: 1 - Never  Diabetic?  yes  Interpreter Needed?: No  Information entered by :: Leroy Kennedy LPN   Activities of Daily Living In your present state of health, do you have any difficulty performing the following activities: 06/05/2021 05/05/2021  Hearing? N N  Vision? N N  Difficulty concentrating or making decisions? N N  Walking or climbing stairs? N N  Dressing or bathing? N N  Doing errands, shopping? N N  Preparing Food and eating ? N -  Using the Toilet? N -  In the past six months, have you accidently leaked urine? N -  Do you have problems with loss of bowel control? N -  Managing your Medications? N -  Managing your Finances? N -  Housekeeping or managing your Housekeeping? N -  Some recent data might be hidden    Patient Care Team: [name], MD as PCP - General (Family Medicine) Earnie Larsson, MD as Consulting Physician (Neurosurgery) Laurence Spates, MD (Inactive) as Consulting Physician (Gastroenterology) Paulla Dolly Tamala Fothergill, DPM as Consulting Physician (Podiatry) Madelin Rear, [facility] as Pharmacist (Pharmacist)  Indicate any recent Medical Services you may have received from other than [facility] providers in the past year (date may be approximate).     Assessment:   This is a routine wellness examination for [place].  Hearing/Vision screen Hearing  Screening - Comments:: No hearing issues Vision Screening - Comments:: Up to date Dr. Sabra Heck  Dietary issues and exercise activities discussed: Current Exercise Habits: Home exercise routine, Type of exercise: walking, Time (Minutes): 30, Frequency (Times/Week): 3, Weekly Exercise (Minutes/Week): 90, Intensity: Mild   Goals Addressed             This Visit's Progress    Patient Stated   On track    Increase activity by walking more     Patient Stated       Maintain current lifestyle       Depression Screen PHQ 2/9 Scores 06/05/2021 05/23/2021 05/05/2021 02/06/2021 10/21/2020 07/05/2020 06/02/2020  PHQ - 2 Score 0 0 0 0 0 0 0  PHQ- 9 Score 0 4 3 - 0 0 3    Fall Risk Fall Risk  06/05/2021 05/23/2021 05/05/2021 02/06/2021 10/21/2020  Falls in the past year? 0 0 0 0 0  Number falls in past yr: 0 - 0 0 0  Injury with Fall? 0 - 0 0 0  Risk for fall due to : - No Fall Risks No Fall Risks - No Fall Risks  Follow up Falls evaluation completed;Falls prevention discussed Falls evaluation completed - Falls evaluation completed -    FALL RISK PREVENTION PERTAINING TO THE HOME:  Any stairs in or around the home? No  If so, are there any without handrails? No  Home free of loose throw rugs in walkways, pet beds, electrical cords, etc? Yes  Adequate lighting in your home to reduce risk of falls? Yes   ASSISTIVE DEVICES UTILIZED TO PREVENT FALLS:  Life alert? No  Use of a cane, walker or w/c? No  Grab bars in the bathroom? Yes  Shower chair or bench in shower? Yes  Elevated toilet seat or a handicapped toilet? Yes   TIMED UP AND GO:  Was the test performed? No .  Cognitive Function:  Normal cognitive status assessed by direct observation by this Nurse Health Advisor. No abnormalities found.   MMSE - Mini Mental State Exam 10/24/2017  Orientation to time 5  Orientation to Place 5  Registration 3  Attention/ Calculation 5  Recall 2  Language- name 2 objects 2  Language-  repeat 1  Language- follow 3 step command 3  Language- read & follow direction 1  Write a sentence 1  Copy design 1  Total score 29     6CIT Screen 05/30/2020  What Year? 0 points  What month? 0 points  What time? 0 points  Count back from 20 0 points  Months in reverse 2 points  Repeat phrase 0 points  Total Score 2    Immunizations Immunization History  Administered Date(s) Administered   Fluad Quad(high Dose 65+) 05/28/2019, 06/02/2020, 05/23/2021   Influenza Split 05/30/2009, 05/09/2010, 05/29/2013   Influenza, High Dose Seasonal PF 07/16/2014, 06/14/2015, 04/28/2018   Influenza,inj,Quad PF,6+ Mos 05/09/2011, 06/14/2015, 05/14/2016, 04/08/2017   Pneumococcal Conjugate-13 10/23/2013   Pneumococcal Polysaccharide-23 11/04/2014   Tdap 02/27/2010, 03/13/2011    TDAP status: Due, Education has been provided regarding the importance of this vaccine. Advised may receive this vaccine at local pharmacy or Health Dept. Aware to provide a copy of the vaccination record if obtained from local pharmacy or Health Dept. Verbalized acceptance and understanding.  Flu Vaccine status: Up to date  Pneumococcal vaccine status: Up to date  Covid-19 vaccine status: Information provided on how to obtain vaccines.   Qualifies for Shingles Vaccine? Yes   Zostavax completed No   Shingrix Completed?: No.    Education has been provided regarding the importance of this vaccine. Patient has been advised to call insurance company to determine out of pocket expense if they have not yet received this vaccine. Advised may also receive vaccine at local pharmacy or Health Dept. Verbalized acceptance and understanding.  Screening Tests Health Maintenance  Topic Date Due   DEXA SCAN  11/19/2019   COVID-19 Vaccine (1) 06/08/2021 (Originally 12/02/1946)   Zoster Vaccines- Shingrix (1 of 2) 08/23/2021 (Originally 06/02/1997)   TETANUS/TDAP  05/23/2022 (Originally 03/12/2021)   MAMMOGRAM  06/17/2021    HEMOGLOBIN A1C  11/21/2021   OPHTHALMOLOGY EXAM  12/01/2021   FOOT EXAM  05/23/2022   COLONOSCOPY (Pts 45-47yr Insurance coverage will need to be confirmed)  08/23/2029   Pneumonia Vaccine 75 Years old  Completed   INFLUENZA VACCINE  Completed   Hepatitis C Screening  Completed   HPV VACCINES  Aged Out    Health Maintenance  Health Maintenance Due  Topic Date Due   DEXA SCAN  11/19/2019    Colorectal cancer screening: Type of screening: Colonoscopy. Completed  . Repeat every 5 years  Mammogram status: Ordered  . Pt provided with contact info and advised to call to schedule appt.   Bone Density status: Ordered  . Pt provided with contact info and advised to call to schedule appt.  Lung Cancer Screening: (Low Dose CT Chest recommended if Age 74-80years, 30 pack-year currently smoking OR have quit w/in 15years.) does not qualify.   Lung Cancer Screening Referral:   Additional Screening:  Hepatitis C Screening: does not qualify; Completed   Vision Screening: Recommended annual ophthalmology exams for early detection of glaucoma and other disorders of the eye. Is the patient up to date with their annual eye exam?  Yes  Who is the provider or what is the name of the office in  which the patient attends annual eye exams? Dr. Sabra Heck If pt is not established with a provider, would they like to be referred to a provider to establish care? No .   Dental Screening: Recommended annual dental exams for proper oral hygiene  Community Resource Referral / Chronic Care Management: CRR required this visit?  No   CCM required this visit?  No      Plan:     I have personally reviewed and noted the following in the patient's chart:   Medical and social history Use of alcohol, tobacco or illicit drugs  Current medications and supplements including opioid prescriptions.  Functional ability and status Nutritional status Physical activity Advanced directives List of other  physicians Hospitalizations, surgeries, and ER visits in previous 12 months Vitals Screenings to include cognitive, depression, and falls Referrals and appointments  In addition, I have reviewed and discussed with patient certain preventive protocols, quality metrics, and best practice recommendations. A written personalized care plan for preventive services as well as general preventive health recommendations were provided to patient.     Leroy Kennedy, LPN   [id number]   Nurse Notes:

## 2021-06-05 NOTE — Patient Instructions (Signed)
Karen Gonzales , Thank you for taking time to come for your Medicare Wellness Visit. I appreciate your ongoing commitment to your health goals. Please review the following plan we discussed and let me know if I can assist you in the future.   Screening recommendations/referrals: Colonoscopy: up to date Mammogram: Education provided Bone Density: Education provided Recommended yearly ophthalmology/optometry visit for glaucoma screening and checkup Recommended yearly dental visit for hygiene and checkup  Vaccinations: Influenza vaccine: up to date Pneumococcal vaccine: up to date Tdap vaccine: Education provided Shingles vaccine: Education provided    Advanced directives: Education provided  Conditions/risks identified:   Next appointment: 09-25-2020 @ 10:00 Dr. Birdie Riddle   Preventive Care 62 Years and Older, Female Preventive care refers to lifestyle choices and visits with your health care provider that can promote health and wellness. What does preventive care include? A yearly physical exam. This is also called an annual well check. Dental exams once or twice a year. Routine eye exams. Ask your health care provider how often you should have your eyes checked. Personal lifestyle choices, including: Daily care of your teeth and gums. Regular physical activity. Eating a healthy diet. Avoiding tobacco and drug use. Limiting alcohol use. Practicing safe sex. Taking low-dose aspirin every day. Taking vitamin and mineral supplements as recommended by your health care provider. What happens during an annual well check? The services and screenings done by your health care provider during your annual well check will depend on your age, overall health, lifestyle risk factors, and family history of disease. Counseling  Your health care provider may ask you questions about your: Alcohol use. Tobacco use. Drug use. Emotional well-being. Home and relationship well-being. Sexual  activity. Eating habits. History of falls. Memory and ability to understand (cognition). Work and work Statistician. Reproductive health. Screening  You may have the following tests or measurements: Height, weight, and BMI. Blood pressure. Lipid and cholesterol levels. These may be checked every 5 years, or more frequently if you are over 71 years old. Skin check. Lung cancer screening. You may have this screening every year starting at age 69 if you have a 30-pack-year history of smoking and currently smoke or have quit within the past 15 years. Fecal occult blood test (FOBT) of the stool. You may have this test every year starting at age 22. Flexible sigmoidoscopy or colonoscopy. You may have a sigmoidoscopy every 5 years or a colonoscopy every 10 years starting at age 68. Hepatitis C blood test. Hepatitis B blood test. Sexually transmitted disease (STD) testing. Diabetes screening. This is done by checking your blood sugar (glucose) after you have not eaten for a while (fasting). You may have this done every 1-3 years. Bone density scan. This is done to screen for osteoporosis. You may have this done starting at age 48. Mammogram. This may be done every 1-2 years. Talk to your health care provider about how often you should have regular mammograms. Talk with your health care provider about your test results, treatment options, and if necessary, the need for more tests. Vaccines  Your health care provider may recommend certain vaccines, such as: Influenza vaccine. This is recommended every year. Tetanus, diphtheria, and acellular pertussis (Tdap, Td) vaccine. You may need a Td booster every 10 years. Zoster vaccine. You may need this after age 49. Pneumococcal 13-valent conjugate (PCV13) vaccine. One dose is recommended after age 24. Pneumococcal polysaccharide (PPSV23) vaccine. One dose is recommended after age 46. Talk to your health care provider about which  screenings and vaccines  you need and how often you need them. This information is not intended to replace advice given to you by your health care provider. Make sure you discuss any questions you have with your health care provider. Document Released: 08/26/2015 Document Revised: 04/18/2016 Document Reviewed: 05/31/2015 Elsevier Interactive Patient Education  2017 [place] Prevention in the Home Falls can cause injuries. They can happen to people of all ages. There are many things you can do to make your home safe and to help prevent falls. What can I do on the outside of my home? Regularly fix the edges of walkways and driveways and fix any cracks. Remove anything that might make you trip as you walk through a door, such as a raised step or threshold. Trim any bushes or trees on the path to your home. Use bright outdoor lighting. Clear any walking paths of anything that might make someone trip, such as rocks or tools. Regularly check to see if handrails are loose or broken. Make sure that both sides of any steps have handrails. Any raised decks and porches should have guardrails on the edges. Have any leaves, snow, or ice cleared regularly. Use sand or salt on walking paths during winter. Clean up any spills in your garage right away. This includes oil or grease spills. What can I do in the bathroom? Use night lights. Install grab bars by the toilet and in the tub and shower. Do not use towel bars as grab bars. Use non-skid mats or decals in the tub or shower. If you need to sit down in the shower, use a plastic, non-slip stool. Keep the floor dry. Clean up any water that spills on the floor as soon as it happens. Remove soap buildup in the tub or shower regularly. Attach bath mats securely with double-sided non-slip rug tape. Do not have throw rugs and other things on the floor that can make you trip. What can I do in the bedroom? Use night lights. Make sure that you have a light by your bed that  is easy to reach. Do not use any sheets or blankets that are too big for your bed. They should not hang down onto the floor. Have a firm chair that has side arms. You can use this for support while you get dressed. Do not have throw rugs and other things on the floor that can make you trip. What can I do in the kitchen? Clean up any spills right away. Avoid walking on wet floors. Keep items that you use a lot in easy-to-reach places. If you need to reach something above you, use a strong step stool that has a grab bar. Keep electrical cords out of the way. Do not use floor polish or wax that makes floors slippery. If you must use wax, use non-skid floor wax. Do not have throw rugs and other things on the floor that can make you trip. What can I do with my stairs? Do not leave any items on the stairs. Make sure that there are handrails on both sides of the stairs and use them. Fix handrails that are broken or loose. Make sure that handrails are as long as the stairways. Check any carpeting to make sure that it is firmly attached to the stairs. Fix any carpet that is loose or worn. Avoid having throw rugs at the top or bottom of the stairs. If you do have throw rugs, attach them to the floor with carpet  tape. Make sure that you have a light switch at the top of the stairs and the bottom of the stairs. If you do not have them, ask someone to add them for you. What else can I do to help prevent falls? Wear shoes that: Do not have high heels. Have rubber bottoms. Are comfortable and fit you well. Are closed at the toe. Do not wear sandals. If you use a stepladder: Make sure that it is fully opened. Do not climb a closed stepladder. Make sure that both sides of the stepladder are locked into place. Ask someone to hold it for you, if possible. Clearly mark and make sure that you can see: Any grab bars or handrails. First and last steps. Where the edge of each step is. Use tools that help you  move around (mobility aids) if they are needed. These include: Canes. Walkers. Scooters. Crutches. Turn on the lights when you go into a dark area. Replace any light bulbs as soon as they burn out. Set up your furniture so you have a clear path. Avoid moving your furniture around. If any of your floors are uneven, fix them. If there are any pets around you, be aware of where they are. Review your medicines with your doctor. Some medicines can make you feel dizzy. This can increase your chance of falling. Ask your doctor what other things that you can do to help prevent falls. This information is not intended to replace advice given to you by your health care provider. Make sure you discuss any questions you have with your health care provider. Document Released: 05/26/2009 Document Revised: 01/05/2016 Document Reviewed: 09/03/2014 Elsevier Interactive Patient Education  2017 [employer].

## 2021-06-06 ENCOUNTER — Other Ambulatory Visit: Payer: Self-pay

## 2021-06-06 ENCOUNTER — Ambulatory Visit (INDEPENDENT_AMBULATORY_CARE_PROVIDER_SITE_OTHER): Payer: PPO

## 2021-06-06 DIAGNOSIS — R011 Cardiac murmur, unspecified: Secondary | ICD-10-CM

## 2021-06-06 LAB — ECHOCARDIOGRAM COMPLETE
AR max vel: 0.84 cm2
AV Area VTI: 0.67 cm2
AV Area mean vel: 0.66 cm2
AV Mean grad: 25.7 mmHg
AV Peak grad: 45 mmHg
Ao pk vel: 3.35 m/s
Area-P 1/2: 2.52 cm2
Calc EF: 65.4 %
S' Lateral: 1.8 cm
Single Plane A2C EF: 67.6 %
Single Plane A4C EF: 63.2 %

## 2021-06-07 ENCOUNTER — Other Ambulatory Visit: Payer: Self-pay

## 2021-06-07 DIAGNOSIS — I350 Nonrheumatic aortic (valve) stenosis: Secondary | ICD-10-CM

## 2021-06-14 ENCOUNTER — Telehealth: Payer: Self-pay

## 2021-06-14 NOTE — Progress Notes (Signed)
Chronic Care Management Pharmacy Assistant   Name: Karen Gonzales  MRN: [id number] DOB: 1946/06/20   Reason for Encounter: Disease State - Diabetes Call     Recent office visits:   06/05/21 Annual Medicare Wellness Completed  05/23/21 Annye Asa, [facility] (PCP) - Family Medicine - Hypertension - las were ordered. ECHO ordered. Flu vaccine administered. Follow up in 3-4 months for a physical.   05/05/21 Maximiano Coss, NP - Urinary urgency - labs were ordered. phenazopyridine (PYRIDIUM) 97 MG tablet and sulfamethoxazole-trimethoprim (BACTRIM DS) 800-160 MG tablet were prescribed. Follow up if no improvement.   Recent consult visits:  None noted.   Hospital visits:  None in previous 6 months  Medications: Outpatient Encounter Medications as of 06/14/2021  Medication Sig   amLODipine (NORVASC) 10 MG tablet TAKE 1 TABLET BY MOUTH EVERY DAY   Blood Glucose Monitoring Suppl (ONE TOUCH ULTRA 2) w/Device KIT Pt uses glucometer to check sugars twice daily. Dx E11.9   empagliflozin (JARDIANCE) 10 MG TABS tablet Take 1 tablet (10 mg total) by mouth daily.   gabapentin (NEURONTIN) 300 MG capsule TAKE 2 CAPSULES BY MOUTH 3 TIMES DAILY.   glipiZIDE (GLUCOTROL XL) 5 MG 24 hr tablet Take 1 tablet (5 mg total) by mouth daily with breakfast. (Patient taking differently: Take 5 mg by mouth daily with breakfast. 1/2 tab)   Lancets (ONETOUCH DELICA PLUS [id number]) MISC USE WHEN CHECKING BLOOD SUGAR AS DIRECTED   losartan (COZAAR) 50 MG tablet TAKE 2 TABLETS BY MOUTH EVERY DAY   MELATONIN PO Take by mouth.   meloxicam (MOBIC) 15 MG tablet    metFORMIN (GLUCOPHAGE) 500 MG tablet Take 2 tablets (1,000 mg total) by mouth 2 (two) times daily with a meal.   omeprazole (PRILOSEC) 40 MG capsule Take 1 capsule (40 mg total) by mouth 2 (two) times daily. (Patient taking differently: Take 40 mg by mouth daily.)   ONETOUCH ULTRA test strip CHECK BLOOD SUGAR 1-2 TIMES DAILY   simvastatin (ZOCOR) 10 MG tablet  TAKE 1 TABLET BY MOUTH EVERYDAY AT BEDTIME   sitaGLIPtin (JANUVIA) 100 MG tablet Take 100 mg by mouth daily.   No facility-administered encounter medications on file as of 06/14/2021.    Current antihyperglycemic regimen:  Metformin 1000 mg IR twice daily with meals Jardiance 10 mg once daily (patient assistance)  Januvia 100 mg once daily (patient assistance) Glipizide 10 mg XL once daily with breakfast (had been splitting after suffering lows)   What recent interventions/DTPs have been made to improve glycemic control:  Patient denied any recent changes to her medication regimen.   Have there been any recent hospitalizations or ED visits since last visit with CPP?  Patient has not had any hospitalizations or ED visits since last visit with CPP.  Patient denies hypoglycemic symptoms, including Pale, Sweaty, Shaky, Hungry, Nervous/irritable, and Vision changes   Patient denies hyperglycemic symptoms, including blurry vision, excessive thirst, fatigue, polyuria, and weakness   How often are you checking your blood sugar? Patient reported she is checking her blood sugars daily and states they are doing well.   What are your blood sugars ranging?  Fasting: 130 Before meals:  After meals:  Bedtime:   During the week, how often does your blood glucose drop below 70?  Patient denied any glucose readings of 70 or lower.   Are you checking your feet daily/regularly? Patient reported she does check her feet regularly.     Adherence Review: Is the patient currently on a  STATIN medication? Yes Is the patient currently on ACE/ARB medication? Yes Does the patient have >5 day gap between last estimated fill dates? No  Metformin 1000 mg IR twice daily with meals - last filled 04/2721 90 days  Jardiance 10 mg once daily (patient assistance)  Januvia 100 mg once daily (patient assistance) Glipizide 10 mg XL once daily with breakfast (had been splitting after suffering lows) - last  filled 03/14/21 90 days   Care Gaps  AWV: done 06/05/21 Colonoscopy: done 08/24/19 DM Eye Exam: due 12/01/21 DM Foot Exam: due 05/23/2022 Microalbumin: N/A HbgAIC: done 05/23/21 (7.1) DEXA: done 11/18/17 and ordered Mammogram: done 06/17/20 and ordered    Star Rating Drugs: Simvastatin (ZOCOR) 10 MG tablet - last filled 06/05/21 90 days  Metformin (GLUCOPHAGE) 500 MG tablet - last filled 05/09/21 90 days  Losartan (COZAAR) 50 MG tablet - last filled 05/22/21 90 days  Glipizide (GLUCOTROL XL) 5 MG 24 hr tablet - last filled 03/14/21 90 days   Future Appointments  Date Time Provider [place]  06/21/2021  9:00 AM [facility] CCM PHARMACIST [facility] P[facility]  07/17/2021 10:40 AM Buford Dresser, MD [facility]  09/25/2021 10:00 AM [name], MD [facility]   Patient reported she turned in her PAP paperwork for Jardiance and Januvia. She will contact me and let me know the outcome once she is aware.   Jobe Gibbon, [place] Pharmacist Assistant  [phone]  Time Spent: 45 minutes

## 2021-06-15 ENCOUNTER — Other Ambulatory Visit: Payer: Self-pay | Admitting: Family Medicine

## 2021-06-15 DIAGNOSIS — E1140 Type 2 diabetes mellitus with diabetic neuropathy, unspecified: Secondary | ICD-10-CM

## 2021-06-21 ENCOUNTER — Ambulatory Visit (INDEPENDENT_AMBULATORY_CARE_PROVIDER_SITE_OTHER): Payer: PPO

## 2021-06-21 DIAGNOSIS — I10 Essential (primary) hypertension: Secondary | ICD-10-CM

## 2021-06-21 DIAGNOSIS — E1140 Type 2 diabetes mellitus with diabetic neuropathy, unspecified: Secondary | ICD-10-CM

## 2021-06-21 NOTE — Progress Notes (Signed)
Chronic Care Management Pharmacy Note  06/21/2021 Name:  Karen Gonzales MRN:  [id number] DOB:  1945/09/22  Summary: -Will send Jardiance 25 mg for 2023 patient assistance renewal -Patient to bring in patient assistance application for Januvia 100 mg renewal  Subjective: SULTANA TIERNEY is an 75 y.o. year old female who is a primary patient of Tabori, Aundra Millet, MD.  The CCM team was consulted for assistance with disease management and care coordination needs.    Engaged with patient by telephone for follow up visit in response to provider referral for pharmacy case management and/or care coordination services.   Consent to Services:  The patient was given information about Chronic Care Management services, agreed to services, and gave verbal consent prior to initiation of services.  Please see initial visit note for detailed documentation.   Patient Care Team: [name], MD as PCP - General (Family Medicine) Earnie Larsson, MD as Consulting Physician (Neurosurgery) Laurence Spates, MD (Inactive) as Consulting Physician (Gastroenterology) Wallene Huh, DPM as Consulting Physician (Podiatry) Madelin Rear, [facility] as Pharmacist (Pharmacist)  Objective:  Lab Results  Component Value Date   CREATININE 0.85 05/23/2021   CREATININE 0.79 10/21/2020   CREATININE 0.73 06/02/2020    Lab Results  Component Value Date   HGBA1C 7.1 (H) 05/23/2021   Last diabetic Eye exam:  Lab Results  Component Value Date/Time   HMDIABEYEEXA No Retinopathy 10/30/2019 12:00 AM    Last diabetic Foot exam: No results found for: HMDIABFOOTEX      Component Value Date/Time   CHOL 146 05/23/2021 1123   TRIG 179.0 (H) 05/23/2021 1123   HDL 49.30 05/23/2021 1123   CHOLHDL 3 05/23/2021 1123   VLDL 35.8 05/23/2021 1123   LDLCALC 61 05/23/2021 1123   LDLCALC 75 10/24/2017 1559   LDLDIRECT 104.0 04/08/2017 1125    Hepatic Function Latest Ref Rng & Units 05/23/2021 06/02/2020 11/30/2019   Total Protein 6.0 - 8.3 g/dL 8.0 7.5 7.7  Albumin 3.5 - 5.2 g/dL 4.6 4.5 4.5  AST 0 - 37 U/L 30 29 44(H)  ALT 0 - 35 U/L 35 40(H) 52(H)  Alk Phosphatase 39 - 117 U/L 53 51 53  Total Bilirubin 0.2 - 1.2 mg/dL 0.5 0.6 0.6  Bilirubin, Direct 0.0 - 0.3 mg/dL 0.1 0.1 0.1    Lab Results  Component Value Date/Time   TSH 1.36 05/23/2021 11:23 AM   TSH 1.76 06/02/2020 10:28 AM    CBC Latest Ref Rng & Units 05/23/2021 06/02/2020 11/30/2019  WBC 4.0 - 10.5 K/uL 7.6 7.5 7.8  Hemoglobin 12.0 - 15.0 g/dL 11.8(L) 12.5 12.6  Hematocrit 36.0 - 46.0 % 37.2 37.9 38.3  Platelets 150.0 - 400.0 K/uL 217.0 215.0 218.0    No results found for: VD25OH  Clinical ASCVD:  The ASCVD Risk score (Arnett DK, et al., 2019) failed to calculate for the following reasons:   The systolic blood pressure is missing    DEXA-upcoming 2023 Mammogram-scheduled 2022  Echo 05/2021 - moderate to severe aortic stenosis, referred to cardiology    Social History   Tobacco Use  Smoking Status Never  Smokeless Tobacco Never   BP Readings from Last 3 Encounters:  05/23/21 132/78  05/05/21 140/80  10/21/20 118/78   Pulse Readings from Last 3 Encounters:  05/23/21 81  05/05/21 85  10/21/20 75   Wt Readings from Last 3 Encounters:  05/23/21 185 lb (83.9 kg)  05/05/21 187 lb (84.8 kg)  10/21/20 188 lb 6.4 oz (  85.5 kg)    Assessment: Review of patient past medical history, allergies, medications, health status, including review of consultants reports, laboratory and other test data, was performed as part of comprehensive evaluation and provision of chronic care management services.   SDOH:  (Social Determinants of Health) assessments and interventions performed: Yes   CCM Care Plan  Allergies  Allergen Reactions   Nsaids Other (See Comments)   Reglan [Metoclopramide] Other (See Comments)    Tremor    Estrogens Rash    Topical patch caused the rash   Other Rash    Medications Reviewed Today      Reviewed by Madelin Rear, [facility] (Pharmacist) on 06/21/21 at [place] List Status: <None>   Medication Order Taking? Sig Documenting Provider Last Dose Status Informant  amLODipine (NORVASC) 10 MG tablet [id number] Yes TAKE 1 TABLET BY MOUTH EVERY DAY [name], MD  Active   Blood Glucose Monitoring Suppl (ONE TOUCH ULTRA 2) w/Device KIT [id number]  Pt uses glucometer to check sugars twice daily. Dx E11.9 [name], MD  Active   empagliflozin (JARDIANCE) 10 MG TABS tablet [id number] Yes Take 1 tablet (10 mg total) by mouth daily. [name], MD  Active            Med Note Madelin Rear   Wed Jun 21, 2021  9:25 AM) Patient assistance will be processed for Jardiance 25 mg for 2023 renewal  gabapentin (NEURONTIN) 300 MG capsule [id number] Yes TAKE 2 CAPSULES BY MOUTH 3 TIMES DAILY. [name], MD  Active    Discontinued 06/21/21 [phone] (Dose change)   glipiZIDE (GLUCOTROL XL) 5 MG 24 hr tablet [id number] Yes Take 1 tablet (5 mg total) by mouth daily with breakfast. [name], MD Taking Active   Lancets (ONETOUCH DELICA PLUS [id number]) [place] [id number]  USE WHEN CHECKING BLOOD SUGAR AS DIRECTED [name], MD  Active   losartan (COZAAR) 50 MG tablet [id number] Yes TAKE 2 TABLETS BY MOUTH EVERY DAY [name], MD  Active   MELATONIN PO [id number] Yes Take by mouth. [provider]  Active   meloxicam (MOBIC) 15 MG tablet [id number]   [provider]  Active   metFORMIN (GLUCOPHAGE) 500 MG tablet [id number] Yes Take 2 tablets (1,000 mg total) by mouth 2 (two) times daily with a meal. [name], MD  Active   omeprazole (PRILOSEC) 40 MG capsule [id number] Yes Take 1 capsule (40 mg total) by mouth 2 (two) times daily.  Patient taking differently: Take 40 mg by mouth daily.   [name], MD  Active   [facility] ULTRA test strip [id number]  CHECK BLOOD SUGAR 1-2 TIMES DAILY [name], MD  Active   simvastatin (ZOCOR)  10 MG tablet [id number] Yes TAKE 1 TABLET BY MOUTH EVERYDAY AT BEDTIME [name], MD  Active   sitaGLIPtin (JANUVIA) 100 MG tablet [id number] Yes Take 100 mg by mouth daily. [provider]  Active             Patient Active Problem List   Diagnosis Date Noted   Allergic rhinitis 10/21/2020   Dysphagia 10/21/2020   Fatty liver 10/21/2020   Gastro-esophageal reflux disease without esophagitis 10/21/2020   Iron deficiency anemia 10/21/2020   Osteopenia 10/21/2020   Personal history of colonic polyps 10/21/2020   Pure hypercholesterolemia 10/21/2020   Vitamin D deficiency 10/21/2020   Dental disorder 03/02/2020   Chronic venous insufficiency 10/09/2019   Varicose veins  of left lower extremity with pain 08/31/2019   Obesity (BMI 30-39.9) 08/31/2019   Heart murmur 10/24/2017   Physical exam 10/10/2016   Gastroparesis due to DM ([place]) 06/05/2016   Right shoulder pain 02/03/2016   Type 2 diabetes mellitus with diabetic neuropathy, without long-term current use of insulin ([place]) 11/07/2015   Hyperlipidemia associated with type 2 diabetes mellitus ([place]) 11/07/2015   Skull mass 07/02/2011   Fibromyalgia 06/25/2011   Essential hypertension 09/24/2007    Immunization History  Administered Date(s) Administered   Fluad Quad(high Dose 65+) 05/28/2019, 06/02/2020, 05/23/2021   Influenza Split 05/30/2009, 05/09/2010, 05/29/2013   Influenza, High Dose Seasonal PF 07/16/2014, 06/14/2015, 04/28/2018   Influenza,inj,Quad PF,6+ Mos 05/09/2011, 06/14/2015, 05/14/2016, 04/08/2017   Pneumococcal Conjugate-13 10/23/2013   Pneumococcal Polysaccharide-23 11/04/2014   Tdap 02/27/2010, 03/13/2011    Conditions to be addressed/monitored: GERD, HLD, HTN, DM, Osteopenia  Care Plan : [place]  Updates made by Madelin Rear, [facility] since 06/21/2021 12:00 AM     Problem: HLD HTN DMII Obesity      Long-Range Goal: Disease Management   Start Date: 10/20/2020  Expected End  Date: 10/20/2021  Recent Progress: On track  Priority: High  Note:   Current Barriers:  Unable to maintain control of diabetes  Pharmacist Clinical Goal(s):  Patient will verbalize ability to afford treatment regimen achieve adherence to monitoring guidelines and medication adherence to achieve therapeutic efficacy contact provider office for questions/concerns as evidenced notation of same in electronic health record through collaboration with PharmD and provider.   Interventions: 1:1 collaboration with [name], MD regarding development and update of comprehensive plan of care as evidenced by provider attestation and co-signature Inter-disciplinary care team collaboration (see longitudinal plan of care) Comprehensive medication review performed; medication list updated in electronic medical record  Diabetes (A1c goal <7%) -Not ideally controlled - near goal with a1c 7.1% 05/2021 -Current medications: Metformin 1000 mg IR twice daily with meals Jardiance 10 mg once daily (patient assistance)  Januvia 100 mg once daily (patient assistance) Glipizide 5 mg XL once daily with breakfast  -Current home glucose readings fasting glucose:  October 2023: 120-130 FBG August 2022: 123, 125, 140, 161, 123 May 2022: 118-180  -Denies hypoglycemic/hyperglycemic symptoms -Current exercise: no formal exercise - limited due to SOB, stays active with grandchildren -Educated on A1c and blood sugar goals; Benefits of routine self-monitoring of blood sugar; -Recommended to continue current medication Pt has received Januvia PAP paper from [employer], will drop off at office for 2023 renewal Jardiance PAP received, will send increased dose for Jardiance 25 mg once daily, prepared for PCP signing  Hypertension (BP goal <140/90) -Controlled -Current treatment: Amlodipine 10 mg once daily  Losartan 50 mg once daily   -Current home readings: none provided -Current dietary habits: no changes. High  carb, high salt  -Current exercise habits: limited  -Denies hypotensive/hypertensive symptoms -Educated on BP goals and benefits of medications for prevention of heart attack, stroke and kidney damage; Symptoms of hypotension and importance of maintaining adequate hydration; -Counseled to monitor BP at home 1-2x/wk, document, and provide log at future appointments -Recommended to continue current medication  Patient Goals/Self-Care Activities Patient will:  - take medications as prescribed Minimize meloxicam use, consider checking BG 2 hours after dinner 2-3x/wk to help assess blood sugar control throughout the day  Medication Assistance: Lind Guest and Januvia through patient assistance     Patient's preferred pharmacy is:  [id number] [place], [place] - [address] [place] [address] Phone: [phone] Fax: [phone]  Pt endorses 100% compliance  Follow Up:  Patient agrees to Care Plan and Follow-up.  Plan:  1 month DM/pt call to check onreadings and completion of PAP re-enrollment app (Januvia). 3-4 month CPP telephone f/u.  Future Appointments  Date Time Provider [place]  07/17/2021 10:40 AM Buford Dresser, MD [facility]CVD [facility]  07/25/2021  9:10 AM GI-BCG MM 2 GI-BCGMM GI-BREAST CE  09/25/2021 10:00 AM [name], MD [facility] PEC  11/21/2021  9:30 AM GI-BCG DX DEXA 1 GI-BCGDG GI-BREAST CE   Madelin Rear, PharmD, CPP Clinical Pharmacist Practitioner  [facility]  [phone]

## 2021-06-21 NOTE — Patient Instructions (Addendum)
Ms. Druck,  Thank you for talking with me today. I have included our care plan/goals in the following pages.   Please review and call me at [phone] with any questions.  Thanks! Ellin Mayhew, PharmD, CPP Clinical Pharmacist Practitioner  [phone]  Care Plan : [place]  Updates made by Madelin Rear, [facility] since 06/21/2021 12:00 AM     Problem: HLD HTN DMII Obesity      Long-Range Goal: Disease Management   Start Date: 10/20/2020  Expected End Date: 10/20/2021  Recent Progress: On track  Priority: High  Note:   Current Barriers:  Unable to maintain control of diabetes  Pharmacist Clinical Goal(s):  Patient will verbalize ability to afford treatment regimen achieve adherence to monitoring guidelines and medication adherence to achieve therapeutic efficacy contact provider office for questions/concerns as evidenced notation of same in electronic health record through collaboration with PharmD and provider.   Interventions: 1:1 collaboration with [name], MD regarding development and update of comprehensive plan of care as evidenced by provider attestation and co-signature Inter-disciplinary care team collaboration (see longitudinal plan of care) Comprehensive medication review performed; medication list updated in electronic medical record  Diabetes (A1c goal <7%) -Not ideally controlled - near goal with a1c 7.1% 05/2021 -Current medications: Metformin 1000 mg IR twice daily with meals Jardiance 10 mg once daily (patient assistance)  Januvia 100 mg once daily (patient assistance) Glipizide 5 mg XL once daily with breakfast  -Current home glucose readings fasting glucose:  October 2023: 120-130 FBG August 2022: 123, 125, 140, 161, 123 May 2022: 118-180  -Denies hypoglycemic/hyperglycemic symptoms -Current exercise: no formal exercise - limited due to SOB, stays active with grandchildren -Educated on A1c and blood sugar  goals; Benefits of routine self-monitoring of blood sugar; -Recommended to continue current medication Pt has received Januvia PAP paper from [employer], will drop off at office for 2023 renewal Jardiance PAP received, will send increased dose for Jardiance 25 mg once daily   Hypertension (BP goal <140/90) -Controlled -Current treatment: Amlodipine 10 mg once daily  Losartan 50 mg once daily   -Current home readings: none provided -Current dietary habits: no changes. High carb, high salt  -Current exercise habits: limited  -Denies hypotensive/hypertensive symptoms -Educated on BP goals and benefits of medications for prevention of heart attack, stroke and kidney damage; Symptoms of hypotension and importance of maintaining adequate hydration; -Counseled to monitor BP at home 1-2x/wk, document, and provide log at future appointments -Recommended to continue current medication  Patient Goals/Self-Care Activities Patient will:  - take medications as prescribed Minimize meloxicam use, consider checking BG 2 hours after dinner 2-3x/wk to help assess blood sugar control throughout the day  Medication Assistance: Lind Guest and Januvia through patient assistance     The patient verbalized understanding of instructions provided today and agreed to receive a MyChart copy of patient instruction and/or educational materials. Telephone follow up appointment with pharmacy team member scheduled for: See next appointment with "Care Management Staff" under "What's Next" below.

## 2021-06-26 ENCOUNTER — Telehealth: Payer: Self-pay

## 2021-06-26 NOTE — Chronic Care Management (AMB) (Signed)
    Chronic Care Management Pharmacy Assistant   Name: LATIVIA VELIE  MRN: [id number] DOB: October 05, 1945   Reason for Encounter: [place] Adherence    Spoke to patient and updated blood pressure from last home reading on Gap report. Also updated most recent AIC per chart review. Patient also advised to schedule AWV. Patient voiced understanding and GAP report updated accordingly.    Jobe Gibbon, [place] Pharmacist Assistant  [phone]  Time Spent:  11 minutes

## 2021-06-29 ENCOUNTER — Other Ambulatory Visit: Payer: Self-pay | Admitting: Family Medicine

## 2021-07-12 DIAGNOSIS — I10 Essential (primary) hypertension: Secondary | ICD-10-CM | POA: Diagnosis not present

## 2021-07-12 DIAGNOSIS — E1140 Type 2 diabetes mellitus with diabetic neuropathy, unspecified: Secondary | ICD-10-CM | POA: Diagnosis not present

## 2021-07-17 ENCOUNTER — Ambulatory Visit (INDEPENDENT_AMBULATORY_CARE_PROVIDER_SITE_OTHER): Payer: PPO | Admitting: Cardiology

## 2021-07-17 ENCOUNTER — Other Ambulatory Visit: Payer: Self-pay

## 2021-07-17 ENCOUNTER — Encounter (HOSPITAL_BASED_OUTPATIENT_CLINIC_OR_DEPARTMENT_OTHER): Payer: Self-pay | Admitting: Cardiology

## 2021-07-17 VITALS — BP 168/72 | HR 79 | Ht 65.0 in | Wt 188.5 lb

## 2021-07-17 DIAGNOSIS — Z7189 Other specified counseling: Secondary | ICD-10-CM | POA: Diagnosis not present

## 2021-07-17 DIAGNOSIS — E1140 Type 2 diabetes mellitus with diabetic neuropathy, unspecified: Secondary | ICD-10-CM

## 2021-07-17 DIAGNOSIS — E782 Mixed hyperlipidemia: Secondary | ICD-10-CM

## 2021-07-17 DIAGNOSIS — I350 Nonrheumatic aortic (valve) stenosis: Secondary | ICD-10-CM | POA: Diagnosis not present

## 2021-07-17 DIAGNOSIS — Z9189 Other specified personal risk factors, not elsewhere classified: Secondary | ICD-10-CM

## 2021-07-17 DIAGNOSIS — I10 Essential (primary) hypertension: Secondary | ICD-10-CM

## 2021-07-17 NOTE — Progress Notes (Signed)
Cardiology Office Note:    Date:  07/17/2021   ID:  Karen Gonzales, DOB [birth year], MRN [id number]  PCP:  [name], MD  Cardiologist:  Buford Dresser, MD  Referring MD: [name], MD   CC: new patient evaluation for aortic stenosis   History of Present Illness:    Karen Gonzales is a 75 y.o. female with a hx of hypertension, hyperlipidemia, diabetes, DVT, GERD, hyponatremia (with diuretic use), fatty liver disease (nonalcoholic), anti-cardiolipin antibody syndrome, arthritis, and COVID-19, who is seen as a new consult at the request of [name], MD for the evaluation and management of aortic valve stenosis.  Echo on 06/06/21: The aortic valve is calcified. Aortic valve regurgitation is not visualized. Moderate to severe aortic valve stenosis (Susipicion for Paradoxical Low flow low gradient aortic stenosis). Aortic valve area, by VTI measures 0.67 cm. Aortic valve mean gradient measures 25.7 mmHg. Aortic valve Vmax measures 3.35 m/s, Indexed AVA 0.35 cmsq/msq, SVI 26, DI 0.26.   Cardiovascular risk factors: Prior clinical ASCVD: None Comorbid conditions: Hypertension, hyperlipidemia, diabetes (dx in her 44'R) Metabolic syndrome/Obesity: Highest adult weight 213 lbs. Chronic inflammatory conditions: None Tobacco use history: Never Family history: "Whole family" has diabetes. Father died of heart failure at 56 yo. Mother died at 71 yo due to kidney failure. Two brothers, one died from diabetes, and one living with diabetes and other health issues. Prior cardiac testing and/or incidental findings on other testing (ie coronary calcium): Ultrasound 2019 Exercise level: Helps care for 4 great-grandchildren often. Previously walked her dog regularly. Has about 4 steps to their home. She will become a little winded with longer flights of stairs. Occasionally she needs to sit down and rest during activity, such as mopping the floor.   She is accompanied  by her husband. Overall, she appears well.   For years she has known about having a heart murmur, previously this was heard only periodically.  At one time while she was a passenger in the car, she suddenly felt like she was blacking out and leaning to the right. She was sitting still at the time. When she arrived home she developed emesis.  She endorses becoming lightheaded more often than she did previously.  Occasionally she notices swelling in her abdomen.  She denies any palpitations, or chest pain. No headaches, orthopnea, or PND.  Past Medical History:  Diagnosis Date   Anti-cardiolipin antibody syndrome ([place]) 06/25/2011   Arthritis    Blood transfusion    had when knee was replaced   Bronchitis    history of   Chills    Complication of anesthesia    Cough    COVID-19    Diabetes mellitus    DVT (deep venous thrombosis) ([place]) 06/25/2011   Fatty liver disease, nonalcoholic    determinde by Ultrasound 11/11   GERD (gastroesophageal reflux disease)    H/O vitamin D deficiency    Heart murmur    Heart murmur    Benign   Hiatal hernia    Hyperlipidemia    Hypertension    Hyponatremia    with diuretic use   Osteopenia    PONV (postoperative nausea and vomiting)    Vertigo     Past Surgical History:  Procedure Laterality Date   ABDOMINAL HYSTERECTOMY     ACHILLES TENDON SURGERY     bilateral    CARPAL TUNNEL RELEASE     bilateral    CHOLECYSTECTOMY  1969   open  CRANIOTOMY  07/09/2011   Procedure: CRANIOTOMY HEMATOMA EVACUATION SUBDURAL;  Surgeon: Cooper Render Pool;  Location: [facility];  Service: Neurosurgery;  Laterality: Right;  Excision of Skull Lesion-Right Right Parietal Crani w/ Resection of Skull Tumor (1 1/2 hours - to follow)   [place] REPLACEMENT  2004   left knee replacement    KNEE SURGERY     total of 6 surgeries on both knees    REPAIR PERONEAL TENDONS ANKLE     TUBAL LIGATION     TUMOR REMOVAL     on  pelvic bone     Current Medications: Current Outpatient Medications on File Prior to Visit  Medication Sig   amLODipine (NORVASC) 10 MG tablet TAKE 1 TABLET BY MOUTH EVERY DAY   Blood Glucose Monitoring Suppl (ONE TOUCH ULTRA 2) w/Device KIT Pt uses glucometer to check sugars twice daily. Dx E11.9   empagliflozin (JARDIANCE) 10 MG TABS tablet Take 1 tablet (10 mg total) by mouth daily.   gabapentin (NEURONTIN) 300 MG capsule TAKE 2 CAPSULES BY MOUTH 3 TIMES DAILY.   glipiZIDE (GLUCOTROL XL) 5 MG 24 hr tablet Take 1 tablet (5 mg total) by mouth daily with breakfast.   Lancets (ONETOUCH DELICA PLUS [id number]) MISC USE WHEN CHECKING BLOOD SUGAR AS DIRECTED   losartan (COZAAR) 50 MG tablet TAKE 2 TABLETS BY MOUTH EVERY DAY   MELATONIN PO Take by mouth.   meloxicam (MOBIC) 15 MG tablet    metFORMIN (GLUCOPHAGE) 500 MG tablet Take 2 tablets (1,000 mg total) by mouth 2 (two) times daily with a meal.   omeprazole (PRILOSEC) 40 MG capsule Take 1 capsule (40 mg total) by mouth 2 (two) times daily. (Patient taking differently: Take 40 mg by mouth daily.)   ONETOUCH ULTRA test strip CHECK BLOOD SUGAR 1-2 TIMES DAILY   simvastatin (ZOCOR) 10 MG tablet TAKE 1 TABLET BY MOUTH EVERYDAY AT BEDTIME   sitaGLIPtin (JANUVIA) 100 MG tablet Take 100 mg by mouth daily.   No current facility-administered medications on file prior to visit.     Allergies:   Nsaids, Reglan [metoclopramide], Estrogens, and Other   Social History   Tobacco Use   Smoking status: Never   Smokeless tobacco: Never  Vaping Use   Vaping Use: Never used  Substance Use Topics   Alcohol use: No   Drug use: No    Family History: family history includes Breast cancer in her maternal grandmother and paternal aunt; Cancer in her father, maternal grandmother, and paternal grandmother; Healthy in her brother; Heart disease in her father; Hodgkin's lymphoma in her grandchild; Kidney disease in her mother; Osteoporosis in her father and  mother.  ROS:   Please see the history of present illness.  Additional pertinent ROS: Constitutional: Negative for chills, fever, night sweats, unintentional weight loss  HENT: Negative for ear pain and hearing loss.   Eyes: Negative for loss of vision and eye pain.  Respiratory: Negative for cough, sputum, wheezing. Positive for exertional shortness of breath.   Cardiovascular: See HPI. Gastrointestinal: Negative for abdominal pain, melena, and hematochezia.  Genitourinary: Negative for dysuria and hematuria.  Musculoskeletal: Negative for falls and myalgias.  Skin: Negative for itching and rash.  Neurological: Negative for focal weakness, focal sensory changes and loss of consciousness. Positive for lightheadedness. Endo/Heme/Allergies: Does not bruise/bleed easily.     EKGs/Labs/Other Studies Reviewed:    The following studies were reviewed today:  Echo 06/06/2021:  1. Left  ventricular ejection fraction, by estimation, is 55 to 60%. The  left ventricle has normal function. The left ventricle has no regional  wall motion abnormalities. There is mild asymmetric left ventricular  hypertrophy of the posterior segment.  Left ventricular diastolic parameters are consistent with Grade I  diastolic dysfunction (impaired relaxation).   2. Right ventricular systolic function is normal. The right ventricular  size is normal.   3. Left atrial size was moderately dilated.   4. The mitral valve is normal in structure. No evidence of mitral valve  regurgitation. No evidence of mitral stenosis.   5. The aortic valve is calcified. Aortic valve regurgitation is not  visualized. Moderate to severe aortic valve stenosis (Susipicion for  Paradoxical Low flow low gradient aortic stenosis). Aortic valve area, by  VTI measures 0.67 cm. Aortic valve mean  gradient measures 25.7 mmHg. Aortic valve Vmax measures 3.35 m/s, Indexed  AVA 0.35 cmsq/msq, SVI 26, DI 0.26.   6. The inferior vena cava is  normal in size with greater than 50%  respiratory variability, suggesting right atrial pressure of 3 mmHg.   Comparison(s): A prior study was performed on 11/04/2017. Compared to prior  study, Aortic stenosis have worsen. In 2019 there was mild AS with mean  gradient 14 mmHg peak gradient 26 mmHg.   Left LE Venous Reflux 10/09/2019: Summary:  Left:  - No evidence of deep vein thrombosis seen in the left lower extremity.  - Venous reflux is noted in the deep venous system.  - Venous reflux is noted in the great saphenous vein.  - There is a tortuous anterior saphenous vein branch observed with several  varicosities.  - There is a minimal amount of chronic thrombous observed in the proximal  small saphenous vein, no reflux noted.   EKG:  EKG is personally reviewed.   07/17/2021: NSR at 79 bpm  Recent Labs: 05/23/2021: ALT 35; BUN 19; Creatinine, Ser 0.85; Hemoglobin 11.8; Platelets 217.0; Potassium 4.8; Sodium 135; TSH 1.36   Recent Lipid Panel    Component Value Date/Time   CHOL 146 05/23/2021 1123   TRIG 179.0 (H) 05/23/2021 1123   HDL 49.30 05/23/2021 1123   CHOLHDL 3 05/23/2021 1123   VLDL 35.8 05/23/2021 1123   LDLCALC 61 05/23/2021 1123   LDLCALC 75 10/24/2017 1559   LDLDIRECT 104.0 04/08/2017 1125    Physical Exam:    VS:  BP (!) 168/72   Pulse 79   Ht 5' 5"  (1.651 m)   Wt 188 lb 8 oz (85.5 kg)   SpO2 96%   BMI 31.37 kg/m     Wt Readings from Last 3 Encounters:  07/17/21 188 lb 8 oz (85.5 kg)  05/23/21 185 lb (83.9 kg)  05/05/21 187 lb (84.8 kg)    GEN: Well nourished, well developed in no acute distress HEENT: Normal, moist mucous membranes NECK: No JVD CARDIAC: regular rhythm, normal S1 and S2, no rubs or gallops. 3/6 aortic valve mid-peaking murmur. VASCULAR: Radial and DP pulses 2+ bilaterally. No carotid bruits RESPIRATORY:  Clear to auscultation without rales, wheezing or rhonchi  ABDOMEN: Soft, non-tender, non-distended MUSCULOSKELETAL:  Ambulates  independently SKIN: Warm and dry, no edema NEUROLOGIC:  Alert and oriented x 3. No focal neuro deficits noted. PSYCHIATRIC:  Normal affect    ASSESSMENT:    1. Nonrheumatic aortic valve stenosis   2. Mixed hyperlipidemia   3. Essential hypertension   4. Type 2 diabetes mellitus with diabetic neuropathy, without long-term current use of insulin (  [place])   5. At increased risk for cardiovascular disease   6. Cardiac risk counseling   7. Counseling on health promotion and disease prevention    PLAN:    Aortic stenosis -we discussed this at length today, including education with models/images of the aortic valve -we discussed potential causes and long term history once aortic stenosis becomes symptomatic -discussed options for further evaluation vs. Monitoring -after shared decision making, she would like to recheck echo (6 mos from prior) and monitor for progression -counseled on red flag warning signs that need immediate medical attention  Hypertension: -continue amlodipine, losartan  Type II diabetes Mixed hyperlipidemia -on empagliflozin, glipizide, metformin, sitagliptin -elevated ASCVD risk, currently on simvastatin, last LDL 61. Last TG 179, if fasting this is above goal  Cardiac risk counseling and prevention recommendations: -recommend heart healthy/Mediterranean diet, with whole grains, fruits, vegetable, fish, lean meats, nuts, and olive oil. Limit salt. -recommend moderate walking, 3-5 times/week for 30-50 minutes each session. Aim for at least 150 minutes.week. Goal should be pace of 3 miles/hours, or walking 1.5 miles in 30 minutes -recommend avoidance of tobacco products. Avoid excess alcohol. -ASCVD risk score: The 10-year ASCVD risk score (Arnett DK, et al., 2019) is: 49.3%   Values used to calculate the score:     Age: 35 years     Sex: Female     Is Non-Hispanic African American: No     Diabetic: Yes     Tobacco smoker: No     Systolic Blood Pressure: 203  mmHg     Is BP treated: Yes     HDL Cholesterol: 49.3 mg/dL     Total Cholesterol: 146 mg/dL    Plan for follow up: 6 months or sooner as needed, following Echo.  Buford Dresser, MD, PhD, [facility]    Medication Adjustments/Labs and Tests Ordered: Current medicines are reviewed at length with the patient today.  Concerns regarding medicines are outlined above.   Orders Placed This Encounter  Procedures   EKG 12-Lead   ECHOCARDIOGRAM COMPLETE    No orders of the defined types were placed in this encounter.   Patient Instructions  .Medication Instructions:  Your Physician recommend you continue on your current medication as directed.    *If you need a refill on your cardiac medications before your next appointment, please call your pharmacy*   Lab Work: None ordered today   Testing/Procedures: Your physician has requested that you have an echocardiogram in 5 months. Echocardiography is a painless test that uses sound waves to create images of your heart. It provides your doctor with information about the size and shape of your heart and how well your heart's chambers and valves are working. This procedure takes approximately one hour. There are no restrictions for this procedure. [place], you and your health needs are our priority.  As part of our continuing mission to provide you with exceptional heart care, we have created designated Provider Care Teams.  These Care Teams include your primary Cardiologist (physician) and Advanced Practice Providers (APPs -  Physician Assistants and Nurse Practitioners) who all work together to provide you with the care you need, when you need it.  We recommend signing up for the patient portal called "MyChart".  Sign up information is provided on this After Visit Summary.  MyChart is used to connect with patients for Virtual Visits (Telemedicine).   Patients are able to view  lab/test results, encounter notes, upcoming appointments, etc.  Non-urgent messages can be sent to your provider as well.   To learn more about what you can do with MyChart, go to [email or web address].    Your next appointment:   5 month(s)  The format for your next appointment:   In Person  Provider:   Buford Dresser, MD        [facility] Stump[place],acting as a scribe for Buford Dresser, MD.,have documented all relevant documentation on the behalf of Buford Dresser, MD,as directed by  Buford Dresser, MD while in the presence of Buford Dresser, MD.  I, Buford Dresser, MD, have reviewed all documentation for this visit. The documentation on 08/29/21 for the exam, diagnosis, procedures, and orders are all accurate and complete.   Signed, Buford Dresser, MD PhD 07/17/2021   [place]

## 2021-07-17 NOTE — Patient Instructions (Signed)
.  Medication Instructions:  Your Physician recommend you continue on your current medication as directed.    *If you need a refill on your cardiac medications before your next appointment, please call your pharmacy*   Lab Work: None ordered today   Testing/Procedures: Your physician has requested that you have an echocardiogram in 5 months. Echocardiography is a painless test that uses sound waves to create images of your heart. It provides your doctor with information about the size and shape of your heart and how well your heart's chambers and valves are working. This procedure takes approximately one hour. There are no restrictions for this procedure. [place], you and your health needs are our priority.  As part of our continuing mission to provide you with exceptional heart care, we have created designated Provider Care Teams.  These Care Teams include your primary Cardiologist (physician) and Advanced Practice Providers (APPs -  Physician Assistants and Nurse Practitioners) who all work together to provide you with the care you need, when you need it.  We recommend signing up for the patient portal called "MyChart".  Sign up information is provided on this After Visit Summary.  MyChart is used to connect with patients for Virtual Visits (Telemedicine).  Patients are able to view lab/test results, encounter notes, upcoming appointments, etc.  Non-urgent messages can be sent to your provider as well.   To learn more about what you can do with MyChart, go to [email or web address].    Your next appointment:   5 month(s)  The format for your next appointment:   In Person  Provider:   Buford Dresser, MD

## 2021-07-25 ENCOUNTER — Ambulatory Visit
Admission: RE | Admit: 2021-07-25 | Discharge: 2021-07-25 | Disposition: A | Discharge status: Home / Self Care | Payer: PPO | Source: Ambulatory Visit | Attending: Family Medicine | Admitting: Family Medicine

## 2021-07-25 DIAGNOSIS — Z1231 Encounter for screening mammogram for malignant neoplasm of breast: Secondary | ICD-10-CM | POA: Diagnosis not present

## 2021-08-04 ENCOUNTER — Other Ambulatory Visit: Payer: Self-pay | Admitting: Family Medicine

## 2021-08-04 DIAGNOSIS — E1140 Type 2 diabetes mellitus with diabetic neuropathy, unspecified: Secondary | ICD-10-CM

## 2021-08-21 ENCOUNTER — Other Ambulatory Visit: Payer: Self-pay | Admitting: Family Medicine

## 2021-08-29 ENCOUNTER — Encounter (HOSPITAL_BASED_OUTPATIENT_CLINIC_OR_DEPARTMENT_OTHER): Payer: Self-pay | Admitting: Cardiology

## 2021-08-30 ENCOUNTER — Telehealth (HOSPITAL_BASED_OUTPATIENT_CLINIC_OR_DEPARTMENT_OTHER): Payer: Self-pay | Admitting: Cardiology

## 2021-08-30 NOTE — Telephone Encounter (Signed)
STAT if patient feels like he/she is going to faint   Are you dizzy now? yes  Do you feel faint or have you passed out? no  Do you have any other symptoms? Lightheadness   Have you checked your HR and BP (record if available)? no

## 2021-08-30 NOTE — Telephone Encounter (Signed)
Returned call to patient to evaluate telephone encounter!    Patient states Dr. Harrell Gave told her to call if anything ever changed. Patient states the last few days she just has felt lightheaded. Patient denies any actual passing out!   Patient had follow up appointment in May, able to get patient scheduled for appointment with Dr. Harrell Gave on 1/31 at 8am for sooner evaluation.    Patient is scheduled for echo in may and will need follow up appointment rescheduled for that pending being seen on 1/31   Will route to Dr. Harrell Gave to ensure no further action be taken at this time!

## 2021-08-31 NOTE — Telephone Encounter (Signed)
No further action needed at this time per Dr. Harrell Gave   "Agree, we can discuss at her upcoming appt."

## 2021-08-31 NOTE — Telephone Encounter (Signed)
Agree, we can discuss at her upcoming appt.

## 2021-09-07 ENCOUNTER — Other Ambulatory Visit: Payer: Self-pay | Admitting: Family Medicine

## 2021-09-07 DIAGNOSIS — I10 Essential (primary) hypertension: Secondary | ICD-10-CM

## 2021-09-08 ENCOUNTER — Other Ambulatory Visit: Payer: Self-pay | Admitting: Family Medicine

## 2021-09-12 ENCOUNTER — Ambulatory Visit (HOSPITAL_BASED_OUTPATIENT_CLINIC_OR_DEPARTMENT_OTHER): Payer: PPO | Admitting: Cardiology

## 2021-09-12 ENCOUNTER — Encounter (HOSPITAL_BASED_OUTPATIENT_CLINIC_OR_DEPARTMENT_OTHER): Payer: Self-pay | Admitting: Cardiology

## 2021-09-12 ENCOUNTER — Other Ambulatory Visit: Payer: Self-pay

## 2021-09-12 VITALS — BP 153/67 | HR 88 | Ht 65.0 in | Wt 189.6 lb

## 2021-09-12 DIAGNOSIS — I10 Essential (primary) hypertension: Secondary | ICD-10-CM

## 2021-09-12 DIAGNOSIS — E782 Mixed hyperlipidemia: Secondary | ICD-10-CM | POA: Diagnosis not present

## 2021-09-12 DIAGNOSIS — E1140 Type 2 diabetes mellitus with diabetic neuropathy, unspecified: Secondary | ICD-10-CM

## 2021-09-12 DIAGNOSIS — I350 Nonrheumatic aortic (valve) stenosis: Secondary | ICD-10-CM | POA: Diagnosis not present

## 2021-09-12 DIAGNOSIS — R42 Dizziness and giddiness: Secondary | ICD-10-CM | POA: Diagnosis not present

## 2021-09-12 NOTE — Progress Notes (Signed)
Cardiology Office Note:    Date:  09/12/2021   ID:  Karen Gonzales, DOB 1945/09/21, MRN [id number]  PCP:  [name], MD  Cardiologist:  Buford Dresser, MD  Referring MD: [name], MD   CC: follow up   History of Present Illness:    Karen Gonzales is a 76 y.o. female with a hx of hypertension, hyperlipidemia, diabetes, DVT, GERD, hyponatremia (with diuretic use), fatty liver disease (nonalcoholic), anti-cardiolipin antibody syndrome, arthritis, and COVID-19, who is seen for follow up today. I initially met her 07/17/21 as a new consult at the request of [name], MD for the evaluation and management of aortic valve stenosis.  Echo on 06/06/21: The aortic valve is calcified. Aortic valve regurgitation is not visualized. Moderate to severe aortic valve stenosis (Susipicion for Paradoxical Low flow low gradient aortic stenosis). Aortic valve area, by VTI measures 0.67 cm. Aortic valve mean gradient measures 25.7 mmHg. Aortic valve Vmax measures 3.35 m/s, Indexed AVA 0.35 cmsq/msq, SVI 26, DI 0.26.   Cardiovascular risk factors: Prior clinical ASCVD: None Comorbid conditions: Hypertension, hyperlipidemia, diabetes (dx in her 40'N) Metabolic syndrome/Obesity: Highest adult weight 213 lbs. Chronic inflammatory conditions: None Tobacco use history: Never Family history: "Whole family" has diabetes. Father died of heart failure at 15 yo. Mother died at 40 yo due to kidney failure. Two brothers, one died from diabetes, and one living with diabetes and other health issues. Prior cardiac testing and/or incidental findings on other testing (ie coronary calcium): Ultrasound 2019 Exercise level: Helps care for 4 great-grandchildren often. Previously walked her dog regularly. Has about 4 steps to their home. She will become a little winded with longer flights of stairs. Occasionally she needs to sit down and rest during activity, such as mopping the floor.    Today: Feels tired and lightheaded now, worse with more activity. No syncope. Feels like a cold chill in her chest. Mild shortness of breath. Better with drinking water. No strokelike symptoms. No typical strenuous activity, does do some outside work on occasion.   BP up in office today, doesn't check routinely at home but has access to equipement.  Sugars have been good.  Denies chest pain, shortness of breath at rest or with normal exertion. No PND, orthopnea, LE edema or unexpected weight gain. No syncope or palpitations.   Past Medical History:  Diagnosis Date   Anti-cardiolipin antibody syndrome ([place]) 06/25/2011   Arthritis    Blood transfusion    had when knee was replaced   Bronchitis    history of   Chills    Complication of anesthesia    Cough    COVID-19    Diabetes mellitus    DVT (deep venous thrombosis) ([place]) 06/25/2011   Fatty liver disease, nonalcoholic    determinde by Ultrasound 11/11   GERD (gastroesophageal reflux disease)    H/O vitamin D deficiency    Heart murmur    Heart murmur    Benign   Hiatal hernia    Hyperlipidemia    Hypertension    Hyponatremia    with diuretic use   Osteopenia    PONV (postoperative nausea and vomiting)    Vertigo     Past Surgical History:  Procedure Laterality Date   ABDOMINAL HYSTERECTOMY     ACHILLES TENDON SURGERY     bilateral    CARPAL TUNNEL RELEASE     bilateral    CHOLECYSTECTOMY  1969   open   CRANIOTOMY  07/09/2011  Procedure: CRANIOTOMY HEMATOMA EVACUATION SUBDURAL;  Surgeon: Charlie Pitter;  Location: [facility];  Service: Neurosurgery;  Laterality: Right;  Excision of Skull Lesion-Right Right Parietal Crani w/ Resection of Skull Tumor (1 1/2 hours - to follow)   [place] REPLACEMENT  2004   left knee replacement    KNEE SURGERY     total of 6 surgeries on both knees    REPAIR PERONEAL TENDONS ANKLE     TUBAL LIGATION     TUMOR REMOVAL     on pelvic bone      Current Medications: Current Outpatient Medications on File Prior to Visit  Medication Sig   amLODipine (NORVASC) 10 MG tablet TAKE 1 TABLET BY MOUTH EVERY DAY   Blood Glucose Monitoring Suppl (ONE TOUCH ULTRA 2) w/Device KIT Pt uses glucometer to check sugars twice daily. Dx E11.9   empagliflozin (JARDIANCE) 10 MG TABS tablet Take 1 tablet (10 mg total) by mouth daily.   gabapentin (NEURONTIN) 300 MG capsule TAKE 2 CAPSULES BY MOUTH 3 TIMES DAILY.   glipiZIDE (GLUCOTROL XL) 5 MG 24 hr tablet Take 1 tablet (5 mg total) by mouth daily with breakfast.   losartan (COZAAR) 50 MG tablet TAKE 2 TABLETS BY MOUTH EVERY DAY   meloxicam (MOBIC) 15 MG tablet    metFORMIN (GLUCOPHAGE) 500 MG tablet TAKE 2 TABLETS (1,000 MG TOTAL) BY MOUTH 2 (TWO) TIMES DAILY WITH A MEAL.   omeprazole (PRILOSEC) 40 MG capsule Take 1 capsule (40 mg total) by mouth 2 (two) times daily. (Patient taking differently: Take 40 mg by mouth daily.)   OneTouch Delica Lancets 14N MISC USE WHEN CHECKING BLOOD SUGAR AS DIRECTED   ONETOUCH ULTRA test strip CHECK BLOOD SUGAR 1-2 TIMES DAILY   simvastatin (ZOCOR) 10 MG tablet TAKE 1 TABLET BY MOUTH EVERYDAY AT BEDTIME   sitaGLIPtin (JANUVIA) 100 MG tablet Take 100 mg by mouth daily.   MELATONIN PO Take by mouth. (Patient not taking: Reported on 09/12/2021)   No current facility-administered medications on file prior to visit.     Allergies:   Nsaids, Reglan [metoclopramide], Estrogens, and Other   Social History   Tobacco Use   Smoking status: Never   Smokeless tobacco: Never  Vaping Use   Vaping Use: Never used  Substance Use Topics   Alcohol use: No   Drug use: No    Family History: family history includes Breast cancer in her maternal grandmother and paternal aunt; Cancer in her father, maternal grandmother, and paternal grandmother; Healthy in her brother; Heart disease in her father; Hodgkin's lymphoma in her grandchild; Kidney disease in her mother; Osteoporosis in  her father and mother.  ROS:   Please see the history of present illness.  Additional pertinent ROS otherwise unremarkable.  EKGs/Labs/Other Studies Reviewed:    The following studies were reviewed today:  Echo 06/06/2021:  1. Left ventricular ejection fraction, by estimation, is 55 to 60%. The  left ventricle has normal function. The left ventricle has no regional  wall motion abnormalities. There is mild asymmetric left ventricular  hypertrophy of the posterior segment.  Left ventricular diastolic parameters are consistent with Grade I  diastolic dysfunction (impaired relaxation).   2. Right ventricular systolic function is normal. The right ventricular  size is normal.   3. Left atrial size was moderately dilated.   4. The mitral valve is normal in structure. No evidence of mitral valve  regurgitation. No evidence of mitral  stenosis.   5. The aortic valve is calcified. Aortic valve regurgitation is not  visualized. Moderate to severe aortic valve stenosis (Susipicion for  Paradoxical Low flow low gradient aortic stenosis). Aortic valve area, by  VTI measures 0.67 cm. Aortic valve mean  gradient measures 25.7 mmHg. Aortic valve Vmax measures 3.35 m/s, Indexed  AVA 0.35 cmsq/msq, SVI 26, DI 0.26.   6. The inferior vena cava is normal in size with greater than 50%  respiratory variability, suggesting right atrial pressure of 3 mmHg.   Comparison(s): A prior study was performed on 11/04/2017. Compared to prior  study, Aortic stenosis have worsen. In 2019 there was mild AS with mean  gradient 14 mmHg peak gradient 26 mmHg.   Left LE Venous Reflux 10/09/2019: Summary:  Left:  - No evidence of deep vein thrombosis seen in the left lower extremity.  - Venous reflux is noted in the deep venous system.  - Venous reflux is noted in the great saphenous vein.  - There is a tortuous anterior saphenous vein branch observed with several  varicosities.  - There is a minimal amount of  chronic thrombous observed in the proximal  small saphenous vein, no reflux noted.   EKG:  EKG is personally reviewed.   09/12/21 NSR at 88 bpm 07/17/2021: NSR at 79 bpm  Recent Labs: 05/23/2021: ALT 35; BUN 19; Creatinine, Ser 0.85; Hemoglobin 11.8; Platelets 217.0; Potassium 4.8; Sodium 135; TSH 1.36   Recent Lipid Panel    Component Value Date/Time   CHOL 146 05/23/2021 1123   TRIG 179.0 (H) 05/23/2021 1123   HDL 49.30 05/23/2021 1123   CHOLHDL 3 05/23/2021 1123   VLDL 35.8 05/23/2021 1123   LDLCALC 61 05/23/2021 1123   LDLCALC 75 10/24/2017 1559   LDLDIRECT 104.0 04/08/2017 1125    Physical Exam:    VS:  BP (!) 153/67    Pulse 88    Ht _0  (1.651 m)    Wt 189 lb 9.6 oz (86 kg)    SpO2 97%    BMI 31.55 kg/m     Orthostatic VS for the past 24 hrs (Last 3 readings):  BP- Lying Pulse- Lying BP- Sitting Pulse- Sitting BP- Standing at 0 minutes Pulse- Standing at 0 minutes BP- Standing at 3 minutes Pulse- Standing at 3 minutes  09/12/21 0820 165/72 89 159/73 87 154/67 94 153/67 91     Wt Readings from Last 3 Encounters:  09/12/21 189 lb 9.6 oz (86 kg)  07/17/21 188 lb 8 oz (85.5 kg)  05/23/21 185 lb (83.9 kg)    GEN: Well nourished, well developed in no acute distress HEENT: Normal, moist mucous membranes NECK: No JVD CARDIAC: regular rhythm, normal S1 and S2, no rubs or gallops. 3/6 early to mid peaking systolic ejection murmur with preserved S2. VASCULAR: Radial and DP pulses 2+ bilaterally. No carotid bruits RESPIRATORY:  Clear to auscultation without rales, wheezing or rhonchi  ABDOMEN: Soft, non-tender, non-distended MUSCULOSKELETAL:  Ambulates independently SKIN: Warm and dry, no edema NEUROLOGIC:  Alert and oriented x 3. No focal neuro deficits noted. PSYCHIATRIC:  Normal affect    ASSESSMENT:    1. Lightheadedness   2. Nonrheumatic aortic valve stenosis   3. Essential hypertension   4. Mixed hyperlipidemia   5. Type 2 diabetes mellitus with diabetic  neuropathy, without long-term current use of insulin (HCC)     PLAN:    Lightheadedness -BP elevated today -orthostatics negative -no sensation of vertigo -no syncope -associated with  cold chill sensation in chest -given exam, symptoms, unlikely this is cardiac  Aortic stenosis -we discussed at length today what symptoms would be highly concerning for AS -scheduled 12/2021 to recheck echo (6 mos from prior) and monitor for progression -counseled on red flag warning signs that need immediate medical attention  Hypertension: -elevated today, but reports lightheadedness -discussed home monitoring today, will contact me if consistently <518/84 or systolic <166 -continue amlodipine, losartan  Type II diabetes Mixed hyperlipidemia -on empagliflozin, glipizide, metformin, sitagliptin -elevated ASCVD risk, currently on simvastatin, last LDL 61. Last TG 179, if fasting this is above goal  Cardiac risk counseling and prevention recommendations: -recommend heart healthy/Mediterranean diet, with whole grains, fruits, vegetable, fish, lean meats, nuts, and olive oil. Limit salt. -recommend moderate walking, 3-5 times/week for 30-50 minutes each session. Aim for at least 150 minutes.week. Goal should be pace of 3 miles/hours, or walking 1.5 miles in 30 minutes -recommend avoidance of tobacco products. Avoid excess alcohol. -ASCVD risk score: The 10-year ASCVD risk score (Arnett DK, et al., 2019) is: 43%   Values used to calculate the score:     Age: 59 years     Sex: Female     Is Non-Hispanic African American: No     Diabetic: Yes     Tobacco smoker: No     Systolic Blood Pressure: 063 mmHg     Is BP treated: Yes     HDL Cholesterol: 49.3 mg/dL     Total Cholesterol: 146 mg/dL    Plan for follow up: 5 months or sooner as needed, following Echo.  Buford Dresser, MD, PhD, [facility]    Medication Adjustments/Labs and Tests Ordered: Current medicines  are reviewed at length with the patient today.  Concerns regarding medicines are outlined above.   Orders Placed This Encounter  Procedures   EKG 12-Lead    No orders of the defined types were placed in this encounter.   Patient Instructions  Medication Instructions:  Your Physician recommend you continue on your current medication as directed.    *If you need a refill on your cardiac medications before your next appointment, please call your pharmacy*   Lab Work: None ordered today   Testing/Procedures: None ordered today   Follow-Up: At [facility], you and your health needs are our priority.  As part of our continuing mission to provide you with exceptional heart care, we have created designated Provider Care Teams.  These Care Teams include your primary Cardiologist (physician) and Advanced Practice Providers (APPs -  Physician Assistants and Nurse Practitioners) who all work together to provide you with the care you need, when you need it.  We recommend signing up for the patient portal called "MyChart".  Sign up information is provided on this After Visit Summary.  MyChart is used to connect with patients for Virtual Visits (Telemedicine).  Patients are able to view lab/test results, encounter notes, upcoming appointments, etc.  Non-urgent messages can be sent to your provider as well.   To learn more about what you can do with MyChart, go to [email or web address].    Your next appointment:   After ECHO in May  The format for your next appointment:   In Person  Provider:   Buford Dresser, MD       Signed, Buford Dresser, MD PhD 09/12/2021   [place]

## 2021-09-12 NOTE — Patient Instructions (Signed)
Medication Instructions:  Your Physician recommend you continue on your current medication as directed.    *If you need a refill on your cardiac medications before your next appointment, please call your pharmacy*   Lab Work: None ordered today   Testing/Procedures: None ordered today   Follow-Up: At [facility], you and your health needs are our priority.  As part of our continuing mission to provide you with exceptional heart care, we have created designated Provider Care Teams.  These Care Teams include your primary Cardiologist (physician) and Advanced Practice Providers (APPs -  Physician Assistants and Nurse Practitioners) who all work together to provide you with the care you need, when you need it.  We recommend signing up for the patient portal called "MyChart".  Sign up information is provided on this After Visit Summary.  MyChart is used to connect with patients for Virtual Visits (Telemedicine).  Patients are able to view lab/test results, encounter notes, upcoming appointments, etc.  Non-urgent messages can be sent to your provider as well.   To learn more about what you can do with MyChart, go to [email or web address].    Your next appointment:   After ECHO in May  The format for your next appointment:   In Person  Provider:   Buford Dresser, MD

## 2021-09-25 ENCOUNTER — Encounter: Payer: Self-pay | Admitting: Family Medicine

## 2021-09-25 ENCOUNTER — Ambulatory Visit (INDEPENDENT_AMBULATORY_CARE_PROVIDER_SITE_OTHER): Payer: PPO | Admitting: Family Medicine

## 2021-09-25 VITALS — BP 128/70 | HR 84 | Temp 98.3°F | Resp 16 | Ht 65.0 in | Wt 185.6 lb

## 2021-09-25 DIAGNOSIS — E559 Vitamin D deficiency, unspecified: Secondary | ICD-10-CM

## 2021-09-25 DIAGNOSIS — E1140 Type 2 diabetes mellitus with diabetic neuropathy, unspecified: Secondary | ICD-10-CM | POA: Diagnosis not present

## 2021-09-25 DIAGNOSIS — Z0000 Encounter for general adult medical examination without abnormal findings: Secondary | ICD-10-CM | POA: Diagnosis not present

## 2021-09-25 LAB — CBC WITH DIFFERENTIAL/PLATELET
Basophils Absolute: 0 10*3/uL (ref 0.0–0.1)
Basophils Relative: 0.6 % (ref 0.0–3.0)
Eosinophils Absolute: 0.2 10*3/uL (ref 0.0–0.7)
Eosinophils Relative: 2.8 % (ref 0.0–5.0)
HCT: 35.4 % — ABNORMAL LOW (ref 36.0–46.0)
Hemoglobin: 11.4 g/dL — ABNORMAL LOW (ref 12.0–15.0)
Lymphocytes Relative: 26.3 % (ref 12.0–46.0)
Lymphs Abs: 2.2 10*3/uL (ref 0.7–4.0)
MCHC: 32.1 g/dL (ref 30.0–36.0)
MCV: 77.9 fl — ABNORMAL LOW (ref 78.0–100.0)
Monocytes Absolute: 0.7 10*3/uL (ref 0.1–1.0)
Monocytes Relative: 8.7 % (ref 3.0–12.0)
Neutro Abs: 5.1 10*3/uL (ref 1.4–7.7)
Neutrophils Relative %: 61.6 % (ref 43.0–77.0)
Platelets: 233 10*3/uL (ref 150.0–400.0)
RBC: 4.54 Mil/uL (ref 3.87–5.11)
RDW: 16 % — ABNORMAL HIGH (ref 11.5–15.5)
WBC: 8.2 10*3/uL (ref 4.0–10.5)

## 2021-09-25 LAB — HEMOGLOBIN A1C: Hgb A1c MFr Bld: 7.3 % — ABNORMAL HIGH (ref 4.6–6.5)

## 2021-09-25 LAB — TSH: TSH: 1.47 u[IU]/mL (ref 0.35–5.50)

## 2021-09-25 LAB — VITAMIN D 25 HYDROXY (VIT D DEFICIENCY, FRACTURES): VITD: 15.85 ng/mL — ABNORMAL LOW (ref 30.00–100.00)

## 2021-09-25 NOTE — Progress Notes (Signed)
Subjective:    Patient ID: Karen Gonzales, female    DOB: Aug 06, 1946, 76 y.o.   MRN: [id number]  HPI CPE- UTD on mammo, colonoscopy, eye exam, foot exam.  UTD on flu, PNA.  Patient Care Team    Relationship Specialty Notifications Start End  [name], MD PCP - General Family Medicine  11/07/15   Buford Dresser, MD PCP - Cardiology Cardiology  07/17/21   Earnie Larsson, MD Consulting Physician Neurosurgery  07/02/11   Laurence Spates, MD (Inactive) Consulting Physician Gastroenterology  11/07/15   Wallene Huh, DPM Consulting Physician Podiatry  11/07/15   Madelin Rear, [facility] Pharmacist Pharmacist Admissions 11/23/19    Comment: PHONE NUMBER [phone]    Health Maintenance  Topic Date Due   COVID-19 Vaccine (1) Never done   Zoster Vaccines- Shingrix (1 of 2) Never done   DEXA SCAN  11/19/2019   TETANUS/TDAP  05/23/2022 (Originally 03/12/2021)   HEMOGLOBIN A1C  11/21/2021   OPHTHALMOLOGY EXAM  12/01/2021   FOOT EXAM  05/23/2022   MAMMOGRAM  07/25/2022   COLONOSCOPY (Pts 45-54yr Insurance coverage will need to be confirmed)  08/23/2029   Pneumonia Vaccine 75 Years old  Completed   INFLUENZA VACCINE  Completed   Hepatitis C Screening  Completed   HPV VACCINES  Aged Out      Review of Systems Patient reports no vision/ hearing changes, adenopathy,fever, weight change,  persistant/recurrent hoarseness , swallowing issues, chest pain, palpitations, edema, persistant/recurrent cough, hemoptysis, gastrointestinal bleeding (melena, rectal bleeding), abdominal pain, significant heartburn, bowel changes, GU symptoms (dysuria, hematuria, incontinence), Gyn symptoms (abnormal  bleeding, pain),  syncope, focal weakness, memory loss, numbness & tingling, skin/hair/nail changes, abnormal bruising or bleeding, anxiety, or depression.   + SOB w/ exertion- following w/ Cards due to AS  This visit occurred during the SARS-CoV-2 public health emergency.  Safety protocols were in  place, including screening questions prior to the visit, additional usage of staff PPE, and extensive cleaning of exam room while observing appropriate contact time as indicated for disinfecting solutions.      Objective:   Physical Exam General Appearance:    Alert, cooperative, no distress, appears stated age  Head:    Normocephalic, without obvious abnormality, atraumatic  Eyes:    PERRL, conjunctiva/corneas clear, EOM's intact, fundi    benign, both eyes  Ears:    Normal TM's and external ear canals, both ears  Nose:   Deferred due to COVID  Throat:   Neck:   Supple, symmetrical, trachea midline, no adenopathy;    Thyroid: no enlargement/tenderness/nodules  Back:     Symmetric, no curvature, ROM normal, no CVA tenderness  Lungs:     Clear to auscultation bilaterally, respirations unlabored  Chest Wall:    No tenderness or deformity   Heart:    Regular rate and rhythm, S1 and S2 normal, III/VI SEM  Breast Exam:    Deferred to mammo  Abdomen:     Soft, non-tender, bowel sounds active all four quadrants,    no masses, no organomegaly  Genitalia:    Deferred  Rectal:    Extremities:   Extremities normal, atraumatic, no cyanosis or edema  Pulses:   2+ and symmetric all extremities  Skin:   Skin color, texture, turgor normal, no rashes or lesions  Lymph nodes:   Cervical, supraclavicular, and axillary nodes normal  Neurologic:   CNII-XII intact, normal strength, sensation and reflexes    throughout  Assessment & Plan:

## 2021-09-25 NOTE — Assessment & Plan Note (Signed)
Check labs and replete prn. 

## 2021-09-25 NOTE — Assessment & Plan Note (Signed)
Pt's PE WNL w/ exception of BMI.  UTD on mammo, colonoscopy, eye exam, foot exam.  UTD on flu, PNA vaccines.  Check labs.  Anticipatory guidance provided.

## 2021-09-25 NOTE — Patient Instructions (Addendum)
Follow up in 3-4 months to recheck diabetes We'll notify you of your lab results and make any changes if needed Keep up the good work on healthy diet!  You look great!! Call with any questions or concerns Stay safe!  Stay healthy! Happy Valentine's Day!!

## 2021-09-25 NOTE — Assessment & Plan Note (Signed)
Chronic problem.  Pt reports home sugars have been well controlled.  UTD on eye exam, foot exam, flu, and PNA.  Check labs.  Adjust meds prn

## 2021-09-26 LAB — BASIC METABOLIC PANEL
BUN: 14 mg/dL (ref 6–23)
CO2: 25 mEq/L (ref 19–32)
Calcium: 9.7 mg/dL (ref 8.4–10.5)
Chloride: 102 mEq/L (ref 96–112)
Creatinine, Ser: 0.85 mg/dL (ref 0.40–1.20)
GFR: 67.54 mL/min (ref >60.00)
Glucose, Bld: 112 mg/dL — ABNORMAL HIGH (ref 70–99)
Potassium: 4.2 mEq/L (ref 3.5–5.1)
Sodium: 137 mEq/L (ref 135–145)

## 2021-09-26 LAB — LIPID PANEL
Cholesterol: 167 mg/dL (ref 0–200)
HDL: 53.2 mg/dL (ref >39.00)
LDL Cholesterol: 80 mg/dL (ref 0–99)
NonHDL: 113.98
Total CHOL/HDL Ratio: 3
Triglycerides: 172 mg/dL — ABNORMAL HIGH (ref 0.0–149.0)
VLDL: 34.4 mg/dL (ref 0.0–40.0)

## 2021-09-26 LAB — HEPATIC FUNCTION PANEL
ALT: 26 U/L (ref 0–35)
AST: 24 U/L (ref 0–37)
Albumin: 4.6 g/dL (ref 3.5–5.2)
Alkaline Phosphatase: 57 U/L (ref 39–117)
Bilirubin, Direct: 0.1 mg/dL (ref 0.0–0.3)
Total Bilirubin: 0.4 mg/dL (ref 0.2–1.2)
Total Protein: 7.9 g/dL (ref 6.0–8.3)

## 2021-09-27 ENCOUNTER — Telehealth: Payer: Self-pay

## 2021-09-27 MED ORDER — VITAMIN D (ERGOCALCIFEROL) 1.25 MG (50000 UNIT) PO CAPS: 50000.0000 [IU] | ORAL_CAPSULE | ORAL | 0 refills | Status: DC

## 2021-09-27 NOTE — Telephone Encounter (Signed)
Pt is aware of labs and medication

## 2021-09-27 NOTE — Telephone Encounter (Signed)
-----   Message from [name], MD sent at 09/27/2021  7:25 AM EST ----- Labs look great w/ exception of low Vit D.  Based on this, we need to start prescription 50,000 units weekly x12 weeks in addition to daily OTC supplement of at least 2000 units.

## 2021-10-11 ENCOUNTER — Other Ambulatory Visit: Payer: Self-pay | Admitting: Family Medicine

## 2021-10-23 NOTE — Progress Notes (Unsigned)
Chronic Care Management Pharmacy Note  10/23/2021 Name:  Karen Gonzales MRN:  [id number] DOB:  November 01, 1945  Summary: -Will send Jardiance 25 mg for 2023 patient assistance renewal -Patient to bring in patient assistance application for Januvia 100 mg renewal  Subjective: Karen Gonzales is an 76 y.o. year old female who is a primary patient of Tabori, Aundra Millet, MD.  The CCM team was consulted for assistance with disease management and care coordination needs.    Engaged with patient by telephone for follow up visit in response to provider referral for pharmacy case management and/or care coordination services.   Consent to Services:  The patient was given information about Chronic Care Management services, agreed to services, and gave verbal consent prior to initiation of services.  Please see initial visit note for detailed documentation.   Patient Care Team: [name], MD as PCP - General (Family Medicine) Buford Dresser, MD as PCP - Cardiology (Cardiology) Earnie Larsson, MD as Consulting Physician (Neurosurgery) Laurence Spates, MD (Inactive) as Consulting Physician (Gastroenterology) Wallene Huh, DPM as Consulting Physician (Podiatry) Madelin Rear, [facility] as Pharmacist (Pharmacist)  Objective:  Lab Results  Component Value Date   CREATININE 0.85 09/25/2021   CREATININE 0.85 05/23/2021   CREATININE 0.79 10/21/2020    Lab Results  Component Value Date   HGBA1C 7.3 (H) 09/25/2021   Last diabetic Eye exam:  Lab Results  Component Value Date/Time   HMDIABEYEEXA No Retinopathy 10/30/2019 12:00 AM    Last diabetic Foot exam: No results found for: HMDIABFOOTEX      Component Value Date/Time   CHOL 167 09/25/2021 1038   TRIG 172.0 (H) 09/25/2021 1038   HDL 53.20 09/25/2021 1038   CHOLHDL 3 09/25/2021 1038   VLDL 34.4 09/25/2021 1038   LDLCALC 80 09/25/2021 1038   LDLCALC 75 10/24/2017 1559   LDLDIRECT 104.0 04/08/2017 1125    Hepatic  Function Latest Ref Rng & Units 09/25/2021 05/23/2021 06/02/2020  Total Protein 6.0 - 8.3 g/dL 7.9 8.0 7.5  Albumin 3.5 - 5.2 g/dL 4.6 4.6 4.5  AST 0 - 37 U/L 24 30 29   ALT 0 - 35 U/L 26 35 40(H)  Alk Phosphatase 39 - 117 U/L 57 53 51  Total Bilirubin 0.2 - 1.2 mg/dL 0.4 0.5 0.6  Bilirubin, Direct 0.0 - 0.3 mg/dL 0.1 0.1 0.1    Lab Results  Component Value Date/Time   TSH 1.47 09/25/2021 10:38 AM   TSH 1.36 05/23/2021 11:23 AM    CBC Latest Ref Rng & Units 09/25/2021 05/23/2021 06/02/2020  WBC 4.0 - 10.5 K/uL 8.2 7.6 7.5  Hemoglobin 12.0 - 15.0 g/dL 11.4(L) 11.8(L) 12.5  Hematocrit 36.0 - 46.0 % 35.4(L) 37.2 37.9  Platelets 150.0 - 400.0 K/uL 233.0 217.0 215.0    Lab Results  Component Value Date/Time   VD25OH 15.85 (L) 09/25/2021 10:38 AM    Clinical ASCVD:  The 10-year ASCVD risk score (Arnett DK, et al., 2019) is: 32.9%   Values used to calculate the score:     Age: 57 years     Sex: Female     Is Non-Hispanic African American: No     Diabetic: Yes     Tobacco smoker: No     Systolic Blood Pressure: 196 mmHg     Is BP treated: Yes     HDL Cholesterol: 53.2 mg/dL     Total Cholesterol: 167 mg/dL    DEXA-upcoming 2023 Mammogram-scheduled 2022  Echo 05/2021 - moderate to  severe aortic stenosis, referred to cardiology    Social History   Tobacco Use  Smoking Status Never  Smokeless Tobacco Never   BP Readings from Last 3 Encounters:  09/25/21 128/70  09/12/21 (!) 153/67  07/17/21 (!) 168/72   Pulse Readings from Last 3 Encounters:  09/25/21 84  09/12/21 88  07/17/21 79   Wt Readings from Last 3 Encounters:  09/25/21 185 lb 9.6 oz (84.2 kg)  09/12/21 189 lb 9.6 oz (86 kg)  07/17/21 188 lb 8 oz (85.5 kg)    Assessment: Review of patient past medical history, allergies, medications, health status, including review of consultants reports, laboratory and other test data, was performed as part of comprehensive evaluation and provision of chronic care  management services.   SDOH:  (Social Determinants of Health) assessments and interventions performed: Yes   CCM Care Plan  Allergies  Allergen Reactions   Nsaids Other (See Comments)   Reglan [Metoclopramide] Other (See Comments)    Tremor    Estrogens Rash    Topical patch caused the rash   Other Rash    Medications Reviewed Today     Reviewed by [name], MD (Physician) on 09/25/21 at [place] List Status: <None>   Medication Order Taking? Sig Documenting Provider Last Dose Status Informant  amLODipine (NORVASC) 10 MG tablet [id number] Yes TAKE 1 TABLET BY MOUTH EVERY DAY [name], MD Taking Active   Blood Glucose Monitoring Suppl (ONE TOUCH ULTRA 2) w/Device KIT [id number] Yes Pt uses glucometer to check sugars twice daily. Dx E11.9 [name], MD Taking Active   empagliflozin (JARDIANCE) 10 MG TABS tablet [id number] Yes Take 1 tablet (10 mg total) by mouth daily. [name], MD Taking Active            Med Note Madelin Rear   Wed Jun 21, 2021  9:25 AM) Patient assistance will be processed for Jardiance 25 mg for 2023 renewal  gabapentin (NEURONTIN) 300 MG capsule [id number] Yes TAKE 2 CAPSULES BY MOUTH 3 TIMES DAILY. [name], MD Taking Active   glipiZIDE (GLUCOTROL XL) 5 MG 24 hr tablet [id number] Yes Take 1 tablet (5 mg total) by mouth daily with breakfast. [name], MD Taking Active   losartan (COZAAR) 50 MG tablet [id number] Yes TAKE 2 TABLETS BY MOUTH EVERY DAY [name], MD Taking Active   MELATONIN PO [id number] No Take by mouth.  Patient not taking: Reported on 09/12/2021   [provider] Not Taking Active   meloxicam (MOBIC) 15 MG tablet [id number] Yes  [provider] Taking Active   metFORMIN (GLUCOPHAGE) 500 MG tablet [id number] Yes TAKE 2 TABLETS (1,000 MG TOTAL) BY MOUTH 2 (TWO) TIMES DAILY WITH A MEAL. [name], MD Taking Active   omeprazole (PRILOSEC) 40 MG capsule  [id number] Yes Take 1 capsule (40 mg total) by mouth 2 (two) times daily.  Patient taking differently: Take 40 mg by mouth daily.   [name], MD Taking Active   OneTouch Delica Lancets 97W MISC [id number] Yes USE WHEN CHECKING BLOOD SUGAR AS DIRECTED [name], MD Taking Active   [facility] ULTRA test strip [id number] Yes CHECK BLOOD SUGAR 1-2 TIMES DAILY [name], MD Taking Active   simvastatin (ZOCOR) 10 MG tablet [id number] Yes TAKE 1 TABLET BY MOUTH EVERYDAY AT BEDTIME [name], MD Taking Active   sitaGLIPtin (JANUVIA) 100 MG tablet [id number] Yes Take 100 mg by mouth daily. [provider] Taking Active             Patient Active Problem List   Diagnosis Date Noted   Allergic rhinitis 10/21/2020   Dysphagia 10/21/2020   Fatty liver 10/21/2020   Gastro-esophageal reflux disease without esophagitis 10/21/2020   Iron deficiency anemia 10/21/2020   Osteopenia 10/21/2020   Personal history of colonic polyps 10/21/2020   Pure hypercholesterolemia 10/21/2020   Vitamin D deficiency 10/21/2020   Dental disorder 03/02/2020   Chronic venous insufficiency 10/09/2019   Varicose veins of left lower extremity with pain 08/31/2019   Obesity (BMI 30-39.9) 08/31/2019   Heart murmur 10/24/2017   Physical exam 10/10/2016   Gastroparesis due to DM ([place]) 06/05/2016   Right shoulder pain 02/03/2016   Type 2 diabetes mellitus with diabetic neuropathy, without long-term current use of insulin ([place]) 11/07/2015   Hyperlipidemia associated with type 2 diabetes mellitus ([place]) 11/07/2015   Skull mass 07/02/2011   Fibromyalgia 06/25/2011   Essential hypertension 09/24/2007    Immunization History  Administered Date(s) Administered   Fluad Quad(high Dose 65+) 05/28/2019, 06/02/2020, 05/23/2021   Influenza Split 05/30/2009, 05/09/2010, 05/29/2013   Influenza, High Dose Seasonal PF 07/16/2014, 06/14/2015, 04/28/2018   Influenza,inj,Quad PF,6+ Mos  05/09/2011, 06/14/2015, 05/14/2016, 04/08/2017   Pneumococcal Conjugate-13 10/23/2013   Pneumococcal Polysaccharide-23 11/04/2014   Tdap 02/27/2010, 03/13/2011    Conditions to be addressed/monitored: GERD, HLD, HTN, DM, Osteopenia  There are no care plans that you recently modified to display for this patient.   Patient's preferred pharmacy is:  [id number] [place], [place] - [address] [place] [address]Phone: [phone]: [phone]  Pt endorses 100% compliance  Follow Up:  Patient agrees to Care Plan and Follow-up.  Plan:  1 month DM/pt call to check onreadings and completion of PAP re-enrollment app (Januvia). 3-4 month CPP telephone f/u.  Future Appointments  Date Time Provider [place] 10/25/2021  8:30 AM [facility] CCM PHARMACIST [facility] PEC  11/21/2021  9:30 AM GI-BCG DX DEXA 1 GI-BCGDG GI-BREAST CE  12/15/2021  1:00 PM DW[facility]-ECHO/VAS [facility]-CVIMG DWB  12/25/2021  9:20 AM CBuford Dresser MD [facility]B-CVD DWB  01/18/2022 10:30 AM TMidge Minium MD [facility] [place] PharmD Clinical Pharmacist  [place]([phone]   Current Barriers:  Unable to maintain control of diabetes  Pharmacist Clinical Goal(s):  Patient will verbalize ability to afford treatment regimen achieve adherence to monitoring guidelines and medication adherence to achieve therapeutic efficacy contact provider office for questions/concerns as evidenced notation of same in electronic health record through collaboration with PharmD and provider.   Interventions: 1:1 collaboration with TMidge Minium MD regarding development and update of comprehensive plan of care as evidenced by provider attestation and co-signature Inter-disciplinary care team collaboration (see longitudinal plan of care) Comprehensive medication review performed; medication list updated in electronic  medical record  Diabetes (A1c goal <7%) -Not ideally controlled - near goal with a1c 7.1% 05/2021 -Current medications: Metformin 1000 mg IR twice daily with meals Jardiance 10 mg once daily (patient assistance)  Januvia 100 mg once daily (patient assistance) Glipizide 5 mg XL once daily with breakfast  -Current home glucose readings fasting glucose:  October 2023: 120-130 FBG August 2022: 123, 125, 140, 161, 123 May 2022: 118-180  -Denies hypoglycemic/hyperglycemic symptoms -Current exercise: no formal exercise - limited due to SOB, stays active with grandchildren -Educated on A1c and  blood sugar goals; Benefits of routine self-monitoring of blood sugar; -Recommended to continue current medication Pt has received Januvia PAP paper from [employer], will drop off at office for 2023 renewal Jardiance PAP received, will send increased dose for Jardiance 25 mg once daily, prepared for PCP signing  Hypertension (BP goal <140/90) -Controlled -Current treatment: Amlodipine 10 mg once daily  Losartan 50 mg once daily   -Current home readings: none provided -Current dietary habits: no changes. High carb, high salt  -Current exercise habits: limited  -Denies hypotensive/hypertensive symptoms -Educated on BP goals and benefits of medications for prevention of heart attack, stroke and kidney damage; Symptoms of hypotension and importance of maintaining adequate hydration; -Counseled to monitor BP at home 1-2x/wk, document, and provide log at future appointments -Recommended to continue current medication  Patient Goals/Self-Care Activities Patient will:  - take medications as prescribed Minimize meloxicam use, consider checking BG 2 hours after dinner 2-3x/wk to help assess blood sugar control throughout the day  Medication Assistance: Lind Guest and Januvia through patient assistance

## 2021-10-25 ENCOUNTER — Telehealth: Payer: PPO

## 2021-10-26 ENCOUNTER — Other Ambulatory Visit: Payer: Self-pay | Admitting: Family Medicine

## 2021-10-26 DIAGNOSIS — E1140 Type 2 diabetes mellitus with diabetic neuropathy, unspecified: Secondary | ICD-10-CM

## 2021-10-29 ENCOUNTER — Other Ambulatory Visit: Payer: Self-pay | Admitting: Family Medicine

## 2021-10-31 ENCOUNTER — Telehealth: Payer: Self-pay

## 2021-10-31 NOTE — Telephone Encounter (Signed)
Says she dropped this off at front desk but I do not believe I ever got it.  Has anyone else seen this paperwork for her Januvia application.  She says she has been approved for Jardiance but not yet for the Januvia.

## 2021-10-31 NOTE — Telephone Encounter (Signed)
Patient called in stating that she dropped off paperwork over a month ago for sitaGLIPtin (JANUVIA) 100 MG tablet. [place] states she contacted the company and they have yet to receive this paperwork, and patient has been out for awhile. Asked if Gerald Stabs could give her a call back on her cell phone number.    ?

## 2021-11-01 NOTE — Telephone Encounter (Signed)
I have printed and filled out another form for her.  Where would you like me to put it for Dr. Rande Lawman signature?

## 2021-11-01 NOTE — Telephone Encounter (Signed)
Great thanks placed in her box!

## 2021-11-02 NOTE — Telephone Encounter (Signed)
I have not filled anything out on her behalf.  I can check a small stack of papers I have not gone through from a few days ago, but all recent paperwork has been completed ?

## 2021-11-03 NOTE — Telephone Encounter (Signed)
Form completed and placed in basket  

## 2021-11-03 NOTE — Telephone Encounter (Signed)
Form retrieved and placed on pharmacists desk ?

## 2021-11-03 NOTE — Telephone Encounter (Signed)
Thanks!! Will take care of this next week

## 2021-11-21 ENCOUNTER — Ambulatory Visit
Admission: RE | Admit: 2021-11-21 | Discharge: 2021-11-21 | Disposition: A | Discharge status: Home / Self Care | Payer: PPO | Source: Ambulatory Visit | Attending: Family Medicine | Admitting: Family Medicine

## 2021-11-21 DIAGNOSIS — M85851 Other specified disorders of bone density and structure, right thigh: Secondary | ICD-10-CM | POA: Diagnosis not present

## 2021-11-21 DIAGNOSIS — Z780 Asymptomatic menopausal state: Secondary | ICD-10-CM | POA: Diagnosis not present

## 2021-11-23 ENCOUNTER — Telehealth: Payer: Self-pay | Admitting: Pharmacist

## 2021-11-23 NOTE — Progress Notes (Signed)
? ? ?Chronic Care Management ?Pharmacy Assistant  ? ?Name: Karen Gonzales  MRN: [id number] DOB: Sep 06, 1945 ? ? ?Reason for Encounter: Disease State - General Adherence Call  ?  ? ?Recent office visits:  ?09/25/21 Annye Asa, MD - Family Medicine - Annual Exam - Labs were ordered. Follow up in 3-4 months.  ? ?Recent consult visits:  ?09/12/21 Buford Dresser [facility] - Cardiology - Dizziness - EKG performed. Scheduled 12/2021 to recheck echo (6 mos from prior) and monitor for progression. Follow up in 5 months.  ? ?07/17/22 Bridgette Chrsitopher MD - Cardiology - EKG and ECHO performed. No medication changes. Follow up in 6 months.  ? ?Hospital visits:  ?None in previous 6 months ? ? ?Medications: ?Outpatient Encounter Medications as of 11/23/2021  ?Medication Sig Note  ? amLODipine (NORVASC) 10 MG tablet TAKE 1 TABLET BY MOUTH EVERY DAY   ? Blood Glucose Monitoring Suppl (ONE TOUCH ULTRA 2) w/Device KIT Pt uses glucometer to check sugars twice daily. Dx E11.9   ? empagliflozin (JARDIANCE) 10 MG TABS tablet Take 1 tablet (10 mg total) by mouth daily. 06/21/2021: Patient assistance will be processed for Jardiance 25 mg for 2023 renewal  ? gabapentin (NEURONTIN) 300 MG capsule TAKE 2 CAPSULES BY MOUTH 3 TIMES A DAY   ? glipiZIDE (GLUCOTROL XL) 10 MG 24 hr tablet TAKE 1 TABLET (10 MG TOTAL) BY MOUTH DAILY WITH BREAKFAST.   ? glipiZIDE (GLUCOTROL XL) 5 MG 24 hr tablet Take 1 tablet (5 mg total) by mouth daily with breakfast.   ? losartan (COZAAR) 50 MG tablet TAKE 2 TABLETS BY MOUTH EVERY DAY   ? meloxicam (MOBIC) 15 MG tablet TAKE 1 TABLET BY MOUTH EVERY DAY WITH A MEAL   ? metFORMIN (GLUCOPHAGE) 500 MG tablet TAKE 2 TABLETS (1,000 MG TOTAL) BY MOUTH 2 (TWO) TIMES DAILY WITH A MEAL.   ? omeprazole (PRILOSEC) 40 MG capsule Take 1 capsule (40 mg total) by mouth 2 (two) times daily. (Patient taking differently: Take 40 mg by mouth daily.)   ? OneTouch Delica Lancets 70J MISC USE WHEN CHECKING BLOOD SUGAR AS DIRECTED   ?  ONETOUCH ULTRA test strip CHECK BLOOD SUGAR 1-2 TIMES DAILY   ? simvastatin (ZOCOR) 10 MG tablet TAKE 1 TABLET BY MOUTH EVERYDAY AT BEDTIME   ? sitaGLIPtin (JANUVIA) 100 MG tablet Take 100 mg by mouth daily.   ? Vitamin D, Ergocalciferol, (DRISDOL) 1.25 MG (50000 UNIT) CAPS capsule Take 1 capsule (50,000 Units total) by mouth every 7 (seven) days.   ? ?No facility-administered encounter medications on file as of 11/23/2021.  ? ? ?Have you had any problems recently with your health? ?Patient denied any recent issues or concerns with her health. She states "Im actually doing great". ? ?Have you had any problems with your pharmacy? ?Patient denied any problems with her current pharmacy.  ? ?What issues or side effects are you having with your medications? ?Patient denied any issues or side effects from her current medications.  ? ?What would you like me to pass along to Leata Mouse, CPP for them to help you with?  ?Patient states she was still waiting to hear back from her PAP application. Per CPP patient just needs to go by office and bring income documentation and sign her application. She will go by next week and get this done.  ? ?What can we do to take care of you better? ?Patient did not have any recommendations at this time.  ? ?Care Gaps ? ?  AWV: done 06/05/21 ?Colonoscopy: done 08/24/19 ?DM Eye Exam: scheduled 12/01/21  ?DM Foot Exam: done 05/23/22 ?Microalbumin: unknown  ?HbgAIC:  done 09/25/21 (7.3) ?DEXA: done 11/21/21 ?Mammogram: done 07/25/21 ? ? ?Star Rating Drugs ?simvastatin (ZOCOR) 10 MG tablet - last filled 09/08/21 90 days  ?MetFORMIN (GLUCOPHAGE) 500 MG tablet - last filled 11/04/21 90 days  ?glipiZIDE (GLUCOTROL XL) 5 MG 24 hr tablet - last filled 11/22/21 90 days  ?losartan (COZAAR) 50 MG tablet - last filled 08/21/21 90 days  ?empagliflozin (JARDIANCE) 10 MG TABS tablet - last filled 02/21/20 30 days (?PAP) ? ? ?Future Appointments  ?Date Time Provider [place]  ?12/15/2021  1:00 PM DW[facility]-ECHO/VAS  [facility]CV[facility]G DWB  ?12/25/2021  9:20 AM Buford Dresser, MD [facility]-CVD DWB  ?01/18/2022 10:30 AM [name], MD [facility] PEC  ? ? ? ?Liza Showfety, CCMA ?Clinical Pharmacist Assistant  ?([phone] ? ? ?

## 2021-12-15 ENCOUNTER — Ambulatory Visit (INDEPENDENT_AMBULATORY_CARE_PROVIDER_SITE_OTHER): Payer: PPO

## 2021-12-15 DIAGNOSIS — I350 Nonrheumatic aortic (valve) stenosis: Secondary | ICD-10-CM | POA: Diagnosis not present

## 2021-12-15 LAB — ECHOCARDIOGRAM COMPLETE
AR max vel: 0.99 cm2
AV Area VTI: 0.97 cm2
AV Area mean vel: 0.96 cm2
AV Mean grad: 33 mmHg
AV Peak grad: 56.3 mmHg
Ao pk vel: 3.75 m/s
Area-P 1/2: 2.66 cm2
Calc EF: 68.1 %
P 1/2 time: 363 msec
S' Lateral: 1.78 cm
Single Plane A2C EF: 51.3 %
Single Plane A4C EF: 77.8 %

## 2021-12-25 ENCOUNTER — Ambulatory Visit (HOSPITAL_BASED_OUTPATIENT_CLINIC_OR_DEPARTMENT_OTHER): Payer: PPO | Admitting: Cardiology

## 2021-12-25 VITALS — BP 134/68 | HR 84 | Ht 65.0 in | Wt 187.0 lb

## 2021-12-25 DIAGNOSIS — Z712 Person consulting for explanation of examination or test findings: Secondary | ICD-10-CM

## 2021-12-25 DIAGNOSIS — E782 Mixed hyperlipidemia: Secondary | ICD-10-CM | POA: Diagnosis not present

## 2021-12-25 DIAGNOSIS — I10 Essential (primary) hypertension: Secondary | ICD-10-CM

## 2021-12-25 DIAGNOSIS — Z9189 Other specified personal risk factors, not elsewhere classified: Secondary | ICD-10-CM

## 2021-12-25 DIAGNOSIS — I350 Nonrheumatic aortic (valve) stenosis: Secondary | ICD-10-CM | POA: Diagnosis not present

## 2021-12-25 DIAGNOSIS — E1140 Type 2 diabetes mellitus with diabetic neuropathy, unspecified: Secondary | ICD-10-CM | POA: Diagnosis not present

## 2021-12-25 DIAGNOSIS — Z7189 Other specified counseling: Secondary | ICD-10-CM | POA: Diagnosis not present

## 2021-12-25 MED ORDER — ASPIRIN EC 81 MG PO TBEC: 81.0000 mg | DELAYED_RELEASE_TABLET | Freq: Every day | ORAL | 3 refills | Status: AC

## 2021-12-25 MED ORDER — ROSUVASTATIN CALCIUM 10 MG PO TABS: 10.0000 mg | ORAL_TABLET | Freq: Every day | ORAL | 3 refills | Status: DC

## 2021-12-25 NOTE — Patient Instructions (Addendum)
Medication Instructions:  ?STOP: Simvastatin 10 mg daily ? ?START: Rosuvastatin 10 mg daily ?    Aspirin 81 mg daily ? ?*If you need a refill on your cardiac medications before your next appointment, please call your pharmacy* ? ? ?Lab Work: ?None ordered today ? ?If you have labs (blood work) drawn today and your tests are completely normal, you will receive your results only by: ?MyChart Message (if you have MyChart) OR ?A paper copy in the mail ?If you have any lab test that is abnormal or we need to change your treatment, we will call you to review the results. ? ? ?Testing/Procedures: ?Your physician has requested that you have an echocardiogram November, 2023. Echocardiography is a painless test that uses sound waves to create images of your heart. It provides your doctor with information about the size and shape of your heart and how well your heart?s chambers and valves are working. This procedure takes approximately one hour. There are no restrictions for this procedure. ?[place] ? ? ? ?Follow-Up: ?At [facility], you and your health needs are our priority.  As part of our continuing mission to provide you with exceptional heart care, we have created designated Provider Care Teams.  These Care Teams include your primary Cardiologist (physician) and Advanced Practice Providers (APPs -  Physician Assistants and Nurse Practitioners) who all work together to provide you with the care you need, when you need it. ? ?We recommend signing up for the patient portal called "MyChart".  Sign up information is provided on this After Visit Summary.  MyChart is used to connect with patients for Virtual Visits (Telemedicine).  Patients are able to view lab/test results, encounter notes, upcoming appointments, etc.  Non-urgent messages can be sent to your provider as well.   ?To learn more about what you can do with MyChart, go to [email or web address].   ? ?Your next appointment:   ?6  month(s) ? ?The format for your next appointment:   ?In Person ? ?Provider:   ?Buford Dresser, MD{ ? ?Important Information About Sugar ? ? ? ? ? ? ? ?Start aspirin 81 mg daily ?Changing simvastatin to rosuvastatin ?Echo in 6 mos. ?

## 2021-12-25 NOTE — Progress Notes (Signed)
Cardiology Office Note:    Date:  12/25/2021   ID:  Karen Gonzales, DOB 04-08-1946, MRN [id number]  PCP:  [name], MD  Cardiologist:  Buford Dresser, MD  Referring MD: [name], MD   CC: follow up   History of Present Illness:    Karen Gonzales is a 76 y.o. female with a hx of hypertension, hyperlipidemia, diabetes, DVT, GERD, hyponatremia (with diuretic use), fatty liver disease (nonalcoholic), anti-cardiolipin antibody syndrome, arthritis, and COVID-19, who is seen for follow up today. I initially met her 07/17/21 as a new consult at the request of [name], MD for the evaluation and management of aortic valve stenosis.  Today: Here to discuss results of echo. Here with family today. Notes that she has some back/shoulder pain, better when she is in the water. Continues to be generally fatigued, no change.   Denies chest pain, shortness of breath at rest or with normal exertion. No PND, orthopnea, LE edema or unexpected weight gain. No syncope or palpitations.   Past Medical History:  Diagnosis Date   Anti-cardiolipin antibody syndrome ([place]) 06/25/2011   Arthritis    Blood transfusion    had when knee was replaced   Bronchitis    history of   Chills    Complication of anesthesia    Cough    COVID-19    Diabetes mellitus    DVT (deep venous thrombosis) ([place]) 06/25/2011   Fatty liver disease, nonalcoholic    determinde by Ultrasound 11/11   GERD (gastroesophageal reflux disease)    H/O vitamin D deficiency    Heart murmur    Heart murmur    Benign   Hiatal hernia    Hyperlipidemia    Hypertension    Hyponatremia    with diuretic use   Osteopenia    PONV (postoperative nausea and vomiting)    Vertigo     Past Surgical History:  Procedure Laterality Date   ABDOMINAL HYSTERECTOMY     ACHILLES TENDON SURGERY     bilateral    CARPAL TUNNEL RELEASE     bilateral    CHOLECYSTECTOMY  1969   open   CRANIOTOMY  07/09/2011    Procedure: CRANIOTOMY HEMATOMA EVACUATION SUBDURAL;  Surgeon: Cooper Render Pool;  Location: [facility];  Service: Neurosurgery;  Laterality: Right;  Excision of Skull Lesion-Right Right Parietal Crani w/ Resection of Skull Tumor (1 1/2 hours - to follow)   [place] REPLACEMENT  2004   left knee replacement    KNEE SURGERY     total of 6 surgeries on both knees    REPAIR PERONEAL TENDONS ANKLE     TUBAL LIGATION     TUMOR REMOVAL     on pelvic bone     Current Medications: Current Outpatient Medications on File Prior to Visit  Medication Sig   amLODipine (NORVASC) 10 MG tablet TAKE 1 TABLET BY MOUTH EVERY DAY   Blood Glucose Monitoring Suppl (ONE TOUCH ULTRA 2) w/Device KIT Pt uses glucometer to check sugars twice daily. Dx E11.9   empagliflozin (JARDIANCE) 10 MG TABS tablet Take 1 tablet (10 mg total) by mouth daily.   gabapentin (NEURONTIN) 300 MG capsule TAKE 2 CAPSULES BY MOUTH 3 TIMES A DAY   glipiZIDE (GLUCOTROL XL) 5 MG 24 hr tablet Take 1 tablet (5 mg total) by mouth daily with breakfast.   losartan (COZAAR) 50 MG tablet TAKE 2 TABLETS BY  MOUTH EVERY DAY   meloxicam (MOBIC) 15 MG tablet TAKE 1 TABLET BY MOUTH EVERY DAY WITH A MEAL   metFORMIN (GLUCOPHAGE) 500 MG tablet TAKE 2 TABLETS (1,000 MG TOTAL) BY MOUTH 2 (TWO) TIMES DAILY WITH A MEAL.   OneTouch Delica Lancets 97D MISC USE WHEN CHECKING BLOOD SUGAR AS DIRECTED   ONETOUCH ULTRA test strip CHECK BLOOD SUGAR 1-2 TIMES DAILY   sitaGLIPtin (JANUVIA) 100 MG tablet Take 100 mg by mouth daily.   omeprazole (PRILOSEC) 40 MG capsule Take 1 capsule (40 mg total) by mouth 2 (two) times daily. (Patient taking differently: Take 40 mg by mouth daily.)   No current facility-administered medications on file prior to visit.     Allergies:   Nsaids, Reglan [metoclopramide], Estrogens, and Other   Social History   Tobacco Use   Smoking status: Never   Smokeless tobacco: Never  Vaping Use   Vaping  Use: Never used  Substance Use Topics   Alcohol use: No   Drug use: No    Family History: family history includes Breast cancer in her maternal grandmother and paternal aunt; Cancer in her father, maternal grandmother, and paternal grandmother; Healthy in her brother; Heart disease in her father; Hodgkin's lymphoma in her grandchild; Kidney disease in her mother; Osteoporosis in her father and mother. "Whole family" has diabetes. Father died of heart failure at 6 yo. Mother died at 91 yo due to kidney failure. Two brothers, one died from diabetes, and one living with diabetes and other health issues.  ROS:   Please see the history of present illness.  Additional pertinent ROS otherwise unremarkable.  EKGs/Labs/Other Studies Reviewed:    The following studies were reviewed today:  Echo 12/15/21:  1. Left ventricular ejection fraction, by estimation, is 60 to 65%. Left  ventricular ejection fraction by 3D volume is 60 %. The left ventricle has  normal function. The left ventricle has no regional wall motion abnormalities. Left ventricular diastolic  parameters are consistent with Grade I diastolic dysfunction (impaired relaxation).   2. Normal RV free wall strain index -29.9%. Right ventricular systolic  function is normal. The right ventricular size is normal. Tricuspid  regurgitation signal is inadequate for assessing PA pressure.   3. Left atrial size was severely dilated.   4. The mitral valve is abnormal. Trivial mitral valve regurgitation.   5. The aortic valve is tricuspid. There is moderate calcification of the  aortic valve. Aortic valve regurgitation is mild. Moderate to severe  aortic valve stenosis. Aortic regurgitation PHT measures 363 msec. Aortic  valve area, by VTI measures 0.97 cm.  Aortic valve mean gradient measures 33.0 mmHg. Aortic valve Vmax measures 3.75 m/s. DI is 0.34.  Echo 06/06/2021:  1. Left ventricular ejection fraction, by estimation, is 55 to 60%. The   left ventricle has normal function. The left ventricle has no regional  wall motion abnormalities. There is mild asymmetric left ventricular  hypertrophy of the posterior segment.  Left ventricular diastolic parameters are consistent with Grade I  diastolic dysfunction (impaired relaxation).   2. Right ventricular systolic function is normal. The right ventricular  size is normal.   3. Left atrial size was moderately dilated.   4. The mitral valve is normal in structure. No evidence of mitral valve  regurgitation. No evidence of mitral stenosis.   5. The aortic valve is calcified. Aortic valve regurgitation is not  visualized. Moderate to severe aortic valve stenosis ([place]ipicion for  Paradoxical Low flow low gradient aortic  stenosis). Aortic valve area, by  VTI measures 0.67 cm. Aortic valve mean  gradient measures 25.7 mmHg. Aortic valve Vmax measures 3.35 m/s, Indexed  AVA 0.35 cmsq/msq, SVI 26, DI 0.26.   6. The inferior vena cava is normal in size with greater than 50%  respiratory variability, suggesting right atrial pressure of 3 mmHg.   Comparison(s): A prior study was performed on 11/04/2017. Compared to prior study, Aortic stenosis have worsen. In 2019 there was mild AS with mean gradient 14 mmHg peak gradient 26 mmHg.   Left LE Venous Reflux 10/09/2019: Summary:  Left:  - No evidence of deep vein thrombosis seen in the left lower extremity.  - Venous reflux is noted in the deep venous system.  - Venous reflux is noted in the great saphenous vein.  - There is a tortuous anterior saphenous vein branch observed with several  varicosities.  - There is a minimal amount of chronic thrombous observed in the proximal  small saphenous vein, no reflux noted.   EKG:  EKG is personally reviewed.   09/12/21 NSR at 88 bpm 07/17/2021: NSR at 79 bpm  Recent Labs: 09/25/2021: ALT 26; BUN 14; Creatinine, Ser 0.85; Hemoglobin 11.4; Platelets 233.0; Potassium 4.2; Sodium 137; TSH 1.47    Recent Lipid Panel    Component Value Date/Time   CHOL 167 09/25/2021 1038   TRIG 172.0 (H) 09/25/2021 1038   HDL 53.20 09/25/2021 1038   CHOLHDL 3 09/25/2021 1038   VLDL 34.4 09/25/2021 1038   LDLCALC 80 09/25/2021 1038   LDLCALC 75 10/24/2017 1559   LDLDIRECT 104.0 04/08/2017 1125    Physical Exam:    VS:  BP (!) 148/70 (BP Location: Right Arm, Patient Position: Sitting)   Pulse 84   Ht 5' 5"  (1.651 m)   Wt 187 lb (84.8 kg)   SpO2 97%   BMI 31.12 kg/m     Wt Readings from Last 3 Encounters:  12/25/21 187 lb (84.8 kg)  09/25/21 185 lb 9.6 oz (84.2 kg)  09/12/21 189 lb 9.6 oz (86 kg)    GEN: Well nourished, well developed in no acute distress HEENT: Normal, moist mucous membranes NECK: No JVD CARDIAC: regular rhythm, normal S1 and S2, no rubs or gallops. 3/6 early to mid peaking systolic ejection murmur with preserved S2. VASCULAR: Radial and DP pulses 2+ bilaterally. No carotid bruits RESPIRATORY:  Clear to auscultation without rales, wheezing or rhonchi  ABDOMEN: Soft, non-tender, non-distended MUSCULOSKELETAL:  Ambulates independently SKIN: Warm and dry, no edema NEUROLOGIC:  Alert and oriented x 3. No focal neuro deficits noted. PSYCHIATRIC:  Normal affect    ASSESSMENT:    1. Nonrheumatic aortic valve stenosis   2. Encounter to discuss test results   3. Essential hypertension   4. Mixed hyperlipidemia   5. Type 2 diabetes mellitus with diabetic neuropathy, without long-term current use of insulin ([place])   6. At increased risk for cardiovascular disease   7. Cardiac risk counseling   8. Counseling on health promotion and disease prevention    PLAN:    Aortic stenosis -echo suggests moderate, we reviewed test results -we discussed at length today what symptoms would be highly concerning for AS -recheck echo in 6 mos to monitor for progression -counseled on red flag warning signs that need immediate medical attention  Hypertension: -elevated today, but  recently normal. -discussed home monitoring today,  does infrequently -continue amlodipine, losartan  Type II diabetes Mixed hyperlipidemia -on empagliflozin, glipizide, metformin, sitagliptin -elevated ASCVD risk,  currently on simvastatin, last LDL 80 -will change to rosuvastatin 10 mg to get LDL <70 -discussed aspirin, she is amenable to 81 mg daily, discussed what to watch for.  Cardiac risk counseling and prevention recommendations: -recommend heart healthy/Mediterranean diet, with whole grains, fruits, vegetable, fish, lean meats, nuts, and olive oil. Limit salt. -recommend moderate walking, 3-5 times/week for 30-50 minutes each session. Aim for at least 150 minutes.week. Goal should be pace of 3 miles/hours, or walking 1.5 miles in 30 minutes -recommend avoidance of tobacco products. Avoid excess alcohol. -ASCVD risk score: The 10-year ASCVD risk score (Arnett DK, et al., 2019) is: 41.4%   Values used to calculate the score:     Age: 30 years     Sex: Female     Is Non-Hispanic African American: No     Diabetic: Yes     Tobacco smoker: No     Systolic Blood Pressure: 329 mmHg     Is BP treated: Yes     HDL Cholesterol: 53.2 mg/dL     Total Cholesterol: 167 mg/dL    Plan for follow up: 6 months or sooner as needed, following Echo.  Buford Dresser, MD, PhD, [facility]    Medication Adjustments/Labs and Tests Ordered: Current medicines are reviewed at length with the patient today.  Concerns regarding medicines are outlined above.   Orders Placed This Encounter  Procedures   ECHOCARDIOGRAM COMPLETE    Meds ordered this encounter  Medications   aspirin EC 81 MG tablet    Sig: Take 1 tablet (81 mg total) by mouth daily. Swallow whole.    Dispense:  90 tablet    Refill:  3   rosuvastatin (CRESTOR) 10 MG tablet    Sig: Take 1 tablet (10 mg total) by mouth daily.    Dispense:  90 tablet    Refill:  3    Replaces simvastatin    Patient  Instructions  Medication Instructions:  STOP: Simvastatin 10 mg daily  START: Rosuvastatin 10 mg daily     Aspirin 81 mg daily  *If you need a refill on your cardiac medications before your next appointment, please call your pharmacy*   Lab Work: None ordered today  If you have labs (blood work) drawn today and your tests are completely normal, you will receive your results only by: [place] (if you have MyChart) OR A paper copy in the mail If you have any lab test that is abnormal or we need to change your treatment, we will call you to review the results.   Testing/Procedures: Your physician has requested that you have an echocardiogram November, 2023. Echocardiography is a painless test that uses sound waves to create images of your heart. It provides your doctor with information about the size and shape of your heart and how well your heart's chambers and valves are working. This procedure takes approximately one hour. There are no restrictions for this procedure. [place], you and your health needs are our priority.  As part of our continuing mission to provide you with exceptional heart care, we have created designated Provider Care Teams.  These Care Teams include your primary Cardiologist (physician) and Advanced Practice Providers (APPs -  Physician Assistants and Nurse Practitioners) who all work together to provide you with the care you need, when you need it.  We recommend signing up for the patient portal called "MyChart".  Sign up  information is provided on this After Visit Summary.  MyChart is used to connect with patients for Virtual Visits (Telemedicine).  Patients are able to view lab/test results, encounter notes, upcoming appointments, etc.  Non-urgent messages can be sent to your provider as well.   To learn more about what you can do with MyChart, go to [email or web address].    Your next  appointment:   6 month(s)  The format for your next appointment:   In Person  Provider:   Buford Dresser, MD{  Important Information About Sugar        Start aspirin 81 mg daily Changing simvastatin to rosuvastatin Echo in 6 mos.   Signed, Buford Dresser, MD PhD 12/25/2021   [place]

## 2022-01-01 ENCOUNTER — Ambulatory Visit (INDEPENDENT_AMBULATORY_CARE_PROVIDER_SITE_OTHER): Payer: PPO | Admitting: Family Medicine

## 2022-01-01 ENCOUNTER — Encounter: Payer: Self-pay | Admitting: Family Medicine

## 2022-01-01 VITALS — BP 136/72 | HR 77 | Temp 97.6°F | Resp 16 | Ht 65.0 in | Wt 183.8 lb

## 2022-01-01 DIAGNOSIS — R350 Frequency of micturition: Secondary | ICD-10-CM | POA: Diagnosis not present

## 2022-01-01 DIAGNOSIS — R300 Dysuria: Secondary | ICD-10-CM | POA: Diagnosis not present

## 2022-01-01 LAB — POCT URINALYSIS DIPSTICK
Bilirubin, UA: NEGATIVE
Blood, UA: NEGATIVE
Glucose, UA: NEGATIVE
Ketones, UA: NEGATIVE
Nitrite, UA: NEGATIVE
Protein, UA: NEGATIVE
Spec Grav, UA: 1.025 (ref 1.010–1.025)
Urobilinogen, UA: 0.2 E.U./dL
pH, UA: 7 (ref 5.0–8.0)

## 2022-01-01 MED ORDER — CEPHALEXIN 500 MG PO CAPS: 500.0000 mg | ORAL_CAPSULE | Freq: Two times a day (BID) | ORAL | 0 refills | Status: AC

## 2022-01-01 NOTE — Patient Instructions (Signed)
Follow up as needed or as scheduled We'll let you know about your urine culture and change antibiotics if needed Continue to drink LOTS of fluids START the Cephalexin twice daily Call with any questions or concerns Hang in there!!!

## 2022-01-01 NOTE — Progress Notes (Signed)
   Subjective:    Patient ID: [name], female    DOB: 08/27/1945, 76 y.o.   MRN: [id number]  HPI UTI- sxs started Saturday morning upon waking.  Started Uristat w/ some relief.  + burning w/ urination.  No blood in urine- 'but it's so orange I can't tell'.  Increased frequency.  + hesitancy.  Has increased water intake.  No fevers but some chills.  + chronic LBP.  + suprapubic pain.  Feels similar to previous infxns.   Review of Systems For ROS see HPI     Objective:   Physical Exam Vitals reviewed.  Constitutional:      General: She is not in acute distress.    Appearance: Normal appearance. She is well-developed. She is not ill-appearing.  Abdominal:     General: There is no distension.     Palpations: Abdomen is soft.     Tenderness: There is abdominal tenderness (+ suprapubic TTP but no CVA tenderness). There is no guarding or rebound.  Skin:    General: Skin is warm and dry.  Neurological:     Mental Status: She is alert.          Assessment & Plan:   Dysuria- new.  Pt's sxs and UA consistent w/ infxn.  Start abx.  Send urine for culture.  Adjust meds prn.  Pt expressed understanding and is in agreement w/ plan.

## 2022-01-03 LAB — URINE CULTURE
MICRO NUMBER:: 13431694
SPECIMEN QUALITY:: ADEQUATE

## 2022-01-03 MED ORDER — SULFAMETHOXAZOLE-TRIMETHOPRIM 800-160 MG PO TABS: 1.0000 | ORAL_TABLET | Freq: Two times a day (BID) | ORAL | 0 refills | Status: DC

## 2022-01-03 NOTE — Addendum Note (Signed)
Addended by: [name] on: 01/03/2022 03:58 PM   Modules accepted: Orders

## 2022-01-04 NOTE — Progress Notes (Signed)
Spoke w/ pt and advised of urine culture results . Pt states she has picked her new antiboditic  and has started it .

## 2022-01-07 ENCOUNTER — Encounter (HOSPITAL_BASED_OUTPATIENT_CLINIC_OR_DEPARTMENT_OTHER): Payer: Self-pay | Admitting: Cardiology

## 2022-01-18 ENCOUNTER — Ambulatory Visit (INDEPENDENT_AMBULATORY_CARE_PROVIDER_SITE_OTHER): Payer: PPO | Admitting: Family Medicine

## 2022-01-18 ENCOUNTER — Encounter: Payer: Self-pay | Admitting: Family Medicine

## 2022-01-18 VITALS — BP 122/60 | HR 77 | Temp 98.2°F | Resp 16 | Ht 65.0 in | Wt 184.2 lb

## 2022-01-18 DIAGNOSIS — E1140 Type 2 diabetes mellitus with diabetic neuropathy, unspecified: Secondary | ICD-10-CM | POA: Diagnosis not present

## 2022-01-18 DIAGNOSIS — R252 Cramp and spasm: Secondary | ICD-10-CM | POA: Diagnosis not present

## 2022-01-18 LAB — BASIC METABOLIC PANEL
BUN: 19 mg/dL (ref 6–23)
CO2: 26 mEq/L (ref 19–32)
Calcium: 9.9 mg/dL (ref 8.4–10.5)
Chloride: 100 mEq/L (ref 96–112)
Creatinine, Ser: 0.86 mg/dL (ref 0.40–1.20)
GFR: 66.45 mL/min (ref >60.00)
Glucose, Bld: 99 mg/dL (ref 70–99)
Potassium: 4.7 mEq/L (ref 3.5–5.1)
Sodium: 135 mEq/L (ref 135–145)

## 2022-01-18 LAB — HEMOGLOBIN A1C: Hgb A1c MFr Bld: 7.3 % — ABNORMAL HIGH (ref 4.6–6.5)

## 2022-01-18 LAB — MAGNESIUM: Magnesium: 1.7 mg/dL (ref 1.5–2.5)

## 2022-01-18 LAB — MICROALBUMIN / CREATININE URINE RATIO
Creatinine,U: 120.5 mg/dL
Microalb Creat Ratio: 5.3 mg/g (ref 0.0–30.0)
Microalb, Ur: 6.4 mg/dL — ABNORMAL HIGH (ref 0.0–1.9)

## 2022-01-18 NOTE — Assessment & Plan Note (Signed)
Chronic problem.  Currently on Jardiance 1m daily, Glipizide 55mdaily, Metformin 100041mID, and Januvia 100m79mily.  She just restarted the Januvia ~2 weeks ago b/c of a mix up w/ pt assistance.  UTD on foot exam and eye exam.  On ARB for renal protection but will get microalbumin today.  Currently asymptomatic w/ exception of known neuropathy, stable shortness of breath.  Check labs.  Adjust meds prn

## 2022-01-18 NOTE — Patient Instructions (Signed)
Follow up in 3-4 months to recheck sugar, BP, and cholesterol We'll notify you of your lab results and make any changes if needed Continue to eat a low carb diet and get regular physical activity- walking is great!! Try drinking tonic water or eating mustard before bed to help w/ leg cramps Call with any questions or concerns Stay Safe!  Stay Healthy! Have a great summer!

## 2022-01-18 NOTE — Progress Notes (Signed)
   Subjective:    Patient ID: Karen Gonzales, female    DOB: 03-01-1946, 76 y.o.   MRN: [id number]  HPI DM- chronic problem, on Jardiance 49m daily, Glipizide 543mdaily, Metformin 100033mID, Januvia 100m4mily.  Last A1C 7.3%  UTD on foot exam, eye exam.  On ARB for renal protection.  Due for microalbumin.  No CP, SOB above baseline, HAs, visual changes, abd pain, N/V.  Denies symptomatic lows.  Nocturnal foot cramps- pt reports sxs started 1-2 months ago.  R ankle will spasm and turn in while L toes will extend upward.  Pt reports good water intake.     Review of Systems For ROS see HPI     Objective:   Physical Exam Vitals reviewed.  Constitutional:      General: She is not in acute distress.    Appearance: Normal appearance. She is well-developed. She is not ill-appearing.  HENT:     Head: Normocephalic and atraumatic.  Eyes:     Conjunctiva/sclera: Conjunctivae normal.     Pupils: Pupils are equal, round, and reactive to light.  Neck:     Thyroid: No thyromegaly.  Cardiovascular:     Rate and Rhythm: Normal rate and regular rhythm.     Pulses: Normal pulses.     Heart sounds: Murmur (II/VI SEM) heard.  Pulmonary:     Effort: Pulmonary effort is normal. No respiratory distress.     Breath sounds: Normal breath sounds.  Abdominal:     General: There is no distension.     Palpations: Abdomen is soft.     Tenderness: There is no abdominal tenderness.  Musculoskeletal:     Cervical back: Normal range of motion and neck supple.     Right lower leg: No edema.     Left lower leg: No edema.  Lymphadenopathy:     Cervical: No cervical adenopathy.  Skin:    General: Skin is warm and dry.  Neurological:     General: No focal deficit present.     Mental Status: She is alert and oriented to person, place, and time.  Psychiatric:        Mood and Affect: Mood normal.        Behavior: Behavior normal.           Assessment & Plan:  Nocturnal leg cramps- new.  Pt  reports sxs x1-2 months.  Pt has good water intake.  Doesn't like tonic water.  Will at times get up and eat mustard.  Encouraged her to start mustard nightly and will check K+ and Mag.  Pt expressed understanding and is in agreement w/ plan.

## 2022-01-19 ENCOUNTER — Telehealth: Payer: Self-pay

## 2022-01-19 NOTE — Telephone Encounter (Signed)
-----   Message from [name], MD sent at 01/19/2022  7:47 AM EDT ----- Labs are stable and look good!

## 2022-01-19 NOTE — Telephone Encounter (Signed)
Spoke w/ pt and advised of lab results

## 2022-02-07 ENCOUNTER — Other Ambulatory Visit: Payer: Self-pay | Admitting: Family Medicine

## 2022-02-07 DIAGNOSIS — E1140 Type 2 diabetes mellitus with diabetic neuropathy, unspecified: Secondary | ICD-10-CM

## 2022-02-09 ENCOUNTER — Other Ambulatory Visit: Payer: Self-pay

## 2022-02-09 ENCOUNTER — Other Ambulatory Visit: Payer: Self-pay | Admitting: Family Medicine

## 2022-02-09 MED ORDER — MELOXICAM 15 MG PO TABS: ORAL_TABLET | ORAL | 2 refills | Status: DC

## 2022-02-22 DIAGNOSIS — K219 Gastro-esophageal reflux disease without esophagitis: Secondary | ICD-10-CM | POA: Diagnosis not present

## 2022-02-22 DIAGNOSIS — K648 Other hemorrhoids: Secondary | ICD-10-CM | POA: Diagnosis not present

## 2022-02-23 ENCOUNTER — Other Ambulatory Visit: Payer: Self-pay | Admitting: Family Medicine

## 2022-03-05 ENCOUNTER — Other Ambulatory Visit: Payer: Self-pay | Admitting: Family Medicine

## 2022-03-08 ENCOUNTER — Other Ambulatory Visit: Payer: Self-pay | Admitting: Family Medicine

## 2022-03-08 DIAGNOSIS — I10 Essential (primary) hypertension: Secondary | ICD-10-CM

## 2022-04-09 ENCOUNTER — Other Ambulatory Visit: Payer: Self-pay | Admitting: Family Medicine

## 2022-04-09 NOTE — Telephone Encounter (Signed)
Patient is requesting a refill of the following medications: Requested Prescriptions   Pending Prescriptions Disp Refills   gabapentin (NEURONTIN) 300 MG capsule [Pharmacy Med Name: GABAPENTIN 300 MG CAPSULE] 180 capsule 3    Sig: TAKE 2 CAPSULES BY MOUTH 3 TIMES A DAY    Date of patient request: 8/28/ Last office visit: 01/01/22 Date of last refill: 10/11/21 Last refill amount: 180

## 2022-04-30 ENCOUNTER — Ambulatory Visit (INDEPENDENT_AMBULATORY_CARE_PROVIDER_SITE_OTHER): Payer: PPO | Admitting: Family Medicine

## 2022-04-30 ENCOUNTER — Encounter: Payer: Self-pay | Admitting: Family Medicine

## 2022-04-30 VITALS — BP 128/60 | HR 74 | Temp 97.4°F | Resp 16 | Ht 65.0 in | Wt 186.0 lb

## 2022-04-30 DIAGNOSIS — Z23 Encounter for immunization: Secondary | ICD-10-CM | POA: Diagnosis not present

## 2022-04-30 DIAGNOSIS — E1140 Type 2 diabetes mellitus with diabetic neuropathy, unspecified: Secondary | ICD-10-CM | POA: Diagnosis not present

## 2022-04-30 DIAGNOSIS — E785 Hyperlipidemia, unspecified: Secondary | ICD-10-CM

## 2022-04-30 DIAGNOSIS — E1169 Type 2 diabetes mellitus with other specified complication: Secondary | ICD-10-CM

## 2022-04-30 DIAGNOSIS — I10 Essential (primary) hypertension: Secondary | ICD-10-CM

## 2022-04-30 LAB — LIPID PANEL
Cholesterol: 111 mg/dL (ref 0–200)
HDL: 48 mg/dL (ref >39.00)
LDL Cholesterol: 34 mg/dL (ref 0–99)
NonHDL: 62.78
Total CHOL/HDL Ratio: 2
Triglycerides: 146 mg/dL (ref 0.0–149.0)
VLDL: 29.2 mg/dL (ref 0.0–40.0)

## 2022-04-30 LAB — HEPATIC FUNCTION PANEL
ALT: 30 U/L (ref 0–35)
AST: 25 U/L (ref 0–37)
Albumin: 4.3 g/dL (ref 3.5–5.2)
Alkaline Phosphatase: 52 U/L (ref 39–117)
Bilirubin, Direct: 0.1 mg/dL (ref 0.0–0.3)
Total Bilirubin: 0.5 mg/dL (ref 0.2–1.2)
Total Protein: 8.2 g/dL (ref 6.0–8.3)

## 2022-04-30 LAB — BASIC METABOLIC PANEL
BUN: 17 mg/dL (ref 6–23)
CO2: 26 mEq/L (ref 19–32)
Calcium: 9.8 mg/dL (ref 8.4–10.5)
Chloride: 100 mEq/L (ref 96–112)
Creatinine, Ser: 0.82 mg/dL (ref 0.40–1.20)
GFR: 70.22 mL/min (ref >60.00)
Glucose, Bld: 120 mg/dL — ABNORMAL HIGH (ref 70–99)
Potassium: 4.3 mEq/L (ref 3.5–5.1)
Sodium: 137 mEq/L (ref 135–145)

## 2022-04-30 LAB — CBC WITH DIFFERENTIAL/PLATELET
Basophils Absolute: 0.1 10*3/uL (ref 0.0–0.1)
Basophils Relative: 1.3 % (ref 0.0–3.0)
Eosinophils Absolute: 0.2 10*3/uL (ref 0.0–0.7)
Eosinophils Relative: 3.2 % (ref 0.0–5.0)
HCT: 34.7 % — ABNORMAL LOW (ref 36.0–46.0)
Hemoglobin: 11.3 g/dL — ABNORMAL LOW (ref 12.0–15.0)
Lymphocytes Relative: 27.7 % (ref 12.0–46.0)
Lymphs Abs: 1.9 10*3/uL (ref 0.7–4.0)
MCHC: 32.5 g/dL (ref 30.0–36.0)
MCV: 80 fl (ref 78.0–100.0)
Monocytes Absolute: 0.6 10*3/uL (ref 0.1–1.0)
Monocytes Relative: 9.4 % (ref 3.0–12.0)
Neutro Abs: 3.9 10*3/uL (ref 1.4–7.7)
Neutrophils Relative %: 58.4 % (ref 43.0–77.0)
Platelets: 182 10*3/uL (ref 150.0–400.0)
RBC: 4.33 Mil/uL (ref 3.87–5.11)
RDW: 16 % — ABNORMAL HIGH (ref 11.5–15.5)
WBC: 6.7 10*3/uL (ref 4.0–10.5)

## 2022-04-30 LAB — HEMOGLOBIN A1C: Hgb A1c MFr Bld: 7.5 % — ABNORMAL HIGH (ref 4.6–6.5)

## 2022-04-30 LAB — TSH: TSH: 1.99 u[IU]/mL (ref 0.35–5.50)

## 2022-04-30 NOTE — Patient Instructions (Signed)
Follow up in 3-4 months to recheck diabetes We'll notify you of your lab results and make any changes if needed Continue to work on low carb diet and regular walking Have them send me a copy of your eye exam Call with any questions or concerns Stay Safe!  Stay Healthy! Happy Fall!!!

## 2022-04-30 NOTE — Assessment & Plan Note (Signed)
Chronic problem.  On Jardiance 35m daily, Glipizide XL 5 mg daily, Metformin 10015mBID, Januvia 10064maily.  Eye exam scheduled.  UTD on foot exam.  UTD on microalbumin.  Check labs.  Adjust meds prn

## 2022-04-30 NOTE — Assessment & Plan Note (Signed)
Chronic problem.  On Losartan 188m daily, Amlodipine 142mdaily w/ good control.  Currently asymptomatic w/ exception of baseline SOB.  Check labs due to ARB but no anticipated med changes.

## 2022-04-30 NOTE — Progress Notes (Signed)
   Subjective:    Patient ID: Karen Gonzales, female    DOB: 05/12/1946, 76 y.o.   MRN: [id number]  HPI DM- chronic problem, on Jardiance 37m daily, Glipizide XL 519mdaily, Metformin 100068mID, Januvia 100m30mily.  Due for eye exam- scheduled for November.  UTD on foot exam.  UTD on microalbumin.  Denies symptomatic lows.  + known neuropathy.  HTN- chronic problem, on Losartan 100mg80mly and Amlodipine 10mg 43my w/ good control.  No CP, SOB above baseline, HAs, visual changes, edema.  Hyperlipidemia- chronic problem, on Crestor 10mg. 74mabd pain, N/V.   Review of Systems For ROS see HPI     Objective:   Physical Exam Vitals reviewed.  Constitutional:      General: She is not in acute distress.    Appearance: Normal appearance. She is well-developed. She is obese. She is not ill-appearing.  HENT:     Head: Normocephalic and atraumatic.  Eyes:     Conjunctiva/sclera: Conjunctivae normal.     Pupils: Pupils are equal, round, and reactive to light.  Neck:     Thyroid: No thyromegaly.  Cardiovascular:     Rate and Rhythm: Normal rate and regular rhythm.     Pulses: Normal pulses.     Heart sounds: Murmur (II/VI SEM at RUSB) heard.  Pulmonary:     Effort: Pulmonary effort is normal. No respiratory distress.     Breath sounds: Normal breath sounds.  Abdominal:     General: There is no distension.     Palpations: Abdomen is soft.     Tenderness: There is no abdominal tenderness.  Musculoskeletal:     Cervical back: Normal range of motion and neck supple.     Right lower leg: No edema.     Left lower leg: No edema.  Lymphadenopathy:     Cervical: No cervical adenopathy.  Skin:    General: Skin is warm and dry.  Neurological:     Mental Status: She is alert and oriented to person, place, and time.  Psychiatric:        Behavior: Behavior normal.           Assessment & Plan:

## 2022-04-30 NOTE — Assessment & Plan Note (Signed)
Chronic problem.  On Crestor 82m daily w/o difficulty.  Check labs.  Adjust meds prn

## 2022-05-24 ENCOUNTER — Telehealth: Payer: Self-pay | Admitting: Family Medicine

## 2022-05-24 NOTE — Telephone Encounter (Signed)
Noted I will send him a pt

## 2022-05-24 NOTE — Telephone Encounter (Signed)
Pt dropped off forms for Karen Gonzales. Placed forms on desk.

## 2022-05-27 ENCOUNTER — Other Ambulatory Visit: Payer: Self-pay | Admitting: Family Medicine

## 2022-05-31 ENCOUNTER — Telehealth: Payer: Self-pay

## 2022-05-31 NOTE — Telephone Encounter (Signed)
Forms have been faxed and placed in scan

## 2022-05-31 NOTE — Telephone Encounter (Signed)
Patient states she received a phone call from you in regard to her forms. I do not see any open messages about this could you please advise reason for call or call the patient back please?

## 2022-05-31 NOTE — Telephone Encounter (Signed)
Was following up on assistance forms.  Placed in Dr. Birdie Riddle box yesterday.  Form had Jardiance 53m written on it but her chart says 153m  She confirmed just now her bottles at home have been 2572mo that is why she put that on the form.  Will fax once signed.  Thanks!!

## 2022-05-31 NOTE — Telephone Encounter (Signed)
Form completed and returned to [facility]

## 2022-06-06 ENCOUNTER — Ambulatory Visit (INDEPENDENT_AMBULATORY_CARE_PROVIDER_SITE_OTHER): Payer: PPO

## 2022-06-06 VITALS — Ht 65.0 in | Wt 183.0 lb

## 2022-06-06 DIAGNOSIS — Z0000 Encounter for general adult medical examination without abnormal findings: Secondary | ICD-10-CM | POA: Diagnosis not present

## 2022-06-06 NOTE — Patient Instructions (Signed)
[name] , Thank you for taking time to come for your Medicare Wellness Visit. I appreciate your ongoing commitment to your health goals. Please review the following plan we discussed and let me know if I can assist you in the future.   These are the goals we discussed:  Goals      A1c <7.5%     CARE PLAN ENTRY Current Barriers:  Diabetes: type 2; complicated by chronic medical conditions including high blood pressure Lab Results  Component Value Date   HGBA1C 8.3 (H) 11/30/2019   Lab Results  Component Value Date   CREATININE 0.63 11/30/2019   CREATININE 0.67 08/31/2019   CREATININE 0.74 05/28/2019  Current antihyperglycemic regimen:  Glipizide 10 mg daily Jardiance 10 mg daily  Metformin 1000 mg twice daily  Denies hypoglycemic symptoms, including dizziness, lightheadedness, shaking, sweating. Current exercise: limited due to upcoming ankle surgery. Would like to resume walking.   Recent FBG Readings: 152 today, typically 140s, 150s. Cardiovascular risk reduction: Pharmacist Clinical Goal(s):  Over the next 180 days, patient will work with PharmD and primary care provider to address A1c goal <7.5%. Interventions: Comprehensive medication review performed, medication list updated in electronic medical record Inter-disciplinary care team collaboration Jardiance patient assistance Patient Self Care Activities:  Patient will check blood glucose once daily, document, and provide at future appointments Patient will focus on medication adherence by continuing current medication management  Patient will take medications as prescribed Patient will contact provider with any episodes of hypoglycemia Patient will report any questions or concerns to provider  Initial goal documentation.     Blood pressure <140/90     CARE PLAN ENTRY Current Barriers:  Current antihypertensive regimen:   Losartan 100 mg daily  Amlodipine 5 mg daily  Last practice recorded BP readings:  BP  Readings from Last 3 Encounters:  11/30/19 (!) 142/70  10/09/19 (!) 151/75  08/31/19 124/84  Pharmacist Clinical Goal(s):  Over the next 180 days, patient will work with PharmD and providers to optimize antihypertensive regimen BP <140/90 Interventions: Inter-disciplinary care team collaboration Comprehensive medication review performed; medication list updated in the electronic medical record.  Patient Self Care Activities:  Patient will continue to check BP routinely, at least once weekly - document, and provide at future appointments Patient will focus on medication adherence by continuing current medication management.  Initial goal documentation.     LDL Cholesterol <70     CARE PLAN ENTRY Current Barriers:  Current antihyperlipidemic regimen:  Simvastatin 10 mg daily Previous antihyperlipidemic medications tried: Pravastatin 20 mg   Most recent lipid panel:     Component Value Date/Time   CHOL 147 11/30/2019 0949   TRIG 199.0 (H) 11/30/2019 0949   HDL 43.70 11/30/2019 0949   CHOLHDL 3 11/30/2019 0949   VLDL 39.8 11/30/2019 0949   LDLCALC 64 11/30/2019 0949   LDLCALC 75 10/24/2017 1559   LDLDIRECT 104.0 04/08/2017 1125  Pharmacist Clinical Goal(s):  Over the next 180 days, patient will work with PharmD and providers towards optimized antihyperlipidemic therapy LDL <70 Interventions: Comprehensive medication review performed; medication list updated in electronic medical record.  Patient Self Care Activities:  Patient will focus on medication adherence by continuing current medication management.  Initial goal documentation.      Patient Stated     Increase activity by walking more     Patient Stated     Maintain current lifestyle        This is a list of the screening recommended  for you and due dates:  Health Maintenance  Topic Date Due   Tetanus Vaccine  03/12/2021   Eye exam for diabetics  01/24/2022   Mammogram  07/25/2022   Hemoglobin A1C  10/29/2022    Yearly kidney health urinalysis for diabetes  01/19/2023   Yearly kidney function blood test for diabetes  05/01/2023   Complete foot exam   05/01/2023   Medicare Annual Wellness Visit  07/07/2023   DEXA scan (bone density measurement)  11/22/2023   Colon Cancer Screening  08/23/2029   Pneumonia Vaccine  Completed   Flu Shot  Completed   Hepatitis C Screening: USPSTF Recommendation to screen - Ages 18-79 yo.  Completed   HPV Vaccine  Aged Out   COVID-19 Vaccine  Discontinued   Zoster (Shingles) Vaccine  Discontinued    Advanced directives: Advance directive discussed with you today. I have provided a copy for you to complete at home and have notarized. Once this is complete please bring a copy in to our office so we can scan it into your chart.   Conditions/risks identified: Aim for 30 minutes of exercise or brisk walking, 6-8 glasses of water, and 5 servings of fruits and vegetables each day.   Next appointment: Follow up in one year for your annual wellness visit    Preventive Care 65 Years and Older, Female Preventive care refers to lifestyle choices and visits with your health care provider that can promote health and wellness. What does preventive care include? A yearly physical exam. This is also called an annual well check. Dental exams once or twice a year. Routine eye exams. Ask your health care provider how often you should have your eyes checked. Personal lifestyle choices, including: Daily care of your teeth and gums. Regular physical activity. Eating a healthy diet. Avoiding tobacco and drug use. Limiting alcohol use. Practicing safe sex. Taking low-dose aspirin every day. Taking vitamin and mineral supplements as recommended by your health care provider. What happens during an annual well check? The services and screenings done by your health care provider during your annual well check will depend on your age, overall health, lifestyle risk factors, and family  history of disease. Counseling  Your health care provider may ask you questions about your: Alcohol use. Tobacco use. Drug use. Emotional well-being. Home and relationship well-being. Sexual activity. Eating habits. History of falls. Memory and ability to understand (cognition). Work and work Statistician. Reproductive health. Screening  You may have the following tests or measurements: Height, weight, and BMI. Blood pressure. Lipid and cholesterol levels. These may be checked every 5 years, or more frequently if you are over 74 years old. Skin check. Lung cancer screening. You may have this screening every year starting at age 22 if you have a 30-pack-year history of smoking and currently smoke or have quit within the past 15 years. Fecal occult blood test (FOBT) of the stool. You may have this test every year starting at age 22. Flexible sigmoidoscopy or colonoscopy. You may have a sigmoidoscopy every 5 years or a colonoscopy every 10 years starting at age 48. Hepatitis C blood test. Hepatitis B blood test. Sexually transmitted disease (STD) testing. Diabetes screening. This is done by checking your blood sugar (glucose) after you have not eaten for a while (fasting). You may have this done every 1-3 years. Bone density scan. This is done to screen for osteoporosis. You may have this done starting at age 43. Mammogram. This may be done every 1-2 years.  Talk to your health care provider about how often you should have regular mammograms. Talk with your health care provider about your test results, treatment options, and if necessary, the need for more tests. Vaccines  Your health care provider may recommend certain vaccines, such as: Influenza vaccine. This is recommended every year. Tetanus, diphtheria, and acellular pertussis (Tdap, Td) vaccine. You may need a Td booster every 10 years. Zoster vaccine. You may need this after age 95. Pneumococcal 13-valent conjugate (PCV13)  vaccine. One dose is recommended after age 61. Pneumococcal polysaccharide (PPSV23) vaccine. One dose is recommended after age 90. Talk to your health care provider about which screenings and vaccines you need and how often you need them. This information is not intended to replace advice given to you by your health care provider. Make sure you discuss any questions you have with your health care provider. Document Released: 08/26/2015 Document Revised: 04/18/2016 Document Reviewed: 05/31/2015 Elsevier Interactive Patient Education  2017 [place] Prevention in the Home Falls can cause injuries. They can happen to people of all ages. There are many things you can do to make your home safe and to help prevent falls. What can I do on the outside of my home? Regularly fix the edges of walkways and driveways and fix any cracks. Remove anything that might make you trip as you walk through a door, such as a raised step or threshold. Trim any bushes or trees on the path to your home. Use bright outdoor lighting. Clear any walking paths of anything that might make someone trip, such as rocks or tools. Regularly check to see if handrails are loose or broken. Make sure that both sides of any steps have handrails. Any raised decks and porches should have guardrails on the edges. Have any leaves, snow, or ice cleared regularly. Use sand or salt on walking paths during winter. Clean up any spills in your garage right away. This includes oil or grease spills. What can I do in the bathroom? Use night lights. Install grab bars by the toilet and in the tub and shower. Do not use towel bars as grab bars. Use non-skid mats or decals in the tub or shower. If you need to sit down in the shower, use a plastic, non-slip stool. Keep the floor dry. Clean up any water that spills on the floor as soon as it happens. Remove soap buildup in the tub or shower regularly. Attach bath mats securely with  double-sided non-slip rug tape. Do not have throw rugs and other things on the floor that can make you trip. What can I do in the bedroom? Use night lights. Make sure that you have a light by your bed that is easy to reach. Do not use any sheets or blankets that are too big for your bed. They should not hang down onto the floor. Have a firm chair that has side arms. You can use this for support while you get dressed. Do not have throw rugs and other things on the floor that can make you trip. What can I do in the kitchen? Clean up any spills right away. Avoid walking on wet floors. Keep items that you use a lot in easy-to-reach places. If you need to reach something above you, use a strong step stool that has a grab bar. Keep electrical cords out of the way. Do not use floor polish or wax that makes floors slippery. If you must use wax, use non-skid floor wax.  Do not have throw rugs and other things on the floor that can make you trip. What can I do with my stairs? Do not leave any items on the stairs. Make sure that there are handrails on both sides of the stairs and use them. Fix handrails that are broken or loose. Make sure that handrails are as long as the stairways. Check any carpeting to make sure that it is firmly attached to the stairs. Fix any carpet that is loose or worn. Avoid having throw rugs at the top or bottom of the stairs. If you do have throw rugs, attach them to the floor with carpet tape. Make sure that you have a light switch at the top of the stairs and the bottom of the stairs. If you do not have them, ask someone to add them for you. What else can I do to help prevent falls? Wear shoes that: Do not have high heels. Have rubber bottoms. Are comfortable and fit you well. Are closed at the toe. Do not wear sandals. If you use a stepladder: Make sure that it is fully opened. Do not climb a closed stepladder. Make sure that both sides of the stepladder are locked  into place. Ask someone to hold it for you, if possible. Clearly mark and make sure that you can see: Any grab bars or handrails. First and last steps. Where the edge of each step is. Use tools that help you move around (mobility aids) if they are needed. These include: Canes. Walkers. Scooters. Crutches. Turn on the lights when you go into a dark area. Replace any light bulbs as soon as they burn out. Set up your furniture so you have a clear path. Avoid moving your furniture around. If any of your floors are uneven, fix them. If there are any pets around you, be aware of where they are. Review your medicines with your doctor. Some medicines can make you feel dizzy. This can increase your chance of falling. Ask your doctor what other things that you can do to help prevent falls. This information is not intended to replace advice given to you by your health care provider. Make sure you discuss any questions you have with your health care provider. Document Released: 05/26/2009 Document Revised: 01/05/2016 Document Reviewed: 09/03/2014 Elsevier Interactive Patient Education  2017 [employer].

## 2022-06-06 NOTE — Progress Notes (Signed)
Subjective:   Karen Gonzales is a 76 y.o. female who presents for Medicare Annual (Subsequent) preventive examination.   I connected with  [name] on 06/06/22 by a audio enabled telemedicine application and verified that I am speaking with the correct person using two identifiers.  Patient Location: Home  Provider Location: Home Office  I discussed the limitations of evaluation and management by telemedicine. The patient expressed understanding and agreed to proceed.  Review of Systems     Cardiac Risk Factors include: advanced age (>90mn, >>38women);diabetes mellitus;dyslipidemia;hypertension     Objective:    Today's Vitals   06/06/22 0851  Weight: 183 lb (83 kg)  Height: 5' 5"  (1.651 m)   Body mass index is 30.45 kg/m.     06/06/2022    8:55 AM 06/05/2021   11:21 AM 05/30/2020   11:21 AM 01/18/2020   11:59 AM 05/28/2019    9:38 AM 10/24/2017    2:38 PM 08/12/2017    9:36 AM  Advanced Directives  Does Patient Have a Medical Advance Directive? No No No No No No No  Would patient like information on creating a medical advance directive? No - Patient declined No - Patient declined No - Patient declined No - Patient declined Yes (MAU/Ambulatory/Procedural Areas - Information given) No - Patient declined No - Patient declined    Current Medications (verified) Outpatient Encounter Medications as of 06/06/2022  Medication Sig   amLODipine (NORVASC) 10 MG tablet TAKE 1 TABLET BY MOUTH EVERY DAY   aspirin EC 81 MG tablet Take 1 tablet (81 mg total) by mouth daily. Swallow whole.   Blood Glucose Monitoring Suppl (ONE TOUCH ULTRA 2) w/Device KIT Pt uses glucometer to check sugars twice daily. Dx E11.9   empagliflozin (JARDIANCE) 10 MG TABS tablet Take 1 tablet (10 mg total) by mouth daily.   gabapentin (NEURONTIN) 300 MG capsule TAKE 2 CAPSULES BY MOUTH 3 TIMES A DAY   glipiZIDE (GLUCOTROL XL) 5 MG 24 hr tablet Take 1 tablet (5 mg total) by mouth daily with  breakfast.   Lancets (ONETOUCH DELICA PLUS [id number] MISC USE WHEN CHECKING BLOOD SUGAR AS DIRECTED   losartan (COZAAR) 50 MG tablet TAKE 2 TABLETS BY MOUTH EVERY DAY   meloxicam (MOBIC) 15 MG tablet TAKE 1 TABLET BY MOUTH EVERY DAY WITH A MEAL   metFORMIN (GLUCOPHAGE) 500 MG tablet TAKE 2 TABLETS BY MOUTH 2 (TWO) TIMES DAILY WITH A MEAL.   omeprazole (PRILOSEC) 40 MG capsule Take 1 capsule (40 mg total) by mouth 2 (two) times daily. (Patient taking differently: Take 40 mg by mouth daily.)   ONETOUCH ULTRA test strip CHECK BLOOD SUGAR 1-2 TIMES DAILY   rosuvastatin (CRESTOR) 10 MG tablet Take 1 tablet (10 mg total) by mouth daily.   sitaGLIPtin (JANUVIA) 100 MG tablet Take 100 mg by mouth daily.   No facility-administered encounter medications on file as of 06/06/2022.    Allergies (verified) Nsaids, Reglan [metoclopramide], Estrogens, and Other   History: Past Medical History:  Diagnosis Date   Anti-cardiolipin antibody syndrome ([facility] 06/25/2011   Arthritis    Blood transfusion    had when knee was replaced   Bronchitis    history of   Chills    Complication of anesthesia    Cough    COVID-19    Diabetes mellitus    DVT (deep venous thrombosis) ([facility] 06/25/2011   Fatty liver disease, nonalcoholic    determinde by Ultrasound 11/11   GERD (gastroesophageal  reflux disease)    H/O vitamin D deficiency    Heart murmur    Heart murmur    Benign   Hiatal hernia    Hyperlipidemia    Hypertension    Hyponatremia    with diuretic use   Osteopenia    PONV (postoperative nausea and vomiting)    Vertigo    Past Surgical History:  Procedure Laterality Date   ABDOMINAL HYSTERECTOMY     ACHILLES TENDON SURGERY     bilateral    CARPAL TUNNEL RELEASE     bilateral    CHOLECYSTECTOMY  1969   open   CRANIOTOMY  07/09/2011   Procedure: CRANIOTOMY HEMATOMA EVACUATION SUBDURAL;  Surgeon: Cooper Render Pool;  Location: [facility];  Service: Neurosurgery;  Laterality: Right;  Excision  of Skull Lesion-Right Right Parietal Crani w/ Resection of Skull Tumor (1 1/2 hours - to follow)   [place]     JOINT REPLACEMENT  2004   left knee replacement    KNEE SURGERY     total of 6 surgeries on both knees    REPAIR PERONEAL TENDONS ANKLE     TUBAL LIGATION     TUMOR REMOVAL     on pelvic bone    Family History  Problem Relation Age of Onset   Kidney disease Mother    Osteoporosis Mother    Heart disease Father    Cancer Father        lymphoma   Osteoporosis Father    Healthy Brother    Cancer Maternal Grandmother        lymphoma   Breast cancer Maternal Grandmother    Cancer Paternal Grandmother        breast   Breast cancer Paternal Aunt    Hodgkin's lymphoma Grandchild    Social History   Socioeconomic History   Marital status: Married    Spouse name: Not on file   Number of children: Not on file   Years of education: Not on file   Highest education level: Not on file  Occupational History   Not on file  Tobacco Use   Smoking status: Never   Smokeless tobacco: Never  Vaping Use   Vaping Use: Never used  Substance and Sexual Activity   Alcohol use: No   Drug use: No   Sexual activity: Not Currently  Other Topics Concern   Not on file  Social History Narrative   Not on file   Social Determinants of Health   Financial Resource Strain: Low Risk  (06/06/2022)   Overall Financial Resource Strain (CARDIA)    Difficulty of Paying Living Expenses: Not hard at all  Food Insecurity: No Food Insecurity (06/06/2022)   Hunger Vital Sign    Worried About Running Out of Food in the Last Year: Never true    [place] in the Last Year: Never true  Transportation Needs: No Transportation Needs (06/06/2022)   PRAPARE - Hydrologist (Medical): No    Lack of Transportation (Non-Medical): No  Physical Activity: Insufficiently Active (06/06/2022)   Exercise Vital Sign    Days of Exercise per Week: 2 days     Minutes of Exercise per Session: 20 min  Stress: No Stress Concern Present (06/06/2022)   [place]    Feeling of Stress : Not at all  Social Connections: Moderately Integrated (06/06/2022)   Social Connection and Isolation  Panel [NHANES]    Frequency of Communication with Friends and Family: More than three times a week    Frequency of Social Gatherings with Friends and Family: More than three times a week    Attends Religious Services: More than 4 times per year    Active Member of [employer] or Organizations: No    Attends Music therapist: Never    Marital Status: Married    Tobacco Counseling Counseling given: Not Answered   Clinical Intake:  Pre-visit preparation completed: Yes  Pain : No/denies pain     Nutritional Risks: None Diabetes: No  How often do you need to have someone help you when you read instructions, pamphlets, or other written materials from your doctor or pharmacy?: 1 - Never  Diabetic?yes  Nutrition Risk Assessment:  Has the patient had any N/V/D within the last 2 months?  No  Does the patient have any non-healing wounds?  No  Has the patient had any unintentional weight loss or weight gain?  No   Diabetes:  Is the patient diabetic?  Yes  If diabetic, was a CBG obtained today?  No  Did the patient bring in their glucometer from home?  No  How often do you monitor your CBG's? Daily .   Financial Strains and Diabetes Management:  Are you having any financial strains with the device, your supplies or your medication? No .  Does the patient want to be seen by Chronic Care Management for management of their diabetes?  No  Would the patient like to be referred to a Nutritionist or for Diabetic Management?  No   Diabetic Exams:  Diabetic Eye Exam: Completed 06/2021 Diabetic Foot Exam: Overdue, Pt has been advised about the importance in completing this exam. Pt is  scheduled for diabetic foot exam on next office visit .   Interpreter Needed?: No  Information entered by :: Jadene Pierini, LPN   Activities of Daily Living    06/06/2022    8:54 AM 09/25/2021    9:56 AM  In your present state of health, do you have any difficulty performing the following activities:  Hearing? 0 0  Vision? 0 0  Difficulty concentrating or making decisions? 0 0  Walking or climbing stairs? 0 0  Dressing or bathing? 0 0  Doing errands, shopping? 0 0  Preparing Food and eating ? N   Using the Toilet? N   In the past six months, have you accidently leaked urine? N   Do you have problems with loss of bowel control? N   Managing your Medications? N   Managing your Finances? N   Housekeeping or managing your Housekeeping? N     Patient Care Team: [name], MD as PCP - General (Family Medicine) Buford Dresser, MD as PCP - Cardiology (Cardiology) Earnie Larsson, MD as Consulting Physician (Neurosurgery) Laurence Spates, MD (Inactive) as Consulting Physician (Gastroenterology) Paulla Dolly Tamala Fothergill, DPM as Consulting Physician (Podiatry) Madelin Rear, [facility] (Inactive) as Pharmacist (Pharmacist)  Indicate any recent Medical Services you may have received from other than [facility] providers in the past year (date may be approximate).     Assessment:   This is a routine wellness examination for [place].  Hearing/Vision screen Vision Screening - Comments:: Annual eye exams wears glasses   Dietary issues and exercise activities discussed: Current Exercise Habits: Home exercise routine, Type of exercise: walking, Time (Minutes): 20, Frequency (Times/Week): 2, Weekly Exercise (Minutes/Week): 40, Exercise limited by: respiratory conditions(s);cardiac condition(s)  Goals Addressed             This Visit's Progress    Patient Stated   On track    Increase activity by walking more       Depression Screen    06/06/2022    8:54 AM 04/30/2022    9:27 AM  01/01/2022    9:54 AM 09/25/2021    9:56 AM 06/05/2021   11:44 AM 05/23/2021   10:30 AM 05/05/2021    9:30 AM  PHQ 2/9 Scores  PHQ - 2 Score 0 0 0 0 0 0 0  PHQ- 9 Score 0 4 3 4  0 4 3    Fall Risk    06/06/2022    8:52 AM 04/30/2022    9:27 AM 01/01/2022    9:54 AM 09/25/2021    9:57 AM 06/05/2021   11:23 AM  Fall Risk   Falls in the past year? 0 0 0 0 0  Number falls in past yr: 0  0  0  Injury with Fall? 0  0  0  Risk for fall due to : No Fall Risks No Fall Risks  No Fall Risks   Follow up Falls prevention discussed Falls evaluation completed  Falls evaluation completed Falls evaluation completed;Falls prevention discussed    FALL RISK PREVENTION PERTAINING TO THE HOME:  Any stairs in or around the home? No  If so, are there any without handrails? No  Home free of loose throw rugs in walkways, pet beds, electrical cords, etc? Yes  Adequate lighting in your home to reduce risk of falls? Yes   ASSISTIVE DEVICES UTILIZED TO PREVENT FALLS:  Life alert? No  Use of a cane, walker or w/c? No  Grab bars in the bathroom? Yes  Shower chair or bench in shower? Yes  Elevated toilet seat or a handicapped toilet? Yes       10/24/2017    2:39 PM  MMSE - Mini Mental State Exam  Orientation to time 5  Orientation to Place 5  Registration 3  Attention/ Calculation 5  Recall 2  Language- name 2 objects 2  Language- repeat 1  Language- follow 3 step command 3  Language- read & follow direction 1  Write a sentence 1  Copy design 1  Total score 29        06/06/2022    8:55 AM 05/30/2020   11:31 AM  6CIT Screen  What Year? 0 points 0 points  What month? 0 points 0 points  What time? 0 points 0 points  Count back from 20 0 points 0 points  Months in reverse 0 points 2 points  Repeat phrase 0 points 0 points  Total Score 0 points 2 points    Immunizations Immunization History  Administered Date(s) Administered   Fluad Quad(high Dose 65+) 05/28/2019, 06/02/2020,  05/23/2021   Influenza Split 05/30/2009, 05/09/2010, 05/29/2013   Influenza, High Dose Seasonal PF 07/16/2014, 06/14/2015, 04/28/2018, 04/30/2022   Influenza,inj,Quad PF,6+ Mos 05/09/2011, 06/14/2015, 05/14/2016, 04/08/2017   Pneumococcal Conjugate-13 10/23/2013   Pneumococcal Polysaccharide-23 11/04/2014   Tdap 02/27/2010, 03/13/2011    TDAP status: Up to date  Flu Vaccine status: Up to date  Pneumococcal vaccine status: Up to date  Covid-19 vaccine status: Completed vaccines  Qualifies for Shingles Vaccine? Yes   Zostavax completed Yes   Shingrix Completed?: No.    Education has been provided regarding the importance of this vaccine. Patient has been advised to call insurance company to determine out  of pocket expense if they have not yet received this vaccine. Advised may also receive vaccine at local pharmacy or Health Dept. Verbalized acceptance and understanding.  Screening Tests Health Maintenance  Topic Date Due   TETANUS/TDAP  03/12/2021   OPHTHALMOLOGY EXAM  01/24/2022   MAMMOGRAM  07/25/2022   HEMOGLOBIN A1C  10/29/2022   Diabetic kidney evaluation - Urine ACR  01/19/2023   Diabetic kidney evaluation - GFR measurement  05/01/2023   FOOT EXAM  05/01/2023   Medicare Annual Wellness (AWV)  07/07/2023   DEXA SCAN  11/22/2023   COLONOSCOPY (Pts 45-62yr Insurance coverage will need to be confirmed)  08/23/2029   Pneumonia Vaccine 75 Years old  Completed   INFLUENZA VACCINE  Completed   Hepatitis C Screening  Completed   HPV VACCINES  Aged Out   COVID-19 Vaccine  Discontinued   Zoster Vaccines- Shingrix  Discontinued    Health Maintenance  Health Maintenance Due  Topic Date Due   TETANUS/TDAP  03/12/2021   OPHTHALMOLOGY EXAM  01/24/2022    Colorectal cancer screening: No longer required.   Mammogram status: No longer required due to age .  Bone Density status: Completed 11/21/2021. Results reflect: Bone density results: OSTEOPOROSIS. Repeat every 2  years.  Lung Cancer Screening: (Low Dose CT Chest recommended if Age 75-80years, 30 pack-year currently smoking OR have quit w/in 15years.) does not qualify.   Lung Cancer Screening Referral: n/a  Additional Screening:  Hepatitis C Screening: does not qualify;   Vision Screening: Recommended annual ophthalmology exams for early detection of glaucoma and other disorders of the eye. Is the patient up to date with their annual eye exam?  Yes  Who is the provider or what is the name of the office in which the patient attends annual eye exams? Dr.Shipiro If pt is not established with a provider, would they like to be referred to a provider to establish care? No .   Dental Screening: Recommended annual dental exams for proper oral hygiene  Community Resource Referral / Chronic Care Management: CRR required this visit?  No   CCM required this visit?  No      Plan:     I have personally reviewed and noted the following in the patient's chart:   Medical and social history Use of alcohol, tobacco or illicit drugs  Current medications and supplements including opioid prescriptions. Patient is not currently taking opioid prescriptions. Functional ability and status Nutritional status Physical activity Advanced directives List of other physicians Hospitalizations, surgeries, and ER visits in previous 12 months Vitals Screenings to include cognitive, depression, and falls Referrals and appointments  In addition, I have reviewed and discussed with patient certain preventive protocols, quality metrics, and best practice recommendations. A written personalized care plan for preventive services as well as general preventive health recommendations were provided to patient.     LDaphane Shepherd LPN   [id number]  Nurse Notes: none

## 2022-06-13 ENCOUNTER — Other Ambulatory Visit: Payer: Self-pay | Admitting: Family Medicine

## 2022-06-13 DIAGNOSIS — E1140 Type 2 diabetes mellitus with diabetic neuropathy, unspecified: Secondary | ICD-10-CM

## 2022-06-13 DIAGNOSIS — I10 Essential (primary) hypertension: Secondary | ICD-10-CM

## 2022-06-19 ENCOUNTER — Other Ambulatory Visit: Payer: Self-pay | Admitting: Family Medicine

## 2022-06-21 ENCOUNTER — Encounter: Payer: Self-pay | Admitting: Family Medicine

## 2022-06-21 DIAGNOSIS — H5203 Hypermetropia, bilateral: Secondary | ICD-10-CM | POA: Diagnosis not present

## 2022-06-21 DIAGNOSIS — H40013 Open angle with borderline findings, low risk, bilateral: Secondary | ICD-10-CM | POA: Diagnosis not present

## 2022-06-21 DIAGNOSIS — H35033 Hypertensive retinopathy, bilateral: Secondary | ICD-10-CM | POA: Diagnosis not present

## 2022-06-21 DIAGNOSIS — H524 Presbyopia: Secondary | ICD-10-CM | POA: Diagnosis not present

## 2022-06-21 DIAGNOSIS — H43393 Other vitreous opacities, bilateral: Secondary | ICD-10-CM | POA: Diagnosis not present

## 2022-06-21 DIAGNOSIS — H53143 Visual discomfort, bilateral: Secondary | ICD-10-CM | POA: Diagnosis not present

## 2022-06-21 DIAGNOSIS — H2513 Age-related nuclear cataract, bilateral: Secondary | ICD-10-CM | POA: Diagnosis not present

## 2022-06-21 DIAGNOSIS — H52223 Regular astigmatism, bilateral: Secondary | ICD-10-CM | POA: Diagnosis not present

## 2022-06-21 DIAGNOSIS — E119 Type 2 diabetes mellitus without complications: Secondary | ICD-10-CM | POA: Diagnosis not present

## 2022-06-21 LAB — HM DIABETES EYE EXAM

## 2022-06-29 ENCOUNTER — Ambulatory Visit (INDEPENDENT_AMBULATORY_CARE_PROVIDER_SITE_OTHER): Payer: PPO

## 2022-06-29 DIAGNOSIS — I350 Nonrheumatic aortic (valve) stenosis: Secondary | ICD-10-CM | POA: Diagnosis not present

## 2022-06-29 LAB — ECHOCARDIOGRAM COMPLETE
AR max vel: 0.87 cm2
AV Area VTI: 0.69 cm2
AV Area mean vel: 0.8 cm2
AV Mean grad: 33 mmHg
AV Peak grad: 57.8 mmHg
Ao pk vel: 3.8 m/s
Area-P 1/2: 3.48 cm2
P 1/2 time: 419 msec
S' Lateral: 2.72 cm

## 2022-07-11 ENCOUNTER — Encounter (HOSPITAL_BASED_OUTPATIENT_CLINIC_OR_DEPARTMENT_OTHER): Payer: Self-pay | Admitting: Cardiology

## 2022-07-11 ENCOUNTER — Ambulatory Visit (HOSPITAL_BASED_OUTPATIENT_CLINIC_OR_DEPARTMENT_OTHER): Payer: PPO | Admitting: Cardiology

## 2022-07-11 VITALS — BP 140/64 | HR 86 | Ht 65.0 in | Wt 185.0 lb

## 2022-07-11 DIAGNOSIS — E782 Mixed hyperlipidemia: Secondary | ICD-10-CM

## 2022-07-11 DIAGNOSIS — I10 Essential (primary) hypertension: Secondary | ICD-10-CM

## 2022-07-11 DIAGNOSIS — Z712 Person consulting for explanation of examination or test findings: Secondary | ICD-10-CM | POA: Diagnosis not present

## 2022-07-11 DIAGNOSIS — I350 Nonrheumatic aortic (valve) stenosis: Secondary | ICD-10-CM

## 2022-07-11 NOTE — Patient Instructions (Addendum)
Medication Instructions:  Your physician recommends that you continue on your current medications as directed. Please refer to the Current Medication list given to you today.  *If you need a refill on your cardiac medications before your next appointment, please call your pharmacy*   Follow-Up: At [facility], you and your health needs are our priority.  As part of our continuing mission to provide you with exceptional heart care, we have created designated Provider Care Teams.  These Care Teams include your primary Cardiologist (physician) and Advanced Practice Providers (APPs -  Physician Assistants and Nurse Practitioners) who all work together to provide you with the care you need, when you need it.  We recommend signing up for the patient portal called "MyChart".  Sign up information is provided on this After Visit Summary.  MyChart is used to connect with patients for Virtual Visits (Telemedicine).  Patients are able to view lab/test results, encounter notes, upcoming appointments, etc.  Non-urgent messages can be sent to your provider as well.   To learn more about what you can do with MyChart, go to [email or web address].    Your next appointment:   6 month(s)  The format for your next appointment:   In Person  Provider:   Buford Dresser, MD    Other Instructions You have been referred to our [facility]. They will call you to schedule an appointment.

## 2022-07-11 NOTE — Progress Notes (Signed)
Cardiology Office Note:    Date:  07/11/2022   ID:  Karen Gonzales, DOB 09-06-1945, MRN [id number]  PCP:  [name], MD  Cardiologist:  Buford Dresser, MD  Referring MD: [name], MD   CC: follow up   History of Present Illness:    Karen Gonzales is a 76 y.o. female with a hx of hypertension, hyperlipidemia, diabetes, DVT, GERD, hyponatremia (with diuretic use), fatty liver disease (nonalcoholic), anti-cardiolipin antibody syndrome, arthritis, and COVID-19, who is seen for follow up today. I initially met her 07/17/21 as a new consult at the request of [name], MD for the evaluation and management of aortic valve stenosis.  At her last visit, results of her echo were discussed. She had some back/shoulder pain, better when she is in the water. Continued to be generally fatigued, no change. She had an echo 06/29/2022 which showed LVEF 65-70% with Grade I diastolic dysfunction.  Today, she is accompanied by a family member.  She has experienced light chest "heaviness" in her central chest. She also experiences exertional SOB Her SOB also occurs if walking for long periods, such as walking around a store.  Lately she has been struggling with LE issues. In the mornings she will wake up and notice that her right foot feels like "it wants to lock up." No associated edema. She also complains of worsening LE muscle cramps, especially at night. Other chronic issues include restless legs syndrome and neuropathy.  Additionally she endorses lightheadedness upon bending down, and with other positional changes.  She attributes her elevated BP reading in clinic today (164/70) to recent stressful life events.  She also has a side effect of dry mouth due to taking medication.  She denies any palpitations, or peripheral edema. No headaches, syncope, orthopnea, or PND.   Past Medical History:  Diagnosis Date   Anti-cardiolipin antibody syndrome ([place]) 06/25/2011    Arthritis    Blood transfusion    had when knee was replaced   Bronchitis    history of   Chills    Complication of anesthesia    Cough    COVID-19    Diabetes mellitus    DVT (deep venous thrombosis) ([place]) 06/25/2011   Fatty liver disease, nonalcoholic    determinde by Ultrasound 11/11   GERD (gastroesophageal reflux disease)    H/O vitamin D deficiency    Heart murmur    Heart murmur    Benign   Hiatal hernia    Hyperlipidemia    Hypertension    Hyponatremia    with diuretic use   Osteopenia    PONV (postoperative nausea and vomiting)    Vertigo     Past Surgical History:  Procedure Laterality Date   ABDOMINAL HYSTERECTOMY     ACHILLES TENDON SURGERY     bilateral    CARPAL TUNNEL RELEASE     bilateral    CHOLECYSTECTOMY  1969   open   CRANIOTOMY  07/09/2011   Procedure: CRANIOTOMY HEMATOMA EVACUATION SUBDURAL;  Surgeon: Cooper Render Pool;  Location: [facility];  Service: Neurosurgery;  Laterality: Right;  Excision of Skull Lesion-Right Right Parietal Crani w/ Resection of Skull Tumor (1 1/2 hours - to follow)   Sargeant REPLACEMENT  2004   left knee replacement    KNEE SURGERY     total of 6 surgeries on both knees    REPAIR PERONEAL TENDONS ANKLE  TUBAL LIGATION     TUMOR REMOVAL     on pelvic bone     Current Medications: Current Outpatient Medications on File Prior to Visit  Medication Sig   amLODipine (NORVASC) 10 MG tablet TAKE 1 TABLET BY MOUTH EVERY DAY   aspirin EC 81 MG tablet Take 1 tablet (81 mg total) by mouth daily. Swallow whole.   Blood Glucose Monitoring Suppl (ONE TOUCH ULTRA 2) w/Device KIT Pt uses glucometer to check sugars twice daily. Dx E11.9   empagliflozin (JARDIANCE) 10 MG TABS tablet Take 1 tablet (10 mg total) by mouth daily.   gabapentin (NEURONTIN) 300 MG capsule TAKE 2 CAPSULES BY MOUTH 3 TIMES A DAY   glipiZIDE (GLUCOTROL XL) 5 MG 24 hr tablet Take 1 tablet (5 mg total) by mouth daily  with breakfast.   Lancets (ONETOUCH DELICA PLUS [id number]) MISC USE WHEN CHECKING BLOOD SUGAR AS DIRECTED   losartan (COZAAR) 50 MG tablet TAKE 2 TABLETS BY MOUTH EVERY DAY   meloxicam (MOBIC) 15 MG tablet TAKE 1 TABLET BY MOUTH EVERY DAY WITH A MEAL   metFORMIN (GLUCOPHAGE) 500 MG tablet TAKE 2 TABLETS BY MOUTH 2 TIMES DAILY WITH A MEAL.   omeprazole (PRILOSEC) 40 MG capsule Take 1 capsule (40 mg total) by mouth 2 (two) times daily. (Patient taking differently: Take 40 mg by mouth daily.)   ONETOUCH ULTRA test strip CHECK BLOOD SUGAR 1-2 TIMES DAILY   rosuvastatin (CRESTOR) 10 MG tablet Take 1 tablet (10 mg total) by mouth daily.   sitaGLIPtin (JANUVIA) 100 MG tablet Take 100 mg by mouth daily.   Vitamin D, Ergocalciferol, (DRISDOL) 1.25 MG (50000 UNIT) CAPS capsule TAKE 1 CAPSULE (50,000 UNITS TOTAL) BY MOUTH EVERY 7 (SEVEN) DAYS   No current facility-administered medications on file prior to visit.     Allergies:   Nsaids, Reglan [metoclopramide], Estrogens, and Other   Social History   Tobacco Use   Smoking status: Never   Smokeless tobacco: Never  Vaping Use   Vaping Use: Never used  Substance Use Topics   Alcohol use: No   Drug use: No    Family History: family history includes Breast cancer in her maternal grandmother and paternal aunt; Cancer in her father, maternal grandmother, and paternal grandmother; Healthy in her brother; Heart disease in her father; Hodgkin's lymphoma in her grandchild; Kidney disease in her mother; Osteoporosis in her father and mother. "Whole family" has diabetes. Father died of heart failure at 47 yo. Mother died at 42 yo due to kidney failure. Two brothers, one died from diabetes, and one living with diabetes and other health issues.  ROS:   Please see the history of present illness.   (+) Chest heaviness (+) exertional SOB (+) lightheadedness (+) BLE cramps (+) dry mouth Additional pertinent ROS otherwise unremarkable.  EKGs/Labs/Other  Studies Reviewed:    The following studies were reviewed today:  ECHO 06/29/2022:  IMPRESSIONS     1. Left ventricular ejection fraction, by estimation, is 65 to 70%. The  left ventricle has normal function. The left ventricle has no regional  wall motion abnormalities. There is mild concentric left ventricular  hypertrophy. Left ventricular diastolic  parameters are consistent with Grade I diastolic dysfunction (impaired  relaxation).   2. Right ventricular systolic function is normal. The right ventricular  size is normal. Tricuspid regurgitation signal is inadequate for assessing  PA pressure.   3. Left atrial size was moderately dilated.   4. The mitral valve is normal in  structure. No evidence of mitral valve  regurgitation.   5. The aortic valve is tricuspid. There is severe calcifcation of the  aortic valve. There is severe thickening of the aortic valve. Aortic valve  regurgitation is trivial. Severe aortic valve stenosis.   6. The inferior vena cava is normal in size with greater than 50%  respiratory variability, suggesting right atrial pressure of 3 mmHg.   Comparison(s): Prior images reviewed side by side. Aortic stenosis is now  severe. Although gradiients are similar to the previous study, this is in  the setting of a lower stroke volume. Dimensionless valve index is now  0.24.   Echo 12/15/21:  1. Left ventricular ejection fraction, by estimation, is 60 to 65%. Left  ventricular ejection fraction by 3D volume is 60 %. The left ventricle has  normal function. The left ventricle has no regional wall motion abnormalities. Left ventricular diastolic  parameters are consistent with Grade I diastolic dysfunction (impaired relaxation).   2. Normal RV free wall strain index -29.9%. Right ventricular systolic  function is normal. The right ventricular size is normal. Tricuspid  regurgitation signal is inadequate for assessing PA pressure.   3. Left atrial size was severely  dilated.   4. The mitral valve is abnormal. Trivial mitral valve regurgitation.   5. The aortic valve is tricuspid. There is moderate calcification of the  aortic valve. Aortic valve regurgitation is mild. Moderate to severe  aortic valve stenosis. Aortic regurgitation PHT measures 363 msec. Aortic  valve area, by VTI measures 0.97 cm.  Aortic valve mean gradient measures 33.0 mmHg. Aortic valve Vmax measures 3.75 m/s. DI is 0.34.  Echo 06/06/2021:  1. Left ventricular ejection fraction, by estimation, is 55 to 60%. The  left ventricle has normal function. The left ventricle has no regional  wall motion abnormalities. There is mild asymmetric left ventricular  hypertrophy of the posterior segment.  Left ventricular diastolic parameters are consistent with Grade I  diastolic dysfunction (impaired relaxation).   2. Right ventricular systolic function is normal. The right ventricular  size is normal.   3. Left atrial size was moderately dilated.   4. The mitral valve is normal in structure. No evidence of mitral valve  regurgitation. No evidence of mitral stenosis.   5. The aortic valve is calcified. Aortic valve regurgitation is not  visualized. Moderate to severe aortic valve stenosis (Susipicion for  Paradoxical Low flow low gradient aortic stenosis). Aortic valve area, by  VTI measures 0.67 cm. Aortic valve mean  gradient measures 25.7 mmHg. Aortic valve Vmax measures 3.35 m/s, Indexed  AVA 0.35 cmsq/msq, SVI 26, DI 0.26.   6. The inferior vena cava is normal in size with greater than 50%  respiratory variability, suggesting right atrial pressure of 3 mmHg.   Comparison(s): A prior study was performed on 11/04/2017. Compared to prior study, Aortic stenosis have worsen. In 2019 there was mild AS with mean gradient 14 mmHg peak gradient 26 mmHg.   Left LE Venous Reflux 10/09/2019: Summary:  Left:  - No evidence of deep vein thrombosis seen in the left lower extremity.  - Venous  reflux is noted in the deep venous system.  - Venous reflux is noted in the great saphenous vein.  - There is a tortuous anterior saphenous vein branch observed with several  varicosities.  - There is a minimal amount of chronic thrombous observed in the proximal  small saphenous vein, no reflux noted.   EKG:  EKG is personally reviewed.  07/11/2022: NSR at 86 bpm 09/12/21 NSR at 88 bpm 07/17/2021: NSR at 79 bpm  Recent Labs: 01/18/2022: Magnesium 1.7 04/30/2022: ALT 30; BUN 17; Creatinine, Ser 0.82; Hemoglobin 11.3; Platelets 182.0; Potassium 4.3; Sodium 137; TSH 1.99   Recent Lipid Panel    Component Value Date/Time   CHOL 111 04/30/2022 0951   TRIG 146.0 04/30/2022 0951   HDL 48.00 04/30/2022 0951   CHOLHDL 2 04/30/2022 0951   VLDL 29.2 04/30/2022 0951   LDLCALC 34 04/30/2022 0951   LDLCALC 75 10/24/2017 1559   LDLDIRECT 104.0 04/08/2017 1125    Physical Exam:    VS:  BP (!) 140/64   Pulse 86   Ht 5' 5"  (1.651 m)   Wt 185 lb (83.9 kg)   BMI 30.79 kg/m     Wt Readings from Last 3 Encounters:  07/11/22 185 lb (83.9 kg)  06/06/22 183 lb (83 kg)  04/30/22 186 lb (84.4 kg)    GEN: Well nourished, well developed in no acute distress HEENT: Normal, moist mucous membranes NECK: No JVD CARDIAC: regular rhythm, normal S1 and S2, no rubs or gallops. 3/6 early to mid peaking systolic ejection murmur with soft S2 VASCULAR: Radial and DP pulses 2+ bilaterally. No carotid bruits RESPIRATORY:  Clear to auscultation without rales, wheezing or rhonchi  ABDOMEN: Soft, non-tender, non-distended MUSCULOSKELETAL:  Ambulates independently SKIN: Warm and dry, no edema NEUROLOGIC:  Alert and oriented x 3. No focal neuro deficits noted. PSYCHIATRIC:  Normal affect    ASSESSMENT:    1. Nonrheumatic aortic valve stenosis   2. Encounter to discuss test results   3. Essential hypertension   4. Mixed hyperlipidemia     PLAN:    Aortic stenosis -reviewed symptoms, echo -will refer  to TAVR team -counseled on red flag warning signs that need immediate medical attention  Hypertension: -improved on recheck -discussed home monitoring today,  does infrequently -continue amlodipine, losartan  Type II diabetes Mixed hyperlipidemia -on empagliflozin, glipizide, metformin, sitagliptin -continue rosuvastatin 10 mg, LDL 34 -continue aspirin 81 mg daily  Cardiac risk counseling and prevention recommendations: -recommend heart healthy/Mediterranean diet, with whole grains, fruits, vegetable, fish, lean meats, nuts, and olive oil. Limit salt. -recommend moderate walking, 3-5 times/week for 30-50 minutes each session. Aim for at least 150 minutes.week. Goal should be pace of 3 miles/hours, or walking 1.5 miles in 30 minutes -recommend avoidance of tobacco products. Avoid excess alcohol. -ASCVD risk score: The ASCVD Risk score (Arnett DK, et al., 2019) failed to calculate for the following reasons:   The valid total cholesterol range is 130 to 320 mg/dL    Plan for follow up: 6 months or sooner as needed  Buford Dresser, MD, PhD, [facility]    Medication Adjustments/Labs and Tests Ordered: Current medicines are reviewed at length with the patient today.  Concerns regarding medicines are outlined above.   Orders Placed This Encounter  Procedures   Ambulatory referral to Structural Heart[facility]Valve Clinic (only at [facility])   EKG 12-Lead    No orders of the defined types were placed in this encounter.   Patient Instructions  Medication Instructions:  Your physician recommends that you continue on your current medications as directed. Please refer to the Current Medication list given to you today.  *If you need a refill on your cardiac medications before your next appointment, please call your pharmacy*   Follow-Up: At [facility], you and your health needs are our priority.  As part  of our continuing mission to provide you with  exceptional heart care, we have created designated Provider Care Teams.  These Care Teams include your primary Cardiologist (physician) and Advanced Practice Providers (APPs -  Physician Assistants and Nurse Practitioners) who all work together to provide you with the care you need, when you need it.  We recommend signing up for the patient portal called "MyChart".  Sign up information is provided on this After Visit Summary.  MyChart is used to connect with patients for Virtual Visits (Telemedicine).  Patients are able to view lab/test results, encounter notes, upcoming appointments, etc.  Non-urgent messages can be sent to your provider as well.   To learn more about what you can do with MyChart, go to [email or web address].    Your next appointment:   6 month(s)  The format for your next appointment:   In Person  Provider:   Buford Dresser, MD    Other Instructions You have been referred to our [facility]. They will call you to schedule an appointment.           I,Mitra Faeizi,acting as a Education administrator for [employer], MD.,have documented all relevant documentation on the behalf of Buford Dresser, MD,as directed by  Buford Dresser, MD while in the presence of Buford Dresser, MD.   I, Buford Dresser, MD, have reviewed all documentation for this visit. The documentation on 09/16/22 for the exam, diagnosis, procedures, and orders are all accurate and complete.   Signed, Buford Dresser, MD PhD 07/11/2022   [place]

## 2022-07-13 ENCOUNTER — Encounter: Payer: Self-pay | Admitting: Cardiovascular Disease

## 2022-07-13 ENCOUNTER — Ambulatory Visit: Payer: PPO | Attending: Cardiovascular Disease | Admitting: Cardiovascular Disease

## 2022-07-13 VITALS — BP 150/70 | HR 80 | Ht 64.75 in | Wt 183.0 lb

## 2022-07-13 DIAGNOSIS — Z01810 Encounter for preprocedural cardiovascular examination: Secondary | ICD-10-CM | POA: Diagnosis not present

## 2022-07-13 DIAGNOSIS — I350 Nonrheumatic aortic (valve) stenosis: Secondary | ICD-10-CM | POA: Diagnosis not present

## 2022-07-13 NOTE — H&P (View-Only) (Signed)
Cardiology Office Note:    Date:  07/14/2022   ID:  Karen Gonzales, DOB 07/15/1946, MRN [id number]  PCP:  [name], MD   [facility] Prov[facility] Cardiologist:  Buford Dresser, MD     Referring MD: [name], MD   Chief Complaint  Patient presents with   Aortic Stenosis    History of Present Illness:    Karen Gonzales is a 76 y.o. female referred by Dr Harrell Gave for evaluation of aortic stenosis.  The patient has a history of essential hypertension, mixed hyperlipidemia, type 2 diabetes, nonalcoholic steatohepatitis, anticardiolipin antibody syndrome, and progressive aortic stenosis.  The patient was diagnosed with moderate aortic stenosis earlier this year.  She was seen by Dr. Harrell Gave and a 39-monthfollow-up echocardiogram was recommended.  Recent echo study shows progressive aortic stenosis, now in the severe range.  The patient was referred to discuss treatment options.  She is here with her husband and daughter today. She has a longstanding heart murmur that became more prominent in the past few years. She was referred for an echocardiogram in May 2023 and this demonstrated moderate aortic stenosis. As above, a 6 month follow-up echo was performed 06/29/2022 and shows normal LVEF of 65%, calcified and restricted opening of the aortic valve, with peak and mean gradients of 58 and 33 mmHg, respectively. The DI is 0.24, SVI 33, and calculated AVA is 0.7 square cm, all consistent with severe stage D3 paradoxical low flow low gradient AS.   From a symptomatic perspective, she complains of progressive exertional dyspnea over the past 6 months, now limited by dyspnea with walking and light housework. She also complains of occasional chest discomfort, lightheadedness, and she has experienced worsening fatigue. No syncope. She has had regular dental care and is having a tooth pulled in the near future. She has been considering a low partial.   Past  Medical History:  Diagnosis Date   Anti-cardiolipin antibody syndrome ([facility] 06/25/2011   Arthritis    Blood transfusion    had when knee was replaced   Bronchitis    history of   Chills    Complication of anesthesia    Cough    COVID-19    Diabetes mellitus    DV[place]deep venous thrombosis) [place] 06/[place]   Fatty liver disease, nonalcoholic    determinde by Ultrasound 11/11   GERD (gastroesophageal reflux disease)    H/O vitamin D deficiency    Heart murmur    Heart murmur    Benign   Hiatal hernia    Hyperlipidemia    Hypertension    Hyponatremia    with diuretic use   Osteopenia    PONV (postoperative nausea and vomiting)    Vertigo     Past Surgical History:  Procedure Laterality Date   ABDOMINAL HYSTERECTOMY     ACHILLES TENDON SURGERY     bilateral    CARPAL TUNNEL RELEASE     bilateral    CHOLECYSTECTOMY  1969   open   CRANIOTOMY  07/09/2011   Procedure: CRANIOTOMY HEMATOMA EVACUATION SUBDURAL;  Surgeon: HCooper RenderPool;  [facility];  Service: Neurosurgery;  Laterality: Right;  Excision of Skull Lesion-Right Right Parietal Crani w/ Resection of Skull Tumor (1 1/2 hours - to follow)   [place]   left [place] replacement    KNEE SURGERY     total of 6 surgeries on both  knees    REPAIR PERONEAL TENDONS ANKLE     TUBAL LIGATION     TUMOR REMOVAL     on pelvic bone     Current Medications: Current Meds  Medication Sig   amLODipine (NORVASC) 10 MG tablet TAKE 1 TABLET BY MOUTH EVERY DAY   aspirin EC 81 MG tablet Take 1 tablet (81 mg total) by mouth daily. Swallow whole.   Blood Glucose Monitoring Suppl (ONE TOUCH ULTRA 2) w/Device KIT Pt uses glucometer to check sugars twice daily. Dx E11.9   empagliflozin (JARDIANCE) 10 MG TABS tablet Take 1 tablet (10 mg total) by mouth daily.   gabapentin (NEURONTIN) 300 MG capsule TAKE 2 CAPSULES BY MOUTH 3 TIMES A DAY   glipiZIDE (GLUCOTROL XL) 5 MG 24 hr  tablet Take 1 tablet (5 mg total) by mouth daily with breakfast.   Lancets (ONETOUCH DELICA PLUS [id number]) MISC USE WHEN CHECKING BLOOD SUGAR AS DIRECTED   losartan (COZAAR) 50 MG tablet TAKE 2 TABLETS BY MOUTH EVERY DAY   meloxicam (MOBIC) 15 MG tablet TAKE 1 TABLET BY MOUTH EVERY DAY WITH A MEAL   metFORMIN (GLUCOPHAGE) 500 MG tablet TAKE 2 TABLETS BY MOUTH 2 TIMES DAILY WITH A MEAL.   omeprazole (PRILOSEC) 40 MG capsule Take 1 capsule (40 mg total) by mouth 2 (two) times daily. (Patient taking differently: Take 40 mg by mouth daily.)   ONETOUCH ULTRA test strip CHECK BLOOD SUGAR 1-2 TIMES DAILY   rosuvastatin (CRESTOR) 10 MG tablet Take 1 tablet (10 mg total) by mouth daily.   sitaGLIPtin (JANUVIA) 100 MG tablet Take 100 mg by mouth daily.   [DISCONTINUED] Vitamin D, Ergocalciferol, (DRISDOL) 1.25 MG (50000 UNIT) CAPS capsule TAKE 1 CAPSULE (50,000 UNITS TOTAL) BY MOUTH EVERY 7 (SEVEN) DAYS     Allergies:   Nsaids, Reglan [metoclopramide], Estrogens, and Other   Social History   Socioeconomic History   Marital status: Married    Spouse name: Not on file   Number of children: Not on file   Years of education: Not on file   Highest education level: Not on file  Occupational History   Not on file  Tobacco Use   Smoking status: Never   Smokeless tobacco: Never  Vaping Use   Vaping Use: Never used  Substance and Sexual Activity   Alcohol use: No   Drug use: No   Sexual activity: Not Currently  Other Topics Concern   Not on file  Social History Narrative   Not on file   Social Determinants of Health   Financial Resource Strain: Low Risk  (06/06/2022)   Overall Financial Resource Strain (CARDIA)    Difficulty of Paying Living Expenses: Not hard at all  Food Insecurity: No Food Insecurity (06/06/2022)   Hunger Vital Sign    Worried About Running Out of Food in the Last Year: Never true    Ran Out of Food in the Last Year: Never true  Transportation Needs: No Transportation  Needs (06/06/2022)   PRAPARE - Hydrologist (Medical): No    Lack of Transportation (Non-Medical): No  Physical Activity: Insufficiently Active (06/06/2022)   Exercise Vital Sign    Days of Exercise per Week: 2 days    Minutes of Exercise per Session: 20 min  Stress: No Stress Concern Present (06/06/2022)   [place]    Feeling of Stress : Not at all  Social Connections: Moderately Integrated (06/06/2022)  Social Licensed conveyancer [NHANES]    Frequency of Communication with Friends and Family: More than three times a week    Frequency of Social Gatherings with Friends and Family: More than three times a week    Attends Religious Services: More than 4 times per year    Active Member of [employer] or Organizations: No    Attends Archivist Meetings: Never    Marital Status: Married     Family History: The patient's family history includes Breast cancer in her maternal grandmother and paternal aunt; Cancer in her father, maternal grandmother, and paternal grandmother; Healthy in her brother; Heart disease in her father; Hodgkin's lymphoma in her grandchild; Kidney disease in her mother; Osteoporosis in her father and mother.  ROS:   Please see the history of present illness.    All other systems reviewed and are negative.  EKGs/Labs/Other Studies Reviewed:    The following studies were reviewed today: Echo 06/29/2022: IMPRESSIONS     1. Left ventricular ejection fraction, by estimation, is 65 to 70%. The  left ventricle has normal function. The left ventricle has no regional  wall motion abnormalities. There is mild concentric left ventricular  hypertrophy. Left ventricular diastolic  parameters are consistent with Grade I diastolic dysfunction (impaired  relaxation).   2. Right ventricular systolic function is normal. The right ventricular  size is normal.  Tricuspid regurgitation signal is inadequate for assessing  PA pressure.   3. Left atrial size was moderately dilated.   4. The mitral valve is normal in structure. No evidence of mitral valve  regurgitation.   5. The aortic valve is tricuspid. There is severe calcifcation of the  aortic valve. There is severe thickening of the aortic valve. Aortic valve  regurgitation is trivial. Severe aortic valve stenosis.   6. The inferior vena cava is normal in size with greater than 50%  respiratory variability, suggesting right atrial pressure of 3 mmHg.   Comparison(s): Prior images reviewed side by side. Aortic stenosis is now  severe. Although gradiients are similar to the previous study, this is in  the setting of a lower stroke volume. Dimensionless valve index is now  0.24.   LEFT VENTRICLE  PLAX 2D  LVIDd:         3.71 cm   Diastology  LVIDs:         2.72 cm   LV e' medial:    5.77 cm/s  LV PW:         1.38 cm   LV E/e' medial:  10.6  LV IVS:        1.40 cm   LV e' lateral:   10.10 cm/s  LVOT diam:     1.90 cm   LV E/e' lateral: 6.0  LV SV:         63  LV SV Index:   33  LVOT Area:     2.84 cm                             3D Volume EF:                           3D EF:        56 %                           LV EDV:  126 ml                           LV ESV:       55 ml                           LV SV:        71 ml   RIGHT VENTRICLE  RV Basal diam:  3.15 cm  RV Mid diam:    2.19 cm  RV S prime:     13.20 cm/s  TAPSE (M-mode): 2.3 cm   LEFT ATRIUM             Index        RIGHT ATRIUM           Index  LA diam:        4.40 cm 2.31 cm/m   RA Area:     12.30 cm  LA Vol (A2C):   75.2 ml 39.48 ml/m  RA Volume:   27.40 ml  14.38 ml/m  LA Vol (A4C):   79.8 ml 41.89 ml/m  LA Biplane Vol: 78.5 ml 41.21 ml/m   AORTIC VALVE  AV Area (Vmax):    0.87 cm  AV Area (Vmean):   0.80 cm  AV Area (VTI):     0.69 cm  AV Vmax:           380.00 cm/s  AV Vmean:          268.000 cm/s   AV VTI:            0.920 m  AV Peak Grad:      57.8 mmHg  AV Mean Grad:      33.0 mmHg  LVOT Vmax:         116.00 cm/s  LVOT Vmean:        75.200 cm/s  LVOT VTI:          0.223 m  LVOT/AV VTI ratio: 0.24  AI PHT:            419 msec    AORTA  Ao Asc diam: 3.20 cm   MITRAL VALVE  MV Area (PHT): 3.48 cm    SHUNTS  MV Decel Time: 218 msec    Systemic VTI:  0.22 m  MV E velocity: 60.90 cm/s  Systemic Diam: 1.90 cm  MV A velocity: 92.00 cm/s  MV E/A ratio:  0.66   EKG:  EKG from 09/12/2021 is reviewed and shows NSR 88 bpm, no significant conduction abnormality  Recent Labs: 01/18/2022: Magnesium 1.7 04/30/2022: ALT 30; BUN 17; Creatinine, Ser 0.82; Hemoglobin 11.3; Platelets 182.0; Potassium 4.3; Sodium 137; TSH 1.99  Recent Lipid Panel    Component Value Date/Time   CHOL 111 04/30/2022 0951   TRIG 146.0 04/30/2022 0951   HDL 48.00 04/30/2022 0951   CHOLHDL 2 04/30/2022 0951   VLDL 29.2 04/30/2022 0951   LDLCALC 34 04/30/2022 0951   LDLCALC 75 10/24/2017 1559   LDLDIRECT 104.0 04/08/2017 1125     Risk Assessment/Calculations:            Physical Exam:    VS:  BP (!) 150/70   Pulse 80   Ht 5' 4.75" (1.645 m)   Wt 183 lb (83 kg)   SpO2 96%   BMI 30.69 kg/m     Wt Readings from Last 3 Encounters:  07/13/22 183 lb (83 kg)  07/11/22 185 lb (83.9 kg)  06/06/22 183 lb (83 kg)     GEN:  Well nourished, well developed in no acute distress HEENT: Normal NECK: No JVD; No carotid bruits LYMPHATICS: No lymphadenopathy CARDIAC: RRR, 3/6 mid-peaking harsh systolic murmur with an absent A2 RESPIRATORY:  Clear to auscultation without rales, wheezing or rhonchi  ABDOMEN: Soft, non-tender, non-distended MUSCULOSKELETAL:  No edema; No deformity  SKIN: Warm and dry NEUROLOGIC:  Alert and oriented x 3 PSYCHIATRIC:  Normal affect   ASSESSMENT:    1. Nonrheumatic aortic valve stenosis   2. Pre-procedural cardiovascular examination    PLAN:    In order of problems  listed above:  The patient has severe, Stage D3, paradoxical low flow low gradient AS. Echo findings outlined above with LVEF 65-70%, low SVI of 33, dimensionless index of 0.24, AVA 0.7 square cm, and mean gradient 33 mmHg. Exam is consistent with severe AS with absent A2. Progressive symptoms now NYHA functional class 3 with exertional dyspnea and fatigue. I have reviewed the natural history of aortic stenosis with the patient and their family members (husband and daughter) who are present today. We have discussed the limitations of medical therapy and the poor prognosis associated with symptomatic aortic stenosis. We have reviewed potential treatment options, including palliative medical therapy, conventional surgical aortic valve replacement, and transcatheter aortic valve replacement. We discussed treatment options in the context of the patient's specific comorbid medical conditions.  She understands that further evaluation is necessary to determine the best treatment option. She will undergo R/L heart catheterization to evaluate for obstructive CAD and assess hemodynamics. I have reviewed the risks, indications, and alternatives to cardiac catheterization, possible angioplasty, and stenting with the patient. Risks include but are not limited to bleeding, infection, vascular injury, stroke, myocardial infection, arrhythmia, kidney injury, radiation-related injury in the case of prolonged fluoroscopy use, emergency cardiac surgery, and death. The patient understands the risks of serious complication is 1-2 in 4287 with diagnostic cardiac cath and 1-2% or less with angioplasty/stenting.  She will undergo CTA studies of the heart and the chest/abdomen/pelvis to assess cardiac anatomy and vascular access as it pertains to TAVR. Once her preop studies are completed, she will be referred for formal surgical consultation as part of a multidisciplinary evaluation.       Shared Decision Making/Informed Consent The  risks [stroke (1 in 1000), death (1 in 1000), kidney failure [usually temporary] (1 in 500), bleeding (1 in 200), allergic reaction [possibly serious] (1 in 200)], benefits (diagnostic support and management of coronary artery disease) and alternatives of a cardiac catheterization were discussed in detail with Ms. Doffing and she is willing to proceed.    Medication Adjustments/Labs and Tests Ordered: Current medicines are reviewed at length with the patient today.  Concerns regarding medicines are outlined above.  Orders Placed This Encounter  Procedures   CBC   Basic metabolic panel   No orders of the defined types were placed in this encounter.   Patient Instructions  Medication Instructions:  Your physician recommends that you continue on your current medications as directed. Please refer to the Current Medication list given to you today.  *If you need a refill on your cardiac medications before your next appointment, please call your pharmacy*   Lab Work: CBC, BMET If you have labs (blood work) drawn today and your tests are completely normal, you will receive your results only by: [place] (if you have MyChart) OR A paper copy in the mail If  you have any lab test that is abnormal or we need to change your treatment, we will call you to review the results.   Testing/Procedures: R & L Heart catheterization Your physician has requested that you have a cardiac catheterization. Cardiac catheterization is used to diagnose and/or treat various heart conditions. Doctors may recommend this procedure for a number of different reasons. The most common reason is to evaluate chest pain. Chest pain can be a symptom of coronary artery disease (CAD), and cardiac catheterization can show whether plaque is narrowing or blocking your heart's arteries. This procedure is also used to evaluate the valves, as well as measure the blood flow and oxygen levels in different parts of your heart. For  further information please visit [email or web address]. Please follow instruction sheet, as given.  Referral to TCTS  *you will be called to schedule  TAVR CT scans   *you will be called to schedule  Follow-Up: At [facility], you and your health needs are our priority.  As part of our continuing mission to provide you with exceptional heart care, we have created designated Provider Care Teams.  These Care Teams include your primary Cardiologist (physician) and Advanced Practice Providers (APPs -  Physician Assistants and Nurse Practitioners) who all work together to provide you with the care you need, when you need it.  Your next appointment:   Structural team will follow-up  The format for your next appointment:   In Person  Provider:   Sherren Mocha, MD  Other Instructions       Cardiac/Peripheral Catheterization   You are scheduled for a Cardiac Catheterization on Thursday, December 7 with Dr. Lauree Chandler.  1. Please arrive at the Main Entrance A at [facility]: [place], [place] [id number] on December 7 at 5:30 AM (This time is two hours before your procedure to ensure your preparation). Free valet parking service is available. You will check in at ADMITTING. The support person will be asked to wait in the waiting room.  It is OK to have someone drop you off and come back when you are ready to be discharged.        Special note: Every effort is made to have your procedure done on time. Please understand that emergencies sometimes delay scheduled procedures.   . 2. Diet: Do not eat solid foods after midnight.  You may have clear liquids until 5 AM the day of the procedure.  3. Labs: You will need to have blood drawn on Monday, December 4 at [facility] at [facility]. [address]  Open: 7:30am - 5pm    Phone: [phone]. You do not need to be fasting.  4. Medication instructions in preparation for  your procedure:   Contrast Allergy: No  DO NOT TAKE Glipizide, Jardiance, or Januvia the day of procedure DO NOT TAKE Metformin the day of and skip for 2 days after procedure  On the morning of your procedure, take Aspirin 81 mg and any morning medicines NOT listed above.  You may use sips of water.  5. Plan to go home the same day, you will only stay overnight if medically necessary. 6. You MUST have a responsible adult to drive you home. 7. An adult MUST be with you the first 24 hours after you arrive home. 8. Bring a current list of your medications, and the last time and date medication taken. 9. Bring ID and current insurance cards. 10.Please  wear clothes that are easy to get on and off and wear slip-on shoes.  Thank you for allowing [place] to care for you!   -- [facility]   Important Information About Sugar         Signed, Sherren Mocha, MD  07/14/2022 10:32 AM    [place]

## 2022-07-13 NOTE — Progress Notes (Signed)
Cardiology Office Note:    Date:  07/14/2022   ID:  Karen Gonzales, DOB Nov 14, 1945, MRN [id number]  PCP:  [name], MD   [facility] Cardiologist:  Buford Dresser, MD     Referring MD: [name], MD   Chief Complaint  Patient presents with   Aortic Stenosis    History of Present Illness:    Karen Gonzales is a 76 y.o. female referred by Dr Harrell Gave for evaluation of aortic stenosis.  The patient has a history of essential hypertension, mixed hyperlipidemia, type 2 diabetes, nonalcoholic steatohepatitis, anticardiolipin antibody syndrome, and progressive aortic stenosis.  The patient was diagnosed with moderate aortic stenosis earlier this year.  She was seen by Dr. Harrell Gave and a 35-monthfollow-up echocardiogram was recommended.  Recent echo study shows progressive aortic stenosis, now in the severe range.  The patient was referred to discuss treatment options.  She is here with her husband and daughter today. She has a longstanding heart murmur that became more prominent in the past few years. She was referred for an echocardiogram in May 2023 and this demonstrated moderate aortic stenosis. As above, a 6 month follow-up echo was performed 06/29/2022 and shows normal LVEF of 65%, calcified and restricted opening of the aortic valve, with peak and mean gradients of 58 and 33 mmHg, respectively. The DI is 0.24, SVI 33, and calculated AVA is 0.7 square cm, all consistent with severe stage D3 paradoxical low flow low gradient AS.   From a symptomatic perspective, she complains of progressive exertional dyspnea over the past 6 months, now limited by dyspnea with walking and light housework. She also complains of occasional chest discomfort, lightheadedness, and she has experienced worsening fatigue. No syncope. She has had regular dental care and is having a tooth pulled in the near future. She has been considering a low partial.   Past  Medical History:  Diagnosis Date   [place]ipin antibody syndrome [place] 06/[place]   Arthritis    Blood transfusion    had when knee was replaced   Bronchitis    history of   Chills    Complication of anesthesia    Cough    COVID-19    Diabetes mellitus    DVT (deep venous thrombosis) ([facility] 06/25/2011   Fatty liver disease, nonalcoholic    determinde by Ultrasound 11/11   GERD (gastroesophageal reflux disease)    H/O vitamin D deficiency    Heart murmur    Heart murmur    Benign   Hiatal hernia    Hyperlipidemia    Hypertension    Hyponatremia    with diuretic use   Osteopenia    PONV (postoperative nausea and vomiting)    Vertigo     Past Surgical History:  Procedure Laterality Date   ABDOMINAL HYSTERECTOMY     ACHILLES TENDON SURGERY     bilateral    CARPAL TUNNEL RELEASE     bilateral    CHOLECYSTECTOMY  1969   open   CRANIOTOMY  07/09/2011   Procedure: CRANIOTOMY HEMATOMA EVACUATION SUBDURAL;  Surgeon: HCooper RenderPool;  [facility];  Service: Neurosurgery;  Laterality: Right;  Excision of Skull Lesion-Right Right Parietal Crani w/ Resection of Skull Tumor (1 1/2 hours - to follow)   DLiberty[place]PLACEMENT  2004   left knee replacement    KNEE SURGERY     total of 6 surgeries on both  knees    REPAIR PERONEAL TENDONS ANKLE     TUBAL LIGATION     TUMOR REMOVAL     on pelvic bone     Current Medications: Current Meds  Medication Sig   amLODipine (NORVASC) 10 MG tablet TAKE 1 TABLET BY MOUTH EVERY DAY   aspirin EC 81 MG tablet Take 1 tablet (81 mg total) by mouth daily. Swallow whole.   Blood Glucose Monitoring Suppl (ONE TOUCH ULTRA 2) w/Device KIT Pt uses glucometer to check sugars twice daily. Dx E11.9   empagliflozin (JARDIANCE) 10 MG TABS tablet Take 1 tablet (10 mg total) by mouth daily.   gabapentin (NEURONTIN) 300 MG capsule TAKE 2 CAPSULES BY MOUTH 3 TIMES A DAY   glipiZIDE (GLUCOTROL XL) 5 MG 24 hr  tablet Take 1 tablet (5 mg total) by mouth daily with breakfast.   Lancets (ONETOUCH DELICA PLUS [id number]) MISC USE WHEN CHECKING BLOOD SUGAR AS DIRECTED   losartan (COZAAR) 50 MG tablet TAKE 2 TABLETS BY MOUTH EVERY DAY   meloxicam (MOBIC) 15 MG tablet TAKE 1 TABLET BY MOUTH EVERY DAY WITH A MEAL   metFORMIN (GLUCOPHAGE) 500 MG tablet TAKE 2 TABLETS BY MOUTH 2 TIMES DAILY WITH A MEAL.   omeprazole (PRILOSEC) 40 MG capsule Take 1 capsule (40 mg total) by mouth 2 (two) times daily. (Patient taking differently: Take 40 mg by mouth daily.)   ONETOUCH ULTRA test strip CHECK BLOOD SUGAR 1-2 TIMES DAILY   rosuvastatin (CRESTOR) 10 MG tablet Take 1 tablet (10 mg total) by mouth daily.   sitaGLIPtin (JANUVIA) 100 MG tablet Take 100 mg by mouth daily.   [DISCONTINUED] Vitamin D, Ergocalciferol, (DRISDOL) 1.25 MG (50000 UNIT) CAPS capsule TAKE 1 CAPSULE (50,000 UNITS TOTAL) BY MOUTH EVERY 7 (SEVEN) DAYS     Allergies:   Nsaids, Reglan [metoclopramide], Estrogens, and Other   Social History   Socioeconomic History   Marital status: Married    Spouse name: Not on file   Number of children: Not on file   Years of education: Not on file   Highest education level: Not on file  Occupational History   Not on file  Tobacco Use   Smoking status: Never   Smokeless tobacco: Never  Vaping Use   Vaping Use: Never used  Substance and Sexual Activity   Alcohol use: No   Drug use: No   Sexual activity: Not Currently  Other Topics Concern   Not on file  Social History Narrative   Not on file   Social Determinants of Health   Financial Resource Strain: Low Risk  (06/06/2022)   Overall Financial Resource Strain (CARDIA)    Difficulty of Paying Living Expenses: Not hard at all  Food Insecurity: No Food Insecurity (06/06/2022)   Hunger Vital Sign    Worried About Running Out of Food in the Last Year: Never true    Ran Out of Food in the Last Year: Never true  Transportation Needs: No Transportation  Needs (06/06/2022)   PRAPARE - Hydrologist (Medical): No    Lack of Transportation (Non-Medical): No  Physical Activity: Insufficiently Active (06/06/2022)   Exercise Vital Sign    Days of Exercise per Week: 2 days    Minutes of Exercise per Session: 20 min  Stress: No Stress Concern Present (06/06/2022)   [place]    Feeling of Stress : Not at all  Social Connections: Moderately Integrated (06/06/2022)  Social Licensed conveyancer [NHANES]    Frequency of Communication with Friends and Family: More than three times a week    Frequency of Social Gatherings with Friends and Family: More than three times a week    Attends Religious Services: More than 4 times per year    Active Member of [employer] or Organizations: No    Attends Archivist Meetings: Never    Marital Status: Married     Family History: The patient's family history includes Breast cancer in her maternal grandmother and paternal aunt; Cancer in her father, maternal grandmother, and paternal grandmother; Healthy in her brother; Heart disease in her father; Hodgkin's lymphoma in her grandchild; Kidney disease in her mother; Osteoporosis in her father and mother.  ROS:   Please see the history of present illness.    All other systems reviewed and are negative.  EKGs/Labs/Other Studies Reviewed:    The following studies were reviewed today: Echo 06/29/2022: IMPRESSIONS     1. Left ventricular ejection fraction, by estimation, is 65 to 70%. The  left ventricle has normal function. The left ventricle has no regional  wall motion abnormalities. There is mild concentric left ventricular  hypertrophy. Left ventricular diastolic  parameters are consistent with Grade I diastolic dysfunction (impaired  relaxation).   2. Right ventricular systolic function is normal. The right ventricular  size is normal.  Tricuspid regurgitation signal is inadequate for assessing  PA pressure.   3. Left atrial size was moderately dilated.   4. The mitral valve is normal in structure. No evidence of mitral valve  regurgitation.   5. The aortic valve is tricuspid. There is severe calcifcation of the  aortic valve. There is severe thickening of the aortic valve. Aortic valve  regurgitation is trivial. Severe aortic valve stenosis.   6. The inferior vena cava is normal in size with greater than 50%  respiratory variability, suggesting right atrial pressure of 3 mmHg.   Comparison(s): Prior images reviewed side by side. Aortic stenosis is now  severe. Although gradiients are similar to the previous study, this is in  the setting of a lower stroke volume. Dimensionless valve index is now  0.24.   LEFT VENTRICLE  PLAX 2D  LVIDd:         3.71 cm   Diastology  LVIDs:         2.72 cm   LV e' medial:    5.77 cm/s  LV PW:         1.38 cm   LV E/e' medial:  10.6  LV IVS:        1.40 cm   LV e' lateral:   10.10 cm/s  LVOT diam:     1.90 cm   LV E/e' lateral: 6.0  LV SV:         63  LV SV Index:   33  LVOT Area:     2.84 cm                             3D Volume EF:                           3D EF:        56 %                           LV EDV:  126 ml                           LV ESV:       55 ml                           LV SV:        71 ml   RIGHT VENTRICLE  RV Basal diam:  3.15 cm  RV Mid diam:    2.19 cm  RV S prime:     13.20 cm/s  TAPSE (M-mode): 2.3 cm   LEFT ATRIUM             Index        RIGHT ATRIUM           Index  LA diam:        4.40 cm 2.31 cm/m   RA Area:     12.30 cm  LA Vol (A2C):   75.2 ml 39.48 ml/m  RA Volume:   27.40 ml  14.38 ml/m  LA Vol (A4C):   79.8 ml 41.89 ml/m  LA Biplane Vol: 78.5 ml 41.21 ml/m   AORTIC VALVE  AV Area (Vmax):    0.87 cm  AV Area (Vmean):   0.80 cm  AV Area (VTI):     0.69 cm  AV Vmax:           380.00 cm/s  AV Vmean:          268.000 cm/s   AV VTI:            0.920 m  AV Peak Grad:      57.8 mmHg  AV Mean Grad:      33.0 mmHg  LVOT Vmax:         116.00 cm/s  LVOT Vmean:        75.200 cm/s  LVOT VTI:          0.223 m  LVOT/AV VTI ratio: 0.24  AI PHT:            419 msec    AORTA  Ao Asc diam: 3.20 cm   MITRAL VALVE  MV Area (PHT): 3.48 cm    SHUNTS  MV Decel Time: 218 msec    Systemic VTI:  0.22 m  MV E velocity: 60.90 cm/s  Systemic Diam: 1.90 cm  MV A velocity: 92.00 cm/s  MV E/A ratio:  0.66   EKG:  EKG from 09/12/2021 is reviewed and shows NSR 88 bpm, no significant conduction abnormality  Recent Labs: 01/18/2022: Magnesium 1.7 04/30/2022: ALT 30; BUN 17; Creatinine, Ser 0.82; Hemoglobin 11.3; Platelets 182.0; Potassium 4.3; Sodium 137; TSH 1.99  Recent Lipid Panel    Component Value Date/Time   CHOL 111 04/30/2022 0951   TRIG 146.0 04/30/2022 0951   HDL 48.00 04/30/2022 0951   CHOLHDL 2 04/30/2022 0951   VLDL 29.2 04/30/2022 0951   LDLCALC 34 04/30/2022 0951   LDLCALC 75 10/24/2017 1559   LDLDIRECT 104.0 04/08/2017 1125     Risk Assessment/Calculations:            Physical Exam:    VS:  BP (!) 150/70   Pulse 80   Ht 5' 4.75" (1.645 m)   Wt 183 lb (83 kg)   SpO2 96%   BMI 30.69 kg/m     Wt Readings from Last 3 Encounters:  07/13/22 183 lb (83 kg)  07/11/22 185 lb (83.9 kg)  06/06/22 183 lb (83 kg)     GEN:  Well nourished, well developed in no acute distress HEENT: Normal NECK: No JVD; No carotid bruits LYMPHATICS: No lymphadenopathy CARDIAC: RRR, 3/6 mid-peaking harsh systolic murmur with an absent A2 RESPIRATORY:  Clear to auscultation without rales, wheezing or rhonchi  ABDOMEN: Soft, non-tender, non-distended MUSCULOSKELETAL:  No edema; No deformity  SKIN: Warm and dry NEUROLOGIC:  Alert and oriented x 3 PSYCHIATRIC:  Normal affect   ASSESSMENT:    1. Nonrheumatic aortic valve stenosis   2. Pre-procedural cardiovascular examination    PLAN:    In order of problems  listed above:  The patient has severe, Stage D3, paradoxical low flow low gradient AS. Echo findings outlined above with LVEF 65-70%, low SVI of 33, dimensionless index of 0.24, AVA 0.7 square cm, and mean gradient 33 mmHg. Exam is consistent with severe AS with absent A2. Progressive symptoms now NYHA functional class 3 with exertional dyspnea and fatigue. I have reviewed the natural history of aortic stenosis with the patient and their family members (husband and daughter) who are present today. We have discussed the limitations of medical therapy and the poor prognosis associated with symptomatic aortic stenosis. We have reviewed potential treatment options, including palliative medical therapy, conventional surgical aortic valve replacement, and transcatheter aortic valve replacement. We discussed treatment options in the context of the patient's specific comorbid medical conditions.  She understands that further evaluation is necessary to determine the best treatment option. She will undergo R/L heart catheterization to evaluate for obstructive CAD and assess hemodynamics. I have reviewed the risks, indications, and alternatives to cardiac catheterization, possible angioplasty, and stenting with the patient. Risks include but are not limited to bleeding, infection, vascular injury, stroke, myocardial infection, arrhythmia, kidney injury, radiation-related injury in the case of prolonged fluoroscopy use, emergency cardiac surgery, and death. The patient understands the risks of serious complication is 1-2 in 4287 with diagnostic cardiac cath and 1-2% or less with angioplasty/stenting.  She will undergo CTA studies of the heart and the chest/abdomen/pelvis to assess cardiac anatomy and vascular access as it pertains to TAVR. Once her preop studies are completed, she will be referred for formal surgical consultation as part of a multidisciplinary evaluation.       Shared Decision Making/Informed Consent The  risks [stroke (1 in 1000), death (1 in 1000), kidney failure [usually temporary] (1 in 500), bleeding (1 in 200), allergic reaction [possibly serious] (1 in 200)], benefits (diagnostic support and management of coronary artery disease) and alternatives of a cardiac catheterization were discussed in detail with Ms. Doffing and she is willing to proceed.    Medication Adjustments/Labs and Tests Ordered: Current medicines are reviewed at length with the patient today.  Concerns regarding medicines are outlined above.  Orders Placed This Encounter  Procedures   CBC   Basic metabolic panel   No orders of the defined types were placed in this encounter.   Patient Instructions  Medication Instructions:  Your physician recommends that you continue on your current medications as directed. Please refer to the Current Medication list given to you today.  *If you need a refill on your cardiac medications before your next appointment, please call your pharmacy*   Lab Work: CBC, BMET If you have labs (blood work) drawn today and your tests are completely normal, you will receive your results only by: [place] (if you have MyChart) OR A paper copy in the mail If  you have any lab test that is abnormal or we need to change your treatment, we will call you to review the results.   Testing/Procedures: R & L Heart catheterization Your physician has requested that you have a cardiac catheterization. Cardiac catheterization is used to diagnose and/or treat various heart conditions. Doctors may recommend this procedure for a number of different reasons. The most common reason is to evaluate chest pain. Chest pain can be a symptom of coronary artery disease (CAD), and cardiac catheterization can show whether plaque is narrowing or blocking your heart's arteries. This procedure is also used to evaluate the valves, as well as measure the blood flow and oxygen levels in different parts of your heart. For  further information please visit HugeFiesta.[email or web address]. Please follow instruction sheet, as given.  Referral to TCTS  *you will be called to schedule  TAVR CT scans   *you will be called to schedule  Follow-Up: At [facility], you and your health needs are our priority.  As part of our continuing mission to provide you with exceptional heart care, we have created designated Provider Care Teams.  These Care Teams include your primary Cardiologist (physician) and Advanced Practice Providers (APPs -  Physician Assistants and Nurse Practitioners) who all work together to provide you with the care you need, when you need it.  Your next appointment:   Structural team will follow-up  The format for your next appointment:   In Person  Provider:   Sherren Mocha, MD  Other Instructions       Cardiac/Peripheral Catheterization   You are scheduled for a Cardiac Catheterization on Thursday, December 7 with Dr. Lauree Chandler.  1. Please arrive at the Main Entrance A at [facility]: [place], [place] [address] on December 7 at 5:30 AM (This time is two hours before your procedure to ensure your preparation). Free valet parking service is available. You will check in at ADMITTING. The support person will be asked to wait in the waiting room.  It is OK to have someone drop you off and come back when you are ready to be discharged.        Special note: Every effort is made to have your procedure done on time. Please understand that emergencies sometimes delay scheduled procedures.   . 2. Diet: Do not eat solid foods after midnight.  You may have clear liquids until 5 AM the day of the procedure.  3. Labs: You will need to have blood drawn on Monday, December 4 at [facility] at [facility]. [address]  Open: 7:30am - 5pm    Phone: [phone]. You do not need to be fasting.  4. Medication instructions in preparation for  your procedure:   Contrast Allergy: No  DO NOT TAKE Glipizide, Jardiance, or Januvia the day of procedure DO NOT TAKE Metformin the day of and skip for 2 days after procedure  On the morning of your procedure, take Aspirin 81 mg and any morning medicines NOT listed above.  You may use sips of water.  5. Plan to go home the same day, you will only stay overnight if medically necessary. 6. You MUST have a responsible adult to drive you home. 7. An adult MUST be with you the first 24 hours after you arrive home. 8. Bring a current list of your medications, and the last time and date medication taken. 9. Bring ID and current insurance cards. 10.Please  wear clothes that are easy to get on and off and wear slip-on shoes.  Thank you for allowing [place] to care for you!   -- [facility]   Important Information About Sugar         Signed, Sherren Mocha, MD  07/14/2022 10:32 AM    [place]

## 2022-07-13 NOTE — Progress Notes (Addendum)
Pre Surgical Assessment: 5 M Walk Test  88M=16.47f  5 Meter Walk Test- trial 1: 7.23 seconds 5 Meter Walk Test- trial 2: 6.95 seconds 5 Meter Walk Test- trial 3: 6.62 seconds 5 Meter Walk Test Average: 6.93 seconds   ___________________________  Procedure Type: Isolated AVR Perioperative Outcome Estimate % Operative Mortality 2.77% Morbidity & Mortality 8.16% Stroke 1.06% Renal Failure 1.64% Reoperation 3.39% Prolonged Ventilation 3.49% Deep Sternal Wound Infection 0.051% LPace HospitalStay (>14 days) 5.02% Short Hospital Stay (<6 days)* 41.3%

## 2022-07-13 NOTE — Patient Instructions (Signed)
Medication Instructions:  Your physician recommends that you continue on your current medications as directed. Please refer to the Current Medication list given to you today.  *If you need a refill on your cardiac medications before your next appointment, please call your pharmacy*   Lab Work: CBC, BMET If you have labs (blood work) drawn today and your tests are completely normal, you will receive your results only by: [place] (if you have MyChart) OR A paper copy in the mail If you have any lab test that is abnormal or we need to change your treatment, we will call you to review the results.   Testing/Procedures: R & L Heart catheterization Your physician has requested that you have a cardiac catheterization. Cardiac catheterization is used to diagnose and/or treat various heart conditions. Doctors may recommend this procedure for a number of different reasons. The most common reason is to evaluate chest pain. Chest pain can be a symptom of coronary artery disease (CAD), and cardiac catheterization can show whether plaque is narrowing or blocking your heart's arteries. This procedure is also used to evaluate the valves, as well as measure the blood flow and oxygen levels in different parts of your heart. For further information please visit HugeFiesta.[email or web address]. Please follow instruction sheet, as given.  Referral to TCTS  *you will be called to schedule  TAVR CT scans   *you will be called to schedule  Follow-Up: At [facility], you and your health needs are our priority.  As part of our continuing mission to provide you with exceptional heart care, we have created designated Provider Care Teams.  These Care Teams include your primary Cardiologist (physician) and Advanced Practice Providers (APPs -  Physician Assistants and Nurse Practitioners) who all work together to provide you with the care you need, when you need it.  Your next appointment:   Structural team will  follow-up  The format for your next appointment:   In Person  Provider:   Sherren Mocha, MD  Other Instructions       Cardiac/Peripheral Catheterization   You are scheduled for a Cardiac Catheterization on Thursday, December 7 with Dr. Lauree Chandler.  1. Please arrive at the Main Entrance A at [facility]: [place], [place] [address] on December 7 at 5:30 AM (This time is two hours before your procedure to ensure your preparation). Free valet parking service is available. You will check in at ADMITTING. The support person will be asked to wait in the waiting room.  It is OK to have someone drop you off and come back when you are ready to be discharged.        Special note: Every effort is made to have your procedure done on time. Please understand that emergencies sometimes delay scheduled procedures.   . 2. Diet: Do not eat solid foods after midnight.  You may have clear liquids until 5 AM the day of the procedure.  3. Labs: You will need to have blood drawn on Monday, December 4 at [facility] at [facility]. [address]  Open: 7:30am - 5pm    Phone: [phone]. You do not need to be fasting.  4. Medication instructions in preparation for your procedure:   Contrast Allergy: No  DO NOT TAKE Glipizide, Jardiance, or Januvia the day of procedure DO NOT TAKE Metformin the day of and skip for 2 days after procedure  On the morning of your procedure, take  Aspirin 81 mg and any morning medicines NOT listed above.  You may use sips of water.  5. Plan to go home the same day, you will only stay overnight if medically necessary. 6. You MUST have a responsible adult to drive you home. 7. An adult MUST be with you the first 24 hours after you arrive home. 8. Bring a current list of your medications, and the last time and date medication taken. 9. Bring ID and current insurance cards. 10.Please wear clothes that are  easy to get on and off and wear slip-on shoes.  Thank you for allowing [place] to care for you!   -- [place] [facility]   Important Information About Sugar

## 2022-07-14 ENCOUNTER — Encounter: Payer: Self-pay | Admitting: Cardiovascular Disease

## 2022-07-16 ENCOUNTER — Ambulatory Visit: Payer: PPO | Attending: Cardiovascular Disease

## 2022-07-16 DIAGNOSIS — I350 Nonrheumatic aortic (valve) stenosis: Secondary | ICD-10-CM | POA: Diagnosis not present

## 2022-07-16 DIAGNOSIS — Z01810 Encounter for preprocedural cardiovascular examination: Secondary | ICD-10-CM | POA: Diagnosis not present

## 2022-07-16 LAB — BASIC METABOLIC PANEL
BUN/Creatinine Ratio: 26 (ref 12–28)
BUN: 19 mg/dL (ref 8–27)
CO2: 24 mmol/L (ref 20–29)
Calcium: 9.8 mg/dL (ref 8.7–10.3)
Chloride: 100 mmol/L (ref 96–106)
Creatinine, Ser: 0.73 mg/dL (ref 0.57–1.00)
Glucose: 146 mg/dL — ABNORMAL HIGH (ref 70–99)
Potassium: 4.6 mmol/L (ref 3.5–5.2)
Sodium: 135 mmol/L (ref 134–144)
eGFR: 86 mL/min/{1.73_m2} (ref >59)

## 2022-07-16 LAB — CBC
Hematocrit: 33.6 % — ABNORMAL LOW (ref 34.0–46.6)
Hemoglobin: 11.1 g/dL (ref 11.1–15.9)
MCH: 26.3 pg — ABNORMAL LOW (ref 26.6–33.0)
MCHC: 33 g/dL (ref 31.5–35.7)
MCV: 80 fL (ref 79–97)
Platelets: 204 10*3/uL (ref 150–450)
RBC: 4.22 x10E6/uL (ref 3.77–5.28)
RDW: 15.4 % (ref 11.7–15.4)
WBC: 8.4 10*3/uL (ref 3.4–10.8)

## 2022-07-17 ENCOUNTER — Telehealth: Payer: Self-pay | Admitting: *Deleted

## 2022-07-17 NOTE — Telephone Encounter (Addendum)
Cardiac Catheterization scheduled at [facility] for: Thursday July 19, 2022 7:30 AM Arrival time and place: [place] Entrance A at: 5:30 AM  Nothing to eat after midnight prior to procedure, clear liquids until 5 AM day of procedure.  Medication instructions: -Hold:  Metformin-day of procedure and 48 hours post procedure  Jardiance/Januvia/Glipizide-AM of procedure -Except hold medications usual morning medications can be taken with sips of water including aspirin 81 mg.  Confirmed patient has responsible adult to drive home post procedure and be with patient first 24 hours after arriving home.  Patient reports no new symptoms concerning for COVID-19 in the past 10 days.  Reviewed procedure instructions with patient.

## 2022-07-18 ENCOUNTER — Other Ambulatory Visit: Payer: Self-pay | Admitting: Physician Assistant

## 2022-07-18 ENCOUNTER — Encounter: Payer: Self-pay | Admitting: Cardiovascular Disease

## 2022-07-18 DIAGNOSIS — I350 Nonrheumatic aortic (valve) stenosis: Secondary | ICD-10-CM

## 2022-07-19 ENCOUNTER — Encounter (HOSPITAL_COMMUNITY)
Admission: RE | Disposition: A | Discharge status: Home / Self Care | Payer: Self-pay | Source: Ambulatory Visit | Attending: Cardiovascular Disease

## 2022-07-19 ENCOUNTER — Encounter (HOSPITAL_COMMUNITY): Payer: Self-pay | Admitting: Cardiovascular Disease

## 2022-07-19 ENCOUNTER — Encounter: Payer: Self-pay | Admitting: Physician Assistant

## 2022-07-19 ENCOUNTER — Other Ambulatory Visit: Payer: Self-pay | Admitting: Physician Assistant

## 2022-07-19 ENCOUNTER — Ambulatory Visit (HOSPITAL_COMMUNITY)
Admission: RE | Admit: 2022-07-19 | Discharge: 2022-07-19 | Disposition: A | Discharge status: Home / Self Care | Payer: PPO | Source: Ambulatory Visit | Attending: Cardiovascular Disease | Admitting: Cardiovascular Disease

## 2022-07-19 ENCOUNTER — Other Ambulatory Visit: Payer: Self-pay

## 2022-07-19 DIAGNOSIS — I350 Nonrheumatic aortic (valve) stenosis: Secondary | ICD-10-CM

## 2022-07-19 DIAGNOSIS — E119 Type 2 diabetes mellitus without complications: Secondary | ICD-10-CM | POA: Insufficient documentation

## 2022-07-19 DIAGNOSIS — Z7984 Long term (current) use of oral hypoglycemic drugs: Secondary | ICD-10-CM | POA: Insufficient documentation

## 2022-07-19 DIAGNOSIS — I10 Essential (primary) hypertension: Secondary | ICD-10-CM | POA: Diagnosis not present

## 2022-07-19 DIAGNOSIS — K7581 Nonalcoholic steatohepatitis (NASH): Secondary | ICD-10-CM | POA: Diagnosis not present

## 2022-07-19 DIAGNOSIS — E782 Mixed hyperlipidemia: Secondary | ICD-10-CM | POA: Diagnosis not present

## 2022-07-19 DIAGNOSIS — D6861 Antiphospholipid syndrome: Secondary | ICD-10-CM | POA: Diagnosis not present

## 2022-07-19 HISTORY — PX: RIGHT/LEFT HEART CATH AND CORONARY ANGIOGRAPHY: CATH118266

## 2022-07-19 LAB — POCT I-STAT 7, (LYTES, BLD GAS, ICA,H+H)
Acid-base deficit: 6 mmol/L — ABNORMAL HIGH (ref 0.0–2.0)
Bicarbonate: 18.6 mmol/L — ABNORMAL LOW (ref 20.0–28.0)
Calcium, Ion: 0.85 mmol/L — CL (ref 1.15–1.40)
HCT: 27 % — ABNORMAL LOW (ref 36.0–46.0)
Hemoglobin: 9.2 g/dL — ABNORMAL LOW (ref 12.0–15.0)
O2 Saturation: 99 %
Potassium: 3.3 mmol/L — ABNORMAL LOW (ref 3.5–5.1)
Sodium: 147 mmol/L — ABNORMAL HIGH (ref 135–145)
TCO2: 20 mmol/L — ABNORMAL LOW (ref 22–32)
pCO2 arterial: 33.9 mmHg (ref 32–48)
pH, Arterial: 7.346 — ABNORMAL LOW (ref 7.35–7.45)
pO2, Arterial: 130 mmHg — ABNORMAL HIGH (ref 83–108)

## 2022-07-19 LAB — POCT I-STAT EG7
Acid-base deficit: 2 mmol/L (ref 0.0–2.0)
Bicarbonate: 23.7 mmol/L (ref 20.0–28.0)
Calcium, Ion: 1.22 mmol/L (ref 1.15–1.40)
HCT: 34 % — ABNORMAL LOW (ref 36.0–46.0)
Hemoglobin: 11.6 g/dL — ABNORMAL LOW (ref 12.0–15.0)
O2 Saturation: 75 %
Potassium: 4.4 mmol/L (ref 3.5–5.1)
Sodium: 139 mmol/L (ref 135–145)
TCO2: 25 mmol/L (ref 22–32)
pCO2, Ven: 42.7 mmHg — ABNORMAL LOW (ref 44–60)
pH, Ven: 7.353 (ref 7.25–7.43)
pO2, Ven: 42 mmHg (ref 32–45)

## 2022-07-19 LAB — GLUCOSE, CAPILLARY
Glucose-Capillary: 161 mg/dL — ABNORMAL HIGH (ref 70–99)
Glucose-Capillary: 115 mg/dL — ABNORMAL HIGH (ref 70–99)

## 2022-07-19 SURGERY — RIGHT/LEFT HEART CATH AND CORONARY ANGIOGRAPHY
Anesthesia: LOCAL

## 2022-07-19 MED ORDER — SODIUM CHLORIDE 0.9 % WEIGHT BASED INFUSION: 1.0000 mL/kg/h | INTRAVENOUS | Status: DC

## 2022-07-19 MED ORDER — IOHEXOL 350 MG/ML SOLN
INTRAVENOUS | Status: DC | PRN
  Administered 2022-07-19: 45 mL

## 2022-07-19 MED ORDER — HEPARIN SODIUM (PORCINE) 1000 UNIT/ML IJ SOLN
INTRAMUSCULAR | Status: DC | PRN
  Administered 2022-07-19: 4000 [IU] via INTRAVENOUS

## 2022-07-19 MED ORDER — LIDOCAINE HCL (PF) 1 % IJ SOLN
INTRAMUSCULAR | Status: AC
  Filled 2022-07-19: qty 30

## 2022-07-19 MED ORDER — MIDAZOLAM HCL 2 MG/2ML IJ SOLN
INTRAMUSCULAR | Status: AC
  Filled 2022-07-19: qty 2

## 2022-07-19 MED ORDER — FENTANYL CITRATE (PF) 100 MCG/2ML IJ SOLN
INTRAMUSCULAR | Status: DC | PRN
  Administered 2022-07-19 (×2): 25 ug via INTRAVENOUS

## 2022-07-19 MED ORDER — SODIUM CHLORIDE 0.9 % IV SOLN: 250.0000 mL | INTRAVENOUS | Status: DC | PRN

## 2022-07-19 MED ORDER — VERAPAMIL HCL 2.5 MG/ML IV SOLN
INTRAVENOUS | Status: AC
  Filled 2022-07-19: qty 2

## 2022-07-19 MED ORDER — HEPARIN SODIUM (PORCINE) 1000 UNIT/ML IJ SOLN
INTRAMUSCULAR | Status: AC
  Filled 2022-07-19: qty 10

## 2022-07-19 MED ORDER — VERAPAMIL HCL 2.5 MG/ML IV SOLN
INTRAVENOUS | Status: DC | PRN
  Administered 2022-07-19: 10 mL via INTRA_ARTERIAL

## 2022-07-19 MED ORDER — FENTANYL CITRATE (PF) 100 MCG/2ML IJ SOLN
INTRAMUSCULAR | Status: AC
  Filled 2022-07-19: qty 2

## 2022-07-19 MED ORDER — METOPROLOL TARTRATE 25 MG PO TABS: 25.0000 mg | ORAL_TABLET | ORAL | 0 refills | Status: DC

## 2022-07-19 MED ORDER — SODIUM CHLORIDE 0.9 % WEIGHT BASED INFUSION
3.0000 mL/kg/h | INTRAVENOUS | Status: AC
  Administered 2022-07-19: 3 mL/kg/h via INTRAVENOUS

## 2022-07-19 MED ORDER — SODIUM CHLORIDE 0.9% FLUSH: 3.0000 mL | INTRAVENOUS | Status: DC | PRN

## 2022-07-19 MED ORDER — MIDAZOLAM HCL 2 MG/2ML IJ SOLN
INTRAMUSCULAR | Status: DC | PRN
  Administered 2022-07-19 (×2): 1 mg via INTRAVENOUS

## 2022-07-19 MED ORDER — HEPARIN (PORCINE) IN NACL 1000-0.9 UT/500ML-% IV SOLN
INTRAVENOUS | Status: DC | PRN
  Administered 2022-07-19 (×2): 500 mL

## 2022-07-19 MED ORDER — LIDOCAINE HCL (PF) 1 % IJ SOLN
INTRAMUSCULAR | Status: DC | PRN
  Administered 2022-07-19 (×2): 2 mL

## 2022-07-19 MED ORDER — HEPARIN (PORCINE) IN NACL 1000-0.9 UT/500ML-% IV SOLN
INTRAVENOUS | Status: AC
  Filled 2022-07-19: qty 1000

## 2022-07-19 MED ORDER — ASPIRIN 81 MG PO CHEW: 81.0000 mg | CHEWABLE_TABLET | ORAL | Status: AC

## 2022-07-19 MED ORDER — SODIUM CHLORIDE 0.9% FLUSH: 3.0000 mL | Freq: Two times a day (BID) | INTRAVENOUS | Status: DC

## 2022-07-19 SURGICAL SUPPLY — 12 items
CATH 5FR JL3.5 JR4 ANG PIG MP (CATHETERS) IMPLANT
CATH SWAN GANZ 7F STRAIGHT (CATHETERS) IMPLANT
DEVICE RAD COMP TR BAND LRG (VASCULAR PRODUCTS) IMPLANT
DEVICE RAD TR BAND REGULAR (VASCULAR PRODUCTS) IMPLANT
GLIDESHEATH SLEND SS 6F .021 (SHEATH) IMPLANT
GLIDESHEATH SLENDER 7FR .021G (SHEATH) IMPLANT
GUIDEWIRE INQWIRE 1.5J.035X260 (WIRE) IMPLANT
INQWIRE 1.5J .035X260CM (WIRE) ×1
KIT HEART LEFT (KITS) ×1 IMPLANT
PACK CARDIAC CATHETERIZATION (CUSTOM PROCEDURE TRAY) ×1 IMPLANT
TRANSDUCER W/STOPCOCK (MISCELLANEOUS) ×1 IMPLANT
TUBING CIL FLEX 10 FLL-RA (TUBING) ×1 IMPLANT

## 2022-07-19 NOTE — Interval H&P Note (Signed)
History and Physical Interval Note:  07/19/2022 7:08 AM  Karen Gonzales  has presented today for surgery, with the diagnosis of aortic stenosis.  The various methods of treatment have been discussed with the patient and family. After consideration of risks, benefits and other options for treatment, the patient has consented to  Procedure(s): RIGHT/LEFT HEART CATH AND CORONARY ANGIOGRAPHY (N/A) as a surgical intervention.  The patient's history has been reviewed, patient examined, no change in status, stable for surgery.  I have reviewed the patient's chart and labs.  Questions were answered to the patient's satisfaction.    Cath Lab Visit (complete for each Cath Lab visit)  Clinical Evaluation Leading to the Procedure:   ACS: No.  Non-ACS:    Anginal Classification: CCS II  Anti-ischemic medical therapy: Minimal Therapy (1 class of medications)  Non-Invasive Test Results: No non-invasive testing performed  Prior CABG: No previous CABG        Lauree Chandler

## 2022-07-19 NOTE — Progress Notes (Signed)
TR band removed and new dressing in place along w/ arm brace. No s/s of bleeding or hematoma noted. Will conitnue to monitor.

## 2022-07-24 ENCOUNTER — Telehealth: Payer: Self-pay | Admitting: Pharmacist

## 2022-07-24 NOTE — Progress Notes (Signed)
Chronic Care Management Pharmacy Assistant   Name: Karen Gonzales  MRN: [id number] DOB: 07-07-1946   Reason for Encounter: Disease State - Diabetes Call     Recent office visits:  04/30/22 Annye Asa, MD - [place] were ordered. Follow up 3-4 months.  Recent consult visits:  07/13/22 Sherren Mocha MD - Non rheumatic aortic valve stenosis - Labs were ordered. Referred to surgery for further evaluation for possible stenting. Follow up as scheduled.   07/11/22 Buford Dresser MD - Cardiology - EKG performed. Referral to Structural heart and valve clinic. Follow up in 6 months.   Hospital visits: 07/19/22 Medication Reconciliation was completed by comparing discharge summary, patient's EMR and Pharmacy list, and upon discussion with patient.  Admitted to the hospital on 07/19/22 due to Cardiac Cath. Discharge date was 07/19/22. Discharged from [facility]?Medications Started at [facility] Discharge:?? None noted.   Medication Changes at Hospital Discharge: None noted.  Medications Discontinued at Hospital Discharge: None noted.   Medications that remain the same after Hospital Discharge:??  All other medications will remain the same.     Medications: Outpatient Encounter Medications as of 07/24/2022  Medication Sig   metoprolol tartrate (LOPRESSOR) 25 MG tablet Take 1 tablet (25 mg total) by mouth as directed. Take 2 hours before your CT scans on 12/21 to slow heart rate   amLODipine (NORVASC) 10 MG tablet TAKE 1 TABLET BY MOUTH EVERY DAY   aspirin EC 81 MG tablet Take 1 tablet (81 mg total) by mouth daily. Swallow whole.   Blood Glucose Monitoring Suppl (ONE TOUCH ULTRA 2) w/Device KIT Pt uses glucometer to check sugars twice daily. Dx E11.9   empagliflozin (JARDIANCE) 10 MG TABS tablet Take 1 tablet (10 mg total) by mouth daily. (Patient not taking: Reported on 07/16/2022)   empagliflozin (JARDIANCE) 25 MG TABS tablet Take by mouth  daily.   gabapentin (NEURONTIN) 300 MG capsule TAKE 2 CAPSULES BY MOUTH 3 TIMES A DAY (Patient taking differently: Take 600 mg by mouth 2 (two) times daily.)   glipiZIDE (GLUCOTROL XL) 10 MG 24 hr tablet Take 10 mg by mouth daily with breakfast.   glipiZIDE (GLUCOTROL XL) 5 MG 24 hr tablet Take 1 tablet (5 mg total) by mouth daily with breakfast. (Patient not taking: Reported on 07/16/2022)   Lancets (ONETOUCH DELICA PLUS [id number]) MISC USE WHEN CHECKING BLOOD SUGAR AS DIRECTED   losartan (COZAAR) 50 MG tablet TAKE 2 TABLETS BY MOUTH EVERY DAY (Patient taking differently: Take 50 mg by mouth 2 (two) times daily.)   melatonin 5 MG TABS Take 5 mg by mouth at bedtime as needed (sleep).   meloxicam (MOBIC) 15 MG tablet TAKE 1 TABLET BY MOUTH EVERY DAY WITH A MEAL   metFORMIN (GLUCOPHAGE) 500 MG tablet TAKE 2 TABLETS BY MOUTH 2 TIMES DAILY WITH A MEAL.   omeprazole (PRILOSEC) 40 MG capsule Take 1 capsule (40 mg total) by mouth 2 (two) times daily. (Patient taking differently: Take 40 mg by mouth daily.)   ONETOUCH ULTRA test strip CHECK BLOOD SUGAR 1-2 TIMES DAILY   oxymetazoline (AFRIN 12 HOUR) 0.05 % nasal spray Place 1 spray into both nostrils 2 (two) times daily as needed for congestion.   rosuvastatin (CRESTOR) 10 MG tablet Take 1 tablet (10 mg total) by mouth daily.   sitaGLIPtin (JANUVIA) 100 MG tablet Take 100 mg by mouth daily.   No facility-administered encounter medications on file as of 07/24/2022.  Current antihyperglycemic regimen:  sitaGLIPtin (JANUVIA) 100 MG tablet  metFORMIN (GLUCOPHAGE) 500 MG tablet  glipiZIDE (GLUCOTROL XL) 5 MG 24 hr tablet  glipiZIDE (GLUCOTROL XL) 10 MG 24 hr tablet  empagliflozin (JARDIANCE) 25 MG TABS tablet  empagliflozin (JARDIANCE) 10 MG TABS tablet   What recent interventions/DTPs have been made to improve glycemic control:  Patient reported no recent changes in medication since last visit with CPP   Have there been any recent hospitalizations  or ED visits since last visit with CPP?  Patient had a hospital visit for outpatient cardiac cath on 07/19/22. Patient reports she is doing well.   Patient denies hypoglycemic symptoms, including Pale, Sweaty, Shaky, Hungry, Nervous/irritable, and Vision changes   Patient denies hyperglycemic symptoms, including blurry vision, excessive thirst, fatigue, polyuria, and weakness   How often are you checking your blood sugar? Patient reported checking blood sugars daily.   What are your blood sugars ranging?  Fasting: 130-140 Before meals:  After meals:  Bedtime:   During the week, how often does your blood glucose drop below 70?  Patient reported one reading that was 61 when she was off her medication prior to her cardiac cath but resolved once she drank some OJ  Are you checking your feet daily/regularly? Patient reported checking her feet regularly and current has no concerns.     Adherence Review: Is the patient currently on a STATIN medication? Yes Is the patient currently on ACE/ARB medication? Yes Does the patient have >5 day gap between last estimated fill dates? Yes   Care Gaps   AWV: done 06/05/21 Colonoscopy: done 08/24/19 DM Eye Exam: scheduled 12/01/21  DM Foot Exam: done 05/23/22 Microalbumin: unknown  HbgAIC:  done 09/25/21 (7.3) DEXA: done 11/21/21 Mammogram: done 07/25/21     [facility] simvastatin (ZOCOR) 10 MG tablet - last filled 12/05/21 90 days  MetFORMIN (GLUCOPHAGE) 500 MG tablet - last filled 05/07/22 90 days  glipiZIDE (GLUCOTROL XL) 5 MG 24 hr tablet - last filled 11/22/21 90 days  losartan (COZAAR) 50 MG tablet - last filled 08/21/21 90 days  empagliflozin (JARDIANCE) 10 MG TABS tablet - last filled 02/21/20 30 days (?PAP)    Future Appointments  Date Time Provider [place]  07/31/2022  9:20 AM [name], MD [facility]  08/02/2022 10:30 AM [facility]  08/16/2022 10:00 AM Enter, Pierre Bali, MD [facility]   01/09/2023  9:20 AM Buford Dresser, MD [facility] [place], [facility] Clinical Pharmacist Assistant  [phone]

## 2022-07-25 NOTE — Chronic Care Management (AMB) (Signed)
Do we know where these forms went?  The program is saying they have not received them yet.  I checked the media tab but didn't see them!  Beverly Milch, PharmD Clinical Pharmacist  [facility] [phone]

## 2022-07-26 NOTE — Telephone Encounter (Signed)
Ok, do you know how I can track it down?  They are needing me to mail it.

## 2022-07-26 NOTE — Telephone Encounter (Signed)
I have looked in her media as well and they have not been scanned in as of yet . Should we get new forms ? I do not know what the hold up is

## 2022-07-31 ENCOUNTER — Ambulatory Visit (INDEPENDENT_AMBULATORY_CARE_PROVIDER_SITE_OTHER): Payer: PPO | Admitting: Family Medicine

## 2022-07-31 ENCOUNTER — Encounter: Payer: Self-pay | Admitting: Family Medicine

## 2022-07-31 VITALS — BP 136/66 | HR 86 | Temp 98.8°F | Resp 17 | Ht 65.0 in | Wt 180.1 lb

## 2022-07-31 DIAGNOSIS — E1140 Type 2 diabetes mellitus with diabetic neuropathy, unspecified: Secondary | ICD-10-CM | POA: Diagnosis not present

## 2022-07-31 LAB — BASIC METABOLIC PANEL
BUN: 21 mg/dL (ref 6–23)
CO2: 25 mEq/L (ref 19–32)
Calcium: 10.1 mg/dL (ref 8.4–10.5)
Chloride: 99 mEq/L (ref 96–112)
Creatinine, Ser: 0.85 mg/dL (ref 0.40–1.20)
GFR: 67.14 mL/min (ref >60.00)
Glucose, Bld: 119 mg/dL — ABNORMAL HIGH (ref 70–99)
Potassium: 4.6 mEq/L (ref 3.5–5.1)
Sodium: 133 mEq/L — ABNORMAL LOW (ref 135–145)

## 2022-07-31 LAB — HEMOGLOBIN A1C: Hgb A1c MFr Bld: 7.6 % — ABNORMAL HIGH (ref 4.6–6.5)

## 2022-07-31 NOTE — Progress Notes (Signed)
   Subjective:    Patient ID: Karen Gonzales, female    DOB: 01-21-1946, 76 y.o.   MRN: [id number]  HPI DM- chronic problem, on Glipizide 53m daily, Jardiance 237mdaily, Metformin 100043mID, Januvia 100m67mily.  UTD on eye exam, foot exam, microalbumin.  Last A1C 7.5%9.0% is not certain of the doses of her medication.  No CP.  Some SOB.  Has had HA since heart cath.  Denies visual changes.  + neuropathy of feet.  No abd pain, N/V.   Review of Systems For ROS see HPI     Objective:   Physical Exam Vitals reviewed.  Constitutional:      General: She is not in acute distress.    Appearance: Normal appearance. She is well-developed.  HENT:     Head: Normocephalic and atraumatic.  Eyes:     Conjunctiva/sclera: Conjunctivae normal.     Pupils: Pupils are equal, round, and reactive to light.  Neck:     Thyroid: No thyromegaly.  Cardiovascular:     Rate and Rhythm: Normal rate and regular rhythm.     Heart sounds: Murmur (II-III/VI SEM) heard.  Pulmonary:     Effort: Pulmonary effort is normal. No respiratory distress.     Breath sounds: Normal breath sounds.  Abdominal:     General: There is no distension.     Palpations: Abdomen is soft.     Tenderness: There is no abdominal tenderness.  Musculoskeletal:     Cervical back: Normal range of motion and neck supple.  Lymphadenopathy:     Cervical: No cervical adenopathy.  Skin:    General: Skin is warm and dry.  Neurological:     Mental Status: She is alert and oriented to person, place, and time.  Psychiatric:        Behavior: Behavior normal.           Assessment & Plan:

## 2022-07-31 NOTE — Patient Instructions (Signed)
Schedule your complete physical in 3-4 months We'll notify you of your lab results and make any changes if needed Please check your medication bottles at home and let me know what you are taking- particularly which dose of Glipizide and Jardiance Call with any questions or concerns Stay Safe!  Stay Healthy! Happy Holidays!!!

## 2022-07-31 NOTE — Telephone Encounter (Signed)
According to the medication list patient is to be taking glipizide 5 mg and jardiance 10 mg  Please advise

## 2022-08-01 ENCOUNTER — Other Ambulatory Visit: Payer: Self-pay

## 2022-08-01 ENCOUNTER — Telehealth: Payer: Self-pay

## 2022-08-01 NOTE — Telephone Encounter (Signed)
-----   Message from [name], MD sent at 07/31/2022  9:10 PM EST ----- Labs are stable.  No med changes at this time

## 2022-08-01 NOTE — Telephone Encounter (Signed)
I will fill out new forms for her and fax them over tomorrow for Dr. Birdie Riddle to sign.  Patient will need to sign and they have to placed in the actually mail to the manufacturer. For some reason this program does not take faxes.

## 2022-08-01 NOTE — Telephone Encounter (Signed)
Pt notes she has not made changes but strength doesn't match her medication list

## 2022-08-01 NOTE — Telephone Encounter (Signed)
Will mail for pt once DR tabori and pt sign forms

## 2022-08-01 NOTE — Telephone Encounter (Signed)
I have sent a message to Beverly Milch asking if he can clarify the correct dosage on her medications Jardiance and Glipizide

## 2022-08-01 NOTE — Telephone Encounter (Signed)
Informed pt of lab result

## 2022-08-02 ENCOUNTER — Ambulatory Visit (HOSPITAL_COMMUNITY)
Admission: RE | Admit: 2022-08-02 | Discharge: 2022-08-02 | Disposition: A | Discharge status: Home / Self Care | Payer: PPO | Source: Ambulatory Visit | Attending: Physician Assistant | Admitting: Physician Assistant

## 2022-08-02 ENCOUNTER — Encounter (HOSPITAL_COMMUNITY): Payer: Self-pay

## 2022-08-02 DIAGNOSIS — I700 Atherosclerosis of aorta: Secondary | ICD-10-CM | POA: Diagnosis not present

## 2022-08-02 DIAGNOSIS — Z01818 Encounter for other preprocedural examination: Secondary | ICD-10-CM | POA: Diagnosis not present

## 2022-08-02 DIAGNOSIS — I350 Nonrheumatic aortic (valve) stenosis: Secondary | ICD-10-CM | POA: Diagnosis not present

## 2022-08-02 MED ORDER — IOHEXOL 350 MG/ML SOLN
95.0000 mL | Freq: Once | INTRAVENOUS | Status: AC | PRN
  Administered 2022-08-02: 95 mL via INTRAVENOUS

## 2022-08-02 NOTE — Telephone Encounter (Signed)
Forms placed in Dr Birdie Riddle to be signed folder

## 2022-08-02 NOTE — Telephone Encounter (Signed)
Forms have been mailed to the [employer] Patient [employer] Program [address] [place] [address]. Copies have been made and placed in scan

## 2022-08-02 NOTE — Telephone Encounter (Signed)
Signed and returned to [facility]

## 2022-08-05 ENCOUNTER — Other Ambulatory Visit: Payer: Self-pay | Admitting: Family Medicine

## 2022-08-09 ENCOUNTER — Other Ambulatory Visit: Payer: Self-pay | Admitting: Family Medicine

## 2022-08-09 NOTE — Telephone Encounter (Signed)
Patient is requesting a refill of the following medications: Requested Prescriptions   Pending Prescriptions Disp Refills   meloxicam (MOBIC) 15 MG tablet [Pharmacy Med Name: MELOXICAM 15 MG TABLET] 30 tablet 2    Sig: TAKE 1 TABLET BY MOUTH EVERY DAY WITH A MEAL    Date of patient request: 08/09/22 Last office visit: 07/31/22 Date of last refill: 04/09/22 Last refill amount: 30

## 2022-08-09 NOTE — Telephone Encounter (Signed)
Refill request received in PCPs absence.  Temporarily refilled meloxicam, future refills from PCP.

## 2022-08-16 ENCOUNTER — Institutional Professional Consult (permissible substitution): Payer: PPO | Admitting: Cardiothoracic Surgery

## 2022-08-16 VITALS — BP 160/74 | HR 84 | Resp 20 | Ht 65.0 in | Wt 183.0 lb

## 2022-08-16 DIAGNOSIS — I350 Nonrheumatic aortic (valve) stenosis: Secondary | ICD-10-CM | POA: Diagnosis not present

## 2022-08-21 ENCOUNTER — Other Ambulatory Visit: Payer: Self-pay

## 2022-08-21 DIAGNOSIS — I350 Nonrheumatic aortic (valve) stenosis: Secondary | ICD-10-CM

## 2022-08-21 NOTE — Progress Notes (Signed)
[facility]       Gonzales,Karen Gonzales             [id number]                    [facility] Medical Record #[id number] Date of Birth: [birth year]  Referring: Buford Dresser,* Primary Care: [name], MD Primary Cardiologist: Buford Dresser, MD  Chief Complaint:    Chief Complaint  Patient presents with   Aortic Stenosis    Surgical consult for TAVR    History of Present Illness:    Karen Gonzales 77 y.o. female  referred for TAVR evaluation.   Per cardiology H&P" The patient has a history of essential hypertension, mixed hyperlipidemia, type 2 diabetes, nonalcoholic steatohepatitis, anticardiolipin antibody syndrome, and progressive aortic stenosis.  The patient was diagnosed with moderate aortic stenosis earlier this year.  She was seen by Dr. Harrell Gave and a 48-month follow-up echocardiogram was recommended.  Recent echo study shows progressive aortic stenosis, now in the severe range.  The patient was referred to discuss treatment options.   She has a longstanding heart murmur that became more prominent in the past few years. She was referred for an echocardiogram in May 2023 and this demonstrated moderate aortic stenosis. As above, a 6 month follow-up echo was performed 06/29/2022 and shows normal LVEF of 65%, calcified and restricted opening of the aortic valve, with peak and mean gradients of 58 and 33 mmHg, respectively. The DI is 0.24, SVI 33, and calculated AVA is 0.7 square cm, all consistent with severe stage D3 paradoxical low flow low gradient AS.    From a symptomatic perspective, she complains of progressive exertional dyspnea over the past 6 months, now limited by dyspnea with walking and light housework. She also complains of occasional chest discomfort, lightheadedness, and she has experienced worsening fatigue. No syncope. She has had regular dental care and is having a tooth pulled in the near future.  She has been considering a low partial. "  My interview confirms symptomatic progressive valvular heart disease.  Dental will be fully taken care of soon.      Past Medical History:  Diagnosis Date   Anti-cardiolipin antibody syndrome ([place]) 06/25/2011   Arthritis    Blood transfusion    had when knee was replaced   Bronchitis    history of   Chills    Complication of anesthesia    Cough    COVID-19    Diabetes mellitus    DVT (deep venous thrombosis) ([place]) 06/25/2011   Fatty liver disease, nonalcoholic    determinde by Ultrasound 11/11   GERD (gastroesophageal reflux disease)    H/O vitamin D deficiency    Heart murmur    Heart murmur    Benign   Hiatal hernia    Hyperlipidemia    Hypertension    Hyponatremia    with diuretic use   Osteopenia    PONV (postoperative nausea and vomiting)    Vertigo     Past Surgical History:  Procedure Laterality Date   ABDOMINAL HYSTERECTOMY     ACHILLES TENDON SURGERY     bilateral    CARPAL TUNNEL RELEASE     bilateral    CHOLECYSTECTOMY  1969   open   CRANIOTOMY  07/09/2011   Procedure: CRANIOTOMY HEMATOMA EVACUATION SUBDURAL;  Surgeon: Cooper Render Pool;  Location: [facility];  Service: Neurosurgery;  Laterality: Right;  Excision  of Skull Lesion-Right Right Parietal Crani w/ Resection of Skull Tumor (1 1/2 hours - to follow)   [place] REPLACEMENT  2004   left knee replacement    KNEE SURGERY     total of 6 surgeries on both knees    REPAIR PERONEAL TENDONS ANKLE     RIGHT/LEFT HEART CATH AND CORONARY ANGIOGRAPHY N/A 07/19/2022   Procedure: RIGHT/LEFT HEART CATH AND CORONARY ANGIOGRAPHY;  Surgeon: Burnell Blanks, MD;  Location: [facility];  Service: Cardiovascular;  Laterality: N/A;   TUBAL LIGATION     TUMOR REMOVAL     on pelvic bone     Family History  Problem Relation Age of Onset   Kidney disease Mother    Osteoporosis Mother    Heart disease Father    Cancer  Father        lymphoma   Osteoporosis Father    Healthy Brother    Cancer Maternal Grandmother        lymphoma   Breast cancer Maternal Grandmother    Cancer Paternal Grandmother        breast   Breast cancer Paternal Aunt    Hodgkin's lymphoma Grandchild      Social History   Tobacco Use  Smoking Status Never  Smokeless Tobacco Never    Social History   Substance and Sexual Activity  Alcohol Use No     Allergies  Allergen Reactions   Reglan [Metoclopramide] Other (See Comments)    Tremor    Estrogens Rash    Topical patch caused the rash    Current Outpatient Medications  Medication Sig Dispense Refill   amLODipine (NORVASC) 10 MG tablet TAKE 1 TABLET BY MOUTH EVERY DAY 90 tablet 1   aspirin EC 81 MG tablet Take 1 tablet (81 mg total) by mouth daily. Swallow whole. 90 tablet 3   Blood Glucose Monitoring Suppl (ONE TOUCH ULTRA 2) w/Device KIT Pt uses glucometer to check sugars twice daily. Dx E11.9 1 each 0   empagliflozin (JARDIANCE) 25 MG TABS tablet Take by mouth daily.     gabapentin (NEURONTIN) 300 MG capsule TAKE 2 CAPSULES BY MOUTH 3 TIMES A DAY (Patient taking differently: Take 600 mg by mouth 2 (two) times daily.) 180 capsule 3   glipiZIDE (GLUCOTROL XL) 5 MG 24 hr tablet Take 1 tablet (5 mg total) by mouth daily with breakfast. 90 tablet 0   Lancets (ONETOUCH DELICA PLUS [id number]) MISC USE WHEN CHECKING BLOOD SUGAR AS DIRECTED 100 each 4   losartan (COZAAR) 50 MG tablet TAKE 2 TABLETS BY MOUTH EVERY DAY (Patient taking differently: Take 50 mg by mouth 2 (two) times daily.) 180 tablet 1   melatonin 5 MG TABS Take 5 mg by mouth at bedtime as needed (sleep).     meloxicam (MOBIC) 15 MG tablet TAKE 1 TABLET BY MOUTH EVERY DAY WITH A MEAL 30 tablet 0   metFORMIN (GLUCOPHAGE) 500 MG tablet TAKE 2 TABLETS BY MOUTH 2 TIMES DAILY WITH A MEAL. 360 tablet 1   omeprazole (PRILOSEC) 40 MG capsule Take 1 capsule (40 mg total) by mouth 2 (two) times daily. (Patient taking  differently: Take 40 mg by mouth daily.) 60 capsule 1   ONETOUCH ULTRA test strip CHECK BLOOD SUGAR 1-2 TIMES DAILY 100 strip 12   rosuvastatin (CRESTOR) 10 MG tablet Take 1 tablet (10 mg total) by mouth daily. 90 tablet 3   sitaGLIPtin (JANUVIA) 100 MG  tablet Take 100 mg by mouth daily.     metoprolol tartrate (LOPRESSOR) 25 MG tablet Take 1 tablet (25 mg total) by mouth as directed. Take 2 hours before your CT scans on 12/21 to slow heart rate 1 tablet 0   No current facility-administered medications for this visit.    ROS 14 point ROS reviewed and negative except as per HPI   PHYSICAL EXAMINATION: BP (!) 160/74   Pulse 84   Resp 20   Ht 5\' 5"  (1.651 m)   Wt 183 lb (83 kg)   SpO2 95%   BMI 30.45 kg/m   Gen: NAD Neuro: Alert and oriented CV: RRR systolic murmur Resp: Nonlaboured Abd: Soft, ntnd Extr: WWP  Diagnostic Studies & Laboratory data:     Recent Radiology Findings:   CT CORONARY MORPH W/CTA COR W/SCORE W/CA W/CM &/OR WO/CM  Addendum Date: 08/05/2022   ADDENDUM REPORT: 08/05/2022 12:59 CLINICAL DATA:  Aortic valve replacement (TAVR), pre-op eval EXAM: Cardiac TAVR CT TECHNIQUE: The patient was scanned on a Siemens Force 277 slice scanner. A 120 kV retrospective scan was triggered in the descending thoracic aorta at 111 HU's. Gantry rotation speed was 270 msecs and collimation was .9 mm. The 3D data set was reconstructed in 5% intervals of the R-R cycle. Systolic and diastolic phases were analyzed on a dedicated work station using MPR, MIP and VRT modes. The patient received 72mL OMNIPAQUE IOHEXOL 350 MG/ML SOLN of contrast. FINDINGS: Aortic Valve: Tricuspid aortic valve with severely reduced cusp excursion. Severely thickened and severely calcified aortic valve cusps. AV calcium score: 1732 Virtual Basal Annulus Measurements: Maximum/Minimum Diameter: 25.1 x 20.4 mm Perimeter: 70.2 mm Area:  375 mm2 No significant LVOT calcifications. Membranous septal length: 2.5 mm Based  on these measurements, the annulus would be suitable for a 23 mm Sapien valve. Alternatively, Heart Team can consider 26 mm Evolut valve. Borderline sinus of Valsalva diameter for 26 mm valve, recommend valve team discussion for valve selection. Sinus of Valsalva Measurements: Non-coronary:  29 mm Right - coronary:  26 mm Left - coronary:  29 mm Sinus of Valsalva Height: Left: 23.7 mm Right: 21.1 mm Aorta: Conventional 3 vessel branch pattern of aortic arch. Sinotubular Junction:  27 mm Ascending Thoracic Aorta:  31 mm Aortic Arch:  26 mm Descending Thoracic Aorta:  23 mm Coronary Artery Height above Annulus: Left main: 18.7 mm Right coronary: 16.6 mm Coronary Arteries: Normal coronary origin. Left dominance. The study was performed without use of NTG and insufficient for plaque evaluation. Coronary artery calcium seen left main and LAD, mild. Optimum Fluoroscopic Angle for Delivery: RAO 0 CRA 0 OTHER: Atria: Left atrial appendage: No thrombus. Mitral valve: Grossly normal, mild mitral annular calcifications. Pulmonary artery: Normal caliber main PA, mild dilation of branch pulmonary arteries. Pulmonary veins: Normal anatomy. IMPRESSION: 1. Tricuspid aortic valve with severely reduced cusp excursion. Severely thickened and severely calcified aortic valve cusps. 2.  Aortic valve calcium score: 1732 3. Annulus area: 375 mm2, suitable for 23 mm Sapien 3 valve. No significant LVOT calcifications. 4.  Sufficient coronary artery heights from annulus. 5.  Optimum fluoroscopic angle for delivery: RAO 0 CRA 0 Electronically Signed   By: Cherlynn Kaiser M.D.   On: 08/05/2022 12:59   Result Date: 08/05/2022 EXAM: OVER-READ INTERPRETATION  CT CHEST The following report is a limited chest CT over-read performed by radiologist Dr. Yetta Glassman of [facility], [place] on 08/02/2022. This over-read does not include interpretation of cardiac or coronary anatomy  or pathology. The cardiac TAVR interpretation by the  cardiologist is attached. COMPARISON:  None Available. FINDINGS: Extracardiac findings will be described separately under dictation for contemporaneously obtained CTA chest, abdomen and pelvis. IMPRESSION: Please see separate dictation for contemporaneously obtained CTA chest, abdomen and pelvis dated 08/02/2022 for full description of relevant extracardiac findings. Electronically Signed: By: Yetta Glassman M.D. On: 08/02/2022 14:05   CT ANGIO ABDOMEN PELVIS  W &/OR WO CONTRAST  Result Date: 08/02/2022 CLINICAL DATA:  Preop evaluation for TAVR EXAM: CT ANGIOGRAPHY CHEST, ABDOMEN AND PELVIS TECHNIQUE: Multidetector CT imaging through the chest, abdomen and pelvis was performed using the standard protocol during bolus administration of intravenous contrast. Multiplanar reconstructed images and MIPs were obtained and reviewed to evaluate the vascular anatomy. RADIATION DOSE REDUCTION: This exam was performed according to the departmental dose-optimization program which includes automated exposure control, adjustment of the mA and/or kV according to patient size and/or use of iterative reconstruction technique. CONTRAST:  44mL OMNIPAQUE IOHEXOL 350 MG/ML SOLN COMPARISON:  None Available. FINDINGS: CTA CHEST FINDINGS Cardiovascular: Normal heart size. No pericardial effusion aortic valve thickening and calcifications. Normal caliber thoracic aorta with moderate atherosclerotic disease. Standard three-vessel aortic arch with no significant stenosis. Coronary artery calcifications of the left main, lad and RCA. No suspicious filling defects of the central pulmonary arteries. Mediastinum/Nodes: Small hiatal hernia. No pathologically enlarged lymph nodes seen in the chest. Lungs/Pleura: Central airways are patent. No consolidation, pleural effusion or pneumothorax. Small solid pulmonary nodules, largest is a 3 mm nodule of the right middle lobe located on series 5, image 69. Musculoskeletal: No chest wall  abnormality. No acute or significant osseous findings. CTA ABDOMEN AND PELVIS FINDINGS Hepatobiliary: No focal liver abnormality is seen. Status post cholecystectomy. Mild prominence of the common bile duct and central intrahepatic bile ducts, not unexpected status post cholecystectomy. Pancreas: Unremarkable. No pancreatic ductal dilatation or surrounding inflammatory changes. Spleen: Normal in size without focal abnormality. Adrenals/Urinary Tract: Bilateral adrenal glands are unremarkable. No hydronephrosis or nephrolithiasis. Bladder is unremarkable. Stomach/Bowel: Stomach is within normal limits. Appendix appears normal. Diverticulosis. No evidence of bowel wall thickening, distention, or inflammatory changes. Vascular/lymphatic: Normal caliber abdominal aorta with no significant stenosis. No pathologically enlarged lymph nodes seen in the abdomen or pelvis. Reproductive: No adnexal masses. Other: No abdominal wall hernia or abnormality. No abdominopelvic ascites. Musculoskeletal: No acute or significant osseous findings. VASCULAR MEASUREMENTS PERTINENT TO TAVR: AORTA: Minimal Aortic Diameter -  10.6 mm Severity of Aortic Calcification-moderate RIGHT PELVIS: Right Common Iliac Artery - Minimal Diameter-8.2 mm Tortuosity-mild Calcification-mild Right External Iliac Artery - Minimal Diameter-7.5 mm Tortuosity-mild Calcification-none Right Common Femoral Artery - Minimal Diameter-7.0 mm Tortuosity-mild Calcification-mild LEFT PELVIS: Left Common Iliac Artery - Minimal Diameter-7.5 mm Tortuosity-mild Calcification-mild Left External Iliac Artery - Minimal Diameter-7.5 mm Tortuosity-mild Calcification-none Left Common Femoral Artery - Minimal Diameter-7.1 mm Tortuosity-mild Calcification-mild Review of the MIP images confirms the above findings. IMPRESSION: 1. Vascular findings and measurements pertinent to potential TAVR procedure, as detailed above. 2. Thickening and calcification of the aortic valve, compatible  with reported clinical history of aortic stenosis. 3. Mild-to-moderate aortoiliac atherosclerosis. Left main and 2 vessel coronary artery disease. 4. Small solid pulmonary nodules, largest is a 3 mm nodule of the right middle lobe. No follow-up needed if patient is low-risk (and has no known or suspected primary neoplasm). Non-contrast chest CT can be considered in 12 months if patient is high-risk. This recommendation follows the consensus statement: Guidelines for Management of Incidental Pulmonary Nodules Detected on  CT Images: From the Fleischner Society 2017; Radiology 2017; [phone]. Electronically Signed   By: Yetta Glassman M.D.   On: 08/02/2022 14:05   CT ANGIO CHEST AORTA W/CM & OR WO/CM  Result Date: 08/02/2022 CLINICAL DATA:  Preop evaluation for TAVR EXAM: CT ANGIOGRAPHY CHEST, ABDOMEN AND PELVIS TECHNIQUE: Multidetector CT imaging through the chest, abdomen and pelvis was performed using the standard protocol during bolus administration of intravenous contrast. Multiplanar reconstructed images and MIPs were obtained and reviewed to evaluate the vascular anatomy. RADIATION DOSE REDUCTION: This exam was performed according to the departmental dose-optimization program which includes automated exposure control, adjustment of the mA and/or kV according to patient size and/or use of iterative reconstruction technique. CONTRAST:  53mL OMNIPAQUE IOHEXOL 350 MG/ML SOLN COMPARISON:  None Available. FINDINGS: CTA CHEST FINDINGS Cardiovascular: Normal heart size. No pericardial effusion aortic valve thickening and calcifications. Normal caliber thoracic aorta with moderate atherosclerotic disease. Standard three-vessel aortic arch with no significant stenosis. Coronary artery calcifications of the left main, lad and RCA. No suspicious filling defects of the central pulmonary arteries. Mediastinum/Nodes: Small hiatal hernia. No pathologically enlarged lymph nodes seen in the chest. Lungs/Pleura: Central  airways are patent. No consolidation, pleural effusion or pneumothorax. Small solid pulmonary nodules, largest is a 3 mm nodule of the right middle lobe located on series 5, image 69. Musculoskeletal: No chest wall abnormality. No acute or significant osseous findings. CTA ABDOMEN AND PELVIS FINDINGS Hepatobiliary: No focal liver abnormality is seen. Status post cholecystectomy. Mild prominence of the common bile duct and central intrahepatic bile ducts, not unexpected status post cholecystectomy. Pancreas: Unremarkable. No pancreatic ductal dilatation or surrounding inflammatory changes. Spleen: Normal in size without focal abnormality. Adrenals/Urinary Tract: Bilateral adrenal glands are unremarkable. No hydronephrosis or nephrolithiasis. Bladder is unremarkable. Stomach/Bowel: Stomach is within normal limits. Appendix appears normal. Diverticulosis. No evidence of bowel wall thickening, distention, or inflammatory changes. Vascular/lymphatic: Normal caliber abdominal aorta with no significant stenosis. No pathologically enlarged lymph nodes seen in the abdomen or pelvis. Reproductive: No adnexal masses. Other: No abdominal wall hernia or abnormality. No abdominopelvic ascites. Musculoskeletal: No acute or significant osseous findings. VASCULAR MEASUREMENTS PERTINENT TO TAVR: AORTA: Minimal Aortic Diameter -  10.6 mm Severity of Aortic Calcification-moderate RIGHT PELVIS: Right Common Iliac Artery - Minimal Diameter-8.2 mm Tortuosity-mild Calcification-mild Right External Iliac Artery - Minimal Diameter-7.5 mm Tortuosity-mild Calcification-none Right Common Femoral Artery - Minimal Diameter-7.0 mm Tortuosity-mild Calcification-mild LEFT PELVIS: Left Common Iliac Artery - Minimal Diameter-7.5 mm Tortuosity-mild Calcification-mild Left External Iliac Artery - Minimal Diameter-7.5 mm Tortuosity-mild Calcification-none Left Common Femoral Artery - Minimal Diameter-7.1 mm Tortuosity-mild Calcification-mild Review of  the MIP images confirms the above findings. IMPRESSION: 1. Vascular findings and measurements pertinent to potential TAVR procedure, as detailed above. 2. Thickening and calcification of the aortic valve, compatible with reported clinical history of aortic stenosis. 3. Mild-to-moderate aortoiliac atherosclerosis. Left main and 2 vessel coronary artery disease. 4. Small solid pulmonary nodules, largest is a 3 mm nodule of the right middle lobe. No follow-up needed if patient is low-risk (and has no known or suspected primary neoplasm). Non-contrast chest CT can be considered in 12 months if patient is high-risk. This recommendation follows the consensus statement: Guidelines for Management of Incidental Pulmonary Nodules Detected on CT Images: From the Fleischner Society 2017; Radiology 2017; 284:228-243. Electronically Signed   By: Yetta Glassman M.D.   On: 08/02/2022 14:05       I have independently reviewed the above radiology studies  and reviewed the  findings with the patient.   Recent Lab Findings: Lab Results  Component Value Date   WBC 8.4 07/16/2022   HGB 11.6 (L) 07/19/2022   HGB 9.2 (L) 07/19/2022   HCT 34.0 (L) 07/19/2022   HCT 27.0 (L) 07/19/2022   PLT 204 07/16/2022   GLUCOSE 119 (H) 07/31/2022   CHOL 111 04/30/2022   TRIG 146.0 04/30/2022   HDL 48.00 04/30/2022   LDLDIRECT 104.0 04/08/2017   LDLCALC 34 04/30/2022   ALT 30 04/30/2022   AST 25 04/30/2022   NA 133 (L) 07/31/2022   K 4.6 07/31/2022   CL 99 07/31/2022   CREATININE 0.85 07/31/2022   BUN 21 07/31/2022   CO2 25 07/31/2022   TSH 1.99 04/30/2022   HGBA1C 7.6 (H) 07/31/2022     Assessment / Plan:   37F with symptomatic severe AS.  Meets criteria for AV replacement.  Valve: Sapien 23 recommended.  A 26 Evolut needs 69mm SOV and this patient has 64mm SOV. That's doable but close.  Bailout: yes Access: Left transfemoral, the right is OK diameter but higher.  NYHALezlie Octave 08/21/2022 6:20  PM

## 2022-08-24 ENCOUNTER — Telehealth: Payer: Self-pay | Admitting: Cardiovascular Disease

## 2022-08-24 ENCOUNTER — Ambulatory Visit (HOSPITAL_COMMUNITY)
Admission: RE | Admit: 2022-08-24 | Discharge: 2022-08-24 | Disposition: A | Discharge status: Home / Self Care | Payer: PPO | Source: Ambulatory Visit | Attending: Cardiovascular Disease | Admitting: Cardiovascular Disease

## 2022-08-24 ENCOUNTER — Encounter (HOSPITAL_COMMUNITY)
Admission: RE | Admit: 2022-08-24 | Discharge: 2022-08-24 | Disposition: A | Discharge status: Home / Self Care | Payer: PPO | Source: Ambulatory Visit | Attending: Cardiovascular Disease | Admitting: Cardiovascular Disease

## 2022-08-24 DIAGNOSIS — Z01818 Encounter for other preprocedural examination: Secondary | ICD-10-CM | POA: Diagnosis not present

## 2022-08-24 DIAGNOSIS — E1140 Type 2 diabetes mellitus with diabetic neuropathy, unspecified: Secondary | ICD-10-CM | POA: Diagnosis not present

## 2022-08-24 DIAGNOSIS — Z1152 Encounter for screening for COVID-19: Secondary | ICD-10-CM | POA: Insufficient documentation

## 2022-08-24 DIAGNOSIS — I350 Nonrheumatic aortic (valve) stenosis: Secondary | ICD-10-CM

## 2022-08-24 LAB — COMPREHENSIVE METABOLIC PANEL
ALT: 32 U/L (ref 0–44)
AST: 44 U/L — ABNORMAL HIGH (ref 15–41)
Albumin: 4 g/dL (ref 3.5–5.0)
Alkaline Phosphatase: 46 U/L (ref 38–126)
Anion gap: 8 (ref 5–15)
BUN: 14 mg/dL (ref 8–23)
CO2: 24 mmol/L (ref 22–32)
Calcium: 9.4 mg/dL (ref 8.9–10.3)
Chloride: 102 mmol/L (ref 98–111)
Creatinine, Ser: 0.93 mg/dL (ref 0.44–1.00)
GFR, Estimated: >60 mL/min (ref >60)
Glucose, Bld: 194 mg/dL — ABNORMAL HIGH (ref 70–99)
Potassium: 4.4 mmol/L (ref 3.5–5.1)
Sodium: 134 mmol/L — ABNORMAL LOW (ref 135–145)
Total Bilirubin: 0.4 mg/dL (ref 0.3–1.2)
Total Protein: 8 g/dL (ref 6.5–8.1)

## 2022-08-24 LAB — CBC
HCT: 34.8 % — ABNORMAL LOW (ref 36.0–46.0)
Hemoglobin: 11.1 g/dL — ABNORMAL LOW (ref 12.0–15.0)
MCH: 26.1 pg (ref 26.0–34.0)
MCHC: 31.9 g/dL (ref 30.0–36.0)
MCV: 81.9 fL (ref 80.0–100.0)
Platelets: 219 10*3/uL (ref 150–400)
RBC: 4.25 MIL/uL (ref 3.87–5.11)
RDW: 14.3 % (ref 11.5–15.5)
WBC: 10.7 10*3/uL — ABNORMAL HIGH (ref 4.0–10.5)
nRBC: 0 % (ref 0.0–0.2)

## 2022-08-24 LAB — URINALYSIS, ROUTINE W REFLEX MICROSCOPIC
Glucose, UA: >=500 mg/dL — AB
Hgb urine dipstick: NEGATIVE
Ketones, ur: NEGATIVE mg/dL
Leukocytes,Ua: NEGATIVE
Nitrite: NEGATIVE
Protein, ur: NEGATIVE mg/dL
Specific Gravity, Urine: <1.005 — ABNORMAL LOW (ref 1.005–1.030)
pH: 5.5 (ref 5.0–8.0)

## 2022-08-24 LAB — URINALYSIS, MICROSCOPIC (REFLEX)
RBC / HPF: NONE SEEN RBC/hpf (ref 0–5)
WBC, UA: NONE SEEN WBC/hpf (ref 0–5)

## 2022-08-24 LAB — PROTIME-INR
INR: 1.1 (ref 0.8–1.2)
Prothrombin Time: 14.2 seconds (ref 11.4–15.2)

## 2022-08-24 LAB — SURGICAL PCR SCREEN
MRSA, PCR: NEGATIVE
Staphylococcus aureus: POSITIVE — AB

## 2022-08-24 LAB — SARS CORONAVIRUS 2 (TAT 6-24 HRS): SARS Coronavirus 2: NEGATIVE

## 2022-08-24 NOTE — Telephone Encounter (Signed)
I spoke with Bonita Quin in the blood bank and she wanted to make Dr Excell Seltzer aware that the pt was positive for antibodies on T & S.  I advised to follow normal protocol of 2 Units of blood for TAVR. I will make Dr Excell Seltzer aware and contact Kendallyn if he has any additional orders.   Dr Excell Seltzer aware and no further orders for blood bank.

## 2022-08-24 NOTE — Progress Notes (Signed)
TAVR letter instructions reviewed with patient and all questions answered. CHG Soap given.

## 2022-08-24 NOTE — Telephone Encounter (Signed)
Karen Gonzales with the blood bank states they saw the patient and she just wants to ensure that labs are sufficient for procedure on 1/16 with Dr. Excell Seltzer. Please advise.

## 2022-08-26 NOTE — Assessment & Plan Note (Signed)
Chronic problem.  Pt is not entirely sure what dose of Glipizide or Jardiance she is taking.  She will message me to clarify.  Also on Metformin 1000mg  BID and Januvia 100mg  daily.  UTD on eye exam, foot exam, microalbumin.  Has known neuropathy but otherwise asymptomatic.  Check labs, clarify medication regimen, and adjust meds prn.

## 2022-08-27 MED ORDER — POTASSIUM CHLORIDE 2 MEQ/ML IV SOLN
80.0000 meq | INTRAVENOUS | Status: DC
  Filled 2022-08-27: qty 40

## 2022-08-27 MED ORDER — MAGNESIUM SULFATE 50 % IJ SOLN
40.0000 meq | INTRAMUSCULAR | Status: DC
  Filled 2022-08-27: qty 9.85

## 2022-08-27 MED ORDER — CEFAZOLIN SODIUM-DEXTROSE 2-4 GM/100ML-% IV SOLN
2.0000 g | INTRAVENOUS | Status: AC
  Administered 2022-08-28: 2 g via INTRAVENOUS
  Filled 2022-08-27: qty 100

## 2022-08-27 MED ORDER — DEXMEDETOMIDINE HCL IN NACL 400 MCG/100ML IV SOLN
0.1000 ug/kg/h | INTRAVENOUS | Status: AC
  Administered 2022-08-28: 6 ug/kg/h via INTRAVENOUS
  Filled 2022-08-27: qty 100

## 2022-08-27 MED ORDER — HEPARIN 30,000 UNITS/1000 ML (OHS) CELLSAVER SOLUTION
Status: DC
  Filled 2022-08-27: qty 1000

## 2022-08-27 MED ORDER — NOREPINEPHRINE 4 MG/250ML-% IV SOLN
0.0000 ug/min | INTRAVENOUS | Status: AC
  Administered 2022-08-28: 1 ug/min via INTRAVENOUS
  Filled 2022-08-27: qty 250

## 2022-08-28 ENCOUNTER — Other Ambulatory Visit: Payer: Self-pay | Admitting: Physician Assistant

## 2022-08-28 ENCOUNTER — Other Ambulatory Visit: Payer: Self-pay

## 2022-08-28 ENCOUNTER — Encounter (HOSPITAL_COMMUNITY): Payer: Self-pay | Admitting: Cardiovascular Disease

## 2022-08-28 ENCOUNTER — Encounter (HOSPITAL_COMMUNITY)
Admission: RE | Disposition: A | Discharge status: Home / Self Care | Payer: Self-pay | Source: Home / Self Care | Attending: Cardiovascular Disease

## 2022-08-28 ENCOUNTER — Inpatient Hospital Stay (HOSPITAL_COMMUNITY): Payer: PPO

## 2022-08-28 ENCOUNTER — Inpatient Hospital Stay (HOSPITAL_COMMUNITY): Payer: PPO | Admitting: Physician Assistant

## 2022-08-28 ENCOUNTER — Inpatient Hospital Stay (HOSPITAL_COMMUNITY)
Admission: RE | Admit: 2022-08-28 | Discharge: 2022-08-29 | DRG: 267 | Disposition: A | Discharge status: Home / Self Care | Payer: PPO | Attending: Cardiovascular Disease | Admitting: Cardiovascular Disease

## 2022-08-28 ENCOUNTER — Other Ambulatory Visit (HOSPITAL_COMMUNITY): Payer: PPO

## 2022-08-28 DIAGNOSIS — Z803 Family history of malignant neoplasm of breast: Secondary | ICD-10-CM | POA: Diagnosis not present

## 2022-08-28 DIAGNOSIS — K3184 Gastroparesis: Secondary | ICD-10-CM | POA: Diagnosis present

## 2022-08-28 DIAGNOSIS — E1151 Type 2 diabetes mellitus with diabetic peripheral angiopathy without gangrene: Secondary | ICD-10-CM | POA: Diagnosis present

## 2022-08-28 DIAGNOSIS — E1169 Type 2 diabetes mellitus with other specified complication: Secondary | ICD-10-CM | POA: Diagnosis present

## 2022-08-28 DIAGNOSIS — Z7982 Long term (current) use of aspirin: Secondary | ICD-10-CM

## 2022-08-28 DIAGNOSIS — Z8616 Personal history of COVID-19: Secondary | ICD-10-CM | POA: Diagnosis not present

## 2022-08-28 DIAGNOSIS — E782 Mixed hyperlipidemia: Secondary | ICD-10-CM | POA: Diagnosis not present

## 2022-08-28 DIAGNOSIS — Z952 Presence of prosthetic heart valve: Principal | ICD-10-CM

## 2022-08-28 DIAGNOSIS — Z807 Family history of other malignant neoplasms of lymphoid, hematopoietic and related tissues: Secondary | ICD-10-CM | POA: Diagnosis not present

## 2022-08-28 DIAGNOSIS — I350 Nonrheumatic aortic (valve) stenosis: Secondary | ICD-10-CM

## 2022-08-28 DIAGNOSIS — K219 Gastro-esophageal reflux disease without esophagitis: Secondary | ICD-10-CM | POA: Diagnosis present

## 2022-08-28 DIAGNOSIS — I872 Venous insufficiency (chronic) (peripheral): Secondary | ICD-10-CM | POA: Diagnosis present

## 2022-08-28 DIAGNOSIS — E871 Hypo-osmolality and hyponatremia: Secondary | ICD-10-CM | POA: Diagnosis present

## 2022-08-28 DIAGNOSIS — E669 Obesity, unspecified: Secondary | ICD-10-CM | POA: Diagnosis not present

## 2022-08-28 DIAGNOSIS — K7581 Nonalcoholic steatohepatitis (NASH): Secondary | ICD-10-CM | POA: Diagnosis not present

## 2022-08-28 DIAGNOSIS — K449 Diaphragmatic hernia without obstruction or gangrene: Secondary | ICD-10-CM | POA: Diagnosis present

## 2022-08-28 DIAGNOSIS — M797 Fibromyalgia: Secondary | ICD-10-CM | POA: Diagnosis present

## 2022-08-28 DIAGNOSIS — Z86718 Personal history of other venous thrombosis and embolism: Secondary | ICD-10-CM

## 2022-08-28 DIAGNOSIS — Z7984 Long term (current) use of oral hypoglycemic drugs: Secondary | ICD-10-CM | POA: Diagnosis not present

## 2022-08-28 DIAGNOSIS — D6861 Antiphospholipid syndrome: Secondary | ICD-10-CM | POA: Diagnosis present

## 2022-08-28 DIAGNOSIS — I10 Essential (primary) hypertension: Secondary | ICD-10-CM

## 2022-08-28 DIAGNOSIS — D649 Anemia, unspecified: Secondary | ICD-10-CM | POA: Diagnosis not present

## 2022-08-28 DIAGNOSIS — Z8262 Family history of osteoporosis: Secondary | ICD-10-CM

## 2022-08-28 DIAGNOSIS — E119 Type 2 diabetes mellitus without complications: Secondary | ICD-10-CM | POA: Diagnosis not present

## 2022-08-28 DIAGNOSIS — Z841 Family history of disorders of kidney and ureter: Secondary | ICD-10-CM | POA: Diagnosis not present

## 2022-08-28 DIAGNOSIS — Z006 Encounter for examination for normal comparison and control in clinical research program: Secondary | ICD-10-CM | POA: Diagnosis not present

## 2022-08-28 DIAGNOSIS — Z8249 Family history of ischemic heart disease and other diseases of the circulatory system: Secondary | ICD-10-CM

## 2022-08-28 DIAGNOSIS — Z6830 Body mass index (BMI) 30.0-30.9, adult: Secondary | ICD-10-CM

## 2022-08-28 DIAGNOSIS — Z79899 Other long term (current) drug therapy: Secondary | ICD-10-CM

## 2022-08-28 DIAGNOSIS — Z888 Allergy status to other drugs, medicaments and biological substances status: Secondary | ICD-10-CM

## 2022-08-28 DIAGNOSIS — Z791 Long term (current) use of non-steroidal anti-inflammatories (NSAID): Secondary | ICD-10-CM

## 2022-08-28 DIAGNOSIS — I447 Left bundle-branch block, unspecified: Secondary | ICD-10-CM | POA: Diagnosis not present

## 2022-08-28 DIAGNOSIS — K760 Fatty (change of) liver, not elsewhere classified: Secondary | ICD-10-CM | POA: Diagnosis present

## 2022-08-28 DIAGNOSIS — D509 Iron deficiency anemia, unspecified: Secondary | ICD-10-CM | POA: Diagnosis present

## 2022-08-28 DIAGNOSIS — M8580 Other specified disorders of bone density and structure, unspecified site: Secondary | ICD-10-CM | POA: Diagnosis not present

## 2022-08-28 DIAGNOSIS — E1143 Type 2 diabetes mellitus with diabetic autonomic (poly)neuropathy: Secondary | ICD-10-CM | POA: Diagnosis present

## 2022-08-28 DIAGNOSIS — E1140 Type 2 diabetes mellitus with diabetic neuropathy, unspecified: Secondary | ICD-10-CM | POA: Diagnosis present

## 2022-08-28 HISTORY — PX: INTRAOPERATIVE TRANSTHORACIC ECHOCARDIOGRAM: SHX6523

## 2022-08-28 HISTORY — DX: Presence of prosthetic heart valve: Z95.2

## 2022-08-28 HISTORY — PX: TRANSCATHETER AORTIC VALVE REPLACEMENT, TRANSFEMORAL: SHX6400

## 2022-08-28 LAB — POCT I-STAT, CHEM 8
BUN: 16 mg/dL (ref 8–23)
BUN: 17 mg/dL (ref 8–23)
Calcium, Ion: 1.29 mmol/L (ref 1.15–1.40)
Calcium, Ion: 1.3 mmol/L (ref 1.15–1.40)
Chloride: 103 mmol/L (ref 98–111)
Chloride: 103 mmol/L (ref 98–111)
Creatinine, Ser: 0.6 mg/dL (ref 0.44–1.00)
Creatinine, Ser: 0.6 mg/dL (ref 0.44–1.00)
Glucose, Bld: 220 mg/dL — ABNORMAL HIGH (ref 70–99)
Glucose, Bld: 217 mg/dL — ABNORMAL HIGH (ref 70–99)
HCT: 28 % — ABNORMAL LOW (ref 36.0–46.0)
HCT: 28 % — ABNORMAL LOW (ref 36.0–46.0)
Hemoglobin: 9.5 g/dL — ABNORMAL LOW (ref 12.0–15.0)
Hemoglobin: 9.5 g/dL — ABNORMAL LOW (ref 12.0–15.0)
Potassium: 5.1 mmol/L (ref 3.5–5.1)
Potassium: 5.1 mmol/L (ref 3.5–5.1)
Sodium: 136 mmol/L (ref 135–145)
Sodium: 137 mmol/L (ref 135–145)
TCO2: 28 mmol/L (ref 22–32)
TCO2: 24 mmol/L (ref 22–32)

## 2022-08-28 LAB — POCT I-STAT 7, (LYTES, BLD GAS, ICA,H+H)
Acid-base deficit: 2 mmol/L (ref 0.0–2.0)
Bicarbonate: 23.5 mmol/L (ref 20.0–28.0)
Calcium, Ion: 1.28 mmol/L (ref 1.15–1.40)
HCT: 30 % — ABNORMAL LOW (ref 36.0–46.0)
Hemoglobin: 10.2 g/dL — ABNORMAL LOW (ref 12.0–15.0)
O2 Saturation: 100 %
Potassium: 4.9 mmol/L (ref 3.5–5.1)
Sodium: 137 mmol/L (ref 135–145)
TCO2: 25 mmol/L (ref 22–32)
pCO2 arterial: 44.3 mmHg (ref 32–48)
pH, Arterial: 7.333 — ABNORMAL LOW (ref 7.35–7.45)
pO2, Arterial: 422 mmHg — ABNORMAL HIGH (ref 83–108)

## 2022-08-28 LAB — ECHOCARDIOGRAM LIMITED
AR max vel: 1.97 cm2
AV Area VTI: 2.14 cm2
AV Area mean vel: 2.05 cm2
AV Mean grad: 7 mmHg
AV Peak grad: 12.3 mmHg
Ao pk vel: 1.75 m/s

## 2022-08-28 LAB — TYPE AND SCREEN
ABO/RH(D): A NEG
Antibody Screen: POSITIVE

## 2022-08-28 LAB — GLUCOSE, CAPILLARY
Glucose-Capillary: 131 mg/dL — ABNORMAL HIGH (ref 70–99)
Glucose-Capillary: 151 mg/dL — ABNORMAL HIGH (ref 70–99)
Glucose-Capillary: 137 mg/dL — ABNORMAL HIGH (ref 70–99)

## 2022-08-28 SURGERY — IMPLANTATION, AORTIC VALVE, TRANSCATHETER, FEMORAL APPROACH
Anesthesia: Monitor Anesthesia Care | Site: Chest

## 2022-08-28 MED ORDER — ONDANSETRON HCL 4 MG/2ML IJ SOLN
INTRAMUSCULAR | Status: DC | PRN
  Administered 2022-08-28: 4 mg via INTRAVENOUS

## 2022-08-28 MED ORDER — 0.9 % SODIUM CHLORIDE (POUR BTL) OPTIME
TOPICAL | Status: DC | PRN
  Administered 2022-08-28: 1000 mL

## 2022-08-28 MED ORDER — ORAL CARE MOUTH RINSE: 15.0000 mL | Freq: Once | OROMUCOSAL | Status: AC

## 2022-08-28 MED ORDER — MELOXICAM 7.5 MG PO TABS
15.0000 mg | ORAL_TABLET | Freq: Every day | ORAL | Status: DC
  Administered 2022-08-29: 15 mg via ORAL
  Filled 2022-08-28: qty 2

## 2022-08-28 MED ORDER — SODIUM CHLORIDE 0.9 % IV SOLN: INTRAVENOUS | Status: DC

## 2022-08-28 MED ORDER — CHLORHEXIDINE GLUCONATE 0.12 % MT SOLN: 15.0000 mL | Freq: Once | OROMUCOSAL | Status: DC

## 2022-08-28 MED ORDER — INSULIN ASPART 100 UNIT/ML IJ SOLN
0.0000 [IU] | Freq: Three times a day (TID) | INTRAMUSCULAR | Status: DC
  Administered 2022-08-28 – 2022-08-29 (×3): 2 [IU] via SUBCUTANEOUS

## 2022-08-28 MED ORDER — ONDANSETRON HCL 4 MG/2ML IJ SOLN
INTRAMUSCULAR | Status: AC
  Filled 2022-08-28: qty 2

## 2022-08-28 MED ORDER — PROTAMINE SULFATE 10 MG/ML IV SOLN
INTRAVENOUS | Status: DC | PRN
  Administered 2022-08-28: 50 mg via INTRAVENOUS

## 2022-08-28 MED ORDER — MELATONIN 5 MG PO TABS: 5.0000 mg | ORAL_TABLET | Freq: Every evening | ORAL | Status: DC | PRN

## 2022-08-28 MED ORDER — TRAMADOL HCL 50 MG PO TABS
50.0000 mg | ORAL_TABLET | ORAL | Status: DC | PRN
  Administered 2022-08-28: 50 mg via ORAL
  Filled 2022-08-28: qty 1

## 2022-08-28 MED ORDER — INSULIN ASPART 100 UNIT/ML IJ SOLN: 0.0000 [IU] | INTRAMUSCULAR | Status: DC | PRN

## 2022-08-28 MED ORDER — PANTOPRAZOLE SODIUM 40 MG PO TBEC
40.0000 mg | DELAYED_RELEASE_TABLET | Freq: Every day | ORAL | Status: DC
  Administered 2022-08-28 – 2022-08-29 (×2): 40 mg via ORAL
  Filled 2022-08-28 (×2): qty 1

## 2022-08-28 MED ORDER — ACETAMINOPHEN 650 MG RE SUPP: 650.0000 mg | Freq: Four times a day (QID) | RECTAL | Status: DC | PRN

## 2022-08-28 MED ORDER — ASPIRIN 81 MG PO TBEC
81.0000 mg | DELAYED_RELEASE_TABLET | Freq: Every day | ORAL | Status: DC
  Administered 2022-08-28 – 2022-08-29 (×2): 81 mg via ORAL
  Filled 2022-08-28 (×2): qty 1

## 2022-08-28 MED ORDER — SODIUM CHLORIDE 0.9% FLUSH
3.0000 mL | Freq: Two times a day (BID) | INTRAVENOUS | Status: DC
  Administered 2022-08-29: 3 mL via INTRAVENOUS

## 2022-08-28 MED ORDER — CHLORHEXIDINE GLUCONATE 4 % EX LIQD: 30.0000 mL | CUTANEOUS | Status: DC

## 2022-08-28 MED ORDER — CHLORHEXIDINE GLUCONATE 0.12 % MT SOLN
15.0000 mL | Freq: Once | OROMUCOSAL | Status: AC
  Administered 2022-08-28: 15 mL via OROMUCOSAL

## 2022-08-28 MED ORDER — LOSARTAN POTASSIUM 50 MG PO TABS
50.0000 mg | ORAL_TABLET | Freq: Two times a day (BID) | ORAL | Status: DC
  Administered 2022-08-28 – 2022-08-29 (×3): 50 mg via ORAL
  Filled 2022-08-28 (×3): qty 1

## 2022-08-28 MED ORDER — ONDANSETRON HCL 4 MG/2ML IJ SOLN: 4.0000 mg | Freq: Four times a day (QID) | INTRAMUSCULAR | Status: DC | PRN

## 2022-08-28 MED ORDER — CHLORHEXIDINE GLUCONATE 0.12 % MT SOLN
15.0000 mL | Freq: Once | OROMUCOSAL | Status: DC
  Filled 2022-08-28: qty 15

## 2022-08-28 MED ORDER — HEPARIN 6000 UNIT IRRIGATION SOLUTION
Status: DC | PRN
  Administered 2022-08-28 (×3): 1

## 2022-08-28 MED ORDER — FENTANYL CITRATE (PF) 250 MCG/5ML IJ SOLN
INTRAMUSCULAR | Status: AC
  Filled 2022-08-28: qty 5

## 2022-08-28 MED ORDER — CEFAZOLIN SODIUM-DEXTROSE 2-4 GM/100ML-% IV SOLN
2.0000 g | Freq: Three times a day (TID) | INTRAVENOUS | Status: AC
  Administered 2022-08-28 (×2): 2 g via INTRAVENOUS
  Filled 2022-08-28 (×2): qty 100

## 2022-08-28 MED ORDER — CHLORHEXIDINE GLUCONATE 4 % EX LIQD: 60.0000 mL | Freq: Once | CUTANEOUS | Status: DC

## 2022-08-28 MED ORDER — SODIUM CHLORIDE 0.9% FLUSH: 3.0000 mL | INTRAVENOUS | Status: DC | PRN

## 2022-08-28 MED ORDER — LACTATED RINGERS IV SOLN: INTRAVENOUS | Status: DC

## 2022-08-28 MED ORDER — PROPOFOL 500 MG/50ML IV EMUL
INTRAVENOUS | Status: DC | PRN
  Administered 2022-08-28: 50 ug/kg/min via INTRAVENOUS

## 2022-08-28 MED ORDER — PROPOFOL 10 MG/ML IV BOLUS
INTRAVENOUS | Status: AC
  Filled 2022-08-28: qty 20

## 2022-08-28 MED ORDER — LACTATED RINGERS IV SOLN: INTRAVENOUS | Status: DC | PRN

## 2022-08-28 MED ORDER — OXYCODONE HCL 5 MG PO TABS: 5.0000 mg | ORAL_TABLET | ORAL | Status: DC | PRN

## 2022-08-28 MED ORDER — HEPARIN SODIUM (PORCINE) 1000 UNIT/ML IJ SOLN
INTRAMUSCULAR | Status: AC
  Filled 2022-08-28: qty 1

## 2022-08-28 MED ORDER — LIDOCAINE HCL 1 % IJ SOLN
INTRAMUSCULAR | Status: DC | PRN
  Administered 2022-08-28: 10 mL via INTRADERMAL

## 2022-08-28 MED ORDER — AMLODIPINE BESYLATE 5 MG PO TABS
10.0000 mg | ORAL_TABLET | Freq: Every day | ORAL | Status: DC
  Administered 2022-08-28 – 2022-08-29 (×2): 10 mg via ORAL
  Filled 2022-08-28 (×2): qty 2

## 2022-08-28 MED ORDER — HEPARIN SODIUM (PORCINE) 1000 UNIT/ML IJ SOLN
INTRAMUSCULAR | Status: DC | PRN
  Administered 2022-08-28: 13000 [IU] via INTRAVENOUS

## 2022-08-28 MED ORDER — ACETAMINOPHEN 325 MG PO TABS
650.0000 mg | ORAL_TABLET | Freq: Four times a day (QID) | ORAL | Status: DC | PRN
  Administered 2022-08-29: 650 mg via ORAL
  Filled 2022-08-28: qty 2

## 2022-08-28 MED ORDER — IODIXANOL 320 MG/ML IV SOLN
INTRAVENOUS | Status: DC | PRN
  Administered 2022-08-28: 60 mL

## 2022-08-28 MED ORDER — NITROGLYCERIN IN D5W 200-5 MCG/ML-% IV SOLN: 0.0000 ug/min | INTRAVENOUS | Status: DC

## 2022-08-28 MED ORDER — EMPAGLIFLOZIN 25 MG PO TABS
25.0000 mg | ORAL_TABLET | Freq: Every day | ORAL | Status: DC
  Administered 2022-08-29: 25 mg via ORAL
  Filled 2022-08-28 (×2): qty 1

## 2022-08-28 MED ORDER — MORPHINE SULFATE (PF) 2 MG/ML IV SOLN: 1.0000 mg | INTRAVENOUS | Status: DC | PRN

## 2022-08-28 MED ORDER — ROSUVASTATIN CALCIUM 5 MG PO TABS
10.0000 mg | ORAL_TABLET | Freq: Every day | ORAL | Status: DC
  Administered 2022-08-28 – 2022-08-29 (×2): 10 mg via ORAL
  Filled 2022-08-28 (×2): qty 2

## 2022-08-28 MED ORDER — LIDOCAINE HCL 1 % IJ SOLN
INTRAMUSCULAR | Status: AC
  Filled 2022-08-28: qty 20

## 2022-08-28 MED ORDER — SODIUM CHLORIDE 0.9 % IV SOLN: INTRAVENOUS | Status: AC

## 2022-08-28 MED ORDER — GABAPENTIN 300 MG PO CAPS
600.0000 mg | ORAL_CAPSULE | Freq: Three times a day (TID) | ORAL | Status: DC
  Administered 2022-08-28 – 2022-08-29 (×3): 600 mg via ORAL
  Filled 2022-08-28 (×3): qty 2

## 2022-08-28 MED ORDER — HEPARIN 6000 UNIT IRRIGATION SOLUTION
Status: AC
  Filled 2022-08-28: qty 1500

## 2022-08-28 MED ORDER — FENTANYL CITRATE (PF) 250 MCG/5ML IJ SOLN
INTRAMUSCULAR | Status: DC | PRN
  Administered 2022-08-28: 50 ug via INTRAVENOUS

## 2022-08-28 MED ORDER — SODIUM CHLORIDE 0.9 % IV SOLN: 250.0000 mL | INTRAVENOUS | Status: DC | PRN

## 2022-08-28 SURGICAL SUPPLY — 73 items
ADH SKN CLS APL DERMABOND .7 (GAUZE/BANDAGES/DRESSINGS) ×2
APL PRP STRL LF DISP 70% ISPRP (MISCELLANEOUS) ×2
BAG COUNTER SPONGE SURGICOUNT (BAG) ×2 IMPLANT
BAG DECANTER FOR FLEXI CONT (MISCELLANEOUS) IMPLANT
BAG SNAP BAND KOVER 36X36 (MISCELLANEOUS) IMPLANT
BAG SPNG CNTER NS LX DISP (BAG) ×2
BLADE CLIPPER SURG (BLADE) IMPLANT
BLADE STERNUM SYSTEM 6 (BLADE) IMPLANT
CABLE ADAPT CONN TEMP 6FT (ADAPTER) ×2 IMPLANT
CABLE ADAPT PACING TEMP 12FT (ADAPTER) IMPLANT
CANISTER SUCT 3000ML PPV (MISCELLANEOUS) IMPLANT
CATH DIAG EXPO 6F AL1 (CATHETERS) IMPLANT
CATH DIAG EXPO 6F VENT PIG 145 (CATHETERS) ×4 IMPLANT
CATH INFINITI 6F AL2 (CATHETERS) IMPLANT
CATH S G BIP PACING (CATHETERS) ×2 IMPLANT
CHLORAPREP W/TINT 26 (MISCELLANEOUS) ×2 IMPLANT
CLOSURE MYNX CONTROL 6F/7F (Vascular Products) IMPLANT
CLOSURE PERCLOSE PROSTYLE (VASCULAR PRODUCTS) IMPLANT
CNTNR URN SCR LID CUP LEK RST (MISCELLANEOUS) ×4 IMPLANT
CONT SPEC 4OZ STRL OR WHT (MISCELLANEOUS) ×4
COVER BACK TABLE 80X110 HD (DRAPES) IMPLANT
DERMABOND ADVANCED .7 DNX12 (GAUZE/BANDAGES/DRESSINGS) ×2 IMPLANT
DEVICE CLOSURE PERCLS PRGLD 6F (VASCULAR PRODUCTS) ×4 IMPLANT
DRSG TEGADERM 4X4.75 (GAUZE/BANDAGES/DRESSINGS) ×4 IMPLANT
ELECT REM PT RETURN 9FT ADLT (ELECTROSURGICAL) ×2
ELECTRODE REM PT RTRN 9FT ADLT (ELECTROSURGICAL) ×2 IMPLANT
GAUZE SPONGE 4X4 12PLY STRL (GAUZE/BANDAGES/DRESSINGS) ×2 IMPLANT
GLOVE BIO SURGEON STRL SZ7.5 (GLOVE) ×2 IMPLANT
GLOVE BIO SURGEON STRL SZ8 (GLOVE) ×2 IMPLANT
GLOVE ECLIPSE 7.0 STRL STRAW (GLOVE) ×2 IMPLANT
GOWN STRL REUS W/ TWL LRG LVL3 (GOWN DISPOSABLE) IMPLANT
GOWN STRL REUS W/ TWL XL LVL3 (GOWN DISPOSABLE) ×2 IMPLANT
GOWN STRL REUS W/TWL LRG LVL3 (GOWN DISPOSABLE)
GOWN STRL REUS W/TWL XL LVL3 (GOWN DISPOSABLE) ×4
GUIDEWIRE SAF TJ AMPL .035X180 (WIRE) ×2 IMPLANT
GUIDEWIRE SAFE TJ AMPLATZ EXST (WIRE) ×2 IMPLANT
KIT BASIN OR (CUSTOM PROCEDURE TRAY) ×2 IMPLANT
KIT HEART LEFT (KITS) ×2 IMPLANT
KIT MICROPUNCTURE NIT STIFF (SHEATH) IMPLANT
KIT NAMIC PS PRESSURIZED FLUID (KITS) IMPLANT
KIT SAPIAN 3 ULTRA RESILIA 23 (Valve) IMPLANT
KIT TURNOVER KIT B (KITS) ×2 IMPLANT
NS IRRIG 1000ML POUR BTL (IV SOLUTION) ×2 IMPLANT
PACK ENDO MINOR (CUSTOM PROCEDURE TRAY) ×2 IMPLANT
PAD ARMBOARD 7.5X6 YLW CONV (MISCELLANEOUS) ×4 IMPLANT
PAD ELECT DEFIB RADIOL ZOLL (MISCELLANEOUS) ×2 IMPLANT
PERCLOSE PROGLIDE 6F (VASCULAR PRODUCTS) ×4
POSITIONER HEAD DONUT 9IN (MISCELLANEOUS) ×2 IMPLANT
SET MICROPUNCTURE 5F STIFF (MISCELLANEOUS) ×2 IMPLANT
SHEATH BRITE TIP 7FR 35CM (SHEATH) ×2 IMPLANT
SHEATH PINNACLE 6F 10CM (SHEATH) ×2 IMPLANT
SHEATH PINNACLE 8F 10CM (SHEATH) ×2 IMPLANT
SHIELD RADPAD ABSORB 11X34 (MISCELLANEOUS) IMPLANT
SLEEVE REPOSITIONING LENGTH 30 (MISCELLANEOUS) ×2 IMPLANT
SPIKE FLUID TRANSFER (MISCELLANEOUS) ×2 IMPLANT
SPONGE T-LAP 18X18 ~~LOC~~+RFID (SPONGE) ×2 IMPLANT
STOPCOCK MORSE 400PSI 3WAY (MISCELLANEOUS) ×4 IMPLANT
SUT SILK  1 MH (SUTURE) ×2
SUT SILK 1 MH (SUTURE) ×2 IMPLANT
SYR 50ML LL SCALE MARK (SYRINGE) ×2 IMPLANT
SYR BULB IRRIG 60ML STRL (SYRINGE) IMPLANT
TOWEL GREEN STERILE (TOWEL DISPOSABLE) ×4 IMPLANT
TRANSDUCER W/STOPCOCK (MISCELLANEOUS) ×4 IMPLANT
TRAY FOLEY SLVR 14FR TEMP STAT (SET/KITS/TRAYS/PACK) IMPLANT
TUBE SUCT INTRACARD DLP 20F (MISCELLANEOUS) IMPLANT
TUBING ART PRESS 72  MALE/FEM (TUBING) ×2
TUBING ART PRESS 72 MALE/FEM (TUBING) IMPLANT
TUBING CIL FLEX 10 FLL-RA (TUBING) IMPLANT
WIRE AMPLATZ SS-J .035X180CM (WIRE) IMPLANT
WIRE EMERALD 3MM-J .035X150CM (WIRE) ×2 IMPLANT
WIRE EMERALD 3MM-J .035X260CM (WIRE) ×2 IMPLANT
WIRE EMERALD ST .035X260CM (WIRE) ×4 IMPLANT
WIRE SAFARI SM CURVE 275 (WIRE) ×2 IMPLANT

## 2022-08-28 NOTE — Op Note (Signed)
[facility]   MULTIDISCIPLINARY HEART VALVE TEAM   TAVR OPERATIVE NOTE   Date of Procedure:  08/28/2022  Preoperative Diagnosis: Severe Aortic Stenosis   Postoperative Diagnosis: Same   Procedure:   Transcatheter Aortic Valve Replacement - Percutaneous  Transfemoral Approach  Edwards Sapien 3 Ultra Resilia THV (size 23 mm, serial # [id number] )   Co-Surgeons:  Clifton James, MD and Tonny Bollman, MD  Anesthesiologist:  Marcene Duos, MD  Echocardiographer:  Charlton Haws, MD  Pre-operative Echo Findings: Severe aortic stenosis Normal left ventricular systolic function  Post-operative Echo Findings: No paravalvular leak Normal/unchanged left ventricular systolic function  BRIEF CLINICAL NOTE AND INDICATIONS FOR SURGERY  77 yo woman presenting for TAVR. The patient has a history of essential hypertension, mixed hyperlipidemia, type 2 diabetes, nonalcoholic steatohepatitis, anticardiolipin antibody syndrome, and progressive aortic stenosis.  The patient was diagnosed with moderate aortic stenosis earlier this year.  She was seen by Dr. Cristal Deer and a 22-month follow-up echocardiogram was recommended.  Recent echo study shows progressive aortic stenosis, now in the severe range.  She has a longstanding heart murmur that became more prominent in the past few years. She was referred for an echocardiogram in May 2023 and this demonstrated moderate aortic stenosis. As above, a 6 month follow-up echo was performed 06/29/2022 and shows normal LVEF of 65%, calcified and restricted opening of the aortic valve, with peak and mean gradients of 58 and 33 mmHg, respectively. The DI is 0.24, SVI 33, and calculated AVA is 0.7 square cm, all consistent with severe stage D3 paradoxical low flow low gradient AS. From a symptomatic perspective, she complains of progressive exertional dyspnea over the past 6 months, now limited by dyspnea with walking and light housework. She also complains  of occasional chest discomfort, lightheadedness, and she has experienced worsening fatigue. No syncope.  During the course of the patient's preoperative work up they have been evaluated comprehensively by a multidisciplinary team of specialists coordinated through the Multidisciplinary [facility]ve [facility] in the [facility].  They have been demonstrated to suffer from symptomatic severe aortic stenosis as noted above. The patient has been counseled extensively as to the relative risks and benefits of all options for the treatment of severe aortic stenosis including long term medical therapy, conventional surgery for aortic valve replacement, and transcatheter aortic valve replacement.  The patient has been independently evaluated in formal cardiac surgical consultation by Dr Laneta Simmers, who deemed the patient appropriate for TAVR. Based upon review of all of the patient's preoperative diagnostic tests they are felt to be candidate for transcatheter aortic valve replacement using the transfemoral approach as an alternative to conventional surgery.    Following the decision to proceed with transcatheter aortic valve replacement, a discussion has been held regarding what types of management strategies would be attempted intraoperatively in the event of life-threatening complications, including whether or not the patient would be considered a candidate for the use of cardiopulmonary bypass and/or conversion to open sternotomy for attempted surgical intervention.  The patient has been advised of a variety of complications that might develop peculiar to this approach including but not limited to risks of death, stroke, paravalvular leak, aortic dissection or other major vascular complications, aortic annulus rupture, device embolization, cardiac rupture or perforation, acute myocardial infarction, arrhythmia, heart block or bradycardia requiring permanent pacemaker placement, congestive heart  failure, respiratory failure, renal failure, pneumonia, infection, other late complications related to structural valve deterioration or migration, or other complications  that might ultimately cause a temporary or permanent loss of functional independence or other long term morbidity.  The patient provides full informed consent for the procedure as described and all questions were answered preoperatively.  DETAILS OF THE OPERATIVE PROCEDURE  PREPARATION:   The patient is brought to the operating room on the above mentioned date and central monitoring was established by the anesthesia team including placement of a radial arterial line. The patient is placed in the supine position on the operating table.  Intravenous antibiotics are administered. The patient is monitored closely throughout the procedure under conscious sedation.  Baseline transthoracic echocardiogram is performed. The patient's chest, abdomen, both groins, and both lower extremities are prepared and draped in a sterile manner. A time out procedure is performed.   PERIPHERAL ACCESS:   Using ultrasound guidance, femoral arterial and venous access is obtained with placement of 6 Fr sheaths on the left side.  [place] images are digitally captured and stored in the patient's chart. A pigtail diagnostic catheter was passed through the femoral arterial sheath under fluoroscopic guidance into the aortic root.  A temporary transvenous pacemaker catheter was passed through the femoral venous sheath under fluoroscopic guidance into the right ventricle.  The pacemaker was tested to ensure stable lead placement and pacemaker capture. Aortic root angiography was performed in order to determine the optimal angiographic angle for valve deployment.  TRANSFEMORAL ACCESS:  A micropuncture technique is used to access the right femoral artery under fluoroscopic and ultrasound guidance.  2 Perclose devices are deployed at 10' and 2' positions to 'PreClose' the  femoral artery. An 8 French sheath is placed and then an Amplatz Superstiff wire is advanced through the sheath. This is changed out for a 14 French transfemoral E-Sheath after progressively dilating over the Superstiff wire.  An AL-1 catheter was used to direct an exchange length wire across the native aortic valve into the left ventricle. This was exchanged out for a pigtail catheter and position was confirmed in the LV apex. Simultaneous LV and Ao pressures were recorded.  The pigtail catheter was exchanged for a Safari wire in the LV apex.    BALLOON AORTIC VALVULOPLASTY:  Not performed  TRANSCATHETER HEART VALVE DEPLOYMENT:  An Edwards Sapien 3 transcatheter heart valve (size 23 mm) was prepared and crimped per manufacturer's guidelines, and the proper orientation of the valve is confirmed on the [employer] delivery system. The valve was advanced through the introducer sheath using normal technique until in an appropriate position in the abdominal aorta beyond the sheath tip. The balloon was then retracted and using the fine-tuning wheel was centered on the valve. The valve was then advanced across the aortic arch using appropriate flexion of the catheter. The valve was carefully positioned across the aortic valve annulus. The Commander catheter was retracted using normal technique. Once final position of the valve has been confirmed by angiographic assessment, the valve is deployed while temporarily holding ventilation and during rapid ventricular pacing to maintain systolic blood pressure < 50 mmHg and pulse pressure < 10 mmHg. The balloon inflation is held for >3 seconds after reaching full deployment volume. Once the balloon has fully deflated the balloon is retracted into the ascending aorta and valve function is assessed using echocardiography. The patient's hemodynamic recovery following valve deployment is good.  The deployment balloon and guidewire are both removed. Echo demostrated  acceptable post-procedural gradients, stable mitral valve function, and no aortic insufficiency.    PROCEDURE COMPLETION:  The sheath was removed  and femoral artery closure is performed using the 2 previously deployed Perclose devices.  Protamine is administered once femoral arterial repair was complete. The site is clear with no evidence of bleeding or hematoma after the sutures are tightened. The temporary pacemaker and pigtail catheters are removed. Mynx closure is used for contralateral femoral arterial hemostasis for the 6 Fr sheath.  The patient tolerated the procedure well and is transported to the recovery area in stable condition. There were no immediate intraoperative complications. All sponge instrument and needle counts are verified correct at completion of the operation.   The patient received a total of 60 mL of intravenous contrast during the procedure.  CONCLUSION: Successful TAVR using a 23 mm Sapien 3 Ultra Resilia Valve. Mean gradient by echo post-deployment 7 mmHg, no PVL.   Tonny Bollman, MD 08/28/2022 10:50 AM

## 2022-08-28 NOTE — Anesthesia Procedure Notes (Signed)
Procedure Name: MAC Date/Time: 08/28/2022 7:34 AM  Performed by: Moshe Salisbury, CRNAPre-anesthesia Checklist: Patient identified, Emergency Drugs available, Suction available and Patient being monitored Patient Re-evaluated:Patient Re-evaluated prior to induction Oxygen Delivery Method: Nasal cannula Placement Confirmation: positive ETCO2 Dental Injury: Teeth and Oropharynx as per pre-operative assessment

## 2022-08-28 NOTE — Anesthesia Preprocedure Evaluation (Signed)
Anesthesia Evaluation  Patient identified by MRN, date of birth, ID band Patient awake    Reviewed: Allergy & Precautions, NPO status , Patient's Chart, lab work & pertinent test results  History of Anesthesia Complications (+) PONV and history of anesthetic complications  Airway Mallampati: II  TM Distance: >3 FB Neck ROM: Full    Dental   Pulmonary neg pulmonary ROS   breath sounds clear to auscultation       Cardiovascular hypertension, Pt. on medications and Pt. on home beta blockers + Valvular Problems/Murmurs AS  Rhythm:Regular Rate:Normal     Neuro/Psych negative neurological ROS     GI/Hepatic Neg liver ROS, hiatal hernia,GERD  ,,  Endo/Other  diabetes, Type 2, Oral Hypoglycemic Agents    Renal/GU negative Renal ROS     Musculoskeletal  (+) Arthritis ,  Fibromyalgia -  Abdominal   Peds  Hematology  (+) Blood dyscrasia, anemia   Anesthesia Other Findings   Reproductive/Obstetrics                             Anesthesia Physical Anesthesia Plan  ASA: 4  Anesthesia Plan: MAC   Post-op Pain Management: Tylenol PO (pre-op)*   Induction:   PONV Risk Score and Plan: 3 and Dexamethasone and Ondansetron  Airway Management Planned: Natural Airway and Simple Face Mask  Additional Equipment:   Intra-op Plan:   Post-operative Plan:   Informed Consent: I have reviewed the patients History and Physical, chart, labs and discussed the procedure including the risks, benefits and alternatives for the proposed anesthesia with the patient or authorized representative who has indicated his/her understanding and acceptance.       Plan Discussed with:   Anesthesia Plan Comments:        Anesthesia Quick Evaluation

## 2022-08-28 NOTE — Discharge Summary (Addendum)
Karen [facility] VALVE TEAM  Discharge Summary    Patient ID: Karen Gonzales MRN: [id number]; DOB: 04-16-1946  Admit date: 08/28/2022 Discharge date: 08/29/2022  Primary Care Provider: [name], MD  Primary Cardiologist: Buford Dresser, MD / Dr. Burt Knack & Dr. Tenny Craw (TAVR)  Discharge Diagnoses    Principal Problem:   S/P TAVR (transcatheter aortic valve replacement) Active Problems:   Essential hypertension   Fibromyalgia   Type 2 diabetes mellitus with diabetic neuropathy, without long-term current use of insulin (HCC)   Gastroparesis due to DM ([place])   Obesity (BMI 30-39.9)   Chronic venous insufficiency   Fatty liver   Gastro-esophageal reflux disease without esophagitis   Iron deficiency anemia   Nonrheumatic aortic valve stenosis   Anti-cardiolipin antibody syndrome (HCC)   Allergies Allergies  Allergen Reactions   Reglan [Metoclopramide] Other (See Comments)    Tremor    Estrogens Rash    Topical patch caused the rash    Diagnostic Studies/Procedures    TAVR OPERATIVE NOTE     Date of Procedure:                08/28/2022   Preoperative Diagnosis:      Severe Aortic Stenosis    Postoperative Diagnosis:    Same    Procedure:        Transcatheter Aortic Valve Replacement - Percutaneous  Transfemoral Approach             Edwards Sapien 3 Ultra Resilia THV (size 23 mm, serial # [id number] )              Co-Surgeons:                        Oswaldo Milian, MD and Sherren Mocha, MD   Anesthesiologist:                  Suzette Battiest, MD   Echocardiographer:              Jenkins Rouge, MD   Pre-operative Echo Findings: Severe aortic stenosis Normal left ventricular systolic function   Post-operative Echo Findings: No paravalvular leak Normal/unchanged left ventricular systolic function _____________    Echo 08/29/22: completed but pending formal read at the time of discharge   History of Present  Illness     Karen Gonzales is a 77 y.o. female with a history of obesity (BMI 29), HTN, HLD, DMT2, nonalcoholic steatohepatitis, anticardiolipin antibody syndrome, and progressive paradoxical LFLG aortic stenosis who presented to [facility] on 08/29/22 for planned TAVR.   The patient was diagnosed with moderate aortic stenosis earlier in 2023. Six month follow up echo 06/29/22 showed EF 65%, and severe progression to severe AS with mean grad 33 mmHg, peak grad 58 mmHg, AVA 0.7 cm2, DVI 0.24, SVI 33. L/RHC 07/19/22 showed no angiographic evidence of CAD and severe aortic stenosis (mean gradient 45 mmHg, peak to peak gradient 48 mmHg, AVA 0.78 cm2).   The patient has been evaluated by the multidisciplinary valve team and felt to have severe, symptomatic aortic stenosis and to be a suitable candidate for TAVR, which was set up for 08/28/22.   Hospital Course     Consultants: none   Severe AS: s/p successful TAVR with a 23 mm Edwards Sapien 3 Ultra Resilia THV via the TF approach on 08/28/22. Post operative echo completed but pending formal read. Groin sites are stable. ECG with NSR and no  high grade heart block. Continued on home Asprin 81mg  daily. Walked with cardiac rehab with no issues. Plan for discharge home today with close follow up in the office next week.   HTN: BP has been elevated. Resume home meds and follow   DMT2: treated with SSI while admitted. Resume home meds at discharge. Okay to resume Metformin after 48 hours after contrast dye exposure (1/18 PM)  Pulmonary nodules: pre TAVR CT showed "small solid pulmonary nodules, largest is a 3 mm nodule of the right middle lobe. No follow-up needed if patient is low-risk (and has no known or suspected primary neoplasm). Non-contrast chest CT can be considered in 12 months if patient is high-risk." This will be discussed in the outpatient setting.   _____________  Discharge Vitals Blood pressure (!) 166/54, pulse 77, temperature 98.5 F (36.9 C),  temperature source Oral, resp. rate 18, height 5\' 5"  (1.651 m), weight 83.1 kg, SpO2 92 %.  Filed Weights   08/28/22 0548 08/29/22 0433  Weight: 82.1 kg 83.1 kg     GEN: Well nourished, well developed, in no acute distress HEENT: normal Neck: no JVD or masses Cardiac: RRR; no murmurs, rubs, or gallops,no edema  Respiratory:  clear to auscultation bilaterally, normal work of breathing GI: soft, nontender, nondistended, + BS MS: no deformity or atrophy Skin: warm and dry, no rash.  Groin sites clear without hematoma or ecchymosis Neuro:  Alert and Oriented x 3, Strength and sensation are intact Psych: euthymic mood, full affect   Labs & Radiologic Studies    CBC Recent Labs    08/28/22 0933 08/29/22 0135  WBC  --  9.6  HGB 9.5* 9.7*  HCT 28.0* 30.8*  MCV  --  81.9  PLT  --  160   Basic Metabolic Panel Recent Labs    08/30/22 0933 08/29/22 0135  NA 137 132*  K 5.1 4.4  CL 103 101  CO2  --  22  GLUCOSE 217* 160*  BUN 17 17  CREATININE 0.60 0.84  CALCIUM  --  8.8*  MG  --  1.7   Liver Function Tests No results for input(s): "AST", "ALT", "ALKPHOS", "BILITOT", "PROT", "ALBUMIN" in the last 72 hours. No results for input(s): "LIPASE", "AMYLASE" in the last 72 hours. Cardiac Enzymes No results for input(s): "CKTOTAL", "CKMB", "CKMBINDEX", "TROPONINI" in the last 72 hours. BNP Invalid input(s): "POCBNP" D-Dimer No results for input(s): "DDIMER" in the last 72 hours. Hemoglobin A1C No results for input(s): "HGBA1C" in the last 72 hours. Fasting Lipid Panel No results for input(s): "CHOL", "HDL", "LDLCALC", "TRIG", "CHOLHDL", "LDLDIRECT" in the last 72 hours. Thyroid Function Tests No results for input(s): "TSH", "T4TOTAL", "T3FREE", "THYROIDAB" in the last 72 hours.  Invalid input(s): "FREET3" _____________  ECHOCARDIOGRAM LIMITED  Result Date: 08/28/2022    ECHOCARDIOGRAM LIMITED REPORT   Patient Name:   Karen Gonzales Date of Exam: 08/28/2022 Medical Rec #:   Karen Gonzales       Height:       65.0 in Accession #:    08/30/2022      Weight:       181.0 lb Date of Birth:  [birth year]      BSA:          1.896 m Patient Age:    76 years        BP:           173/66 mmHg Patient Gender: F  HR:           91 bpm. Exam Location:  Inpatient Procedure: Limited Echo, Color Doppler and Cardiac Doppler Indications:    Aortic Stenosis i35.0;  History:        Patient has prior history of Echocardiogram examinations, most                 recent 06/29/2022. Risk Factors:Hypertension, Diabetes and                 Dyslipidemia.  Sonographer:    Irving Burton Senior RDCS Referring Phys: [phone] Kairee Isa  Sonographer Comments: 79mm Edwards S3U TAVR Implanted IMPRESSIONS  1. Left ventricular ejection fraction, by estimation, is 60 to 65%. The left ventricle has normal function. The left ventricle has no regional wall motion abnormalities. There is mild left ventricular hypertrophy.  2. Right ventricular systolic function is normal. The right ventricular size is normal.  3. Left atrial size was moderately dilated.  4. The mitral valve is abnormal. Mild mitral valve regurgitation. No evidence of mitral stenosis. Moderate mitral annular calcification.  5. Pre TAVR: Calcified tri leaflet AV with restricted leaflet motion mean gradient 29 peak 48 mmHg AVA 0.62 cm2.         Post TAVR: well positioned 23 mm Sapien 3 valve mean gradient 7 peak 12 mmhg No PVL AVA 2.1 cm2. The aortic valve has been repaired/replaced. Aortic valve regurgitation is not visualized. No aortic stenosis is present.  6. The inferior vena cava is normal in size with greater than 50% respiratory variability, suggesting right atrial pressure of 3 mmHg. FINDINGS  Left Ventricle: Left ventricular ejection fraction, by estimation, is 60 to 65%. The left ventricle has normal function. The left ventricle has no regional wall motion abnormalities. The left ventricular internal cavity size was normal in size. There is  mild left  ventricular hypertrophy. Right Ventricle: The right ventricular size is normal. No increase in right ventricular wall thickness. Right ventricular systolic function is normal. Left Atrium: Left atrial size was moderately dilated. Right Atrium: Right atrial size was normal in size. Pericardium: There is no evidence of pericardial effusion. Mitral Valve: The mitral valve is abnormal. There is mild thickening of the mitral valve leaflet(s). There is mild calcification of the mitral valve leaflet(s). Moderate mitral annular calcification. Mild mitral valve regurgitation. No evidence of mitral  valve stenosis. Tricuspid Valve: The tricuspid valve is normal in structure. Tricuspid valve regurgitation is mild . No evidence of tricuspid stenosis. Aortic Valve: Pre TAVR: Calcified tri leaflet AV with restricted leaflet motion mean gradient 29 peak 48 mmHg AVA 0.62 cm2. Post TAVR: well positioned 23 mm Sapien 3 valve mean gradient 7 peak 12 mmhg No PVL AVA 2.1 cm2. The aortic valve has been repaired/replaced. Aortic valve regurgitation is not visualized. No aortic stenosis is present. Aortic valve mean gradient measures  7.0 mmHg. Aortic valve peak gradient measures 12.2 mmHg. Aortic valve area, by VTI measures 2.14 cm. Pulmonic Valve: The pulmonic valve was normal in structure. Pulmonic valve regurgitation is trivial. No evidence of pulmonic stenosis. Aorta: The aortic root is normal in size and structure. Venous: The inferior vena cava is normal in size with greater than 50% respiratory variability, suggesting right atrial pressure of 3 mmHg. IAS/Shunts: No atrial level shunt detected by color flow Doppler. Additional Comments: Spectral Doppler performed. Color Doppler performed.  LEFT VENTRICLE PLAX 2D LVOT diam:     2.00 cm LV SV:  79 LV SV Index:   42 LVOT Area:     3.14 cm  AORTIC VALVE AV Area (Vmax):    1.97 cm AV Area (Vmean):   2.05 cm AV Area (VTI):     2.14 cm AV Vmax:           175.00 cm/s AV Vmean:           123.000 cm/s AV VTI:            0.369 m AV Peak Grad:      12.2 mmHg AV Mean Grad:      7.0 mmHg LVOT Vmax:         110.00 cm/s LVOT Vmean:        80.100 cm/s LVOT VTI:          0.251 m LVOT/AV VTI ratio: 0.68  SHUNTS Systemic VTI:  0.25 m Systemic Diam: 2.00 cm Charlton Haws MD Electronically signed by Charlton Haws MD Signature Date/Time: 08/28/2022/9:26:03 AM    Final    Structural Heart Procedure  Result Date: 08/28/2022 See surgical note for result.  DG Chest 2 View  Result Date: 08/24/2022 CLINICAL DATA:  Preop.  Severe aortic stenosis. EXAM: CHEST - 2 VIEW COMPARISON:  06/27/2017 FINDINGS: The heart size and mediastinal contours are within normal limits. Both lungs are clear. The visualized skeletal structures are unremarkable. IMPRESSION: No active cardiopulmonary disease. Electronically Signed   By: Norva Pavlov M.D.   On: 08/24/2022 13:10   CT CORONARY MORPH W/CTA COR W/SCORE W/CA W/CM &/OR WO/CM  Addendum Date: 08/05/2022   ADDENDUM REPORT: 08/05/2022 12:59 CLINICAL DATA:  Aortic valve replacement (TAVR), pre-op eval EXAM: Cardiac TAVR CT TECHNIQUE: The patient was scanned on a Siemens Force 192 slice scanner. A 120 kV retrospective scan was triggered in the descending thoracic aorta at 111 HU's. Gantry rotation speed was 270 msecs and collimation was .9 mm. The 3D data set was reconstructed in 5% intervals of the R-R cycle. Systolic and diastolic phases were analyzed on a dedicated work station using MPR, MIP and VRT modes. The patient received 43mL OMNIPAQUE IOHEXOL 350 MG/ML SOLN of contrast. FINDINGS: Aortic Valve: Tricuspid aortic valve with severely reduced cusp excursion. Severely thickened and severely calcified aortic valve cusps. AV calcium score: 1732 Virtual Basal Annulus Measurements: Maximum/Minimum Diameter: 25.1 x 20.4 mm Perimeter: 70.2 mm Area:  375 mm2 No significant LVOT calcifications. Membranous septal length: 2.5 mm Based on these measurements, the annulus would  be suitable for a 23 mm Sapien valve. Alternatively, Heart Team can consider 26 mm Evolut valve. Borderline sinus of Valsalva diameter for 26 mm valve, recommend valve team discussion for valve selection. Sinus of Valsalva Measurements: Non-coronary:  29 mm Right - coronary:  26 mm Left - coronary:  29 mm Sinus of Valsalva Height: Left: 23.7 mm Right: 21.1 mm Aorta: Conventional 3 vessel branch pattern of aortic arch. Sinotubular Junction:  27 mm Ascending Thoracic Aorta:  31 mm Aortic Arch:  26 mm Descending Thoracic Aorta:  23 mm Coronary Artery Height above Annulus: Left main: 18.7 mm Right coronary: 16.6 mm Coronary Arteries: Normal coronary origin. Left dominance. The study was performed without use of NTG and insufficient for plaque evaluation. Coronary artery calcium seen left main and LAD, mild. Optimum Fluoroscopic Angle for Delivery: RAO 0 CRA 0 OTHER: Atria: Left atrial appendage: No thrombus. Mitral valve: Grossly normal, mild mitral annular calcifications. Pulmonary artery: Normal caliber main PA, mild dilation of branch pulmonary arteries. Pulmonary veins: Normal anatomy.  IMPRESSION: 1. Tricuspid aortic valve with severely reduced cusp excursion. Severely thickened and severely calcified aortic valve cusps. 2.  Aortic valve calcium score: 1732 3. Annulus area: 375 mm2, suitable for 23 mm Sapien 3 valve. No significant LVOT calcifications. 4.  Sufficient coronary artery heights from annulus. 5.  Optimum fluoroscopic angle for delivery: RAO 0 CRA 0 Electronically Signed   By: Cherlynn Kaiser M.D.   On: 08/05/2022 12:59   Result Date: 08/05/2022 EXAM: OVER-READ INTERPRETATION  CT CHEST The following report is a limited chest CT over-read performed by radiologist Dr. Yetta Glassman of [facility], [place] on 08/02/2022. This over-read does not include interpretation of cardiac or coronary anatomy or pathology. The cardiac TAVR interpretation by the cardiologist is attached. COMPARISON:  None  Available. FINDINGS: Extracardiac findings will be described separately under dictation for contemporaneously obtained CTA chest, abdomen and pelvis. IMPRESSION: Please see separate dictation for contemporaneously obtained CTA chest, abdomen and pelvis dated 08/02/2022 for full description of relevant extracardiac findings. Electronically Signed: By: Yetta Glassman M.D. On: 08/02/2022 14:05   CT ANGIO ABDOMEN PELVIS  W &/OR WO CONTRAST  Result Date: 08/02/2022 CLINICAL DATA:  Preop evaluation for TAVR EXAM: CT ANGIOGRAPHY CHEST, ABDOMEN AND PELVIS TECHNIQUE: Multidetector CT imaging through the chest, abdomen and pelvis was performed using the standard protocol during bolus administration of intravenous contrast. Multiplanar reconstructed images and MIPs were obtained and reviewed to evaluate the vascular anatomy. RADIATION DOSE REDUCTION: This exam was performed according to the departmental dose-optimization program which includes automated exposure control, adjustment of the mA and/or kV according to patient size and/or use of iterative reconstruction technique. CONTRAST:  30mL OMNIPAQUE IOHEXOL 350 MG/ML SOLN COMPARISON:  None Available. FINDINGS: CTA CHEST FINDINGS Cardiovascular: Normal heart size. No pericardial effusion aortic valve thickening and calcifications. Normal caliber thoracic aorta with moderate atherosclerotic disease. Standard three-vessel aortic arch with no significant stenosis. Coronary artery calcifications of the left main, lad and RCA. No suspicious filling defects of the central pulmonary arteries. Mediastinum/Nodes: Small hiatal hernia. No pathologically enlarged lymph nodes seen in the chest. Lungs/Pleura: Central airways are patent. No consolidation, pleural effusion or pneumothorax. Small solid pulmonary nodules, largest is a 3 mm nodule of the right middle lobe located on series 5, image 69. Musculoskeletal: No chest wall abnormality. No acute or significant osseous findings.  CTA ABDOMEN AND PELVIS FINDINGS Hepatobiliary: No focal liver abnormality is seen. Status post cholecystectomy. Mild prominence of the common bile duct and central intrahepatic bile ducts, not unexpected status post cholecystectomy. Pancreas: Unremarkable. No pancreatic ductal dilatation or surrounding inflammatory changes. Spleen: Normal in size without focal abnormality. Adrenals/Urinary Tract: Bilateral adrenal glands are unremarkable. No hydronephrosis or nephrolithiasis. Bladder is unremarkable. Stomach/Bowel: Stomach is within normal limits. Appendix appears normal. Diverticulosis. No evidence of bowel wall thickening, distention, or inflammatory changes. Vascular/lymphatic: Normal caliber abdominal aorta with no significant stenosis. No pathologically enlarged lymph nodes seen in the abdomen or pelvis. Reproductive: No adnexal masses. Other: No abdominal wall hernia or abnormality. No abdominopelvic ascites. Musculoskeletal: No acute or significant osseous findings. VASCULAR MEASUREMENTS PERTINENT TO TAVR: AORTA: Minimal Aortic Diameter -  10.6 mm Severity of Aortic Calcification-moderate RIGHT PELVIS: Right Common Iliac Artery - Minimal Diameter-8.2 mm Tortuosity-mild Calcification-mild Right External Iliac Artery - Minimal Diameter-7.5 mm Tortuosity-mild Calcification-none Right Common Femoral Artery - Minimal Diameter-7.0 mm Tortuosity-mild Calcification-mild LEFT PELVIS: Left Common Iliac Artery - Minimal Diameter-7.5 mm Tortuosity-mild Calcification-mild Left External Iliac Artery - Minimal Diameter-7.5 mm Tortuosity-mild Calcification-none Left Common Femoral Artery -  Minimal Diameter-7.1 mm Tortuosity-mild Calcification-mild Review of the MIP images confirms the above findings. IMPRESSION: 1. Vascular findings and measurements pertinent to potential TAVR procedure, as detailed above. 2. Thickening and calcification of the aortic valve, compatible with reported clinical history of aortic stenosis. 3.  Mild-to-moderate aortoiliac atherosclerosis. Left main and 2 vessel coronary artery disease. 4. Small solid pulmonary nodules, largest is a 3 mm nodule of the right middle lobe. No follow-up needed if patient is low-risk (and has no known or suspected primary neoplasm). Non-contrast chest CT can be considered in 12 months if patient is high-risk. This recommendation follows the consensus statement: Guidelines for Management of Incidental Pulmonary Nodules Detected on CT Images: From the Fleischner Society 2017; Radiology 2017; 284:228-243. Electronically Signed   By: Allegra Lai M.D.   On: 08/02/2022 14:05   CT ANGIO CHEST AORTA W/CM & OR WO/CM  Result Date: 08/02/2022 CLINICAL DATA:  Preop evaluation for TAVR EXAM: CT ANGIOGRAPHY CHEST, ABDOMEN AND PELVIS TECHNIQUE: Multidetector CT imaging through the chest, abdomen and pelvis was performed using the standard protocol during bolus administration of intravenous contrast. Multiplanar reconstructed images and MIPs were obtained and reviewed to evaluate the vascular anatomy. RADIATION DOSE REDUCTION: This exam was performed according to the departmental dose-optimization program which includes automated exposure control, adjustment of the mA and/or kV according to patient size and/or use of iterative reconstruction technique. CONTRAST:  52mL OMNIPAQUE IOHEXOL 350 MG/ML SOLN COMPARISON:  None Available. FINDINGS: CTA CHEST FINDINGS Cardiovascular: Normal heart size. No pericardial effusion aortic valve thickening and calcifications. Normal caliber thoracic aorta with moderate atherosclerotic disease. Standard three-vessel aortic arch with no significant stenosis. Coronary artery calcifications of the left main, lad and RCA. No suspicious filling defects of the central pulmonary arteries. Mediastinum/Nodes: Small hiatal hernia. No pathologically enlarged lymph nodes seen in the chest. Lungs/Pleura: Central airways are patent. No consolidation, pleural effusion  or pneumothorax. Small solid pulmonary nodules, largest is a 3 mm nodule of the right middle lobe located on series 5, image 69. Musculoskeletal: No chest wall abnormality. No acute or significant osseous findings. CTA ABDOMEN AND PELVIS FINDINGS Hepatobiliary: No focal liver abnormality is seen. Status post cholecystectomy. Mild prominence of the common bile duct and central intrahepatic bile ducts, not unexpected status post cholecystectomy. Pancreas: Unremarkable. No pancreatic ductal dilatation or surrounding inflammatory changes. Spleen: Normal in size without focal abnormality. Adrenals/Urinary Tract: Bilateral adrenal glands are unremarkable. No hydronephrosis or nephrolithiasis. Bladder is unremarkable. Stomach/Bowel: Stomach is within normal limits. Appendix appears normal. Diverticulosis. No evidence of bowel wall thickening, distention, or inflammatory changes. Vascular/lymphatic: Normal caliber abdominal aorta with no significant stenosis. No pathologically enlarged lymph nodes seen in the abdomen or pelvis. Reproductive: No adnexal masses. Other: No abdominal wall hernia or abnormality. No abdominopelvic ascites. Musculoskeletal: No acute or significant osseous findings. VASCULAR MEASUREMENTS PERTINENT TO TAVR: AORTA: Minimal Aortic Diameter -  10.6 mm Severity of Aortic Calcification-moderate RIGHT PELVIS: Right Common Iliac Artery - Minimal Diameter-8.2 mm Tortuosity-mild Calcification-mild Right External Iliac Artery - Minimal Diameter-7.5 mm Tortuosity-mild Calcification-none Right Common Femoral Artery - Minimal Diameter-7.0 mm Tortuosity-mild Calcification-mild LEFT PELVIS: Left Common Iliac Artery - Minimal Diameter-7.5 mm Tortuosity-mild Calcification-mild Left External Iliac Artery - Minimal Diameter-7.5 mm Tortuosity-mild Calcification-none Left Common Femoral Artery - Minimal Diameter-7.1 mm Tortuosity-mild Calcification-mild Review of the MIP images confirms the above findings. IMPRESSION:  1. Vascular findings and measurements pertinent to potential TAVR procedure, as detailed above. 2. Thickening and calcification of the aortic valve, compatible with reported clinical history  of aortic stenosis. 3. Mild-to-moderate aortoiliac atherosclerosis. Left main and 2 vessel coronary artery disease. 4. Small solid pulmonary nodules, largest is a 3 mm nodule of the right middle lobe. No follow-up needed if patient is low-risk (and has no known or suspected primary neoplasm). Non-contrast chest CT can be considered in 12 months if patient is high-risk. This recommendation follows the consensus statement: Guidelines for Management of Incidental Pulmonary Nodules Detected on CT Images: From the Fleischner Society 2017; Radiology 2017; 284:228-243. Electronically Signed   By: Yetta Glassman M.D.   On: 08/02/2022 14:05   Disposition   Pt is being discharged home today in good condition.  Follow-up Plans & Appointments     Follow-up Information     Eileen Stanford, PA-C. Go on 09/05/2022.   Specialties: Cardiology, Radiology Why: @ 3:30pm, please arrive at least 10 minutes early. Contact information: [address]  [place] [address] [phone]                 Discharge Medications   Allergies as of 08/29/2022       Reactions   Reglan [metoclopramide] Other (See Comments)   Tremor   Estrogens Rash   Topical patch caused the rash        Medication List     TAKE these medications    amLODipine 10 MG tablet Commonly known as: NORVASC TAKE 1 TABLET BY MOUTH EVERY DAY   aspirin EC 81 MG tablet Take 1 tablet (81 mg total) by mouth daily. Swallow whole.   gabapentin 300 MG capsule Commonly known as: NEURONTIN TAKE 2 CAPSULES BY MOUTH 3 TIMES A DAY What changed: when to take this   glipiZIDE 10 MG 24 hr tablet Commonly known as: GLUCOTROL XL Take 10 mg by mouth daily.   glipiZIDE 5 MG 24 hr tablet Commonly known as: GLUCOTROL XL Take 1  tablet (5 mg total) by mouth daily with breakfast.   Jardiance 25 MG Tabs tablet Generic drug: empagliflozin Take 25 mg by mouth daily.   losartan 50 MG tablet Commonly known as: COZAAR TAKE 2 TABLETS BY MOUTH EVERY DAY What changed:  how much to take when to take this   melatonin 5 MG Tabs Take 5 mg by mouth at bedtime as needed (sleep).   meloxicam 15 MG tablet Commonly known as: MOBIC TAKE 1 TABLET BY MOUTH EVERY DAY WITH A MEAL   metFORMIN 500 MG tablet Commonly known as: GLUCOPHAGE TAKE 2 TABLETS BY MOUTH 2 TIMES DAILY WITH A MEAL. Notes to patient: Okay to restart 1/18 PM   Muscle Rub 10-15 % Crea Apply 1 Application topically as needed for muscle pain.   omeprazole 40 MG capsule Commonly known as: PRILOSEC Take 1 capsule (40 mg total) by mouth 2 (two) times daily. What changed: when to take this   [place] 2 w/Device Kit Pt uses glucometer to check sugars twice daily. Dx H08.6   OneTouch Delica Plus [id number] Misc USE WHEN CHECKING BLOOD SUGAR AS DIRECTED   OneTouch Ultra test strip Generic drug: glucose blood CHECK BLOOD SUGAR 1-2 TIMES DAILY   oxymetazoline 0.05 % nasal spray Commonly known as: AFRIN Place 1 spray into both nostrils 2 (two) times daily as needed for congestion.   RESTFUL LEGS SL Place 2 tablets under the tongue daily as needed (restless legs).   rosuvastatin 10 MG tablet Commonly known as: CRESTOR Take 1 tablet (10 mg total) by mouth daily. What changed: when to take  this   sitaGLIPtin 100 MG tablet Commonly known as: JANUVIA Take 100 mg by mouth daily.       Outstanding Labs/Studies   none  Duration of Discharge Encounter   Greater than 30 minutes including physician time.  Byrd Hesselbach, PA-C 08/29/2022, 10:25 AM [phone]   Patient seen, examined. Available data reviewed. Agree with findings, assessment, and plan as outlined by Carlean Jews, PA-C.  The patient is independently interviewed and  examined.  Family is at bedside.  She is alert, oriented, no distress.  Lungs are clear to auscultation bilaterally, heart is regular rate and rhythm with a soft ejection murmur at the right upper sternal border, no diastolic murmur.  Abdomen is soft and nontender with no organomegaly or masses.  Bilateral groin sites are clear with no hematoma or ecchymoses.  Lower extremities have no edema.  Skin is warm and dry with no rash.  Telemetry reveals sinus rhythm with occasional PVCs.  No bradycardic events or AV block.  The patient has undergone her echocardiogram which demonstrates normal function of her TAVR bioprosthesis with a mean transvalvular gradient of 11 mmHg and no paravalvular regurgitation.  She is medically stable for hospital discharge today.  Post-TAVR restrictions are discussed with the patient and her family.  Follow-up is arranged as above.  I agree with the medication list outlined above and I have reviewed this.  Tonny Bollman, M.D. 08/29/2022 10:50 AM

## 2022-08-28 NOTE — Discharge Instructions (Signed)
ACTIVITY AND EXERCISE  Daily activity and exercise are an important part of your recovery. People recover at different rates depending on their general health and type of valve procedure.  Most people recovering from TAVR feel better relatively quickly   No lifting, pushing, pulling more than 10 pounds (examples to avoid: groceries, vacuuming, gardening, golfing):             - For one week with a procedure through the groin.             - For six weeks for procedures through the chest wall or neck. NOTE: You will typically see one of our providers 7-14 days after your procedure to discuss WHEN TO RESUME the above activities.      DRIVING  Do not drive until you are seen for follow up and cleared by a provider. Generally, we ask patient to not drive for 1 week after their procedure.  If you have been told by your doctor in the past that you may not drive, you must talk with him/her before you begin driving again.   DRESSING  Groin site: you may leave the clear dressing over the site for up to one week or until it falls off.   HYGIENE  If you had a femoral (leg) procedure, you may take a shower when you return home. After the shower, pat the site dry. Do NOT use powder, oils or lotions in your groin area until the site has completely healed.  If you had a chest procedure, you may shower when you return home unless specifically instructed not to by your discharging practitioner.             - DO NOT scrub incision; pat dry with a towel.             - DO NOT apply any lotions, oils, powders to the incision.             - No tub baths / swimming for at least 2 weeks.  If you notice any fevers, chills, increased pain, swelling, bleeding or pus, please contact your doctor.   ADDITIONAL INFORMATION  If you are going to have an upcoming dental procedure, please contact our office as you will require antibiotics ahead of time to prevent infection on your heart valve.    If you have any questions  or concerns you can call the structural heart phone during normal business hours 8am-4pm. If you have an urgent need after hours or weekends please call [phone] to talk to the on call provider for general cardiology. If you have an emergency that requires immediate attention, please call 911.    After TAVR Checklist  Check  Test Description   Follow up appointment in 1-2 weeks  You will see our structural heart advanced practice provider. Your incision sites will be checked and you will be cleared to drive and resume all normal activities if you are doing well.     1 month echo and follow up  You will have an echo to check on your new heart valve and be seen back in the office by a structural heart advanced practice provider.   Follow up with your primary cardiologist You will need to be seen by your primary cardiologist in the following 3-6 months after your 1 month appointment in the valve clinic.    1 year echo and follow up You will have another echo to check on your heart valve after 1 year   and be seen back in the office by a structural heart advanced practice provider. This your last structural heart visit.   Bacterial endocarditis prophylaxis  You will have to take antibiotics for the rest of your life before all dental procedures (even teeth cleanings) to protect your heart valve. Antibiotics are also required before some surgeries. Please check with your cardiologist before scheduling any surgeries. Also, please make sure to tell us if you have a penicillin allergy as you will require an alternative antibiotic.

## 2022-08-28 NOTE — Progress Notes (Signed)
  [facility]   MULTIDISCIPLINARY HEART VALVE TEAM  Patient doing well s/p TAVR. He/She is hemodynamically stable. Groin sites stable. ECG with new LBBB but no high grade block. Arterial line discontinued and transferred to 4E. Plan for early ambulation after bedrest completed and hopeful discharge over the next 24-48 hours.   Cline Crock PA-C  [facility]  Pager [phone]

## 2022-08-28 NOTE — Interval H&P Note (Signed)
History and Physical Interval Note:  08/28/2022 7:11 AM  Karen Gonzales  has presented today for surgery, with the diagnosis of Severe Aortic Stenosis.  The various methods of treatment have been discussed with the patient and family. After consideration of risks, benefits and other options for treatment, the patient has consented to  Procedure(s): Transcatheter Aortic Valve Replacement, Transfemoral (N/A) INTRAOPERATIVE TRANSTHORACIC ECHOCARDIOGRAM (N/A) as a surgical intervention.  The patient's history has been reviewed, patient examined, no change in status, stable for surgery.  I have reviewed the patient's chart and labs.  Questions were answered to the patient's satisfaction.     Waverly Ferrari Petra Sargeant

## 2022-08-28 NOTE — Transfer of Care (Signed)
Immediate Anesthesia Transfer of Care Note  Patient: Karen Gonzales  Procedure(s) Performed: Transcatheter Aortic Valve Replacement, Transfemoral Approach using [place] 23 MM SAPIEN 3 Ultra (Chest) INTRAOPERATIVE TRANSTHORACIC ECHOCARDIOGRAM  Patient Location: Cath Lab  Anesthesia Type:MAC  Level of Consciousness: drowsy and patient cooperative  Airway & Oxygen Therapy: Patient Spontanous Breathing and Patient connected to nasal cannula oxygen  Post-op Assessment: Report given to RN, Post -op Vital signs reviewed and stable, and Patient moving all extremities  Post vital signs: Reviewed and stable  Last Vitals:  Vitals Value Taken Time  BP    Temp    Pulse    Resp 34 08/28/22 0916  SpO2    Vitals shown include unvalidated device data.  Last Pain:  Vitals:   08/28/22 0606  PainSc: 0-No pain         Complications: No notable events documented.

## 2022-08-28 NOTE — Anesthesia Postprocedure Evaluation (Signed)
Anesthesia Post Note  Patient: Karen Gonzales  Procedure(s) Performed: Transcatheter Aortic Valve Replacement, Transfemoral Approach using Edwards 23 MM SAPIEN 3 Ultra (Chest) INTRAOPERATIVE TRANSTHORACIC ECHOCARDIOGRAM     Patient location during evaluation: PACU Anesthesia Type: MAC Level of consciousness: awake and alert Pain management: pain level controlled Vital Signs Assessment: post-procedure vital signs reviewed and stable Respiratory status: spontaneous breathing, nonlabored ventilation, respiratory function stable and patient connected to nasal cannula oxygen Cardiovascular status: stable and blood pressure returned to baseline Postop Assessment: no apparent nausea or vomiting Anesthetic complications: no  No notable events documented.  Last Vitals:  Vitals:   08/28/22 1400 08/28/22 1500  BP: (!) 154/60 (!) 156/67  Pulse: 77 78  Resp:    Temp:    SpO2: 95% 97%    Last Pain:  Vitals:   08/28/22 1113  TempSrc: Oral  PainSc: 0-No pain                 Tiajuana Amass

## 2022-08-28 NOTE — Anesthesia Procedure Notes (Signed)
Arterial Line Insertion Start/End1/16/2024 6:55 AM, 08/28/2022 7:05 AM Performed by: Shireen Quan, CRNA, CRNA  Patient location: Pre-op. Preanesthetic checklist: patient identified, IV checked, site marked, risks and benefits discussed, surgical consent, monitors and equipment checked, pre-op evaluation, timeout performed and anesthesia consent Lidocaine 1% used for infiltration Left, radial was placed Catheter size: 20 G Hand hygiene performed , maximum sterile barriers used  and Seldinger technique used  Attempts: 1 Procedure performed without using ultrasound guided technique. Following insertion, dressing applied and Biopatch. Post procedure assessment: normal  Patient tolerated the procedure well with no immediate complications.

## 2022-08-28 NOTE — Progress Notes (Signed)
Mobility Specialist Progress Note:   08/28/22 1442  Mobility  Activity Ambulated with assistance in hallway  Level of Assistance Standby assist, set-up cues, supervision of patient - no hands on  Assistive Device None  Distance Ambulated (ft) 250 ft  Activity Response Tolerated well  $Mobility charge 1 Mobility   Pre- Mobility: 78 HR;  169/63 BP;  96% SpO2 Post Mobility:  86 HR;  175/74 BP; 98%  SpO2  Pt in bed willing to participate in mobility. No complaints of pain. Left in bed with call bell in reach and all needs met.   [place] Keslie Gritz Mobility Specialist Please contact via [employer] or  Rehab Office at [phone]

## 2022-08-28 NOTE — H&P (Signed)
[address]       [place] [address]             [phone]                                                   Karen Gonzales [facility] Medical Record #[id number] Date of Birth: [birth year]   Referring: Jodelle Red,* Primary Care: Sheliah Hatch, MD Primary Cardiologist: Jodelle Red, MD   Chief Complaint:        Chief Complaint  Patient presents with   Aortic Stenosis      Surgical consult for TAVR      History of Present Illness:    Karen Gonzales 77 y.o. female  referred for TAVR evaluation.    Per cardiology H&P" The patient has a history of essential hypertension, mixed hyperlipidemia, type 2 diabetes, nonalcoholic steatohepatitis, anticardiolipin antibody syndrome, and progressive aortic stenosis.  The patient was diagnosed with moderate aortic stenosis earlier this year.  She was seen by Dr. Cristal Deer and a 59-month follow-up echocardiogram was recommended.  Recent echo study shows progressive aortic stenosis, now in the severe range.  The patient was referred to discuss treatment options.   She has a longstanding heart murmur that became more prominent in the past few years. She was referred for an echocardiogram in May 2023 and this demonstrated moderate aortic stenosis. As above, a 6 month follow-up echo was performed 06/29/2022 and shows normal LVEF of 65%, calcified and restricted opening of the aortic valve, with peak and mean gradients of 58 and 33 mmHg, respectively. The DI is 0.24, SVI 33, and calculated AVA is 0.7 square cm, all consistent with severe stage D3 paradoxical low flow low gradient AS.    From a symptomatic perspective, she complains of progressive exertional dyspnea over the past 6 months, now limited by dyspnea with walking and light housework. She also complains of occasional chest discomfort, lightheadedness, and she has experienced worsening fatigue. No syncope. She has had regular dental care and is  having a tooth pulled in the near future. She has been considering a low partial. "   My interview confirms symptomatic progressive valvular heart disease.  Dental will be fully taken care of soon.            Past Medical History:  Diagnosis Date   Anti-cardiolipin antibody syndrome (HCC) 06/25/2011   Arthritis     Blood transfusion      had when knee was replaced   Bronchitis      history of   Chills     Complication of anesthesia     Cough     COVID-19     Diabetes mellitus     DVT (deep venous thrombosis) (HCC) 06/25/2011   Fatty liver disease, nonalcoholic      determinde by Ultrasound 11/11   GERD (gastroesophageal reflux disease)     H/O vitamin D deficiency     Heart murmur     Heart murmur      Benign   Hiatal hernia     Hyperlipidemia     Hypertension     Hyponatremia      with diuretic use   Osteopenia     PONV (postoperative nausea and vomiting)     Vertigo  Past Surgical History:  Procedure Laterality Date   ABDOMINAL HYSTERECTOMY       ACHILLES TENDON SURGERY        bilateral    CARPAL TUNNEL RELEASE        bilateral    CHOLECYSTECTOMY   1969    open   CRANIOTOMY   07/09/2011    Procedure: CRANIOTOMY HEMATOMA EVACUATION SUBDURAL;  Surgeon: Kathaleen Maser Pool;  Location: [facility];  Service: Neurosurgery;  Laterality: Right;  Excision of Skull Lesion-Right Right Parietal Crani w/ Resection of Skull Tumor (1 1/2 hours - to follow)   DILATION AND CURETTAGE OF UTERUS       JOINT REPLACEMENT   2004    left knee replacement    KNEE SURGERY        total of 6 surgeries on both knees    REPAIR PERONEAL TENDONS ANKLE       RIGHT/LEFT HEART CATH AND CORONARY ANGIOGRAPHY N/A 07/19/2022    Procedure: RIGHT/LEFT HEART CATH AND CORONARY ANGIOGRAPHY;  Surgeon: Kathleene Hazel, MD;  Location: [facility];  Service: Cardiovascular;  Laterality: N/A;   TUBAL LIGATION       TUMOR REMOVAL        on pelvic bone            Family History   Problem Relation Age of Onset   Kidney disease Mother     Osteoporosis Mother     Heart disease Father     Cancer Father          lymphoma   Osteoporosis Father     Healthy Brother     Cancer Maternal Grandmother          lymphoma   Breast cancer Maternal Grandmother     Cancer Paternal Grandmother          breast   Breast cancer Paternal Aunt     Hodgkin's lymphoma Grandchild          Social History       Tobacco Use  Smoking Status Never  Smokeless Tobacco Never    Social History       Substance and Sexual Activity  Alcohol Use No             Allergies  Allergen Reactions   Reglan [Metoclopramide] Other (See Comments)      Tremor     Estrogens Rash      Topical patch caused the rash            Current Outpatient Medications  Medication Sig Dispense Refill   amLODipine (NORVASC) 10 MG tablet TAKE 1 TABLET BY MOUTH EVERY DAY 90 tablet 1   aspirin EC 81 MG tablet Take 1 tablet (81 mg total) by mouth daily. Swallow whole. 90 tablet 3   Blood Glucose Monitoring Suppl (ONE TOUCH ULTRA 2) w/Device KIT Pt uses glucometer to check sugars twice daily. Dx E11.9 1 each 0   empagliflozin (JARDIANCE) 25 MG TABS tablet Take by mouth daily.       gabapentin (NEURONTIN) 300 MG capsule TAKE 2 CAPSULES BY MOUTH 3 TIMES A DAY (Patient taking differently: Take 600 mg by mouth 2 (two) times daily.) 180 capsule 3   glipiZIDE (GLUCOTROL XL) 5 MG 24 hr tablet Take 1 tablet (5 mg total) by mouth daily with breakfast. 90 tablet 0   Lancets (ONETOUCH DELICA PLUS LANCET33G) MISC USE WHEN CHECKING BLOOD SUGAR AS DIRECTED 100 each 4   losartan (COZAAR) 50  MG tablet TAKE 2 TABLETS BY MOUTH EVERY DAY (Patient taking differently: Take 50 mg by mouth 2 (two) times daily.) 180 tablet 1   melatonin 5 MG TABS Take 5 mg by mouth at bedtime as needed (sleep).       meloxicam (MOBIC) 15 MG tablet TAKE 1 TABLET BY MOUTH EVERY DAY WITH A MEAL 30 tablet 0   metFORMIN (GLUCOPHAGE) 500 MG tablet TAKE 2  TABLETS BY MOUTH 2 TIMES DAILY WITH A MEAL. 360 tablet 1   omeprazole (PRILOSEC) 40 MG capsule Take 1 capsule (40 mg total) by mouth 2 (two) times daily. (Patient taking differently: Take 40 mg by mouth daily.) 60 capsule 1   ONETOUCH ULTRA test strip CHECK BLOOD SUGAR 1-2 TIMES DAILY 100 strip 12   rosuvastatin (CRESTOR) 10 MG tablet Take 1 tablet (10 mg total) by mouth daily. 90 tablet 3   sitaGLIPtin (JANUVIA) 100 MG tablet Take 100 mg by mouth daily.       metoprolol tartrate (LOPRESSOR) 25 MG tablet Take 1 tablet (25 mg total) by mouth as directed. Take 2 hours before your CT scans on 12/21 to slow heart rate 1 tablet 0    No current facility-administered medications for this visit.      ROS 14 point ROS reviewed and negative except as per HPI     PHYSICAL EXAMINATION: BP (!) 160/74   Pulse 84   Resp 20   Ht 5\' 5"  (1.651 m)   Wt 183 lb (83 kg)   SpO2 95%   BMI 30.45 kg/m    Gen: NAD Neuro: Alert and oriented CV: RRR systolic murmur Resp: Nonlaboured Abd: Soft, ntnd Extr: WWP   Diagnostic Studies & Laboratory data:     Recent Radiology Findings:    Imaging Results  CT CORONARY MORPH W/CTA COR W/SCORE W/CA W/CM &/OR WO/CM   Addendum Date: 08/05/2022   ADDENDUM REPORT: 08/05/2022 12:59 CLINICAL DATA:  Aortic valve replacement (TAVR), pre-op eval EXAM: Cardiac TAVR CT TECHNIQUE: The patient was scanned on a Siemens Force 192 slice scanner. A 120 kV retrospective scan was triggered in the descending thoracic aorta at 111 HU's. Gantry rotation speed was 270 msecs and collimation was .9 mm. The 3D data set was reconstructed in 5% intervals of the R-R cycle. Systolic and diastolic phases were analyzed on a dedicated work station using MPR, MIP and VRT modes. The patient received 35mL OMNIPAQUE IOHEXOL 350 MG/ML SOLN of contrast. FINDINGS: Aortic Valve: Tricuspid aortic valve with severely reduced cusp excursion. Severely thickened and severely calcified aortic valve cusps. AV  calcium score: 1732 Virtual Basal Annulus Measurements: Maximum/Minimum Diameter: 25.1 x 20.4 mm Perimeter: 70.2 mm Area:  375 mm2 No significant LVOT calcifications. Membranous septal length: 2.5 mm Based on these measurements, the annulus would be suitable for a 23 mm Sapien valve. Alternatively, Heart Team can consider 26 mm Evolut valve. Borderline sinus of Valsalva diameter for 26 mm valve, recommend valve team discussion for valve selection. Sinus of Valsalva Measurements: Non-coronary:  29 mm Right - coronary:  26 mm Left - coronary:  29 mm Sinus of Valsalva Height: Left: 23.7 mm Right: 21.1 mm Aorta: Conventional 3 vessel branch pattern of aortic arch. Sinotubular Junction:  27 mm Ascending Thoracic Aorta:  31 mm Aortic Arch:  26 mm Descending Thoracic Aorta:  23 mm Coronary Artery Height above Annulus: Left main: 18.7 mm Right coronary: 16.6 mm Coronary Arteries: Normal coronary origin. Left dominance. The study was performed without use of NTG  and insufficient for plaque evaluation. Coronary artery calcium seen left main and LAD, mild. Optimum Fluoroscopic Angle for Delivery: RAO 0 CRA 0 OTHER: Atria: Left atrial appendage: No thrombus. Mitral valve: Grossly normal, mild mitral annular calcifications. Pulmonary artery: Normal caliber main PA, mild dilation of branch pulmonary arteries. Pulmonary veins: Normal anatomy. IMPRESSION: 1. Tricuspid aortic valve with severely reduced cusp excursion. Severely thickened and severely calcified aortic valve cusps. 2.  Aortic valve calcium score: 1732 3. Annulus area: 375 mm2, suitable for 23 mm Sapien 3 valve. No significant LVOT calcifications. 4.  Sufficient coronary artery heights from annulus. 5.  Optimum fluoroscopic angle for delivery: RAO 0 CRA 0 Electronically Signed   By: Weston Brass M.D.   On: 08/05/2022 12:59    Result Date: 08/05/2022 EXAM: OVER-READ INTERPRETATION  CT CHEST The following report is a limited chest CT over-read performed by  radiologist Dr. Allegra Lai of [facility] on 08/02/2022. This over-read does not include interpretation of cardiac or coronary anatomy or pathology. The cardiac TAVR interpretation by the cardiologist is attached. COMPARISON:  None Available. FINDINGS: Extracardiac findings will be described separately under dictation for contemporaneously obtained CTA chest, abdomen and pelvis. IMPRESSION: Please see separate dictation for contemporaneously obtained CTA chest, abdomen and pelvis dated 08/02/2022 for full description of relevant extracardiac findings. Electronically Signed: By: Allegra Lai M.D. On: 08/02/2022 14:05    CT ANGIO ABDOMEN PELVIS  W &/OR WO CONTRAST   Result Date: 08/02/2022 CLINICAL DATA:  Preop evaluation for TAVR EXAM: CT ANGIOGRAPHY CHEST, ABDOMEN AND PELVIS TECHNIQUE: Multidetector CT imaging through the chest, abdomen and pelvis was performed using the standard protocol during bolus administration of intravenous contrast. Multiplanar reconstructed images and MIPs were obtained and reviewed to evaluate the vascular anatomy. RADIATION DOSE REDUCTION: This exam was performed according to the departmental dose-optimization program which includes automated exposure control, adjustment of the mA and/or kV according to patient size and/or use of iterative reconstruction technique. CONTRAST:  76mL OMNIPAQUE IOHEXOL 350 MG/ML SOLN COMPARISON:  None Available. FINDINGS: CTA CHEST FINDINGS Cardiovascular: Normal heart size. No pericardial effusion aortic valve thickening and calcifications. Normal caliber thoracic aorta with moderate atherosclerotic disease. Standard three-vessel aortic arch with no significant stenosis. Coronary artery calcifications of the left main, lad and RCA. No suspicious filling defects of the central pulmonary arteries. Mediastinum/Nodes: Small hiatal hernia. No pathologically enlarged lymph nodes seen in the chest. Lungs/Pleura: Central airways are  patent. No consolidation, pleural effusion or pneumothorax. Small solid pulmonary nodules, largest is a 3 mm nodule of the right middle lobe located on series 5, image 69. Musculoskeletal: No chest wall abnormality. No acute or significant osseous findings. CTA ABDOMEN AND PELVIS FINDINGS Hepatobiliary: No focal liver abnormality is seen. Status post cholecystectomy. Mild prominence of the common bile duct and central intrahepatic bile ducts, not unexpected status post cholecystectomy. Pancreas: Unremarkable. No pancreatic ductal dilatation or surrounding inflammatory changes. Spleen: Normal in size without focal abnormality. Adrenals/Urinary Tract: Bilateral adrenal glands are unremarkable. No hydronephrosis or nephrolithiasis. Bladder is unremarkable. Stomach/Bowel: Stomach is within normal limits. Appendix appears normal. Diverticulosis. No evidence of bowel wall thickening, distention, or inflammatory changes. Vascular/lymphatic: Normal caliber abdominal aorta with no significant stenosis. No pathologically enlarged lymph nodes seen in the abdomen or pelvis. Reproductive: No adnexal masses. Other: No abdominal wall hernia or abnormality. No abdominopelvic ascites. Musculoskeletal: No acute or significant osseous findings. VASCULAR MEASUREMENTS PERTINENT TO TAVR: AORTA: Minimal Aortic Diameter -  10.6 mm Severity of Aortic Calcification-moderate RIGHT  PELVIS: Right Common Iliac Artery - Minimal Diameter-8.2 mm Tortuosity-mild Calcification-mild Right External Iliac Artery - Minimal Diameter-7.5 mm Tortuosity-mild Calcification-none Right Common Femoral Artery - Minimal Diameter-7.0 mm Tortuosity-mild Calcification-mild LEFT PELVIS: Left Common Iliac Artery - Minimal Diameter-7.5 mm Tortuosity-mild Calcification-mild Left External Iliac Artery - Minimal Diameter-7.5 mm Tortuosity-mild Calcification-none Left Common Femoral Artery - Minimal Diameter-7.1 mm Tortuosity-mild Calcification-mild Review of the MIP images  confirms the above findings. IMPRESSION: 1. Vascular findings and measurements pertinent to potential TAVR procedure, as detailed above. 2. Thickening and calcification of the aortic valve, compatible with reported clinical history of aortic stenosis. 3. Mild-to-moderate aortoiliac atherosclerosis. Left main and 2 vessel coronary artery disease. 4. Small solid pulmonary nodules, largest is a 3 mm nodule of the right middle lobe. No follow-up needed if patient is low-risk (and has no known or suspected primary neoplasm). Non-contrast chest CT can be considered in 12 months if patient is high-risk. This recommendation follows the consensus statement: Guidelines for Management of Incidental Pulmonary Nodules Detected on CT Images: From the Fleischner Society 2017; Radiology 2017; 284:228-243. Electronically Signed   By: Allegra Lai M.D.   On: 08/02/2022 14:05    CT ANGIO CHEST AORTA W/CM & OR WO/CM   Result Date: 08/02/2022 CLINICAL DATA:  Preop evaluation for TAVR EXAM: CT ANGIOGRAPHY CHEST, ABDOMEN AND PELVIS TECHNIQUE: Multidetector CT imaging through the chest, abdomen and pelvis was performed using the standard protocol during bolus administration of intravenous contrast. Multiplanar reconstructed images and MIPs were obtained and reviewed to evaluate the vascular anatomy. RADIATION DOSE REDUCTION: This exam was performed according to the departmental dose-optimization program which includes automated exposure control, adjustment of the mA and/or kV according to patient size and/or use of iterative reconstruction technique. CONTRAST:  10mL OMNIPAQUE IOHEXOL 350 MG/ML SOLN COMPARISON:  None Available. FINDINGS: CTA CHEST FINDINGS Cardiovascular: Normal heart size. No pericardial effusion aortic valve thickening and calcifications. Normal caliber thoracic aorta with moderate atherosclerotic disease. Standard three-vessel aortic arch with no significant stenosis. Coronary artery calcifications of the left  main, lad and RCA. No suspicious filling defects of the central pulmonary arteries. Mediastinum/Nodes: Small hiatal hernia. No pathologically enlarged lymph nodes seen in the chest. Lungs/Pleura: Central airways are patent. No consolidation, pleural effusion or pneumothorax. Small solid pulmonary nodules, largest is a 3 mm nodule of the right middle lobe located on series 5, image 69. Musculoskeletal: No chest wall abnormality. No acute or significant osseous findings. CTA ABDOMEN AND PELVIS FINDINGS Hepatobiliary: No focal liver abnormality is seen. Status post cholecystectomy. Mild prominence of the common bile duct and central intrahepatic bile ducts, not unexpected status post cholecystectomy. Pancreas: Unremarkable. No pancreatic ductal dilatation or surrounding inflammatory changes. Spleen: Normal in size without focal abnormality. Adrenals/Urinary Tract: Bilateral adrenal glands are unremarkable. No hydronephrosis or nephrolithiasis. Bladder is unremarkable. Stomach/Bowel: Stomach is within normal limits. Appendix appears normal. Diverticulosis. No evidence of bowel wall thickening, distention, or inflammatory changes. Vascular/lymphatic: Normal caliber abdominal aorta with no significant stenosis. No pathologically enlarged lymph nodes seen in the abdomen or pelvis. Reproductive: No adnexal masses. Other: No abdominal wall hernia or abnormality. No abdominopelvic ascites. Musculoskeletal: No acute or significant osseous findings. VASCULAR MEASUREMENTS PERTINENT TO TAVR: AORTA: Minimal Aortic Diameter -  10.6 mm Severity of Aortic Calcification-moderate RIGHT PELVIS: Right Common Iliac Artery - Minimal Diameter-8.2 mm Tortuosity-mild Calcification-mild Right External Iliac Artery - Minimal Diameter-7.5 mm Tortuosity-mild Calcification-none Right Common Femoral Artery - Minimal Diameter-7.0 mm Tortuosity-mild Calcification-mild LEFT PELVIS: Left Common Iliac Artery - Minimal Diameter-7.5  mm Tortuosity-mild  Calcification-mild Left External Iliac Artery - Minimal Diameter-7.5 mm Tortuosity-mild Calcification-none Left Common Femoral Artery - Minimal Diameter-7.1 mm Tortuosity-mild Calcification-mild Review of the MIP images confirms the above findings. IMPRESSION: 1. Vascular findings and measurements pertinent to potential TAVR procedure, as detailed above. 2. Thickening and calcification of the aortic valve, compatible with reported clinical history of aortic stenosis. 3. Mild-to-moderate aortoiliac atherosclerosis. Left main and 2 vessel coronary artery disease. 4. Small solid pulmonary nodules, largest is a 3 mm nodule of the right middle lobe. No follow-up needed if patient is low-risk (and has no known or suspected primary neoplasm). Non-contrast chest CT can be considered in 12 months if patient is high-risk. This recommendation follows the consensus statement: Guidelines for Management of Incidental Pulmonary Nodules Detected on CT Images: From the Fleischner Society 2017; Radiology 2017; 284:228-243. Electronically Signed   By: Allegra Lai M.D.   On: 08/02/2022 14:05           I have independently reviewed the above radiology studies  and reviewed the findings with the patient.    Recent Lab Findings: Recent Labs       Lab Results  Component Value Date    WBC 8.4 07/16/2022    HGB 11.6 (L) 07/19/2022    HGB 9.2 (L) 07/19/2022    HCT 34.0 (L) 07/19/2022    HCT 27.0 (L) 07/19/2022    PLT 204 07/16/2022    GLUCOSE 119 (H) 07/31/2022    CHOL 111 04/30/2022    TRIG 146.0 04/30/2022    HDL 48.00 04/30/2022    LDLDIRECT 104.0 04/08/2017    LDLCALC 34 04/30/2022    ALT 30 04/30/2022    AST 25 04/30/2022    NA 133 (L) 07/31/2022    K 4.6 07/31/2022    CL 99 07/31/2022    CREATININE 0.85 07/31/2022    BUN 21 07/31/2022    CO2 25 07/31/2022    TSH 1.99 04/30/2022    HGBA1C 7.6 (H) 07/31/2022          Assessment / Plan:   19F with symptomatic severe AS.  Meets criteria for AV  replacement.   Valve: Sapien 23 recommended.  A 26 Evolut needs 33mm SOV and this patient has 35mm SOV. That's doable but close.  Bailout: yes Access: Left transfemoral, the right is OK diameter but higher.  [facility]H[place]ell Locus 08/21/2022 6:20 PM

## 2022-08-29 ENCOUNTER — Encounter (HOSPITAL_COMMUNITY): Payer: Self-pay | Admitting: Cardiovascular Disease

## 2022-08-29 ENCOUNTER — Inpatient Hospital Stay (HOSPITAL_COMMUNITY): Payer: PPO

## 2022-08-29 DIAGNOSIS — Z006 Encounter for examination for normal comparison and control in clinical research program: Secondary | ICD-10-CM | POA: Diagnosis not present

## 2022-08-29 DIAGNOSIS — D6861 Antiphospholipid syndrome: Secondary | ICD-10-CM | POA: Diagnosis not present

## 2022-08-29 DIAGNOSIS — Z952 Presence of prosthetic heart valve: Secondary | ICD-10-CM

## 2022-08-29 DIAGNOSIS — Z8616 Personal history of COVID-19: Secondary | ICD-10-CM | POA: Diagnosis not present

## 2022-08-29 DIAGNOSIS — I350 Nonrheumatic aortic (valve) stenosis: Secondary | ICD-10-CM | POA: Diagnosis not present

## 2022-08-29 LAB — BASIC METABOLIC PANEL
Anion gap: 9 (ref 5–15)
BUN: 17 mg/dL (ref 8–23)
CO2: 22 mmol/L (ref 22–32)
Calcium: 8.8 mg/dL — ABNORMAL LOW (ref 8.9–10.3)
Chloride: 101 mmol/L (ref 98–111)
Creatinine, Ser: 0.84 mg/dL (ref 0.44–1.00)
GFR, Estimated: >60 mL/min (ref >60)
Glucose, Bld: 160 mg/dL — ABNORMAL HIGH (ref 70–99)
Potassium: 4.4 mmol/L (ref 3.5–5.1)
Sodium: 132 mmol/L — ABNORMAL LOW (ref 135–145)

## 2022-08-29 LAB — CBC
HCT: 30.8 % — ABNORMAL LOW (ref 36.0–46.0)
Hemoglobin: 9.7 g/dL — ABNORMAL LOW (ref 12.0–15.0)
MCH: 25.8 pg — ABNORMAL LOW (ref 26.0–34.0)
MCHC: 31.5 g/dL (ref 30.0–36.0)
MCV: 81.9 fL (ref 80.0–100.0)
Platelets: 160 10*3/uL (ref 150–400)
RBC: 3.76 MIL/uL — ABNORMAL LOW (ref 3.87–5.11)
RDW: 14.2 % (ref 11.5–15.5)
WBC: 9.6 10*3/uL (ref 4.0–10.5)
nRBC: 0 % (ref 0.0–0.2)

## 2022-08-29 LAB — GLUCOSE, CAPILLARY
Glucose-Capillary: 157 mg/dL — ABNORMAL HIGH (ref 70–99)
Glucose-Capillary: 133 mg/dL — ABNORMAL HIGH (ref 70–99)

## 2022-08-29 LAB — ECHOCARDIOGRAM COMPLETE
AR max vel: 2.18 cm2
AV Area VTI: 2.53 cm2
AV Area mean vel: 2.47 cm2
AV Mean grad: 12 mmHg
AV Peak grad: 22.8 mmHg
Ao pk vel: 2.39 m/s
Area-P 1/2: 3.68 cm2
Height: 65 in
S' Lateral: 2.6 cm
Weight: 2931.2 oz

## 2022-08-29 LAB — MAGNESIUM: Magnesium: 1.7 mg/dL (ref 1.7–2.4)

## 2022-08-29 NOTE — Progress Notes (Signed)
  Echocardiogram 2D Echocardiogram has been performed.  Leta Jungling M 08/29/2022, 9:23 AM

## 2022-08-29 NOTE — Plan of Care (Signed)

## 2022-08-29 NOTE — Progress Notes (Signed)
Discharge instructions reviewed with pt, and family. Questions answered.  Copy of instructions given to pt. No new scripts.  Pt to be d/c'd via wheelchair with belongings, with family.         Escorted by hospital volunteer.

## 2022-08-29 NOTE — Progress Notes (Signed)
CARDIAC REHAB PHASE I   PRE:  Rate/Rhythm: 83 SR  BP:  Sitting: 151/73      SaO2: 96 RA  MODE:  Ambulation: 470 ft   POST:  Rate/Rhythm: 88 SR   BP:  Sitting: 166/54      SaO2: 95 RA   Pt ambulated independently in hall, maintaining slow steady pace. Tolerated well with no CP, dizziness or SOB. Returned to bed with call bell and bedside table in reach. Post TAVR education including site care, restrictions, risk factors, heart healthy diabetic diet, exercise guidelines and CRP2 reviewed. All questions and concerns addressed. Pt is not interested in CRP2 at this time. Pt states that she is busy helping with grandchildren's care. Plan for possible discharge today.   [id number]   Woodroe Chen, RN BSN 08/29/2022 9:36 AM

## 2022-08-29 NOTE — Op Note (Addendum)
OPERATIVE NOTE: Patient Name: Karen Gonzales Date of Birth: Nov 05, 1945 Date of Operation: 08/28/2022    PRE-OPERATIVE DIAGNOSIS: Severe, Symptomatic Aortic Stenosis   POST-OPERATIVE DIAGNOSIS: Same   OPERATION: Transfemoral Sapien S3 33mm Aortic Valve Placement ( Edwards Sapien 3 Ultra Resilia THV (size 23 mm, serial # [id number] ) )   SURGEON: Waverly Ferrari Ruby Logiudice MD   Co-Surgeon: Tonny Bollman, MD   EBL 10cc   FINDINGS: Good implant depth - around 95/5 on left cusp No significant AI   SPECIMENS: None   COMPLICATIONS: None   TUBES:  None   PROCEDURE IN DETAIL: The patient was brought in the operating room and laid in supine position.  The patient was prepped and draped in standard fashion. Arterial and venous lines were placed by anesthesia along under sedation. After a timeout was performed, right and left transfemoral access was gained with ultrasound guidance. The venous access was used to advance a temporary pacer into the right heart from the left groin.    On the left femoral artery, a 6Fr sheath was placed and wire and pigtail through this into the noncoronary cusp.    We then used micropuncture techniques and ultrasound to approach the right groin and gained access and placed 2 Perclose devices. Via an 8 Fr sheath an Amplatz wire was placed and then exchanged for a 14 Fr E-sheath. The patient was heparinized systemically and ACT verified > 250 seconds.   Then an AL caltheter was used with flexible straight tip to get into the LV. No BAV performed. Then exchanged over a pigtail for a pre-curled wire.   An Edwards Sapien 3 THV (size 23 mm) was prepared and crimped per manufacturer's guidelines, and the proper orientation of the valve is confirmed on the [employer] delivery system. The valve was advanced through the introducer sheath using normal technique until in an appropriate position in the abdominal aorta beyond the sheath tip. The balloon was then retracted  and using the fine-tuning wheel was centered on the valve. The valve was then advanced across the aortic arch using appropriate flexion of the catheter. The valve was carefully positioned across the aortic valve annulus. The Commander catheter was retracted using normal technique. Once final position of the valve has been confirmed by angiographic assessment, the valve is deployed while temporarily holding ventilation and during rapid ventricular pacing to maintain systolic blood pressure < 50 mmHg and pulse pressure < 10 mmHg. The balloon inflation is held for >3 seconds after reaching full deployment volume. Once the balloon has fully deflated the balloon is retracted into the ascending aorta and valve function is assessed using TTE. There is felt to be no paravalvular leak and no central aortic insufficiency.  The patient's hemodynamic recovery following valve deployment is good.  The deployment balloon and guidewire are both removed. Echo demostrated acceptable post-procedural gradients, stable mitral valve function, and no AI.  Implant depth was excellent. Hemodynamics were good. We then removed the sheath and deployed perclose x 2. Protamine was administered.    Sheaths were removed and closure devices had been completed. I defer to Dr. Excell Seltzer regarding the Mynx closure of artery.    The patient had a stable status and was transferred to the postoperative care unit in stable condition. All surgical counts were correct.

## 2022-08-29 NOTE — TOC Transition Note (Signed)
Transition of Care [facility]) - CM/SW Discharge Note   Patient Details  Name: Karen Gonzales MRN: [id number] Date of Birth: 1946-07-10  [facility] of [facility]) CM/SW Contact:  Gordy Clement, RN Phone Number: 08/29/2022, 12:00 PM   Clinical Narrative:     Patient to DC to home today with Family. Patient confirms established PCP and will follow up as recommended.   No TOC needs identified     Final next level of care: Home/Self Care Barriers to Discharge: No Barriers Identified   Patient Goals and CMS Choice   Choice offered to / list presented to : NA  Discharge Placement                         Discharge Plan and Services Additional resources added to the After Visit Summary for                  DME Arranged: N/A DME Agency: NA       HH Arranged: NA HH Agency: NA        Social Determinants of Health (SDOH) Interventions SDOH Screenings   Food Insecurity: No Food Insecurity (06/06/2022)  Housing: Low Risk  (06/06/2022)  Transportation Needs: No Transportation Needs (06/06/2022)  Utilities: Not At Risk (06/06/2022)  Alcohol Screen: Low Risk  (06/06/2022)  Depression (PHQ2-9): Medium Risk (07/31/2022)  Financial Resource Strain: Low Risk  (06/06/2022)  Physical Activity: Insufficiently Active (06/06/2022)  Social Connections: Moderately Integrated (06/06/2022)  Stress: No Stress Concern Present (06/06/2022)  Tobacco Use: Low Risk  (08/29/2022)     Readmission Risk Interventions     No data to display

## 2022-08-30 ENCOUNTER — Telehealth: Payer: Self-pay

## 2022-08-30 LAB — BPAM RBC
Blood Product Expiration Date: 202401242359
Blood Product Expiration Date: 202401242359
Unit Type and Rh: 600
Unit Type and Rh: 600

## 2022-08-30 LAB — TYPE AND SCREEN
ABO/RH(D): A NEG
Antibody Screen: POSITIVE
Donor AG Type: NEGATIVE
Donor AG Type: NEGATIVE
Unit division: 0
Unit division: 0

## 2022-08-30 NOTE — Telephone Encounter (Signed)
Patient contacted regarding discharge from [facility] on 08/29/2022.  Patient understands to follow up with provider Structural Heart APP on 09/05/2022 at 3:30 PM at [facility] office. Patient understands discharge instructions? yes Patient understands medications and regiment? yes Patient understands to bring all medications to this visit? Yes  The pt is doing well at this time.  I advised her to contact the office if she has any questions or concerns.

## 2022-08-31 ENCOUNTER — Other Ambulatory Visit: Payer: Self-pay | Admitting: Family Medicine

## 2022-08-31 NOTE — Telephone Encounter (Signed)
/  Gabapentin 300 mg LOV: 07/31/22 Last /Refill:04/09/22 Upcoming appt: no apt

## 2022-08-31 NOTE — Telephone Encounter (Signed)
Informed pt that Rx has been sent in

## 2022-09-05 ENCOUNTER — Ambulatory Visit: Payer: PPO | Attending: Cardiology | Admitting: Physician Assistant

## 2022-09-05 VITALS — BP 130/55 | HR 81 | Ht 65.0 in | Wt 182.4 lb

## 2022-09-05 DIAGNOSIS — Z952 Presence of prosthetic heart valve: Secondary | ICD-10-CM

## 2022-09-05 DIAGNOSIS — R42 Dizziness and giddiness: Secondary | ICD-10-CM | POA: Diagnosis not present

## 2022-09-05 DIAGNOSIS — R911 Solitary pulmonary nodule: Secondary | ICD-10-CM

## 2022-09-05 DIAGNOSIS — I10 Essential (primary) hypertension: Secondary | ICD-10-CM

## 2022-09-05 MED ORDER — AMOXICILLIN 500 MG PO TABS: 2000.0000 mg | ORAL_TABLET | ORAL | 12 refills | Status: DC

## 2022-09-05 MED FILL — Magnesium Sulfate Inj 50%: INTRAMUSCULAR | Qty: 10 | Status: AC

## 2022-09-05 MED FILL — Heparin Sodium (Porcine) Inj 1000 Unit/ML: Qty: 1000 | Status: AC

## 2022-09-05 MED FILL — Potassium Chloride Inj 2 mEq/ML: INTRAVENOUS | Qty: 40 | Status: AC

## 2022-09-05 NOTE — Progress Notes (Signed)
[facility] [place]                                     Cardiology Office Note:    Date:  09/05/2022   ID:  Karen Gonzales, DOB 1945-09-11, MRN [id number]  PCP:  [name], MD  [facility] Cardiologist:  Buford Dresser, MD  / Dr. Burt Knack & Dr. Tenny Craw (TAVR)   [place] Electrophysiologist:  None   Referring MD: [name], MD   [facility] s/p TAVR  History of Present Illness:    Karen Gonzales is a 77 y.o. female with a hx of  obesity (BMI 29), HTN, HLD, DMT2, nonalcoholic steatohepatitis, anticardiolipin antibody syndrome, and progressive paradoxical LFLG aortic stenosis s/p TAVR (08/29/22) who presents to clinic for follow up.   The patient was diagnosed with moderate aortic stenosis in early 2023. Six month follow up echo 06/29/22 showed EF 65%, and severe progression to severe AS with mean grad 33 mmHg, peak grad 58 mmHg, AVA 0.7 cm2, DVI 0.24, SVI 33. She complained of progressive DOE. [facility] 07/19/22 showed no angiographic evidence of CAD and severe aortic stenosis (mean gradient 45 mmHg, peak to peak gradient 48 mmHg, AVA 0.78 cm2).    The patient was evaluated by the multidisciplinary valve team and underwent successful TAVR with a 23 mm Edwards Sapien 3 Ultra Resilia THV via the TF approach on 08/28/22. Post operative echo showed EF 60%, normally functioning TAVR with a mean gradient of 12 mmHg and no PVL. She was discharged on her home baby aspirin.   Today the patient presents to clinic for follow up. No CP or SOB. No LE edema, orthopnea or PND. Has some dizziness but no syncope. Reminiscent of previous vertigo. No blood in stool or urine. No palpitations.    Past Medical History:  Diagnosis Date   Anti-cardiolipin antibody syndrome ([place]) 06/25/2011   Arthritis    Bronchitis    history of   COVID-19    Diabetes mellitus    DVT (deep venous thrombosis) ([place]) 06/25/2011   Fatty liver disease,  nonalcoholic    determinde by Ultrasound 11/11   GERD (gastroesophageal reflux disease)    H/O vitamin D deficiency    Hiatal hernia    Hyperlipidemia    Hypertension    Hyponatremia    with diuretic use   Osteopenia    PONV (postoperative nausea and vomiting)    S/P TAVR (transcatheter aortic valve replacement) 08/28/2022   s/p TAVR with a 23 mm Edwards via the TF approach by Dr. Burt Knack & Dr. Tenny Craw   Vertigo     Past Surgical History:  Procedure Laterality Date   ABDOMINAL HYSTERECTOMY     ACHILLES TENDON SURGERY     bilateral    CARPAL TUNNEL RELEASE     bilateral    CHOLECYSTECTOMY  1969   open   CRANIOTOMY  07/09/2011   Procedure: CRANIOTOMY HEMATOMA EVACUATION SUBDURAL;  Surgeon: Cooper Render Pool;  Location: [place] [facility];  Service: Neurosurgery;  Laterality: Right;  Excision of Skull Lesion-Right Right Parietal Crani w/ Resection of Skull Tumor (1 1/2 hours - to follow)   DILATION AND CURETTAGE OF UTERUS     INTRAOPERATIVE TRANSTHORACIC ECHOCARDIOGRAM N/A 08/28/2022   Procedure: INTRAOPERATIVE TRANSTHORACIC ECHOCARDIOGRAM;  Surgeon: Sherren Mocha, MD;  Location: [place];  Service: Open Heart Surgery;  Laterality: N/A;  JOINT REPLACEMENT  2004   left knee replacement    KNEE SURGERY     total of 6 surgeries on both knees    REPAIR PERONEAL TENDONS ANKLE     RIGHT/LEFT HEART CATH AND CORONARY ANGIOGRAPHY N/A 07/19/2022   Procedure: RIGHT/LEFT HEART CATH AND CORONARY ANGIOGRAPHY;  Surgeon: Burnell Blanks, MD;  Location: [place] [facility];  Service: Cardiovascular;  Laterality: N/A;   TRANSCATHETER AORTIC VALVE REPLACEMENT, TRANSFEMORAL N/A 08/28/2022   Procedure: Transcatheter Aortic Valve Replacement, Transfemoral Approach using Edwards 23 MM SAPIEN 3 Ultra;  Surgeon: Sherren Mocha, MD;  Location: [place];  Service: Open Heart Surgery;  Laterality: N/A;  Transfemoral   TUBAL LIGATION     TUMOR REMOVAL     on pelvic bone     Current Medications: Current Meds   Medication Sig   amLODipine (NORVASC) 10 MG tablet TAKE 1 TABLET BY MOUTH EVERY DAY   amoxicillin (AMOXIL) 500 MG tablet Take 4 tablets (2,000 mg total) by mouth as directed. 1 hour prior to dental work including cleanings   aspirin EC 81 MG tablet Take 1 tablet (81 mg total) by mouth daily. Swallow whole.   Blood Glucose Monitoring Suppl (ONE TOUCH ULTRA 2) w/Device KIT Pt uses glucometer to check sugars twice daily. Dx E11.9   empagliflozin (JARDIANCE) 25 MG TABS tablet Take 25 mg by mouth daily.   gabapentin (NEURONTIN) 300 MG capsule TAKE 2 CAPSULES BY MOUTH 3 TIMES A DAY   glipiZIDE (GLUCOTROL XL) 10 MG 24 hr tablet Take 10 mg by mouth daily.   glipiZIDE (GLUCOTROL XL) 5 MG 24 hr tablet Take 1 tablet (5 mg total) by mouth daily with breakfast.   Homeopathic Products (RESTFUL LEGS SL) Place 2 tablets under the tongue daily as needed (restless legs).   Lancets (ONETOUCH DELICA PLUS [id number]) MISC USE WHEN CHECKING BLOOD SUGAR AS DIRECTED   losartan (COZAAR) 50 MG tablet TAKE 2 TABLETS BY MOUTH EVERY DAY (Patient taking differently: Take 50 mg by mouth 2 (two) times daily.)   melatonin 5 MG TABS Take 5 mg by mouth at bedtime as needed (sleep).   meloxicam (MOBIC) 15 MG tablet TAKE 1 TABLET BY MOUTH EVERY DAY WITH A MEAL   Menthol-Methyl Salicylate (MUSCLE RUB) 10-15 % CREA Apply 1 Application topically as needed for muscle pain.   metFORMIN (GLUCOPHAGE) 500 MG tablet TAKE 2 TABLETS BY MOUTH 2 TIMES DAILY WITH A MEAL.   omeprazole (PRILOSEC) 40 MG capsule Take 1 capsule (40 mg total) by mouth 2 (two) times daily. (Patient taking differently: Take 40 mg by mouth daily.)   ONETOUCH ULTRA test strip CHECK BLOOD SUGAR 1-2 TIMES DAILY   oxymetazoline (AFRIN) 0.05 % nasal spray Place 1 spray into both nostrils 2 (two) times daily as needed for congestion.   rosuvastatin (CRESTOR) 10 MG tablet Take 1 tablet (10 mg total) by mouth daily. (Patient taking differently: Take 10 mg by mouth at bedtime.)    sitaGLIPtin (JANUVIA) 100 MG tablet Take 100 mg by mouth daily.     Allergies:   Reglan [metoclopramide] and Estrogens   Social History   Socioeconomic History   Marital status: Married    Spouse name: Not on file   Number of children: Not on file   Years of education: Not on file   Highest education level: Not on file  Occupational History   Not on file  Tobacco Use   Smoking status: Never   Smokeless tobacco: Never  Vaping Use  Vaping Use: Never used  Substance and Sexual Activity   Alcohol use: No   Drug use: No   Sexual activity: Not Currently  Other Topics Concern   Not on file  Social History Narrative   Not on file   Social Determinants of Health   Financial Resource Strain: Low Risk  (06/06/2022)   Overall Financial Resource Strain (CARDIA)    Difficulty of Paying Living Expenses: Not hard at all  Food Insecurity: No Food Insecurity (06/06/2022)   Hunger Vital Sign    Worried About Running Out of Food in the Last Year: Never true    Ran Out of Food in the Last Year: Never true  Transportation Needs: No Transportation Needs (06/06/2022)   PRAPARE - Hydrologist (Medical): No    Lack of Transportation (Non-Medical): No  Physical Activity: Insufficiently Active (06/06/2022)   Exercise Vital Sign    Days of Exercise per Week: 2 days    Minutes of Exercise per Session: 20 min  Stress: No Stress Concern Present (06/06/2022)   [place]    Feeling of Stress : Not at all  Social Connections: Moderately Integrated (06/06/2022)   Social Connection and Isolation Panel [NHANES]    Frequency of Communication with Friends and Family: More than three times a week    Frequency of Social Gatherings with Friends and Family: More than three times a week    Attends Religious Services: More than 4 times per year    Active Member of [employer] or Organizations: No    Attends Theatre manager Meetings: Never    Marital Status: Married     Family History: The patient's family history includes Breast cancer in her maternal grandmother and paternal aunt; Cancer in her father, maternal grandmother, and paternal grandmother; Healthy in her brother; Heart disease in her father; Hodgkin's lymphoma in her grandchild; Kidney disease in her mother; Osteoporosis in her father and mother.  ROS:   Please see the history of present illness.    All other systems reviewed and are negative.  EKGs/Labs/Other Studies Reviewed:    The following studies were reviewed today: TAVR OPERATIVE NOTE     Date of Procedure:                08/28/2022   Preoperative Diagnosis:      Severe Aortic Stenosis    Postoperative Diagnosis:    Same    Procedure:        Transcatheter Aortic Valve Replacement - Percutaneous  Transfemoral Approach             Edwards Sapien 3 Ultra Resilia THV (size 23 mm, serial # [id number] )              Co-Surgeons:                        Oswaldo Milian, MD and Sherren Mocha, MD   Anesthesiologist:                  Suzette Battiest, MD   Echocardiographer:              Jenkins Rouge, MD   Pre-operative Echo Findings: Severe aortic stenosis Normal left ventricular systolic function   Post-operative Echo Findings: No paravalvular leak Normal/unchanged left ventricular systolic function _____________     Echo 08/29/22 IMPRESSIONS   1. Left ventricular ejection fraction, by estimation, is 60  to 65%. The  left ventricle has normal function. The left ventricle has no regional  wall motion abnormalities. There is mild concentric left ventricular  hypertrophy of the basal-septal segment.  Left ventricular diastolic parameters are consistent with Grade I  diastolic dysfunction (impaired relaxation). Elevated left atrial  pressure.   2. Right ventricular systolic function is normal. The right ventricular  size is normal.   3. The mitral valve is abnormal.  Trivial mitral valve regurgitation. No  evidence of mitral stenosis.   4. Post TAVR: well positioned 23 mm Sapien 3 valve mean 12 peak 25 mmHG  AVA 2.3 VTI. No PVL. The aortic valve has been repaired/replaced. Aortic  valve regurgitation is not visualized. No aortic stenosis is present.  There is a 23 mm Sapien prosthetic  (TAVR) valve present in the aortic position. Procedure Date: 08/28/2022.  Aortic valve area, by VTI measures 2.53 cm. Aortic valve mean gradient  measures 12.0 mmHg. Aortic valve Vmax measures 2.39 m/s.   5. TAVR new compared to previous.   EKG:  EKG is ordered today.  The ekg ordered today demonstrates sinus HR 78 with PVC  Recent Labs: 04/30/2022: TSH 1.99 08/24/2022: ALT 32 08/29/2022: BUN 17; Creatinine, Ser 0.84; Hemoglobin 9.7; Magnesium 1.7; Platelets 160; Potassium 4.4; Sodium 132  Recent Lipid Panel    Component Value Date/Time   CHOL 111 04/30/2022 0951   TRIG 146.0 04/30/2022 0951   HDL 48.00 04/30/2022 0951   CHOLHDL 2 04/30/2022 0951   VLDL 29.2 04/30/2022 0951   LDLCALC 34 04/30/2022 0951   LDLCALC 75 10/24/2017 1559   LDLDIRECT 104.0 04/08/2017 1125     Risk Assessment/Calculations:     Physical Exam:    VS:  BP (!) 130/55   Pulse 81   Ht 5\' 5"  (1.651 m)   Wt 182 lb 6.4 oz (82.7 kg)   SpO2 95%   BMI 30.35 kg/m     Wt Readings from Last 3 Encounters:  09/05/22 182 lb 6.4 oz (82.7 kg)  08/29/22 183 lb 3.2 oz (83.1 kg)  08/16/22 183 lb (83 kg)     GEN:  Well nourished, well developed in no acute distress HEENT: Normal NECK: No JVD LYMPHATICS: No lymphadenopathy CARDIAC: RRR, no murmurs, rubs, gallops RESPIRATORY:  Clear to auscultation without rales, wheezing or rhonchi  ABDOMEN: Soft, non-tender, non-distended MUSCULOSKELETAL:  No edema; No deformity  SKIN: Warm and dry. Groin sites clear without hematoma or ecchymosis  NEUROLOGIC:  Alert and oriented x 3 PSYCHIATRIC:  Normal affect   ASSESSMENT:    1. S/P TAVR  (transcatheter aortic valve replacement)   2. Essential hypertension   3. Pulmonary nodule   4. Dizziness    PLAN:    In order of problems listed above:  Severe AS s/p TAVR: doing well 1 week out from TAVR. ECG with no HAVB. Groin sites healing well. SBE prophylaxis discussed; I have RX'd amoxicillin. She will be seen back 2/16 for echo and 1 month follow up.    HTN: BP initially evaluated but 130/55 on my personal re-check. No changes made today.   Pulmonary nodules: pre TAVR CT showed "small solid pulmonary nodules, largest is a 3 mm nodule of the right middle lobe. No follow-up needed if patient is low-risk (and has no known or suspected primary neoplasm). Non-contrast chest CT can be considered in 12 months if patient is high-risk." Discussed with pt and low risk and no follow up necessary.   Dizziness: reminiscent of previous vertigo.  Worse with certain head movement.  I do not think this is cardiac related.   Medication Adjustments/Labs and Tests Ordered: Current medicines are reviewed at length with the patient today.  Concerns regarding medicines are outlined above.  No orders of the defined types were placed in this encounter.  Meds ordered this encounter  Medications   amoxicillin (AMOXIL) 500 MG tablet    Sig: Take 4 tablets (2,000 mg total) by mouth as directed. 1 hour prior to dental work including cleanings    Dispense:  12 tablet    Refill:  12    Order Specific Question:   Supervising Provider    Answer:   [place], [place]    Patient Instructions  Medication Instructions:  Start Start Amoxicillin 500 mg, take 4 tablets by mouth 1 hour prior to dental procedures and cleanings.    *If you need a refill on your cardiac medications before your next appointment, please call your pharmacy*   Lab Work: None ordered   If you have labs (blood work) drawn today and your tests are completely normal, you will receive your results only by: [place] (if you  have MyChart) OR A paper copy in the mail If you have any lab test that is abnormal or we need to change your treatment, we will call you to review the results.   Testing/Procedures: None ordered    Follow-Up: Follow up as scheduled   Other Instructions     Signed, Angelena Form, PA-C  09/05/2022 2:21 PM    [facility]

## 2022-09-05 NOTE — Patient Instructions (Signed)
Medication Instructions:  Start Start Amoxicillin 500 mg, take 4 tablets by mouth 1 hour prior to dental procedures and cleanings.    *If you need a refill on your cardiac medications before your next appointment, please call your pharmacy*   Lab Work: None ordered   If you have labs (blood work) drawn today and your tests are completely normal, you will receive your results only by: [place] (if you have MyChart) OR A paper copy in the mail If you have any lab test that is abnormal or we need to change your treatment, we will call you to review the results.   Testing/Procedures: None ordered    Follow-Up: Follow up as scheduled   Other Instructions

## 2022-09-07 NOTE — Addendum Note (Signed)
Addended by: Janan Halter F on: 09/07/2022 09:08 AM   Modules accepted: Orders

## 2022-09-11 NOTE — Progress Notes (Signed)
Care Management & Coordination Services Pharmacy Note  09/19/2022 Name:  Karen Gonzales MRN:  [id number] DOB:  Oct 07, 1945  Summary: PharmD FU visit.  A1c still above goal but reports controlled FBG at home.  I am still working on her applications for [place].  In the process of correcting the application for [place] and obtaining another signature.    Recommendations/Changes made from today's visit: No changes to meds - PharmD will assist with PAP.  Follow up plan: FU 6 months CMA to FU on PAP in 14 dyas   Subjective: Karen Gonzales is an 77 y.o. year old female who is a primary patient of Birdie Riddle, Aundra Millet, MD.  The care coordination team was consulted for assistance with disease management and care coordination needs.    Engaged with patient by telephone for follow up visit.  Recent office visits:  04/30/22 Annye Asa, MD - [place] were ordered. Follow up 3-4 months.   Recent consult visits:  07/13/22 Sherren Mocha MD - Non rheumatic aortic valve stenosis - Labs were ordered. Referred to surgery for further evaluation for possible stenting. Follow up as scheduled.    07/11/22 Buford Dresser MD - Cardiology - EKG performed. Referral to Structural heart and valve clinic. Follow up in 6 months.    Hospital visits: 07/19/22 Medication Reconciliation was completed by comparing discharge summary, patient's EMR and Pharmacy list, and upon discussion with patient.   Admitted to the hospital on 07/19/22 due to Cardiac Cath. Discharge date was 07/19/22. Discharged from [place]?Medications Started at [facility] Discharge:?? None noted.    Medication Changes at Hospital Discharge: None noted.   Medications Discontinued at Hospital Discharge: None noted.    Medications that remain the same after Hospital Discharge:??  All other medications will remain the same.     Objective:  Lab Results  Component Value Date    CREATININE 0.84 08/29/2022   BUN 17 08/29/2022   GFR 67.14 07/31/2022   EGFR 86 07/16/2022   GFRNONAA >60 08/29/2022   GFRAA >90 07/03/2011   NA 132 (L) 08/29/2022   K 4.4 08/29/2022   CALCIUM 8.8 (L) 08/29/2022   CO2 22 08/29/2022   GLUCOSE 160 (H) 08/29/2022    Lab Results  Component Value Date/Time   HGBA1C 7.6 (H) 07/31/2022 09:53 AM   HGBA1C 7.5 (H) 04/30/2022 09:51 AM   GFR 67.14 07/31/2022 09:53 AM   GFR 70.22 04/30/2022 09:51 AM   MICROALBUR 6.4 (H) 01/18/2022 10:47 AM   MICROALBUR 24.8 11/04/2014 12:00 AM    Last diabetic Eye exam:  Lab Results  Component Value Date/Time   HMDIABEYEEXA No Retinopathy 06/21/2022 12:00 AM    Last diabetic Foot exam: No results found for: "HMDIABFOOTEX"   Lab Results  Component Value Date   CHOL 111 04/30/2022   HDL 48.00 04/30/2022   LDLCALC 34 04/30/2022   LDLDIRECT 104.0 04/08/2017   TRIG 146.0 04/30/2022   CHOLHDL 2 04/30/2022       Latest Ref Rng & Units 08/24/2022    9:11 AM 04/30/2022    9:51 AM 09/25/2021   10:38 AM  Hepatic Function  Total Protein 6.5 - 8.1 g/dL 8.0  8.2  7.9   Albumin 3.5 - 5.0 g/dL 4.0  4.3  4.6   AST 15 - 41 U/L 44  25  24   ALT 0 - 44 U/L 32  30  26   Alk Phosphatase 38 - 126  U/L 46  52  57   Total Bilirubin 0.3 - 1.2 mg/dL 0.4  0.5  0.4   Bilirubin, Direct 0.0 - 0.3 mg/dL  0.1  0.1     Lab Results  Component Value Date/Time   TSH 1.99 04/30/2022 09:51 AM   TSH 1.47 09/25/2021 10:38 AM       Latest Ref Rng & Units 08/29/2022    1:35 AM 08/28/2022    9:33 AM 08/28/2022    8:43 AM  CBC  WBC 4.0 - 10.5 K/uL 9.6     Hemoglobin 12.0 - 15.0 g/dL 9.7  9.5  9.5   Hematocrit 36.0 - 46.0 % 30.8  28.0  28.0   Platelets 150 - 400 K/uL 160       Lab Results  Component Value Date/Time   VD25OH 15.85 (L) 09/25/2021 10:38 AM    Clinical ASCVD: No  The ASCVD Risk score (Arnett DK, et al., 2019) failed to calculate for the following reasons:   The valid total cholesterol range is 130 to 320  mg/dL        09/14/2022    2:02 PM 07/31/2022    9:25 AM 06/06/2022    8:54 AM  Depression screen PHQ 2/9  Decreased Interest 0 1 0  Down, Depressed, Hopeless 0 0 0  PHQ - 2 Score 0 1 0  Altered sleeping 0 3 0  Tired, decreased energy 1 1 0  Change in appetite 0 0   Feeling bad or failure about yourself  0 0 0  Trouble concentrating 0 0 0  Moving slowly or fidgety/restless 0 0 0  Suicidal thoughts 0 0 0  PHQ-9 Score 1 5 0  Difficult doing work/chores Not difficult at all Not difficult at all Not difficult at all     Social History   Tobacco Use  Smoking Status Never  Smokeless Tobacco Never   BP Readings from Last 3 Encounters:  09/14/22 136/60  09/05/22 (!) 130/55  08/29/22 (!) 166/54   Pulse Readings from Last 3 Encounters:  09/14/22 75  09/05/22 81  08/29/22 77   Wt Readings from Last 3 Encounters:  09/14/22 185 lb 2 oz (84 kg)  09/05/22 182 lb 6.4 oz (82.7 kg)  08/29/22 183 lb 3.2 oz (83.1 kg)   BMI Readings from Last 3 Encounters:  09/14/22 30.81 kg/m  09/05/22 30.35 kg/m  08/29/22 30.49 kg/m    Allergies  Allergen Reactions   Reglan [Metoclopramide] Other (See Comments)    Tremor    Estrogens Rash    Topical patch caused the rash    Medications Reviewed Today     Reviewed by Buford Dresser, MD (Physician) on 09/16/22 at 2112  Med List Status: <None>   Medication Order Taking? Sig Documenting Provider Last Dose Status Informant  amLODipine (NORVASC) 10 MG tablet [id number] Yes TAKE 1 TABLET BY MOUTH EVERY DAY [name], MD Taking Active Self  amoxicillin (AMOXIL) 500 MG tablet [id number]  Take 4 tablets (2,000 mg total) by mouth as directed. 1 hour prior to dental work including cleanings Crista Luria  Active   aspirin EC 81 MG tablet [id number] Yes Take 1 tablet (81 mg total) by mouth daily. Swallow whole. Buford Dresser, MD Taking Active Self  Blood Glucose Monitoring Suppl (ONE TOUCH ULTRA 2) w/Device KIT  [id number] Yes Pt uses glucometer to check sugars twice daily. Dx E11.9 [name], MD Taking Active Self  empagliflozin (JARDIANCE) 25 MG TABS tablet [id number]  Take 25 mg by mouth daily. [provider]  Active Self  gabapentin (NEURONTIN) 300 MG capsule [id number]  TAKE 2 CAPSULES BY MOUTH 3 TIMES A DAY [name], MD  Active   glipiZIDE (GLUCOTROL XL) 10 MG 24 hr tablet [id number]  Take 10 mg by mouth daily. [provider]  Active Self  glipiZIDE (GLUCOTROL XL) 5 MG 24 hr tablet [id number] Yes Take 1 tablet (5 mg total) by mouth daily with breakfast. [name], MD Taking Active Self  Homeopathic Products (RESTFUL LEGS SL) [id number]  Place 2 tablets under the tongue daily as needed (restless legs). [provider]  Active Self  Lancets Glory Rosebush DELICA PLUS OFBPZW25E) [place] [id number] Yes USE WHEN CHECKING BLOOD SUGAR AS DIRECTED [name], MD Taking Active Self  losartan (COZAAR) 50 MG tablet [id number] Yes TAKE 2 TABLETS BY MOUTH EVERY DAY  Patient taking differently: Take 50 mg by mouth 2 (two) times daily.   [name], MD Taking Active Self  meclizine (ANTIVERT) 25 MG tablet [id number]  Take 1 tablet (25 mg total) by mouth 3 (three) times daily as needed for dizziness. [name], MD  Active   melatonin 5 MG TABS [id number]  Take 5 mg by mouth at bedtime as needed (sleep). [provider]  Active Self  meloxicam (MOBIC) 15 MG tablet [id number]  TAKE 1 TABLET BY MOUTH EVERY DAY WITH A MEAL [name], MD  Active   Menthol-Methyl Salicylate (MUSCLE RUB) 10-15 % CREA [id number]  Apply 1 Application topically as needed for muscle pain. [provider]  Active Self  metFORMIN (GLUCOPHAGE) 500 MG tablet [id number] Yes TAKE 2 TABLETS BY MOUTH 2 TIMES DAILY WITH A MEAL. [name], MD Taking Active Self  omeprazole (PRILOSEC) 40 MG capsule [id number] Yes Take 1 capsule (40 mg total) by mouth 2  (two) times daily.  Patient taking differently: Take 40 mg by mouth daily.   [name], MD Taking Active Self  ondansetron [facility]) 4 MG tablet [id number]  Take 1 tablet (4 mg total) by mouth every 8 (eight) hours as needed for nausea or vomiting. [name], MD  Active   [facility] ULTRA test strip [id number]  CHECK BLOOD SUGAR 1-2 TIMES DAILY [name], MD  Active Self  oxymetazoline (AFRIN) 0.05 % nasal spray [id number]  Place 1 spray into both nostrils 2 (two) times daily as needed for congestion. [provider]  Active Self  rosuvastatin (CRESTOR) 10 MG tablet [id number] Yes Take 1 tablet (10 mg total) by mouth daily.  Patient taking differently: Take 10 mg by mouth at bedtime.   Buford Dresser, MD Taking Active Self  sitaGLIPtin (JANUVIA) 100 MG tablet [id number] Yes Take 100 mg by mouth daily. [provider] Taking Active Self            SDOH:  (Social Determinants of Health) assessments and interventions performed: No Financial Resource Strain: Low Risk  (06/06/2022)   Overall Financial Resource Strain (CARDIA)    Difficulty of Paying Living Expenses: Not hard at all   Food Insecurity: No Food Insecurity (06/06/2022)   Hunger Vital Sign    Worried About Running Out of Food in the Last Year: Never true    [place] in the Last Year: Never true    SDOH Interventions    Flowsheet Row Clinical Support from 06/06/2022 in [place] at [place] from 06/05/2021 in [facility]  [facility] at [employer] from 06/02/2020 in [place] at [place] from 05/30/2020 in [place] at [place] Management from 12/16/2019 in [place] at [place] Interventions Intervention Not Indicated Intervention Not  Indicated -- -- --  Housing Interventions Intervention Not Indicated Intervention Not Indicated -- -- --  Transportation Interventions Intervention Not Indicated Intervention Not Indicated -- -- Other (Comment)  Depression Interventions/Treatment  -- -- Patient refuses Treatment -- --  Financial Strain Interventions Intervention Not Indicated Intervention Not Indicated -- -- --  Physical Activity Interventions Intervention Not Indicated Intervention Not Indicated -- Intervention Not Indicated  [pt has set a goal to be more active] --  Stress Interventions Intervention Not Indicated Intervention Not Indicated -- -- --  Social Connections Interventions Intervention Not Indicated Intervention Not Indicated -- -- --       Medication Assistance: Application for Jardiance and Januvia  medication assistance program. in process.  Anticipated assistance start date unknown.  See plan of care for additional detail.  Medication Access: Within the past 30 days, how often has patient missed a dose of medication? 0 Is a pillbox or other method used to improve adherence? Yes  Factors that may affect medication adherence? financial need Are meds synced by current pharmacy? No  Are meds delivered by current pharmacy? No  Does patient experience delays in picking up medications due to transportation concerns? No   [facility] Services Reviewed: Is patient disadvantaged to use [facility]?: Yes  Current Rx insurance plan: HTA Name and location of Current pharmacy:  [id number] - [place], [place] - [address] [place] [address] Phone: [phone] Fax: [phone]  [facility] services reviewed with patient today?: Yes  Patient requests to transfer care to [facility]?: No  Reason patient declined to change pharmacies: Disadvantaged due to insurance/mail order  Compliance/Adherence/Medication fill history: Care  Gaps: Due for mammogram  Star-Rating Drugs: Metformin ER 500mg  08/03/22 90ds Losartan 50mg  08/31/22 90ds Rosuvastatin 10mg  09/14/22 90ds   Assessment/Plan    Current Barriers:  Unable to maintain control of diabetes  Pharmacist Clinical Goal(s):  Patient will verbalize ability to afford treatment regimen achieve adherence to monitoring guidelines and medication adherence to achieve therapeutic efficacy contact provider office for questions/concerns as evidenced notation of same in electronic health record through collaboration with PharmD and provider.   Interventions: 1:1 collaboration with [name], MD regarding development and update of comprehensive plan of care as evidenced by provider attestation and co-signature Inter-disciplinary care team collaboration (see longitudinal plan of care) Comprehensive medication review performed; medication list updated in electronic medical record  Diabetes (A1c goal <7%) 09/19/22 -Not ideally controlled - most recent A1c is 7.6% -Current medications: Metformin 1000 mg IR twice daily with meals Appropriate, Effective, Safe, Accessible Jardiance 10 mg once daily (patient assistance) Appropriate, Effective, Safe, Accessible Januvia 100 mg once daily (patient assistance)Appropriate, Effective, Safe, Accessible Glipizide 5 mg XL once daily with breakfast Appropriate, Effective, Safe, Accessible -Current home glucose readings fasting glucose:  120-140s the past few days when she checks -Denies hypoglycemic/hyperglycemic symptoms -Current exercise: no formal exercise - same see previous -Educated on A1c and blood sugar goals; Benefits of routine self-monitoring of blood sugar; -Recommended to continue current medication Discussed her active applications for PAP.  My mistake on the Januvia  application.  I have faxed new one over to [place] to obtain signatures and place in the mail.  Patient can disregard new application  being mailed to her.  I will also reach out to Jardiance PAP program to see what needs to be adjusted on that application.  Will FU with patient on status.  FBG seems to be controlled per her home reports, we just need to keep her on her meds.  Hypertension (BP goal <140/90) 09/19/22 -Controlled -Current treatment: Amlodipine 10 mg once daily  Appropriate, Effective, Safe, Accessible Losartan 50 mg once daily  Appropriate, Effective, Safe, Accessible -Current home readings: not checking at home, machine is broken -Current dietary habits: no changes. High carb, high salt  -Current exercise habits: limited  -Denies hypotensive/hypertensive symptoms -Educated on BP goals and benefits of medications for prevention of heart attack, stroke and kidney damage; Symptoms of hypotension and importance of maintaining adequate hydration; -BP controlled last time in office.  No more episodes of dizziness reported.       Beverly Milch, PharmD Clinical Pharmacist  [facility] [phone]

## 2022-09-14 ENCOUNTER — Ambulatory Visit (INDEPENDENT_AMBULATORY_CARE_PROVIDER_SITE_OTHER): Payer: PPO | Admitting: Family Medicine

## 2022-09-14 ENCOUNTER — Encounter: Payer: Self-pay | Admitting: Family Medicine

## 2022-09-14 VITALS — BP 136/60 | HR 75 | Temp 98.0°F | Ht 65.0 in | Wt 185.1 lb

## 2022-09-14 DIAGNOSIS — R42 Dizziness and giddiness: Secondary | ICD-10-CM

## 2022-09-14 MED ORDER — ONDANSETRON HCL 4 MG PO TABS: 4.0000 mg | ORAL_TABLET | Freq: Three times a day (TID) | ORAL | 0 refills | Status: DC | PRN

## 2022-09-14 MED ORDER — MECLIZINE HCL 25 MG PO TABS: 25.0000 mg | ORAL_TABLET | Freq: Three times a day (TID) | ORAL | 0 refills | Status: AC | PRN

## 2022-09-14 NOTE — Progress Notes (Signed)
   Subjective:    Patient ID: Karen Gonzales, female    DOB: [birth year], 77 y.o.   MRN: [id number]  HPI Vertigo- pt reports since she had TAVR 2 weeks ago she has had intermittent vertigo.  Pt reports sxs will occur when she is up and moving around.  Pt reports good water intake.  Pt reports room will start spinning.  Mild nausea.  Pt has hx of vertigo.   Review of Systems For ROS see HPI     Objective:   Physical Exam Vitals reviewed.  Constitutional:      General: She is not in acute distress.    Appearance: Normal appearance. She is well-developed. She is not ill-appearing.  HENT:     Head: Normocephalic and atraumatic.     Mouth/Throat:     Pharynx: Uvula midline.  Eyes:     Conjunctiva/sclera: Conjunctivae normal.     Pupils: Pupils are equal, round, and reactive to light.     Comments: 2-3 beats of horizontal nystagmus  Cardiovascular:     Rate and Rhythm: Normal rate and regular rhythm.     Heart sounds: Normal heart sounds.  Pulmonary:     Effort: Pulmonary effort is normal. No respiratory distress.     Breath sounds: Normal breath sounds. No wheezing or rales.  Musculoskeletal:     Cervical back: Normal range of motion and neck supple.  Lymphadenopathy:     Cervical: No cervical adenopathy.  Skin:    General: Skin is warm and dry.  Neurological:     Mental Status: She is alert and oriented to person, place, and time.     Cranial Nerves: No cranial nerve deficit.     Deep Tendon Reflexes: Reflexes are normal and symmetric.  Psychiatric:        Behavior: Behavior normal.        Thought Content: Thought content normal.        Judgment: Judgment normal.           Assessment & Plan:  Vertigo- new to provider, pt has hx of similar.  Encouraged increased water intake, change positions slowly, and to use Meclizine prn.  Zofran as needed for nausea.  Handout provided for modified Epley maneuver.  Reviewed supportive care and red flags that should prompt return.   Pt expressed understanding and is in agreement w/ plan.

## 2022-09-14 NOTE — Patient Instructions (Signed)
Follow up as needed or as scheduled USE the Meclizine as needed for dizziness TAKE the Ondansetron as needed for nausea Continue to drink LOTS of water Change positions slowly to allow yourself time to adjust Do the exercises as shown to help w/ the dizziness If no improvement- or worsening- by early next week, let me know Call with any questions or concerns Hang in there!

## 2022-09-16 ENCOUNTER — Encounter (HOSPITAL_BASED_OUTPATIENT_CLINIC_OR_DEPARTMENT_OTHER): Payer: Self-pay | Admitting: Cardiology

## 2022-09-18 ENCOUNTER — Telehealth: Payer: Self-pay | Admitting: Pharmacist

## 2022-09-18 NOTE — Progress Notes (Signed)
Care Management & Coordination Services Pharmacy Team   Reason for Encounter: Appointment Reminder  Contacted patient to confirm telephone appointment with Leata Mouse, PharmD on 09/19/22 at 12:00pm. Unsuccessful outreach. Left voicemail for patient to return call. I left detailed information about appointment info for patient.  [facility], [facility]

## 2022-09-19 ENCOUNTER — Ambulatory Visit: Payer: PPO | Admitting: Pharmacist

## 2022-09-25 NOTE — Progress Notes (Signed)
[facility] [place]                                     Cardiology Office Note:    Date:  09/28/2022   ID:  JORY WELKE, DOB [birth year], MRN [id number]  PCP:  [name], MD  [facility] Cardiologist:  Buford Dresser, MD / Dr. Burt Knack & Dr. Tenny Craw (TAVR)  [facility] Electrophysiologist:  None   Referring MD: [name], MD   Chief Complaint  Patient presents with   Follow-up    1 month s/p TAVR   History of Present Illness:    Karen Gonzales is a 77 y.o. female with a hx of obesity (BMI 29), HTN, HLD, DMT2, nonalcoholic steatohepatitis, anticardiolipin antibody syndrome, and progressive paradoxical LFLG aortic stenosis s/p TAVR (08/29/22) who presents to clinic for follow up.    The patient was found to have moderate aortic stenosis in early 2023. At her 6 month follow up echo 06/29/22 showed EF 65%, and severe progression to severe AS with mean grad 33 mmHg, peak grad 58 mmHg, AVA 0.7 cm2, DVI 0.24, SVI 33. She complained of progressive DOE. [facility] 07/19/22 showed no angiographic evidence of CAD and severe aortic stenosis (mean gradient 45 mmHg, peak to peak gradient 48 mmHg, AVA 0.78 cm2).    The patient was evaluated by the multidisciplinary valve team and underwent successful TAVR with a 23 mm Edwards Sapien 3 Ultra Resilia THV via the TF approach on 08/28/22. Post operative echo showed EF 60%, normally functioning TAVR with a mean gradient of 12 mmHg and no PVL. She was discharged on her home baby aspirin.    She is here today and reports that she continues to do very well since her procedure with the exception of intermittent dizziness felt to be secondary to prior vertigo. She does state that this has improved slightly since last OV. Her breathing has improved and she has no other complaints. She denies CP and SOB. No LE edema, orthopnea or PND. No blood in stool or urine. No palpitations.   Past Medical  History:  Diagnosis Date   Anti-cardiolipin antibody syndrome ([place]) 06/25/2011   Arthritis    Bronchitis    history of   COVID-19    Diabetes mellitus    DVT (deep venous thrombosis) ([place]) 06/25/2011   Fatty liver disease, nonalcoholic    determinde by Ultrasound 11/11   GERD (gastroesophageal reflux disease)    H/O vitamin D deficiency    Hiatal hernia    Hyperlipidemia    Hypertension    Hyponatremia    with diuretic use   Osteopenia    PONV (postoperative nausea and vomiting)    S/P TAVR (transcatheter aortic valve replacement) 08/28/2022   s/p TAVR with a 23 mm Edwards via the TF approach by Dr. Burt Knack & Dr. Tenny Craw   Vertigo     Past Surgical History:  Procedure Laterality Date   ABDOMINAL HYSTERECTOMY     ACHILLES TENDON SURGERY     bilateral    CARPAL TUNNEL RELEASE     bilateral    CHOLECYSTECTOMY  1969   open   CRANIOTOMY  07/09/2011   Procedure: CRANIOTOMY HEMATOMA EVACUATION SUBDURAL;  Surgeon: Cooper Render Pool;  Location: [place] [facility];  Service: Neurosurgery;  Laterality: Right;  Excision of Skull Lesion-Right Right Parietal Crani w/ Resection of  Skull Tumor (1 1/2 hours - to follow)   [place] ECHOCARDIOGRAM N/A 08/28/2022   Procedure: INTRAOPERATIVE TRANSTHORACIC ECHOCARDIOGRAM;  Surgeon: Sherren Mocha, MD;  Location: [place];  Service: Open Heart Surgery;  Laterality: N/A;   JOINT REPLACEMENT  2004   left knee replacement    KNEE SURGERY     total of 6 surgeries on both knees    REPAIR PERONEAL TENDONS ANKLE     RIGHT/LEFT HEART CATH AND CORONARY ANGIOGRAPHY N/A 07/19/2022   Procedure: RIGHT/LEFT HEART CATH AND CORONARY ANGIOGRAPHY;  Surgeon: Burnell Blanks, MD;  Location: [place] [facility];  Service: Cardiovascular;  Laterality: N/A;   TRANSCATHETER AORTIC VALVE REPLACEMENT, TRANSFEMORAL N/A 08/28/2022   Procedure: Transcatheter Aortic Valve Replacement, Transfemoral Approach using Edwards 23 MM  SAPIEN 3 Ultra;  Surgeon: Sherren Mocha, MD;  Location: [place];  Service: Open Heart Surgery;  Laterality: N/A;  Transfemoral   TUBAL LIGATION     TUMOR REMOVAL     on pelvic bone     Current Medications: Current Meds  Medication Sig   amLODipine (NORVASC) 10 MG tablet TAKE 1 TABLET BY MOUTH EVERY DAY   amoxicillin (AMOXIL) 500 MG tablet Take 4 tablets (2,000 mg total) by mouth as directed. 1 hour prior to dental work including cleanings   aspirin EC 81 MG tablet Take 1 tablet (81 mg total) by mouth daily. Swallow whole.   Blood Glucose Monitoring Suppl (ONE TOUCH ULTRA 2) w/Device KIT Pt uses glucometer to check sugars twice daily. Dx E11.9   empagliflozin (JARDIANCE) 25 MG TABS tablet Take 25 mg by mouth daily.   gabapentin (NEURONTIN) 300 MG capsule TAKE 2 CAPSULES BY MOUTH 3 TIMES A DAY   glipiZIDE (GLUCOTROL XL) 10 MG 24 hr tablet Take 10 mg by mouth daily.   Homeopathic Products (RESTFUL LEGS SL) Place 2 tablets under the tongue daily as needed (restless legs).   Lancets (ONETOUCH DELICA PLUS [id number]) MISC USE WHEN CHECKING BLOOD SUGAR AS DIRECTED   losartan (COZAAR) 50 MG tablet TAKE 2 TABLETS BY MOUTH EVERY DAY (Patient taking differently: Take 50 mg by mouth 2 (two) times daily.)   meclizine (ANTIVERT) 25 MG tablet Take 1 tablet (25 mg total) by mouth 3 (three) times daily as needed for dizziness.   melatonin 5 MG TABS Take 5 mg by mouth at bedtime as needed (sleep).   meloxicam (MOBIC) 15 MG tablet TAKE 1 TABLET BY MOUTH EVERY DAY WITH A MEAL   Menthol-Methyl Salicylate (MUSCLE RUB) 10-15 % CREA Apply 1 Application topically as needed for muscle pain.   metFORMIN (GLUCOPHAGE) 500 MG tablet TAKE 2 TABLETS BY MOUTH 2 TIMES DAILY WITH A MEAL.   omeprazole (PRILOSEC) 40 MG capsule Take 1 capsule (40 mg total) by mouth 2 (two) times daily. (Patient taking differently: Take 40 mg by mouth daily.)   ONETOUCH ULTRA test strip CHECK BLOOD SUGAR 1-2 TIMES DAILY   oxymetazoline (AFRIN)  0.05 % nasal spray Place 1 spray into both nostrils 2 (two) times daily as needed for congestion.   rosuvastatin (CRESTOR) 10 MG tablet Take 1 tablet (10 mg total) by mouth daily. (Patient taking differently: Take 10 mg by mouth at bedtime.)   sitaGLIPtin (JANUVIA) 100 MG tablet Take 100 mg by mouth daily.     Allergies:   Reglan [metoclopramide] and Estrogens   Social History   Socioeconomic History   Marital status: Married    Spouse name:  Not on file   Number of children: Not on file   Years of education: Not on file   Highest education level: Not on file  Occupational History   Not on file  Tobacco Use   Smoking status: Never   Smokeless tobacco: Never  Vaping Use   Vaping Use: Never used  Substance and Sexual Activity   Alcohol use: No   Drug use: No   Sexual activity: Not Currently  Other Topics Concern   Not on file  Social History Narrative   Not on file   Social Determinants of Health   Financial Resource Strain: Low Risk  (06/06/2022)   Overall Financial Resource Strain (CARDIA)    Difficulty of Paying Living Expenses: Not hard at all  Food Insecurity: No Food Insecurity (06/06/2022)   Hunger Vital Sign    Worried About Running Out of Food in the Last Year: Never true    Ran Out of Food in the Last Year: Never true  Transportation Needs: No Transportation Needs (06/06/2022)   PRAPARE - Hydrologist (Medical): No    Lack of Transportation (Non-Medical): No  Physical Activity: Insufficiently Active (06/06/2022)   Exercise Vital Sign    Days of Exercise per Week: 2 days    Minutes of Exercise per Session: 20 min  Stress: No Stress Concern Present (06/06/2022)   [place]    Feeling of Stress : Not at all  Social Connections: Moderately Integrated (06/06/2022)   Social Connection and Isolation Panel [NHANES]    Frequency of Communication with Friends and Family:  More than three times a week    Frequency of Social Gatherings with Friends and Family: More than three times a week    Attends Religious Services: More than 4 times per year    Active Member of [employer] or Organizations: No    Attends Archivist Meetings: Never    Marital Status: Married     Family History: The patient's family history includes Breast cancer in her maternal grandmother and paternal aunt; Cancer in her father, maternal grandmother, and paternal grandmother; Healthy in her brother; Heart disease in her father; Hodgkin's lymphoma in her grandchild; Kidney disease in her mother; Osteoporosis in her father and mother.  ROS:   Please see the history of present illness.    All other systems reviewed and are negative.  EKGs/Labs/Other Studies Reviewed:    The following studies were reviewed today:  TAVR OPERATIVE NOTE     Date of Procedure:                08/28/2022   Preoperative Diagnosis:      Severe Aortic Stenosis    Postoperative Diagnosis:    Same    Procedure:        Transcatheter Aortic Valve Replacement - Percutaneous  Transfemoral Approach             Edwards Sapien 3 Ultra Resilia THV (size 23 mm, serial # [id number] )              Co-Surgeons:                        Oswaldo Milian, MD and Sherren Mocha, MD   Anesthesiologist:                  Suzette Battiest, MD   Echocardiographer:  Jenkins Rouge, MD   Pre-operative Echo Findings: Severe aortic stenosis Normal left ventricular systolic function   Post-operative Echo Findings: No paravalvular leak Normal/unchanged left ventricular systolic function _____________     Echo 08/29/22 IMPRESSIONS   1. Left ventricular ejection fraction, by estimation, is 60 to 65%. The  left ventricle has normal function. The left ventricle has no regional  wall motion abnormalities. There is mild concentric left ventricular  hypertrophy of the basal-septal segment.  Left ventricular diastolic  parameters are consistent with Grade I  diastolic dysfunction (impaired relaxation). Elevated left atrial  pressure.   2. Right ventricular systolic function is normal. The right ventricular  size is normal.   3. The mitral valve is abnormal. Trivial mitral valve regurgitation. No  evidence of mitral stenosis.   4. Post TAVR: well positioned 23 mm Sapien 3 valve mean 12 peak 25 mmHG  AVA 2.3 VTI. No PVL. The aortic valve has been repaired/replaced. Aortic  valve regurgitation is not visualized. No aortic stenosis is present.  There is a 23 mm Sapien prosthetic  (TAVR) valve present in the aortic position. Procedure Date: 08/28/2022.  Aortic valve area, by VTI measures 2.53 cm. Aortic valve mean gradient  measures 12.0 mmHg. Aortic valve Vmax measures 2.39 m/s.   5. TAVR new compared to previous.   EKG:  EKG is not ordered today.    Recent Labs: 04/30/2022: TSH 1.99 08/24/2022: ALT 32 08/29/2022: BUN 17; Creatinine, Ser 0.84; Hemoglobin 9.7; Magnesium 1.7; Platelets 160; Potassium 4.4; Sodium 132  Recent Lipid Panel    Component Value Date/Time   CHOL 111 04/30/2022 0951   TRIG 146.0 04/30/2022 0951   HDL 48.00 04/30/2022 0951   CHOLHDL 2 04/30/2022 0951   VLDL 29.2 04/30/2022 0951   LDLCALC 34 04/30/2022 0951   LDLCALC 75 10/24/2017 1559   LDLDIRECT 104.0 04/08/2017 1125   Physical Exam:    VS:  BP 138/62   Pulse 76   Ht 5\' 5"  (1.651 m)   Wt 184 lb (83.5 kg)   SpO2 98%   BMI 30.62 kg/m     Wt Readings from Last 3 Encounters:  09/28/22 184 lb (83.5 kg)  09/14/22 185 lb 2 oz (84 kg)  09/05/22 182 lb 6.4 oz (82.7 kg)    General: Well developed, well nourished, NAD Lungs:Clear to ausculation bilaterally. No wheezes, rales, or rhonchi. Breathing is unlabored. Cardiovascular: RRR with S1 S2. No murmurs Extremities: No edema.  Neuro: Alert and oriented. No focal deficits. No facial asymmetry. MAE spontaneously. Psych: Responds to questions appropriately with normal  affect.    ASSESSMENT/PLAN:    Severe AS s/p TAVR: Patient doing well with NYHA class I symptoms s/p TAVR. Echocardiogram today with a 23 mm Edwards Sapien THV, mean gradient 12 mmHg, EOA 1.95 cm^2, and no significant peri-valvular leak. Continue ASA. She will require dental SBE prophylaxis with amoxicillin. Plan one year follow up with echocardiogram.    HTN: Stable with no changes needed today.    Pulmonary nodules: pre TAVR CT showed "small solid pulmonary nodules, largest is a 3 mm nodule of the right middle lobe. No follow-up needed if patient is low-risk (and has no known or suspected primary neoplasm). Non-contrast chest CT can be considered in 12 months if patient is high-risk." Discussed with pt and low risk and no follow up necessary.    Dizziness: Improved from last OV. Seen by PCP with no other recommendations. Meclizine helps but makes her sleepy. Not felt to be cardiac  related.   Medication Adjustments/Labs and Tests Ordered: Current medicines are reviewed at length with the patient today.  Concerns regarding medicines are outlined above.  No orders of the defined types were placed in this encounter.  No orders of the defined types were placed in this encounter.   Patient Instructions  Medication Instructions:  Your physician recommends that you continue on your current medications as directed. Please refer to the Current Medication list given to you today.  *If you need a refill on your cardiac medications before your next appointment, please call your pharmacy*   Lab Work: NONE If you have labs (blood work) drawn today and your tests are completely normal, you will receive your results only by: [place] (if you have MyChart) OR A paper copy in the mail If you have any lab test that is abnormal or we need to change your treatment, we will call you to review the results.   Testing/Procedures: NONE   Follow-Up: At [facility], you and your health  needs are our priority.  As part of our continuing mission to provide you with exceptional heart care, we have created designated Provider Care Teams.  These Care Teams include your primary Cardiologist (physician) and Advanced Practice Providers (APPs -  Physician Assistants and Nurse Practitioners) who all work together to provide you with the care you need, when you need it.  We recommend signing up for the patient portal called "MyChart".  Sign up information is provided on this After Visit Summary.  MyChart is used to connect with patients for Virtual Visits (Telemedicine).  Patients are able to view lab/test results, encounter notes, upcoming appointments, etc.  Non-urgent messages can be sent to your provider as well.   To learn more about what you can do with MyChart, go to [email or web address].    Your next appointment:   KEEP SCHEDULED FOLLOW-UP   Signed, Kathyrn Drown, NP  09/28/2022 2:11 PM    [facility]

## 2022-09-27 ENCOUNTER — Telehealth: Payer: Self-pay

## 2022-09-27 ENCOUNTER — Other Ambulatory Visit (HOSPITAL_COMMUNITY): Payer: Self-pay

## 2022-09-27 NOTE — Telephone Encounter (Signed)
I call pt and spoke to pt about approval for Jardiance.   Received notification from [employer]) regarding approval for [employer].   Patient assistance approved from 09/27/2022 to 08/13/2023.   Phone: [place] [employer] Patient Advocate

## 2022-09-28 ENCOUNTER — Ambulatory Visit: Payer: PPO | Attending: Cardiology

## 2022-09-28 ENCOUNTER — Other Ambulatory Visit: Payer: Self-pay | Admitting: Cardiology

## 2022-09-28 ENCOUNTER — Ambulatory Visit (INDEPENDENT_AMBULATORY_CARE_PROVIDER_SITE_OTHER): Payer: PPO | Admitting: Cardiology

## 2022-09-28 ENCOUNTER — Other Ambulatory Visit: Payer: Self-pay | Admitting: Family Medicine

## 2022-09-28 VITALS — BP 138/62 | HR 76 | Ht 65.0 in | Wt 184.0 lb

## 2022-09-28 DIAGNOSIS — R42 Dizziness and giddiness: Secondary | ICD-10-CM | POA: Insufficient documentation

## 2022-09-28 DIAGNOSIS — Z1231 Encounter for screening mammogram for malignant neoplasm of breast: Secondary | ICD-10-CM

## 2022-09-28 DIAGNOSIS — R911 Solitary pulmonary nodule: Secondary | ICD-10-CM | POA: Diagnosis not present

## 2022-09-28 DIAGNOSIS — I10 Essential (primary) hypertension: Secondary | ICD-10-CM | POA: Insufficient documentation

## 2022-09-28 DIAGNOSIS — Z952 Presence of prosthetic heart valve: Secondary | ICD-10-CM

## 2022-09-28 DIAGNOSIS — I350 Nonrheumatic aortic (valve) stenosis: Secondary | ICD-10-CM

## 2022-09-28 LAB — ECHOCARDIOGRAM COMPLETE
AR max vel: 1.99 cm2
AV Area VTI: 1.95 cm2
AV Area mean vel: 2.03 cm2
AV Mean grad: 12 mmHg
AV Peak grad: 22.5 mmHg
Ao pk vel: 2.37 m/s
Area-P 1/2: 3.42 cm2
S' Lateral: 2.8 cm

## 2022-09-28 NOTE — Patient Instructions (Signed)
Medication Instructions:  Your physician recommends that you continue on your current medications as directed. Please refer to the Current Medication list given to you today.  *If you need a refill on your cardiac medications before your next appointment, please call your pharmacy*   Lab Work: NONE If you have labs (blood work) drawn today and your tests are completely normal, you will receive your results only by: [place] (if you have MyChart) OR A paper copy in the mail If you have any lab test that is abnormal or we need to change your treatment, we will call you to review the results.   Testing/Procedures: NONE   Follow-Up: At [facility], you and your health needs are our priority.  As part of our continuing mission to provide you with exceptional heart care, we have created designated Provider Care Teams.  These Care Teams include your primary Cardiologist (physician) and Advanced Practice Providers (APPs -  Physician Assistants and Nurse Practitioners) who all work together to provide you with the care you need, when you need it.  We recommend signing up for the patient portal called "MyChart".  Sign up information is provided on this After Visit Summary.  MyChart is used to connect with patients for Virtual Visits (Telemedicine).  Patients are able to view lab/test results, encounter notes, upcoming appointments, etc.  Non-urgent messages can be sent to your provider as well.   To learn more about what you can do with MyChart, go to [email or web address].    Your next appointment:   KEEP SCHEDULED FOLLOW-UP

## 2022-10-01 ENCOUNTER — Ambulatory Visit
Admission: RE | Admit: 2022-10-01 | Discharge: 2022-10-01 | Disposition: A | Discharge status: Home / Self Care | Payer: PPO | Source: Ambulatory Visit | Attending: Family Medicine | Admitting: Family Medicine

## 2022-10-01 ENCOUNTER — Ambulatory Visit: Payer: PPO

## 2022-10-01 DIAGNOSIS — Z1231 Encounter for screening mammogram for malignant neoplasm of breast: Secondary | ICD-10-CM

## 2022-10-07 ENCOUNTER — Other Ambulatory Visit: Payer: Self-pay | Admitting: Family Medicine

## 2022-10-09 ENCOUNTER — Telehealth: Payer: Self-pay | Admitting: Pharmacist

## 2022-10-09 NOTE — Progress Notes (Signed)
Called [employer] to check on status of patient's Januvia PAP. Per rep patient was denied as previous application was written as [name] on the form. Leata Mouse, CPP mailed another updated application with corrected medication in already. Per rep they were about 10-14 days behind on processing and recommended another application be mailed with original MD signature as well. Update CPP on status of application and per CPP will send in another application just in case for patient.   [facility]

## 2022-11-05 ENCOUNTER — Other Ambulatory Visit: Payer: Self-pay | Admitting: Family Medicine

## 2022-11-05 ENCOUNTER — Other Ambulatory Visit (HOSPITAL_BASED_OUTPATIENT_CLINIC_OR_DEPARTMENT_OTHER): Payer: Self-pay | Admitting: Cardiology

## 2022-11-05 DIAGNOSIS — E1140 Type 2 diabetes mellitus with diabetic neuropathy, unspecified: Secondary | ICD-10-CM

## 2022-11-05 DIAGNOSIS — E782 Mixed hyperlipidemia: Secondary | ICD-10-CM

## 2022-11-05 NOTE — Telephone Encounter (Signed)
Patient is requesting a refill of the following medications: Requested Prescriptions   Pending Prescriptions Disp Refills   glipiZIDE (GLUCOTROL XL) 10 MG 24 hr tablet [Pharmacy Med Name: GLIPIZIDE ER 10 MG TABLET] 90 tablet 1    Sig: TAKE 1 TABLET (10 MG TOTAL) BY MOUTH DAILY WITH BREAKFAST.   Vitamin D, Ergocalciferol, (DRISDOL) 1.25 MG (50000 UNIT) CAPS capsule [Pharmacy Med Name: VITAMIN D2 1.25MG (50,000 UNIT)] 12 capsule 0    Sig: TAKE 1 CAPSULE (50,000 UNITS TOTAL) BY MOUTH EVERY 7 (SEVEN) DAYS   meloxicam (MOBIC) 15 MG tablet [Pharmacy Med Name: MELOXICAM 15 MG TABLET] 30 tablet 0    Sig: TAKE 1 TABLET BY MOUTH EVERY DAY WITH A MEAL   ondansetron (ZOFRAN) 4 MG tablet [Pharmacy Med Name: ONDANSETRON HCL 4 MG TABLET] 20 tablet 0    Sig: TAKE 1 TABLET BY MOUTH EVERY 8 HOURS AS NEEDED FOR NAUSEA AND VOMITING    Date of patient request: 11/05/22 Last office visit: 09/14/22 Date of last refill: 10/08/22 Last refill amount: 12, 20, 30

## 2022-11-05 NOTE — Telephone Encounter (Signed)
Rx request sent to pharmacy.  

## 2022-11-06 ENCOUNTER — Other Ambulatory Visit: Payer: Self-pay

## 2022-11-06 MED ORDER — EMPAGLIFLOZIN 25 MG PO TABS: 25.0000 mg | ORAL_TABLET | Freq: Every day | ORAL | 1 refills | Status: DC

## 2022-11-06 MED ORDER — GLIPIZIDE ER 10 MG PO TB24: 10.0000 mg | ORAL_TABLET | Freq: Every day | ORAL | 3 refills | Status: DC

## 2022-11-12 MED ORDER — MELOXICAM 15 MG PO TABS: ORAL_TABLET | ORAL | 0 refills | Status: DC

## 2022-11-12 MED ORDER — ONDANSETRON HCL 4 MG PO TABS: 4.0000 mg | ORAL_TABLET | Freq: Three times a day (TID) | ORAL | 0 refills | Status: DC | PRN

## 2022-11-12 NOTE — Addendum Note (Signed)
Addended by: [name] on: 11/12/2022 05:52 PM   Modules accepted: Orders

## 2022-11-12 NOTE — Telephone Encounter (Signed)
Refilled Meloxicam and Ondansetron as requested

## 2022-11-24 ENCOUNTER — Other Ambulatory Visit: Payer: Self-pay | Admitting: Family Medicine

## 2022-11-24 DIAGNOSIS — E1140 Type 2 diabetes mellitus with diabetic neuropathy, unspecified: Secondary | ICD-10-CM

## 2022-11-26 ENCOUNTER — Telehealth: Payer: Self-pay | Admitting: Family Medicine

## 2022-11-26 NOTE — Telephone Encounter (Signed)
Contacted [name] to schedule their annual wellness visit. Appointment made for 11/28/2022.  Thank you,  [facility] Support [facility] Direct dial  [phone]

## 2022-11-28 ENCOUNTER — Ambulatory Visit (INDEPENDENT_AMBULATORY_CARE_PROVIDER_SITE_OTHER): Payer: PPO | Admitting: *Deleted

## 2022-11-28 DIAGNOSIS — Z0000 Encounter for general adult medical examination without abnormal findings: Secondary | ICD-10-CM | POA: Diagnosis not present

## 2022-11-28 NOTE — Patient Instructions (Signed)
Karen Gonzales , Thank you for taking time to come for your Medicare Wellness Visit. I appreciate your ongoing commitment to your health goals. Please review the following plan we discussed and let me know if I can assist you in the future.   Screening recommendations/referrals: Colonoscopy: no longer required Mammogram: up to date Bone Density: up to date Recommended yearly ophthalmology/optometry visit for glaucoma screening and checkup Recommended yearly dental visit for hygiene and checkup  Vaccinations: Influenza vaccine: up to date Pneumococcal vaccine: up to date Tdap vaccine: Education provided Shingles vaccine: Education provided    Advanced directives: Education provided       Preventive Care 65 Years and Older, Female Preventive care refers to lifestyle choices and visits with your health care provider that can promote health and wellness. What does preventive care include? A yearly physical exam. This is also called an annual well check. Dental exams once or twice a year. Routine eye exams. Ask your health care provider how often you should have your eyes checked. Personal lifestyle choices, including: Daily care of your teeth and gums. Regular physical activity. Eating a healthy diet. Avoiding tobacco and drug use. Limiting alcohol use. Practicing safe sex. Taking low-dose aspirin every day. Taking vitamin and mineral supplements as recommended by your health care provider. What happens during an annual well check? The services and screenings done by your health care provider during your annual well check will depend on your age, overall health, lifestyle risk factors, and family history of disease. Counseling  Your health care provider may ask you questions about your: Alcohol use. Tobacco use. Drug use. Emotional well-being. Home and relationship well-being. Sexual activity. Eating habits. History of falls. Memory and ability to understand  (cognition). Work and work Astronomer. Reproductive health. Screening  You may have the following tests or measurements: Height, weight, and BMI. Blood pressure. Lipid and cholesterol levels. These may be checked every 5 years, or more frequently if you are over 78 years old. Skin check. Lung cancer screening. You may have this screening every year starting at age 42 if you have a 30-pack-year history of smoking and currently smoke or have quit within the past 15 years. Fecal occult blood test (FOBT) of the stool. You may have this test every year starting at age 21. Flexible sigmoidoscopy or colonoscopy. You may have a sigmoidoscopy every 5 years or a colonoscopy every 10 years starting at age 72. Hepatitis C blood test. Hepatitis B blood test. Sexually transmitted disease (STD) testing. Diabetes screening. This is done by checking your blood sugar (glucose) after you have not eaten for a while (fasting). You may have this done every 1-3 years. Bone density scan. This is done to screen for osteoporosis. You may have this done starting at age 79. Mammogram. This may be done every 1-2 years. Talk to your health care provider about how often you should have regular mammograms. Talk with your health care provider about your test results, treatment options, and if necessary, the need for more tests. Vaccines  Your health care provider may recommend certain vaccines, such as: Influenza vaccine. This is recommended every year. Tetanus, diphtheria, and acellular pertussis (Tdap, Td) vaccine. You may need a Td booster every 10 years. Zoster vaccine. You may need this after age 53. Pneumococcal 13-valent conjugate (PCV13) vaccine. One dose is recommended after age 38. Pneumococcal polysaccharide (PPSV23) vaccine. One dose is recommended after age 49. Talk to your health care provider about which screenings and vaccines you need and  how often you need them. This information is not intended to  replace advice given to you by your health care provider. Make sure you discuss any questions you have with your health care provider. Document Released: 08/26/2015 Document Revised: 04/18/2016 Document Reviewed: 05/31/2015 Elsevier Interactive Patient Education  2017 [place] Prevention in the Home Falls can cause injuries. They can happen to people of all ages. There are many things you can do to make your home safe and to help prevent falls. What can I do on the outside of my home? Regularly fix the edges of walkways and driveways and fix any cracks. Remove anything that might make you trip as you walk through a door, such as a raised step or threshold. Trim any bushes or trees on the path to your home. Use bright outdoor lighting. Clear any walking paths of anything that might make someone trip, such as rocks or tools. Regularly check to see if handrails are loose or broken. Make sure that both sides of any steps have handrails. Any raised decks and porches should have guardrails on the edges. Have any leaves, snow, or ice cleared regularly. Use sand or salt on walking paths during winter. Clean up any spills in your garage right away. This includes oil or grease spills. What can I do in the bathroom? Use night lights. Install grab bars by the toilet and in the tub and shower. Do not use towel bars as grab bars. Use non-skid mats or decals in the tub or shower. If you need to sit down in the shower, use a plastic, non-slip stool. Keep the floor dry. Clean up any water that spills on the floor as soon as it happens. Remove soap buildup in the tub or shower regularly. Attach bath mats securely with double-sided non-slip rug tape. Do not have throw rugs and other things on the floor that can make you trip. What can I do in the bedroom? Use night lights. Make sure that you have a light by your bed that is easy to reach. Do not use any sheets or blankets that are too big for  your bed. They should not hang down onto the floor. Have a firm chair that has side arms. You can use this for support while you get dressed. Do not have throw rugs and other things on the floor that can make you trip. What can I do in the kitchen? Clean up any spills right away. Avoid walking on wet floors. Keep items that you use a lot in easy-to-reach places. If you need to reach something above you, use a strong step stool that has a grab bar. Keep electrical cords out of the way. Do not use floor polish or wax that makes floors slippery. If you must use wax, use non-skid floor wax. Do not have throw rugs and other things on the floor that can make you trip. What can I do with my stairs? Do not leave any items on the stairs. Make sure that there are handrails on both sides of the stairs and use them. Fix handrails that are broken or loose. Make sure that handrails are as long as the stairways. Check any carpeting to make sure that it is firmly attached to the stairs. Fix any carpet that is loose or worn. Avoid having throw rugs at the top or bottom of the stairs. If you do have throw rugs, attach them to the floor with carpet tape. Make sure that you have  a light switch at the top of the stairs and the bottom of the stairs. If you do not have them, ask someone to add them for you. What else can I do to help prevent falls? Wear shoes that: Do not have high heels. Have rubber bottoms. Are comfortable and fit you well. Are closed at the toe. Do not wear sandals. If you use a stepladder: Make sure that it is fully opened. Do not climb a closed stepladder. Make sure that both sides of the stepladder are locked into place. Ask someone to hold it for you, if possible. Clearly mark and make sure that you can see: Any grab bars or handrails. First and last steps. Where the edge of each step is. Use tools that help you move around (mobility aids) if they are needed. These  include: Canes. Walkers. Scooters. Crutches. Turn on the lights when you go into a dark area. Replace any light bulbs as soon as they burn out. Set up your furniture so you have a clear path. Avoid moving your furniture around. If any of your floors are uneven, fix them. If there are any pets around you, be aware of where they are. Review your medicines with your doctor. Some medicines can make you feel dizzy. This can increase your chance of falling. Ask your doctor what other things that you can do to help prevent falls. This information is not intended to replace advice given to you by your health care provider. Make sure you discuss any questions you have with your health care provider. Document Released: 05/26/2009 Document Revised: 01/05/2016 Document Reviewed: 09/03/2014 Elsevier Interactive Patient Education  2017 [employer].

## 2022-11-28 NOTE — Progress Notes (Signed)
Subjective:   Karen Gonzales is a 77 y.o. female who presents for Medicare Annual (Subsequent) preventive examination.  I connected with  [name] on 11/28/22 by a telephone enabled telemedicine application and verified that I am speaking with the correct person using two identifiers.   I discussed the limitations of evaluation and management by telemedicine. The patient expressed understanding and agreed to proceed.  Patient location: home  Provider location: telephone/home    Review of Systems     Cardiac Risk Factors include: advanced age (>39men, >70 women);family history of premature cardiovascular disease;diabetes mellitus;hypertension;obesity (BMI >30kg/m2);sedentary lifestyle     Objective:    Today's Vitals   There is no height or weight on file to calculate BMI.     11/28/2022    9:23 AM 08/28/2022    6:03 AM 07/19/2022    5:58 AM 06/06/2022    8:55 AM 06/05/2021   11:21 AM 05/30/2020   11:21 AM 01/18/2020   11:59 AM  Advanced Directives  Does Patient Have a Medical Advance Directive? No No No No No No No  Would patient like information on creating a medical advance directive? No - Patient declined No - Patient declined No - Patient declined No - Patient declined No - Patient declined No - Patient declined No - Patient declined    Current Medications (verified) Outpatient Encounter Medications as of 11/28/2022  Medication Sig   amLODipine (NORVASC) 10 MG tablet TAKE 1 TABLET BY MOUTH EVERY DAY   amoxicillin (AMOXIL) 500 MG tablet Take 4 tablets (2,000 mg total) by mouth as directed. 1 hour prior to dental work including cleanings   aspirin EC 81 MG tablet Take 1 tablet (81 mg total) by mouth daily. Swallow whole.   Blood Glucose Monitoring Suppl (ONE TOUCH ULTRA 2) w/Device KIT Pt uses glucometer to check sugars twice daily. Dx E11.9   empagliflozin (JARDIANCE) 25 MG TABS tablet Take 1 tablet (25 mg total) by mouth daily.   gabapentin (NEURONTIN) 300 MG  capsule TAKE 2 CAPSULES BY MOUTH 3 TIMES A DAY   glipiZIDE (GLUCOTROL XL) 10 MG 24 hr tablet TAKE 1 TABLET BY MOUTH EVERY DAY   Homeopathic Products (RESTFUL LEGS SL) Place 2 tablets under the tongue daily as needed (restless legs).   Lancets (ONETOUCH DELICA PLUS LANCET33G) MISC USE WHEN CHECKING BLOOD SUGAR AS DIRECTED   losartan (COZAAR) 50 MG tablet TAKE 2 TABLETS BY MOUTH EVERY DAY (Patient taking differently: Take 50 mg by mouth 2 (two) times daily.)   meclizine (ANTIVERT) 25 MG tablet Take 1 tablet (25 mg total) by mouth 3 (three) times daily as needed for dizziness.   melatonin 5 MG TABS Take 5 mg by mouth at bedtime as needed (sleep).   meloxicam (MOBIC) 15 MG tablet TAKE 1 TABLET BY MOUTH EVERY DAY WITH A MEAL   Menthol-Methyl Salicylate (MUSCLE RUB) 10-15 % CREA Apply 1 Application topically as needed for muscle pain.   metFORMIN (GLUCOPHAGE) 500 MG tablet TAKE 2 TABLETS BY MOUTH 2 TIMES DAILY WITH A MEAL.   omeprazole (PRILOSEC) 40 MG capsule Take 1 capsule (40 mg total) by mouth 2 (two) times daily. (Patient taking differently: Take 40 mg by mouth daily.)   ondansetron (ZOFRAN) 4 MG tablet TAKE 1 TABLET BY MOUTH EVERY 8 HOURS AS NEEDED FOR NAUSEA AND VOMITING   ONETOUCH ULTRA test strip CHECK BLOOD SUGAR 1-2 TIMES DAILY   oxymetazoline (AFRIN) 0.05 % nasal spray Place 1 spray into both nostrils 2 (  two) times daily as needed for congestion.   rosuvastatin (CRESTOR) 10 MG tablet TAKE 1 TABLET BY MOUTH EVERY DAY   sitaGLIPtin (JANUVIA) 100 MG tablet Take 100 mg by mouth daily.   No facility-administered encounter medications on file as of 11/28/2022.    Allergies (verified) Reglan [metoclopramide] and Estrogens   History: Past Medical History:  Diagnosis Date   Anti-cardiolipin antibody syndrome 06/25/2011   Arthritis    Bronchitis    history of   COVID-19    Diabetes mellitus    DVT (deep venous thrombosis) 06/25/2011   Fatty liver disease, nonalcoholic    determinde by  Ultrasound 11/11   GERD (gastroesophageal reflux disease)    H/O vitamin D deficiency    Hiatal hernia    Hyperlipidemia    Hypertension    Hyponatremia    with diuretic use   Osteopenia    PONV (postoperative nausea and vomiting)    S/P TAVR (transcatheter aortic valve replacement) 08/28/2022   s/p TAVR with a 23 mm Edwards via the TF approach by Dr. Excell Seltzer & Dr. Delia Chimes   Vertigo    Past Surgical History:  Procedure Laterality Date   ABDOMINAL HYSTERECTOMY     ACHILLES TENDON SURGERY     bilateral    CARPAL TUNNEL RELEASE     bilateral    CHOLECYSTECTOMY  1969   open   CRANIOTOMY  07/09/2011   Procedure: CRANIOTOMY HEMATOMA EVACUATION SUBDURAL;  Surgeon: Kathaleen Maser Pool;  Location: [facility];  Service: Neurosurgery;  Laterality: Right;  Excision of Skull Lesion-Right Right Parietal Crani w/ Resection of Skull Tumor (1 1/2 hours - to follow)   DILATION AND CURETTAGE OF UTERUS     INTRAOPERATIVE TRANSTHORACIC ECHOCARDIOGRAM N/A 08/28/2022   Procedure: INTRAOPERATIVE TRANSTHORACIC ECHOCARDIOGRAM;  Surgeon: Tonny Bollman, MD;  Location: [facility] OR;  Service: Open Heart Surgery;  Laterality: N/A;   JOINT REPLACEMENT  2004   left knee replacement    KNEE SURGERY     total of 6 surgeries on both knees    REPAIR PERONEAL TENDONS ANKLE     RIGHT/LEFT HEART CATH AND CORONARY ANGIOGRAPHY N/A 07/19/2022   Procedure: RIGHT/LEFT HEART CATH AND CORONARY ANGIOGRAPHY;  Surgeon: Kathleene Hazel, MD;  Location: [facility];  Service: Cardiovascular;  Laterality: N/A;   TRANSCATHETER AORTIC VALVE REPLACEMENT, TRANSFEMORAL N/A 08/28/2022   Procedure: Transcatheter Aortic Valve Replacement, Transfemoral Approach using Edwards 23 MM SAPIEN 3 Ultra;  Surgeon: Tonny Bollman, MD;  Location: [facility] OR;  Service: Open Heart Surgery;  Laterality: N/A;  Transfemoral   TUBAL LIGATION     TUMOR REMOVAL     on pelvic bone    Family History  Problem Relation Age of Onset   Kidney disease Mother     Osteoporosis Mother    Heart disease Father    Cancer Father        lymphoma   Osteoporosis Father    Healthy Brother    Cancer Maternal Grandmother        lymphoma   Breast cancer Maternal Grandmother    Cancer Paternal Grandmother        breast   Breast cancer Paternal Aunt    Hodgkin's lymphoma Grandchild    Social History   Socioeconomic History   Marital status: Married    Spouse name: Not on file   Number of children: Not on file   Years of education: Not on file   Highest education level: Not on file  Occupational  History   Not on file  Tobacco Use   Smoking status: Never   Smokeless tobacco: Never  Vaping Use   Vaping Use: Never used  Substance and Sexual Activity   Alcohol use: No   Drug use: No   Sexual activity: Not Currently  Other Topics Concern   Not on file  Social History Narrative   Not on file   Social Determinants of Health   Financial Resource Strain: Low Risk  (11/28/2022)   Overall Financial Resource Strain (CARDIA)    Difficulty of Paying Living Expenses: Not hard at all  Food Insecurity: No Food Insecurity (11/28/2022)   Hunger Vital Sign    Worried About Running Out of Food in the Last Year: Never true    Ran Out of Food in the Last Year: Never true  Transportation Needs: No Transportation Needs (11/28/2022)   PRAPARE - Administrator, Civil Service (Medical): No    Lack of Transportation (Non-Medical): No  Physical Activity: Inactive (11/28/2022)   Exercise Vital Sign    Days of Exercise per Week: 0 days    Minutes of Exercise per Session: 0 min  Stress: No Stress Concern Present (11/28/2022)   [employer] of Occupational Health - Occupational Stress Questionnaire    Feeling of Stress : Not at all  Social Connections: Moderately Integrated (11/28/2022)   Social Connection and Isolation Panel [NHANES]    Frequency of Communication with Friends and Family: Twice a week    Frequency of Social Gatherings with Friends and  Family: Three times a week    Attends Religious Services: More than 4 times per year    Active Member of Clubs or Organizations: No    Attends Banker Meetings: Never    Marital Status: Married    Tobacco Counseling Counseling given: Not Answered   Clinical Intake:  Pre-visit preparation completed: Yes  Pain : No/denies pain     Diabetes: Yes CBG done?: No Did pt. bring in CBG monitor from home?: No  How often do you need to have someone help you when you read instructions, pamphlets, or other written materials from your doctor or pharmacy?: 1 - Never  Diabetic?  Yes .Nutrition Risk Assessment:  Has the patient had any N/V/D within the last 2 months?  No  Does the patient have any non-healing wounds?  No  Has the patient had any unintentional weight loss or weight gain?  No   Diabetes:  Is the patient diabetic?  Yes  If diabetic, was a CBG obtained today?  No  Did the patient bring in their glucometer from home?  No  How often do you monitor your CBG's? 1 x a day.   Financial Strains and Diabetes Management:  Are you having any financial strains with the device, your supplies or your medication? No .  Does the patient want to be seen by Chronic Care Management for management of their diabetes?  No  Would the patient like to be referred to a Nutritionist or for Diabetic Management?  No   Diabetic Exams:  Diabetic Eye Exam:  up to date Pt has been advised about the importance in completing this exam. A referral has been placed today. Message sent to referral coordinator for scheduling purposes. Advised pt to expect a call from office referred to regarding appt.  Diabetic Foot Exam: Completed . Pt has been advised about the importance in completing this exam.    Interpreter Needed?: No  Information entered  by :Remi Haggard LPN   Activities of Daily Living    11/28/2022    9:23 AM 08/28/2022    5:50 AM  In your present state of health, do you have  any difficulty performing the following activities:  Hearing? 0   Vision? 0   Difficulty concentrating or making decisions? 0   Walking or climbing stairs? 0   Dressing or bathing? 0   Doing errands, shopping? 0 0  Preparing Food and eating ? N   Using the Toilet? N   In the past six months, have you accidently leaked urine? N   Do you have problems with loss of bowel control? N   Managing your Medications? N   Managing your Finances? N   Housekeeping or managing your Housekeeping? N     Patient Care Team: Sheliah Hatch, MD as PCP - General (Family Medicine) Jodelle Red, MD as PCP - Cardiology (Cardiology) Julio Sicks, MD as Consulting Physician (Neurosurgery) Carman Ching, MD (Inactive) as Consulting Physician (Gastroenterology) Charlsie Merles Kirstie Peri, DPM as Consulting Physician (Podiatry) Dahlia Byes, [facility] (Inactive) as Pharmacist (Pharmacist)  Indicate any recent Medical Services you may have received from other than [facility] providers in the past year (date may be approximate).     Assessment:   This is a routine wellness examination for [place].  Hearing/Vision screen Hearing Screening - Comments:: No trouble hearing Vision Screening - Comments:: Up to date [facility]  Dietary issues and exercise activities discussed: Current Exercise Habits: The patient does not participate in regular exercise at present   Goals Addressed             This Visit's Progress    Patient Stated       Increase exercise        Depression Screen    11/28/2022    9:25 AM 09/14/2022    2:02 PM 07/31/2022    9:25 AM 06/06/2022    8:54 AM 04/30/2022    9:27 AM 01/01/2022    9:54 AM 09/25/2021    9:56 AM  PHQ 2/9 Scores  PHQ - 2 Score 0 0 1 0 0 0 0  PHQ- 9 Score 2 1 5  0 4 3 4     Fall Risk    11/28/2022    9:22 AM 09/14/2022    2:01 PM 07/31/2022    9:26 AM 06/06/2022    8:52 AM 04/30/2022    9:27 AM  Fall Risk   Falls in the past year? 0 0 0 0 0  Number falls in past yr:  0 0  0   Injury with Fall? 0 0  0   Risk for fall due to :  No Fall Risks No Fall Risks No Fall Risks No Fall Risks  Follow up Falls evaluation completed;Education provided;Falls prevention discussed Falls evaluation completed Falls evaluation completed Falls prevention discussed Falls evaluation completed    FALL RISK PREVENTION PERTAINING TO THE HOME:  Any stairs in or around the home? No  If so, are there any without handrails? No  Home free of loose throw rugs in walkways, pet beds, electrical cords, etc? Yes  Adequate lighting in your home to reduce risk of falls? Yes   ASSISTIVE DEVICES UTILIZED TO PREVENT FALLS:  Life alert? No  Use of a cane, walker or w/c? No  Grab bars in the bathroom? Yes  Shower chair or bench in shower? Yes  Elevated toilet seat or a handicapped toilet? Yes   TIMED UP AND  GO:  Was the test performed? No .    Cognitive Function:    10/24/2017    2:39 PM  MMSE - Mini Mental State Exam  Orientation to time 5  Orientation to Place 5  Registration 3  Attention/ Calculation 5  Recall 2  Language- name 2 objects 2  Language- repeat 1  Language- follow 3 step command 3  Language- read & follow direction 1  Write a sentence 1  Copy design 1  Total score 29        11/28/2022    9:23 AM 06/06/2022    8:55 AM 05/30/2020   11:31 AM  6CIT Screen  What Year? 0 points 0 points 0 points  What month? 0 points 0 points 0 points  What time? 0 points 0 points 0 points  Count back from 20 0 points 0 points 0 points  Months in reverse 0 points 0 points 2 points  Repeat phrase 0 points 0 points 0 points  Total Score 0 points 0 points 2 points    Immunizations Immunization History  Administered Date(s) Administered   Fluad Quad(high Dose 65+) 05/28/2019, 06/02/2020, 05/23/2021   Influenza Split 05/30/2009, 05/09/2010, 05/29/2013   Influenza, High Dose Seasonal PF 07/16/2014, 06/14/2015, 04/28/2018, 04/30/2022   Influenza,inj,Quad PF,6+ Mos  05/09/2011, 06/14/2015, 05/14/2016, 04/08/2017   Pneumococcal Conjugate-13 10/23/2013   Pneumococcal Polysaccharide-23 11/04/2014   Tdap 02/27/2010, 03/13/2011    TDAP status: Due, Education has been provided regarding the importance of this vaccine. Advised may receive this vaccine at local pharmacy or Health Dept. Aware to provide a copy of the vaccination record if obtained from local pharmacy or Health Dept. Verbalized acceptance and understanding.  Flu Vaccine status: Up to date  Pneumococcal vaccine status: Up to date  Covid-19 vaccine status: Declined, Education has been provided regarding the importance of this vaccine but patient still declined. Advised may receive this vaccine at local pharmacy or Health Dept.or vaccine clinic. Aware to provide a copy of the vaccination record if obtained from local pharmacy or Health Dept. Verbalized acceptance and understanding.  Qualifies for Shingles Vaccine? No   Zostavax completed Yes   Shingrix Completed?: Yes  Screening Tests Health Maintenance  Topic Date Due   Diabetic kidney evaluation - Urine ACR  01/19/2023   HEMOGLOBIN A1C  01/30/2023   INFLUENZA VACCINE  03/14/2023   FOOT EXAM  05/01/2023   OPHTHALMOLOGY EXAM  06/22/2023   Diabetic kidney evaluation - eGFR measurement  08/30/2023   MAMMOGRAM  10/02/2023   DEXA SCAN  11/22/2023   Medicare Annual Wellness (AWV)  11/28/2023   COLONOSCOPY (Pts 45-72yrs Insurance coverage will need to be confirmed)  08/23/2029   Pneumonia Vaccine 58+ Years old  Completed   Hepatitis C Screening  Completed   HPV VACCINES  Aged Out   DTaP/Tdap/Td  Discontinued   COVID-19 Vaccine  Discontinued   Zoster Vaccines- Shingrix  Discontinued    Health Maintenance  There are no preventive care reminders to display for this patient.  Colorectal cancer screening: No longer required.   Mammogram status: Completed 2023. Repeat every year  Bone Density status: Completed  . Results reflect: Bone  density results: OSTEOPENIA. Repeat every 2 years.  Lung Cancer Screening: (Low Dose CT Chest recommended if Age 3-80 years, 30 pack-year currently smoking OR have quit w/in 15years.) does not qualify.   Lung Cancer Screening Referral:   Additional Screening:  Hepatitis C Screening: does not qualify; Completed 2021  Vision Screening: Recommended annual ophthalmology  exams for early detection of glaucoma and other disorders of the eye. Is the patient up to date with their annual eye exam?  Yes  Who is the provider or what is the name of the office in which the patient attends annual eye exams? [facility] If pt is not established with a provider, would they like to be referred to a provider to establish care? No .   Dental Screening: Recommended annual dental exams for proper oral hygiene  Community Resource Referral / Chronic Care Management: CRR required this visit?  No   CCM required this visit?  No      Plan:     I have personally reviewed and noted the following in the patient's chart:   Medical and social history Use of alcohol, tobacco or illicit drugs  Current medications and supplements including opioid prescriptions. Patient is not currently taking opioid prescriptions. Functional ability and status Nutritional status Physical activity Advanced directives List of other physicians Hospitalizations, surgeries, and ER visits in previous 12 months Vitals Screenings to include cognitive, depression, and falls Referrals and appointments  In addition, I have reviewed and discussed with patient certain preventive protocols, quality metrics, and best practice recommendations. A written personalized care plan for preventive services as well as general preventive health recommendations were provided to patient.     Remi Haggard, LPN   1/61/0960   Nurse Notes:

## 2022-12-04 ENCOUNTER — Ambulatory Visit: Payer: PPO | Admitting: Family Medicine

## 2022-12-12 ENCOUNTER — Ambulatory Visit: Payer: PPO | Admitting: Family Medicine

## 2022-12-13 DIAGNOSIS — H524 Presbyopia: Secondary | ICD-10-CM | POA: Diagnosis not present

## 2022-12-13 DIAGNOSIS — H5203 Hypermetropia, bilateral: Secondary | ICD-10-CM | POA: Diagnosis not present

## 2022-12-13 DIAGNOSIS — H3561 Retinal hemorrhage, right eye: Secondary | ICD-10-CM | POA: Diagnosis not present

## 2022-12-13 DIAGNOSIS — H52223 Regular astigmatism, bilateral: Secondary | ICD-10-CM | POA: Diagnosis not present

## 2022-12-14 ENCOUNTER — Ambulatory Visit: Payer: PPO | Admitting: Family Medicine

## 2022-12-17 ENCOUNTER — Ambulatory Visit (INDEPENDENT_AMBULATORY_CARE_PROVIDER_SITE_OTHER): Payer: PPO | Admitting: Family Medicine

## 2022-12-17 ENCOUNTER — Encounter: Payer: Self-pay | Admitting: Family Medicine

## 2022-12-17 ENCOUNTER — Other Ambulatory Visit: Payer: Self-pay | Admitting: Family Medicine

## 2022-12-17 VITALS — BP 130/76 | HR 76 | Temp 97.9°F | Resp 18 | Ht 65.0 in | Wt 183.5 lb

## 2022-12-17 DIAGNOSIS — E1140 Type 2 diabetes mellitus with diabetic neuropathy, unspecified: Secondary | ICD-10-CM

## 2022-12-17 DIAGNOSIS — I10 Essential (primary) hypertension: Secondary | ICD-10-CM

## 2022-12-17 DIAGNOSIS — Z0000 Encounter for general adult medical examination without abnormal findings: Secondary | ICD-10-CM

## 2022-12-17 LAB — CBC WITH DIFFERENTIAL/PLATELET
Basophils Absolute: 0.1 10*3/uL (ref 0.0–0.1)
Basophils Relative: 0.9 % (ref 0.0–3.0)
Eosinophils Absolute: 0.2 10*3/uL (ref 0.0–0.7)
Eosinophils Relative: 2 % (ref 0.0–5.0)
HCT: 34.4 % — ABNORMAL LOW (ref 36.0–46.0)
Hemoglobin: 11.4 g/dL — ABNORMAL LOW (ref 12.0–15.0)
Lymphocytes Relative: 31.2 % (ref 12.0–46.0)
Lymphs Abs: 2.7 10*3/uL (ref 0.7–4.0)
MCHC: 33.1 g/dL (ref 30.0–36.0)
MCV: 79.4 fl (ref 78.0–100.0)
Monocytes Absolute: 0.7 10*3/uL (ref 0.1–1.0)
Monocytes Relative: 8.2 % (ref 3.0–12.0)
Neutro Abs: 5.1 10*3/uL (ref 1.4–7.7)
Neutrophils Relative %: 57.7 % (ref 43.0–77.0)
Platelets: 197 10*3/uL (ref 150.0–400.0)
RBC: 4.33 Mil/uL (ref 3.87–5.11)
RDW: 14.9 % (ref 11.5–15.5)
WBC: 8.8 10*3/uL (ref 4.0–10.5)

## 2022-12-17 LAB — BASIC METABOLIC PANEL
BUN: 19 mg/dL (ref 6–23)
CO2: 24 mEq/L (ref 19–32)
Calcium: 9.8 mg/dL (ref 8.4–10.5)
Chloride: 101 mEq/L (ref 96–112)
Creatinine, Ser: 0.85 mg/dL (ref 0.40–1.20)
GFR: 66.96 mL/min (ref >60.00)
Glucose, Bld: 125 mg/dL — ABNORMAL HIGH (ref 70–99)
Potassium: 4.8 mEq/L (ref 3.5–5.1)
Sodium: 136 mEq/L (ref 135–145)

## 2022-12-17 LAB — LIPID PANEL
Cholesterol: 110 mg/dL (ref 0–200)
HDL: 45.4 mg/dL (ref >39.00)
LDL Cholesterol: 38 mg/dL (ref 0–99)
NonHDL: 64.77
Total CHOL/HDL Ratio: 2
Triglycerides: 135 mg/dL (ref 0.0–149.0)
VLDL: 27 mg/dL (ref 0.0–40.0)

## 2022-12-17 LAB — MICROALBUMIN / CREATININE URINE RATIO
Creatinine,U: 90.7 mg/dL
Microalb Creat Ratio: 4.1 mg/g (ref 0.0–30.0)
Microalb, Ur: 3.7 mg/dL — ABNORMAL HIGH (ref 0.0–1.9)

## 2022-12-17 LAB — TSH: TSH: 1.37 u[IU]/mL (ref 0.35–5.50)

## 2022-12-17 LAB — HEPATIC FUNCTION PANEL
ALT: 40 U/L — ABNORMAL HIGH (ref 0–35)
AST: 36 U/L (ref 0–37)
Albumin: 4.3 g/dL (ref 3.5–5.2)
Alkaline Phosphatase: 51 U/L (ref 39–117)
Bilirubin, Direct: 0.1 mg/dL (ref 0.0–0.3)
Total Bilirubin: 0.5 mg/dL (ref 0.2–1.2)
Total Protein: 8 g/dL (ref 6.0–8.3)

## 2022-12-17 LAB — HEMOGLOBIN A1C: Hgb A1c MFr Bld: 7.5 % — ABNORMAL HIGH (ref 4.6–6.5)

## 2022-12-17 NOTE — Assessment & Plan Note (Signed)
Chronic problem.  Pt reports sugars have been running high since her heart surgery 6 months ago.  But at the same time, has had a few symptomatic lows.  This is likely due to dietary indiscretions as she admits to eating poorly.  Check labs.  Adjust meds prn

## 2022-12-17 NOTE — Progress Notes (Signed)
Subjective:    Patient ID: Karen Gonzales, female    DOB: 06/25/1946, 77 y.o.   MRN: [id number]  HPI CPE- UTD on eye exam, foot exam, mammo, DEXA, colonoscopy.  Due for microalbumin  Patient Care Team    Relationship Specialty Notifications Start End  Sheliah Hatch, MD PCP - General Family Medicine  11/07/15   Jodelle Red, MD PCP - Cardiology Cardiology  07/11/22   Julio Sicks, MD Consulting Physician Neurosurgery  07/02/11   Carman Ching, MD (Inactive) Consulting Physician Gastroenterology  11/07/15   Lenn Sink, DPM Consulting Physician Podiatry  11/07/15   Dahlia Byes, [facility] (Inactive) Pharmacist Pharmacist Admissions 11/23/19    Comment: PHONE NUMBER [phone]     Health Maintenance  Topic Date Due   Diabetic kidney evaluation - Urine ACR  01/19/2023   HEMOGLOBIN A1C  01/30/2023   INFLUENZA VACCINE  03/14/2023   FOOT EXAM  05/01/2023   OPHTHALMOLOGY EXAM  06/22/2023   Diabetic kidney evaluation - eGFR measurement  08/30/2023   MAMMOGRAM  10/02/2023   DEXA SCAN  11/22/2023   Medicare Annual Wellness (AWV)  11/28/2023   COLONOSCOPY (Pts 45-14yrs Insurance coverage will need to be confirmed)  08/23/2029   Pneumonia Vaccine 35+ Years old  Completed   Hepatitis C Screening  Completed   HPV VACCINES  Aged Out   DTaP/Tdap/Td  Discontinued   COVID-19 Vaccine  Discontinued   Zoster Vaccines- Shingrix  Discontinued      Review of Systems Patient reports no vision/ hearing changes, adenopathy,fever, weight change,  persistant/recurrent hoarseness , swallowing issues, chest pain, palpitations, edema, persistant/recurrent cough, hemoptysis, gastrointestinal bleeding (melena, rectal bleeding), abdominal pain, significant heartburn, bowel changes, Gyn symptoms (abnormal  bleeding, pain),  syncope, focal weakness, memory loss, numbness & tingling, skin/hair/nail changes, abnormal bruising or bleeding, anxiety, or depression.   + dizziness- reports she will have  episodes where things start spinning.  Has meclizine to take as needed. + SOB on exertion + nocturia    Objective:   Physical Exam General Appearance:    Alert, cooperative, no distress, appears stated age  Head:    Normocephalic, without obvious abnormality, atraumatic  Eyes:    PERRL, conjunctiva/corneas clear, EOM's intact both eyes  Ears:    Normal TM's and external ear canals, both ears  Nose:   Nares normal, septum midline, mucosa normal, no drainage    or sinus tenderness  Throat:   Lips, mucosa, and tongue normal; teeth and gums normal  Neck:   Supple, symmetrical, trachea midline, no adenopathy;    Thyroid: no enlargement/tenderness/nodules  Back:     Symmetric, no curvature, ROM normal, no CVA tenderness  Lungs:     Clear to auscultation bilaterally, respirations unlabored  Chest Wall:    No tenderness or deformity   Heart:    Regular rate and rhythm, S1 and S2 normal, no murmur, rub   or gallop  Breast Exam:    Deferred to mammo  Abdomen:     Soft, non-tender, bowel sounds active all four quadrants,    no masses, no organomegaly  Genitalia:    Deferred  Rectal:    Extremities:   Extremities normal, atraumatic, no cyanosis or edema  Pulses:   2+ and symmetric all extremities  Skin:   Skin color, texture, turgor normal, no rashes or lesions  Lymph nodes:   Cervical, supraclavicular, and axillary nodes normal  Neurologic:   CNII-XII intact, normal strength, sensation and reflexes    throughout  Assessment & Plan:

## 2022-12-17 NOTE — Patient Instructions (Signed)
Follow up in 3 months to recheck sugar We'll notify you of your lab results and make any changes if needed Try and eat a low carb, low sugar diet Keep track of your dizziness and if the episodes are occurring more frequently- let me know and we can refer to neuro Call with any questions or concerns Stay Safe!  Stay Healthy! Have a great summer!!!

## 2022-12-17 NOTE — Assessment & Plan Note (Signed)
Pt's PE WNL w/ exception of BMI.  UTD on eye exam, foot exam, mammo, DEXA, colonoscopy.  Check labs.  Anticipatory guidance provided.

## 2022-12-18 ENCOUNTER — Other Ambulatory Visit: Payer: Self-pay

## 2022-12-18 ENCOUNTER — Telehealth: Payer: Self-pay

## 2022-12-18 MED ORDER — EMPAGLIFLOZIN 25 MG PO TABS: 25.0000 mg | ORAL_TABLET | Freq: Every day | ORAL | 1 refills | Status: DC

## 2022-12-18 NOTE — Telephone Encounter (Signed)
-----   Message from Sheliah Hatch, MD sent at 12/18/2022  7:15 AM EDT ----- Labs are stable and look good.  No changes at this time

## 2022-12-18 NOTE — Telephone Encounter (Signed)
Pt is aware of lab results.

## 2022-12-19 ENCOUNTER — Other Ambulatory Visit: Payer: Self-pay

## 2022-12-19 ENCOUNTER — Telehealth: Payer: Self-pay

## 2022-12-19 NOTE — Telephone Encounter (Signed)
Received request to complete a PA for Ondansetron but in patient chart it is marked as not taking currently.  Called patient to verify if she does need this medication at this time she reported she does not so I confirmed with her it was okay to stop PA and one could be completed when the medication was necessary patient was agreeable to plan and the PA has been archived on cover my meds

## 2023-01-01 ENCOUNTER — Ambulatory Visit (INDEPENDENT_AMBULATORY_CARE_PROVIDER_SITE_OTHER): Payer: PPO | Admitting: Family Medicine

## 2023-01-01 ENCOUNTER — Ambulatory Visit: Payer: PPO | Admitting: Family Medicine

## 2023-01-01 ENCOUNTER — Encounter: Payer: Self-pay | Admitting: Family Medicine

## 2023-01-01 VITALS — BP 122/60 | HR 99 | Temp 98.0°F | Resp 17 | Ht 65.0 in | Wt 186.1 lb

## 2023-01-01 DIAGNOSIS — R300 Dysuria: Secondary | ICD-10-CM | POA: Diagnosis not present

## 2023-01-01 LAB — POCT URINALYSIS DIPSTICK
Blood, UA: POSITIVE
Glucose, UA: NEGATIVE
Spec Grav, UA: 1.01 (ref 1.010–1.025)
Urobilinogen, UA: 0.2 E.U./dL
pH, UA: 5.5 (ref 5.0–8.0)

## 2023-01-01 MED ORDER — CEPHALEXIN 500 MG PO CAPS: 500.0000 mg | ORAL_CAPSULE | Freq: Two times a day (BID) | ORAL | 0 refills | Status: AC

## 2023-01-01 NOTE — Patient Instructions (Signed)
Follow up as needed Start the Cephalexin twice daily Drink LOTS of water Call with any questions or concerns Hang in there!!!

## 2023-01-01 NOTE — Progress Notes (Signed)
   Subjective:    Patient ID: Karen Gonzales, female    DOB: [birth year], 77 y.o.   MRN: [id number]  HPI Dysuria- sxs started Sunday afternoon w/ burning w/ urination.  Has a pinching pain after urination.  + frequency but only a small amount of urine.  Using OTC AZO for the last 2 days.  No fever.  No hematuria   Review of Systems For ROS see HPI     Objective:   Physical Exam Vitals reviewed.  Constitutional:      General: She is not in acute distress.    Appearance: Normal appearance. She is well-developed. She is not ill-appearing.  Abdominal:     General: There is no distension.     Palpations: Abdomen is soft.     Tenderness: There is abdominal tenderness (mild suprapubic TTP). There is no right CVA tenderness or left CVA tenderness.  Skin:    General: Skin is warm and dry.  Neurological:     General: No focal deficit present.     Mental Status: She is alert and oriented to person, place, and time.  Psychiatric:        Mood and Affect: Mood normal.        Behavior: Behavior normal.        Thought Content: Thought content normal.           Assessment & Plan:  Dysuria- new.  Pt's sxs and UA are consistent w/ infxn.  Unfortunately not enough urine for culture.  Will start Keflex.  If no improvement, will need to return to provide another sample for culture.  Pt expressed understanding and is in agreement w/ plan.

## 2023-01-09 ENCOUNTER — Encounter (HOSPITAL_BASED_OUTPATIENT_CLINIC_OR_DEPARTMENT_OTHER): Payer: Self-pay | Admitting: Cardiology

## 2023-01-09 ENCOUNTER — Ambulatory Visit (HOSPITAL_BASED_OUTPATIENT_CLINIC_OR_DEPARTMENT_OTHER): Payer: PPO | Admitting: Cardiology

## 2023-01-09 VITALS — BP 137/76 | HR 78 | Ht 65.0 in | Wt 187.8 lb

## 2023-01-09 DIAGNOSIS — I350 Nonrheumatic aortic (valve) stenosis: Secondary | ICD-10-CM

## 2023-01-09 DIAGNOSIS — R42 Dizziness and giddiness: Secondary | ICD-10-CM

## 2023-01-09 DIAGNOSIS — E782 Mixed hyperlipidemia: Secondary | ICD-10-CM

## 2023-01-09 DIAGNOSIS — Z952 Presence of prosthetic heart valve: Secondary | ICD-10-CM | POA: Diagnosis not present

## 2023-01-09 DIAGNOSIS — I10 Essential (primary) hypertension: Secondary | ICD-10-CM

## 2023-01-09 DIAGNOSIS — Z7984 Long term (current) use of oral hypoglycemic drugs: Secondary | ICD-10-CM

## 2023-01-09 DIAGNOSIS — E1140 Type 2 diabetes mellitus with diabetic neuropathy, unspecified: Secondary | ICD-10-CM

## 2023-01-09 NOTE — Progress Notes (Signed)
Cardiology Office Note:    Date:  01/09/2023   ID:  Karen Gonzales, DOB February 01, 1946, MRN [id number]  PCP:  Sheliah Hatch, MD  Cardiologist:  Jodelle Red, MD  Referring MD: Sheliah Hatch, MD   CC: follow up   History of Present Illness:    Karen Gonzales is a 77 y.o. female with a hx of AS s/p TAVR 08/2022, hypertension, hyperlipidemia, diabetes, DVT, GERD, hyponatremia (with diuretic use), fatty liver disease (nonalcoholic), anti-cardiolipin antibody syndrome, arthritis, and COVID-19, who is seen for follow up today. I initially met her 07/17/21 as a new consult at the request of Sheliah Hatch, MD for the evaluation and management of aortic valve stenosis.  Today: Has had intermittent vertigo since her TAVR procedure, getting better slowly. Has a history of prior vertigo. Helped by meclizine. Feels somewhat better since the TAVR, though still fatigued. Gradually improving. No chest pain or shortness of breath. No syncope.  Past Medical History:  Diagnosis Date   Anti-cardiolipin antibody syndrome (HCC) 06/25/2011   Arthritis    Bronchitis    history of   COVID-19    Diabetes mellitus    DVT (deep venous thrombosis) (HCC) 06/25/2011   Fatty liver disease, nonalcoholic    determinde by Ultrasound 11/11   GERD (gastroesophageal reflux disease)    H/O vitamin D deficiency    Hiatal hernia    Hyperlipidemia    Hypertension    Hyponatremia    with diuretic use   Osteopenia    PONV (postoperative nausea and vomiting)    S/P TAVR (transcatheter aortic valve replacement) 08/28/2022   s/p TAVR with a 23 mm Edwards via the TF approach by Dr. Excell Seltzer & Dr. Delia Chimes   Vertigo     Past Surgical History:  Procedure Laterality Date   ABDOMINAL HYSTERECTOMY     ACHILLES TENDON SURGERY     bilateral    CARPAL TUNNEL RELEASE     bilateral    CHOLECYSTECTOMY  1969   open   CRANIOTOMY  07/09/2011   Procedure: CRANIOTOMY HEMATOMA EVACUATION SUBDURAL;  Surgeon:  Kathaleen Maser Pool;  Location: [facility];  Service: Neurosurgery;  Laterality: Right;  Excision of Skull Lesion-Right Right Parietal Crani w/ Resection of Skull Tumor (1 1/2 hours - to follow)   DILATION AND CURETTAGE OF UTERUS     INTRAOPERATIVE TRANSTHORACIC ECHOCARDIOGRAM N/A 08/28/2022   Procedure: INTRAOPERATIVE TRANSTHORACIC ECHOCARDIOGRAM;  Surgeon: Tonny Bollman, MD;  Location: [facility] OR;  Service: Open Heart Surgery;  Laterality: N/A;   JOINT REPLACEMENT  2004   left knee replacement    KNEE SURGERY     total of 6 surgeries on both knees    REPAIR PERONEAL TENDONS ANKLE     RIGHT/LEFT HEART CATH AND CORONARY ANGIOGRAPHY N/A 07/19/2022   Procedure: RIGHT/LEFT HEART CATH AND CORONARY ANGIOGRAPHY;  Surgeon: Kathleene Hazel, MD;  Location: [facility];  Service: Cardiovascular;  Laterality: N/A;   TRANSCATHETER AORTIC VALVE REPLACEMENT, TRANSFEMORAL N/A 08/28/2022   Procedure: Transcatheter Aortic Valve Replacement, Transfemoral Approach using Edwards 23 MM SAPIEN 3 Ultra;  Surgeon: Tonny Bollman, MD;  Location: [facility];  Service: Open Heart Surgery;  Laterality: N/A;  Transfemoral   TUBAL LIGATION     TUMOR REMOVAL     on pelvic bone     Current Medications: Current Outpatient Medications on File Prior to Visit  Medication Sig   amLODipine (NORVASC) 10 MG tablet TAKE 1 TABLET BY MOUTH EVERY DAY  amoxicillin (AMOXIL) 500 MG tablet Take 4 tablets (2,000 mg total) by mouth as directed. 1 hour prior to dental work including cleanings   aspirin EC 81 MG tablet Take 1 tablet (81 mg total) by mouth daily. Swallow whole.   Blood Glucose Monitoring Suppl (ONE TOUCH ULTRA 2) w/Device KIT Pt uses glucometer to check sugars twice daily. Dx E11.9   empagliflozin (JARDIANCE) 25 MG TABS tablet Take 1 tablet (25 mg total) by mouth daily.   gabapentin (NEURONTIN) 300 MG capsule TAKE 2 CAPSULES BY MOUTH 3 TIMES A DAY   glipiZIDE (GLUCOTROL XL) 10 MG 24 hr tablet TAKE 1 TABLET BY MOUTH EVERY  DAY   Homeopathic Products (RESTFUL LEGS SL) Place 2 tablets under the tongue daily as needed (restless legs).   Lancets (ONETOUCH DELICA PLUS LANCET33G) MISC USE WHEN CHECKING BLOOD SUGAR AS DIRECTED   losartan (COZAAR) 50 MG tablet TAKE 2 TABLETS BY MOUTH EVERY DAY   meclizine (ANTIVERT) 25 MG tablet Take 1 tablet (25 mg total) by mouth 3 (three) times daily as needed for dizziness.   melatonin 5 MG TABS Take 5 mg by mouth at bedtime as needed (sleep).   meloxicam (MOBIC) 15 MG tablet TAKE 1 TABLET BY MOUTH EVERY DAY WITH A MEAL   Menthol-Methyl Salicylate (MUSCLE RUB) 10-15 % CREA Apply 1 Application topically as needed for muscle pain.   metFORMIN (GLUCOPHAGE) 500 MG tablet TAKE 2 TABLETS BY MOUTH 2 TIMES DAILY WITH A MEAL.   omeprazole (PRILOSEC) 40 MG capsule Take 1 capsule (40 mg total) by mouth 2 (two) times daily. (Patient taking differently: Take 40 mg by mouth daily.)   ONETOUCH ULTRA test strip CHECK BLOOD SUGAR 1-2 TIMES DAILY   oxymetazoline (AFRIN) 0.05 % nasal spray Place 1 spray into both nostrils 2 (two) times daily as needed for congestion.   rosuvastatin (CRESTOR) 10 MG tablet TAKE 1 TABLET BY MOUTH EVERY DAY   sitaGLIPtin (JANUVIA) 100 MG tablet Take 100 mg by mouth daily.   No current facility-administered medications on file prior to visit.     Allergies:   Reglan [metoclopramide] and Estrogens   Social History   Tobacco Use   Smoking status: Never   Smokeless tobacco: Never  Vaping Use   Vaping Use: Never used  Substance Use Topics   Alcohol use: No   Drug use: No    Family History: family history includes Breast cancer in her maternal grandmother and paternal aunt; Cancer in her father, maternal grandmother, and paternal grandmother; Healthy in her brother; Heart disease in her father; Hodgkin's lymphoma in her grandchild; Kidney disease in her mother; Osteoporosis in her father and mother. "Whole family" has diabetes. Father died of heart failure at 77 yo.  Mother died at 22 yo due to kidney failure. Two brothers, one died from diabetes, and one living with diabetes and other health issues.  ROS:   Please see the history of present illness.   Additional pertinent ROS otherwise unremarkable.  EKGs/Labs/Other Studies Reviewed:    The following studies were reviewed today: Echo 09/28/22  1. Left ventricular ejection fraction, by estimation, is 65 to 70%. The  left ventricle has hyperdynamic function. The left ventricle has no  regional wall motion abnormalities. Left ventricular diastolic parameters  are consistent with Grade I diastolic  dysfunction (impaired relaxation).   2. Right ventricular systolic function is normal. The right ventricular  size is normal. There is normal pulmonary artery systolic pressure. The  estimated right ventricular systolic pressure  is 19.0 mmHg.   3. The mitral valve is normal in structure. Trivial mitral valve  regurgitation. No evidence of mitral stenosis.   4. Bioprosthetic aortic valve s/p TAVR with 23 mm Edwards Sapien THV.  Mean gradient 12 mmHg with EOA 1.95 cm^2. No significant peri-valvular  leakage noted.   5. Left atrial size was mildly dilated.   6. The inferior vena cava is normal in size with greater than 50%  respiratory variability, suggesting right atrial pressure of 3 mmHg.   ECHO 06/29/2022:  1. Left ventricular ejection fraction, by estimation, is 65 to 70%. The  left ventricle has normal function. The left ventricle has no regional  wall motion abnormalities. There is mild concentric left ventricular  hypertrophy. Left ventricular diastolic  parameters are consistent with Grade I diastolic dysfunction (impaired  relaxation).   2. Right ventricular systolic function is normal. The right ventricular  size is normal. Tricuspid regurgitation signal is inadequate for assessing  PA pressure.   3. Left atrial size was moderately dilated.   4. The mitral valve is normal in structure. No  evidence of mitral valve  regurgitation.   5. The aortic valve is tricuspid. There is severe calcifcation of the  aortic valve. There is severe thickening of the aortic valve. Aortic valve  regurgitation is trivial. Severe aortic valve stenosis.   6. The inferior vena cava is normal in size with greater than 50%  respiratory variability, suggesting right atrial pressure of 3 mmHg.   Comparison(s): Prior images reviewed side by side. Aortic stenosis is now  severe. Although gradiients are similar to the previous study, this is in  the setting of a lower stroke volume. Dimensionless valve index is now  0.24.   Echo 12/15/21:  1. Left ventricular ejection fraction, by estimation, is 60 to 65%. Left  ventricular ejection fraction by 3D volume is 60 %. The left ventricle has  normal function. The left ventricle has no regional wall motion abnormalities. Left ventricular diastolic  parameters are consistent with Grade I diastolic dysfunction (impaired relaxation).   2. Normal RV free wall strain index -29.9%. Right ventricular systolic  function is normal. The right ventricular size is normal. Tricuspid  regurgitation signal is inadequate for assessing PA pressure.   3. Left atrial size was severely dilated.   4. The mitral valve is abnormal. Trivial mitral valve regurgitation.   5. The aortic valve is tricuspid. There is moderate calcification of the  aortic valve. Aortic valve regurgitation is mild. Moderate to severe  aortic valve stenosis. Aortic regurgitation PHT measures 363 msec. Aortic  valve area, by VTI measures 0.97 cm.  Aortic valve mean gradient measures 33.0 mmHg. Aortic valve Vmax measures 3.75 m/s. DI is 0.34.  Echo 06/06/2021:  1. Left ventricular ejection fraction, by estimation, is 55 to 60%. The  left ventricle has normal function. The left ventricle has no regional  wall motion abnormalities. There is mild asymmetric left ventricular  hypertrophy of the posterior segment.   Left ventricular diastolic parameters are consistent with Grade I  diastolic dysfunction (impaired relaxation).   2. Right ventricular systolic function is normal. The right ventricular  size is normal.   3. Left atrial size was moderately dilated.   4. The mitral valve is normal in structure. No evidence of mitral valve  regurgitation. No evidence of mitral stenosis.   5. The aortic valve is calcified. Aortic valve regurgitation is not  visualized. Moderate to severe aortic valve stenosis (Susipicion for  Paradoxical  Low flow low gradient aortic stenosis). Aortic valve area, by  VTI measures 0.67 cm. Aortic valve mean  gradient measures 25.7 mmHg. Aortic valve Vmax measures 3.35 m/s, Indexed  AVA 0.35 cmsq/msq, SVI 26, DI 0.26.   6. The inferior vena cava is normal in size with greater than 50%  respiratory variability, suggesting right atrial pressure of 3 mmHg.   Comparison(s): A prior study was performed on 11/04/2017. Compared to prior study, Aortic stenosis have worsen. In 2019 there was mild AS with mean gradient 14 mmHg peak gradient 26 mmHg.   Left LE Venous Reflux 10/09/2019: Summary:  Left:  - No evidence of deep vein thrombosis seen in the left lower extremity.  - Venous reflux is noted in the deep venous system.  - Venous reflux is noted in the great saphenous vein.  - There is a tortuous anterior saphenous vein branch observed with several  varicosities.  - There is a minimal amount of chronic thrombous observed in the proximal  small saphenous vein, no reflux noted.   EKG:  EKG is personally reviewed.   01/09/23: no ECG today 07/11/2022: NSR at 86 bpm 09/12/21 NSR at 88 bpm 07/17/2021: NSR at 79 bpm  Recent Labs: 08/29/2022: Magnesium 1.7 12/17/2022: ALT 40; BUN 19; Creatinine, Ser 0.85; Hemoglobin 11.4; Platelets 197.0; Potassium 4.8; Sodium 136; TSH 1.37   Recent Lipid Panel    Component Value Date/Time   CHOL 110 12/17/2022 1009   TRIG 135.0 12/17/2022 1009    HDL 45.40 12/17/2022 1009   CHOLHDL 2 12/17/2022 1009   VLDL 27.0 12/17/2022 1009   LDLCALC 38 12/17/2022 1009   LDLCALC 75 10/24/2017 1559   LDLDIRECT 104.0 04/08/2017 1125    Physical Exam:    VS:  BP 137/76 (BP Location: Right Arm, Patient Position: Sitting, Cuff Size: Large)   Pulse 78   Ht 5\' 5"  (1.651 m)   Wt 187 lb 12.8 oz (85.2 kg)   SpO2 96%   BMI 31.25 kg/m     Wt Readings from Last 3 Encounters:  01/09/23 187 lb 12.8 oz (85.2 kg)  01/01/23 186 lb 2 oz (84.4 kg)  12/17/22 183 lb 8 oz (83.2 kg)    GEN: Well nourished, well developed in no acute distress HEENT: Normal, moist mucous membranes NECK: No JVD CARDIAC: regular rhythm, normal S1 and S2, no rubs or gallops. No systolic murmur. VASCULAR: Radial and DP pulses 2+ bilaterally. No carotid bruits RESPIRATORY:  Clear to auscultation without rales, wheezing or rhonchi  ABDOMEN: Soft, non-tender, non-distended MUSCULOSKELETAL:  Ambulates independently SKIN: Warm and dry, no edema NEUROLOGIC:  Alert and oriented x 3. No focal neuro deficits noted. PSYCHIATRIC:  Normal affect    ASSESSMENT:    1. S/P TAVR (transcatheter aortic valve replacement)   2. Severe aortic stenosis   3. Dizziness   4. Essential hypertension   5. Mixed hyperlipidemia   6. Type 2 diabetes mellitus with diabetic neuropathy, without long-term current use of insulin (HCC)      PLAN:    Aortic stenosis -s/p TAVR 08/2022 -has dental prophylaxis -doing very well  Hypertension: -near goal today, always slightly higher in the office -continue amlodipine, losartan  Type II diabetes Mixed hyperlipidemia -on empagliflozin, glipizide, metformin, sitagliptin -continue rosuvastatin 10 mg, LDL 38 -continue aspirin 81 mg daily  Cardiac risk counseling and prevention recommendations: -recommend heart healthy/Mediterranean diet, with whole grains, fruits, vegetable, fish, lean meats, nuts, and olive oil. Limit salt. -recommend moderate  walking, 3-5 times/week for  30-50 minutes each session. Aim for at least 150 minutes.week. Goal should be pace of 3 miles/hours, or walking 1.5 miles in 30 minutes -recommend avoidance of tobacco products. Avoid excess alcohol. -ASCVD risk score: The ASCVD Risk score (Arnett DK, et al., 2019) failed to calculate for the following reasons:   The valid total cholesterol range is 130 to 320 mg/dL    Plan for follow up: 6 months or sooner as needed  Jodelle Red, MD, PhD, [facility]  [facility]    Medication Adjustments/Labs and Tests Ordered: Current medicines are reviewed at length with the patient today.  Concerns regarding medicines are outlined above.   No orders of the defined types were placed in this encounter.   No orders of the defined types were placed in this encounter.   Patient Instructions  Medication Instructions:  Your physician recommends that you continue on your current medications as directed. Please refer to the Current Medication list given to you today.  *If you need a refill on your cardiac medications before your next appointment, please call your pharmacy*  Lab Work: NONE  Testing/Procedures: NONE  Follow-Up: At [facility], you and your health needs are our priority.  As part of our continuing mission to provide you with exceptional heart care, we have created designated Provider Care Teams.  These Care Teams include your primary Cardiologist (physician) and Advanced Practice Providers (APPs -  Physician Assistants and Nurse Practitioners) who all work together to provide you with the care you need, when you need it.  We recommend signing up for the patient portal called "MyChart".  Sign up information is provided on this After Visit Summary.  MyChart is used to connect with patients for Virtual Visits (Telemedicine).  Patients are able to view lab/test results, encounter notes, upcoming appointments, etc.  Non-urgent messages  can be sent to your provider as well.   To learn more about what you can do with MyChart, go to [email or web address].    Your next appointment:   6 month(s)  The format for your next appointment:   In Person  Provider:   Jodelle Red, MD       Signed, Jodelle Red, MD PhD 01/09/2023   [facility]

## 2023-01-09 NOTE — Patient Instructions (Signed)
Medication Instructions:  Your physician recommends that you continue on your current medications as directed. Please refer to the Current Medication list given to you today.   *If you need a refill on your cardiac medications before your next appointment, please call your pharmacy*  Lab Work: NONE  Testing/Procedures: NONE   Follow-Up: At [facility], you and your health needs are our priority.  As part of our continuing mission to provide you with exceptional heart care, we have created designated Provider Care Teams.  These Care Teams include your primary Cardiologist (physician) and Advanced Practice Providers (APPs -  Physician Assistants and Nurse Practitioners) who all work together to provide you with the care you need, when you need it.  We recommend signing up for the patient portal called "MyChart".  Sign up information is provided on this After Visit Summary.  MyChart is used to connect with patients for Virtual Visits (Telemedicine).  Patients are able to view lab/test results, encounter notes, upcoming appointments, etc.  Non-urgent messages can be sent to your provider as well.   To learn more about what you can do with MyChart, go to [email or web address].    Your next appointment:   6 month(s)  The format for your next appointment:   In Person  Provider:   Bridgette Christopher, MD             

## 2023-01-18 ENCOUNTER — Other Ambulatory Visit: Payer: Self-pay | Admitting: Family Medicine

## 2023-01-18 NOTE — Telephone Encounter (Signed)
Patient is requesting a refill of the following medications: Requested Prescriptions   Pending Prescriptions Disp Refills   meloxicam (MOBIC) 15 MG tablet [Pharmacy Med Name: MELOXICAM 15 MG TABLET] 30 tablet 0    Sig: TAKE 1 TABLET BY MOUTH EVERY DAY WITH A MEAL    Date of patient request: 01/18/23 Last office visit: 12/17/22 Date of last refill: 12/17/22 Last refill amount:30

## 2023-01-25 ENCOUNTER — Encounter: Payer: Self-pay | Admitting: Family Medicine

## 2023-01-25 ENCOUNTER — Ambulatory Visit (INDEPENDENT_AMBULATORY_CARE_PROVIDER_SITE_OTHER): Payer: PPO | Admitting: Family Medicine

## 2023-01-25 VITALS — BP 126/84 | HR 80 | Temp 97.8°F | Resp 16 | Ht 65.0 in | Wt <= 1120 oz

## 2023-01-25 DIAGNOSIS — R1031 Right lower quadrant pain: Secondary | ICD-10-CM | POA: Diagnosis not present

## 2023-01-25 DIAGNOSIS — M545 Low back pain, unspecified: Secondary | ICD-10-CM | POA: Diagnosis not present

## 2023-01-25 LAB — POC URINALSYSI DIPSTICK (AUTOMATED)
Bilirubin, UA: NEGATIVE
Blood, UA: NEGATIVE
Glucose, UA: POSITIVE — AB
Ketones, UA: NEGATIVE
Leukocytes, UA: NEGATIVE
Nitrite, UA: NEGATIVE
Protein, UA: NEGATIVE
Spec Grav, UA: 1.01 (ref 1.010–1.025)
Urobilinogen, UA: 0.2 E.U./dL
pH, UA: 6 (ref 5.0–8.0)

## 2023-01-25 MED ORDER — TIZANIDINE HCL 4 MG PO TABS: 4.0000 mg | ORAL_TABLET | Freq: Three times a day (TID) | ORAL | 0 refills | Status: DC | PRN

## 2023-01-25 NOTE — Patient Instructions (Signed)
Follow up as needed or as scheduled We'll send your urine for culture and notify you of the results Start the Tizanidine (muscle relaxer) as needed Use a heating pad to help w/ pain Call with any questions or concerns Hang in there!

## 2023-01-25 NOTE — Progress Notes (Signed)
   Subjective:    Patient ID: [name], female    DOB: [birth year], 77 y.o.   MRN: [id number]  HPI Back pain- pt reports she is no longer having burning w/ urination.  Pt reports increased urinary frequency.  Denies urgency.  No blood in urine.  + urinary odor.  Pt continues to have central LBP that radiates to the R.  Seemed to worsen w/ UTI.  Completed a course of Keflex on 5/25.  Pain is worse when she tries to rise from a seated position.  No radiation of pain.  Minimal relief w/ OTC pain meds.  No N/V.   Review of Systems For ROS see HPI     Objective:   Physical Exam Vitals reviewed.  Constitutional:      General: She is not in acute distress.    Appearance: Normal appearance. She is not ill-appearing.  HENT:     Head: Normocephalic and atraumatic.  Pulmonary:     Effort: Pulmonary effort is normal.     Breath sounds: Normal breath sounds.  Abdominal:     General: There is no distension.     Palpations: Abdomen is soft.     Tenderness: There is no abdominal tenderness. There is no right CVA tenderness, left CVA tenderness or guarding.  Musculoskeletal:        General: No tenderness (no TTP over R lumbar paraspinal muscles or over lumbar spine).  Skin:    General: Skin is warm and dry.  Neurological:     General: No focal deficit present.     Mental Status: She is alert and oriented to person, place, and time.  Psychiatric:        Mood and Affect: Mood normal.        Behavior: Behavior normal.        Thought Content: Thought content normal.           Assessment & Plan:   R sided low back pain- new.  Pt reports sxs became much more notable w/ onset of UTI last month.  Since we were not able to obtain culture last time, pt is fearful that infxn has not cleared despite completing course of Keflex.  Will culture urine to ensure infection has resolved.  Start Tizanidine as needed for pain/spasm relief.  If no improvement, will refer to Sports Med for evaluation.   Pt expressed understanding and is in agreement w/ plan.

## 2023-01-26 LAB — URINE CULTURE
MICRO NUMBER:: 15084699
Result:: NO GROWTH
SPECIMEN QUALITY:: ADEQUATE

## 2023-01-26 LAB — EXTRA URINE SPECIMEN

## 2023-01-29 ENCOUNTER — Telehealth: Payer: Self-pay

## 2023-01-29 NOTE — Telephone Encounter (Signed)
Pt seen results Via my chart  

## 2023-01-29 NOTE — Telephone Encounter (Signed)
-----   Message from Sheliah Hatch, MD sent at 01/28/2023  4:27 PM EDT ----- No evidence of UTI- great news!

## 2023-02-06 ENCOUNTER — Telehealth: Payer: Self-pay

## 2023-02-06 NOTE — Telephone Encounter (Signed)
Error

## 2023-02-21 ENCOUNTER — Telehealth: Payer: Self-pay | Admitting: Family Medicine

## 2023-02-21 NOTE — Telephone Encounter (Addendum)
Caller name: Abriella Filkins   On DPR?: Yes  Call back number: [phone] (home)  Provider they see: Sheliah Hatch, MD  Reason for call:Spouse called stating Jisella vomiting and dizziness. Sent her to Triage.   Patient Name First: Karen Last: Gonzales Gender: Female DOB: August 29, 1945 Age: 77 Y 8 M 20 D Return Phone Number: [phone] (Primary) Address: City/ State/ Zip: [place]  [address] Client [facility] [place] [facility] - [facility] Client Site [facility] Day Provider Lezlie Octave- MD Contact Type Call Who Is Calling Patient / Member / Family / Caregiver Call Type Triage / Clinical Caller Name Dorinda Hill Relationship To Patient Spouse Return Phone Number [phone] (Primary) Chief Complaint Dizziness Reason for Call Symptomatic / Request for Health Information Initial Comment Erie Noe from the office No appt available today Patient was vomiting and light headed and dizziness. Translation No Nurse Assessment Nurse: Zena Amos, RN, Margaret Date/Time (Eastern Time): 02/21/2023 1:20:31 PM Confirm and document reason for call. If symptomatic, describe symptoms. ---Caller states she has had some dizziness since january, was getting better but today has had a bad episode, threw up once with it. Took meclizine with no improvement. Does the patient have any new or worsening symptoms? ---Yes Will a triage be completed? ---Yes Related visit to physician within the last 2 weeks? ---No Does the PT have any chronic conditions? (i.e. diabetes, asthma, this includes High risk factors for pregnancy, etc.) ---Yes List chronic conditions. ---diabetes, HTN Is this a behavioral health or substance abuse call? ---No Guidelines Guideline Title Affirmed Question Affirmed Notes Nurse Date/Time (Eastern Time) Dizziness - Lightheadedness [1] MODERATE dizziness (e.g., interferes with normal activities) AND [2] has  NOT Zena Amos, RN, Claris Che 02/21/2023 1:23:04 PM PLEASE NOTE: All timestamps contained within this report are represented as [place] Standard Time. CONFIDENTIALTY NOTICE: This fax transmission is intended only for the addressee. It contains information that is legally privileged, confidential or otherwise protected from use or disclosure. If you are not the intended recipient, you are strictly prohibited from reviewing, disclosing, copying using or disseminating any of this information or taking any action in reliance on or regarding this information. If you have received this fax in error, please notify us immediately by telephone so that we can arrange for its return to [place]. Phone: [phone], Toll-Free: [phone], Fax: [phone] Page: 2 of 2 Call Id: [id number] Guidelines Guideline Title Affirmed Question Affirmed Notes Nurse Date/Time Lamount Cohen Time) been evaluated by doctor (or NP/PA) for this (Exception: Dizziness caused by heat exposure, sudden standing, or poor fluid intake.) Disp. Time Lamount Cohen Time) Disposition Final User 02/21/2023 1:27:59 PM See PCP within 24 Hours Yes Zena Amos, RN, Claris Che Final Disposition 02/21/2023 1:27:59 PM See PCP within 24 Hours Yes Vassallo, RN, Ruel Favors Disagree/Comply Comply Caller Understands Yes PreDisposition Call Doctor Care Advice Given Per Guideline SEE PCP WITHIN 24 HOURS: * IF OFFICE WILL BE OPEN: You need to be examined within the next 24 hours. Call your doctor (or NP/PA) when the office opens and make an appointment. DRINK FLUIDS: * Drink several glasses of fruit juice, other clear fluids or water. * This will improve hydration and blood glucose. LIE DOWN AND REST: * Lie down with feet elevated for 1 hour. * This will improve circulation and increase blood flow to the brain. CALL BACK IF: * Passes out (faints) * You become worse CARE ADVICE given per Dizziness (Adult) guideline. Comments User: Ronni Rumble, RN  Date/Time Lamount Cohen Time): 02/21/2023 1:33:32 PM Caller has an  appointment tomorrow at 920am Referrals REFERRED TO PCP OFFICE  Will call 02/22/2023 in the morning

## 2023-02-21 NOTE — Telephone Encounter (Signed)
Lets make her an appointment if triage does not advise otherwise

## 2023-02-22 ENCOUNTER — Other Ambulatory Visit: Payer: Self-pay

## 2023-02-22 ENCOUNTER — Encounter: Payer: Self-pay | Admitting: Family Medicine

## 2023-02-22 ENCOUNTER — Other Ambulatory Visit (HOSPITAL_COMMUNITY): Payer: Self-pay

## 2023-02-22 ENCOUNTER — Ambulatory Visit (HOSPITAL_BASED_OUTPATIENT_CLINIC_OR_DEPARTMENT_OTHER)
Admission: RE | Admit: 2023-02-22 | Discharge: 2023-02-22 | Disposition: A | Discharge status: Home / Self Care | Payer: PPO | Source: Ambulatory Visit | Attending: Family Medicine | Admitting: Family Medicine

## 2023-02-22 ENCOUNTER — Telehealth: Payer: Self-pay

## 2023-02-22 ENCOUNTER — Ambulatory Visit (INDEPENDENT_AMBULATORY_CARE_PROVIDER_SITE_OTHER): Payer: PPO | Admitting: Family Medicine

## 2023-02-22 VITALS — BP 138/72 | HR 95 | Temp 98.3°F | Resp 17 | Ht 65.0 in | Wt 184.2 lb

## 2023-02-22 DIAGNOSIS — M545 Low back pain, unspecified: Secondary | ICD-10-CM | POA: Insufficient documentation

## 2023-02-22 DIAGNOSIS — M549 Dorsalgia, unspecified: Secondary | ICD-10-CM | POA: Diagnosis not present

## 2023-02-22 DIAGNOSIS — R42 Dizziness and giddiness: Secondary | ICD-10-CM | POA: Diagnosis not present

## 2023-02-22 DIAGNOSIS — I10 Essential (primary) hypertension: Secondary | ICD-10-CM

## 2023-02-22 DIAGNOSIS — E1140 Type 2 diabetes mellitus with diabetic neuropathy, unspecified: Secondary | ICD-10-CM | POA: Diagnosis not present

## 2023-02-22 DIAGNOSIS — M419 Scoliosis, unspecified: Secondary | ICD-10-CM | POA: Diagnosis not present

## 2023-02-22 DIAGNOSIS — M47816 Spondylosis without myelopathy or radiculopathy, lumbar region: Secondary | ICD-10-CM | POA: Diagnosis not present

## 2023-02-22 MED ORDER — ONDANSETRON HCL 4 MG PO TABS: 4.0000 mg | ORAL_TABLET | Freq: Three times a day (TID) | ORAL | 0 refills | Status: DC | PRN

## 2023-02-22 MED ORDER — LOSARTAN POTASSIUM 50 MG PO TABS: 100.0000 mg | ORAL_TABLET | Freq: Every day | ORAL | 1 refills | Status: DC

## 2023-02-22 MED ORDER — GLIPIZIDE ER 10 MG PO TB24: 10.0000 mg | ORAL_TABLET | Freq: Every day | ORAL | 1 refills | Status: DC

## 2023-02-22 MED ORDER — GABAPENTIN 300 MG PO CAPS: 600.0000 mg | ORAL_CAPSULE | Freq: Three times a day (TID) | ORAL | 3 refills | Status: DC

## 2023-02-22 MED ORDER — METFORMIN HCL 500 MG PO TABS
500.0000 mg | ORAL_TABLET | Freq: Every day | ORAL | 1 refills | Status: DC
Start: 2023-02-22 — End: 2023-05-31

## 2023-02-22 MED ORDER — AMLODIPINE BESYLATE 10 MG PO TABS
10.0000 mg | ORAL_TABLET | Freq: Every day | ORAL | 1 refills | Status: DC
Start: 2023-02-22 — End: 2023-09-04

## 2023-02-22 MED ORDER — TIZANIDINE HCL 4 MG PO TABS: 4.0000 mg | ORAL_TABLET | Freq: Three times a day (TID) | ORAL | 3 refills | Status: DC | PRN

## 2023-02-22 MED ORDER — EMPAGLIFLOZIN 25 MG PO TABS: 25.0000 mg | ORAL_TABLET | Freq: Every day | ORAL | 1 refills | Status: DC

## 2023-02-22 NOTE — Telephone Encounter (Signed)
Pt is coming to see Dr Beverely Low this morning at 920 am

## 2023-02-22 NOTE — Patient Instructions (Signed)
Follow up as needed or as scheduled Go to [facility] and get your back xray USE the Tizanidine nightly as needed for back spasms TAKE the Ondansetron as needed for nausea We'll call you to schedule your neuro rehab appt Drink LOTS of water Call with any questions or concerns Hang in there!!!

## 2023-02-22 NOTE — Progress Notes (Signed)
   Subjective:    Patient ID: Karen Gonzales, female    DOB: 1945/11/29, 77 y.o.   MRN: [id number]  HPI Vertigo- pt was getting ready for a birthday party and 'it just hit me'.  Felt as if she was spinning.  Had vomiting.  This is 2nd 'bad' episode.  Took meclizine and stayed in recliner all day.  Today feels better but still a little 'swimmy headed'.  Pt reports she did not have sxs prior to valve replacement.   LBP- pt reports ongoing pain.  Has trouble sleeping due to pain.  Some relief w/ Tizanidine.  Is questioning fibromyalgia vs arthritis.  Wants to do an xray of lower back   Review of Systems For ROS see HPI     Objective:   Physical Exam Vitals reviewed.  Constitutional:      General: She is not in acute distress.    Appearance: Normal appearance. She is well-developed. She is not ill-appearing.  HENT:     Head: Normocephalic and atraumatic.     Mouth/Throat:     Mouth: Mucous membranes are normal.     Pharynx: Uvula midline.  Eyes:     Extraocular Movements: Extraocular movements intact and EOM normal.     Conjunctiva/sclera: Conjunctivae normal.     Pupils: Pupils are equal, round, and reactive to light.     Comments: 2-3 beats of horizontal nystagmus  Cardiovascular:     Rate and Rhythm: Normal rate and regular rhythm.     Heart sounds: Normal heart sounds.  Pulmonary:     Effort: Pulmonary effort is normal. No respiratory distress.     Breath sounds: Normal breath sounds. No wheezing or rales.  Musculoskeletal:        General: No edema.     Cervical back: Normal range of motion and neck supple.     Right lower leg: No edema.     Left lower leg: No edema.  Lymphadenopathy:     Cervical: No cervical adenopathy.  Skin:    General: Skin is warm and dry.  Neurological:     General: No focal deficit present.     Mental Status: She is alert and oriented to person, place, and time.     Cranial Nerves: No cranial nerve deficit.     Deep Tendon Reflexes: Reflexes  are normal and symmetric.  Psychiatric:        Mood and Affect: Mood and affect normal.        Behavior: Behavior normal.        Thought Content: Thought content normal.        Judgment: Judgment normal.           Assessment & Plan:  R sided LBP- new.  Pt reports she has not been able to sleep.  Some relief w/ Tizanidine.  Will get xrays of lumbar spine to assess for arthritis.  She can use the Tizanidine nightly before bed for relief of pain/spasm.  Will determine next steps based on xray results.

## 2023-02-22 NOTE — Telephone Encounter (Signed)
*  Primary  PA request received for Ondansetron HCl 4MG  tablets  PA submitted to [employer] via CMM through Fax and is pending determination  Key: [id number]

## 2023-02-25 NOTE — Telephone Encounter (Signed)
Is there any way we can resubmit? This was Rxd for nausea associated with vertigo?

## 2023-02-25 NOTE — Telephone Encounter (Signed)
Pharmacy Patient Advocate Encounter  Received notification from [employer]/ [employer] that Prior Authorization for Ondansetron has been DENIED because  .Marland Kitchen  PA #/Case ID/Reference #: [id number]  Please be advised we currently do not have a Pharmacist to review denials, therefore you will need to process appeals accordingly as needed. Thanks for your support at this time. Contact for appeals are as follows: Phone: [phone], Fax: [phone]

## 2023-02-25 NOTE — Telephone Encounter (Signed)
Ondansetron was denied?  It was prescribed for nausea associated w/ vertigo.  Treatment of nausea is its intended use

## 2023-02-25 NOTE — Telephone Encounter (Signed)
PA was denied  please advise how you would like to proceed

## 2023-02-27 NOTE — Telephone Encounter (Signed)
If they won't approve it, the only thing she can try is to use GoodRx.  They have it for $12 at [facility], $18 at [facility] [place], $19 at [facility]

## 2023-02-27 NOTE — Telephone Encounter (Addendum)
Vertigo, nausea and vomiting is not an approved indication for Part D. Per denial letter, Part D only covers ondansetron for: diagnosis of chemotherapy or radiation induced nausea/vomiting or prophylaxis of post-surgical nausea/vomiting, before induction of anesthesia or shortly after surgery.     Denial letter is attached to charts (in media).

## 2023-02-27 NOTE — Telephone Encounter (Signed)
"  Per medicare part D this is not a covered DX" please advise

## 2023-02-28 ENCOUNTER — Telehealth: Payer: Self-pay

## 2023-02-28 NOTE — Telephone Encounter (Signed)
I have spoken to the pt and advised that once we get the results I will contact her she expressed verbal understanding

## 2023-02-28 NOTE — Telephone Encounter (Signed)
I have spoke to the pt and informed her of the Xray results . She states she has an ortho DR and she will call them to schedule an apt

## 2023-02-28 NOTE — Telephone Encounter (Signed)
Pt will see about taking OTC anti nausea, also states she hasn't heard anything about back Xray I see where it was completed but there is no comment please advise

## 2023-02-28 NOTE — Telephone Encounter (Signed)
-----   Message from Neena Rhymes sent at 02/28/2023  3:50 PM EDT ----- Your xray shows some degree of scoliosis (curved spine) and degenerative changes consistent w/ arthritis.  Are you interested in an orthopedic referral for evaluation and treatment of you back?

## 2023-02-28 NOTE — Telephone Encounter (Signed)
Radiology results are very slow these days due to a radiologist shortage.  As soon as I get the results, I will let her know

## 2023-03-11 ENCOUNTER — Other Ambulatory Visit: Payer: Self-pay | Admitting: Family Medicine

## 2023-03-11 NOTE — Telephone Encounter (Signed)
Pt is currently taking this medication . Is it ok to refill ?

## 2023-03-11 NOTE — Telephone Encounter (Signed)
Pt has been made aware of the refill

## 2023-03-13 NOTE — Assessment & Plan Note (Signed)
Deteriorated.  Pt has hx of previous episodes but states this was severe.  Sxs improved w/ remaining still and using Meclizine.  She feels better today but still a little 'off'.  Never had any issues w/ vertigo until her valve replacement.  May be a residual effect of anesthesia- unclear at this time.  Will refer to Neuro rehab to work on minimizing sxs.  Pt expressed understanding and is in agreement w/ plan.

## 2023-03-20 ENCOUNTER — Encounter: Payer: Self-pay | Admitting: Family Medicine

## 2023-03-20 ENCOUNTER — Ambulatory Visit (INDEPENDENT_AMBULATORY_CARE_PROVIDER_SITE_OTHER): Payer: PPO | Admitting: Family Medicine

## 2023-03-20 ENCOUNTER — Other Ambulatory Visit (HOSPITAL_COMMUNITY): Payer: Self-pay

## 2023-03-20 VITALS — BP 138/80 | HR 77 | Temp 97.9°F | Resp 18 | Ht 65.0 in | Wt 187.1 lb

## 2023-03-20 DIAGNOSIS — Z7984 Long term (current) use of oral hypoglycemic drugs: Secondary | ICD-10-CM | POA: Diagnosis not present

## 2023-03-20 DIAGNOSIS — E1140 Type 2 diabetes mellitus with diabetic neuropathy, unspecified: Secondary | ICD-10-CM

## 2023-03-20 LAB — BASIC METABOLIC PANEL
BUN: 21 mg/dL (ref 6–23)
CO2: 25 mEq/L (ref 19–32)
Calcium: 10.4 mg/dL (ref 8.4–10.5)
Chloride: 100 mEq/L (ref 96–112)
Creatinine, Ser: 0.96 mg/dL (ref 0.40–1.20)
GFR: 57.76 mL/min — ABNORMAL LOW (ref >60.00)
Glucose, Bld: 144 mg/dL — ABNORMAL HIGH (ref 70–99)
Potassium: 4.9 mEq/L (ref 3.5–5.1)
Sodium: 134 mEq/L — ABNORMAL LOW (ref 135–145)

## 2023-03-20 LAB — HEMOGLOBIN A1C: Hgb A1c MFr Bld: 7.6 % — ABNORMAL HIGH (ref 4.6–6.5)

## 2023-03-20 NOTE — Progress Notes (Signed)
   Subjective:    Patient ID: Karen Gonzales, female    DOB: 05/22/1946, 77 y.o.   MRN: [id number]  HPI DM- chronic problem, on Jardiance 25mg  daily, Glipizide 10mg  daily, Metformin 500mg  daily, Januvia 100mg  daily.  UTD on eye exam, foot exam, microalbumin.  No CP, SOB above baseline, HA's, visual changes, edema.  Rare symptomatic lows.  Denies numbness/tingling of hands/feet.   Review of Systems For ROS see HPI     Objective:   Physical Exam Vitals reviewed.  Constitutional:      General: She is not in acute distress.    Appearance: Normal appearance. She is well-developed. She is not ill-appearing.  HENT:     Head: Normocephalic and atraumatic.  Eyes:     Conjunctiva/sclera: Conjunctivae normal.     Pupils: Pupils are equal, round, and reactive to light.  Neck:     Thyroid: No thyromegaly.  Cardiovascular:     Rate and Rhythm: Normal rate and regular rhythm.     Pulses: Normal pulses.     Heart sounds: Normal heart sounds. No murmur heard. Pulmonary:     Effort: Pulmonary effort is normal. No respiratory distress.     Breath sounds: Normal breath sounds.  Abdominal:     General: There is no distension.     Palpations: Abdomen is soft.     Tenderness: There is no abdominal tenderness.  Musculoskeletal:     Cervical back: Normal range of motion and neck supple.     Right lower leg: No edema.     Left lower leg: No edema.  Lymphadenopathy:     Cervical: No cervical adenopathy.  Skin:    General: Skin is warm and dry.  Neurological:     General: No focal deficit present.     Mental Status: She is alert and oriented to person, place, and time.  Psychiatric:        Mood and Affect: Mood normal.        Behavior: Behavior normal.           Assessment & Plan:

## 2023-03-20 NOTE — Patient Instructions (Signed)
Follow up in 3-4 months to recheck sugar, BP, and cholesterol We'll notify you of your lab results and make any changes if needed Continue to work on healthy diet and regular exercise- you can do it! Make sure you are drinking lots of water, changing positions slowly, increasing your potassium intakes- leafy greens, bananas, mustard, oranges Call with any questions or concerns Hang in there!!

## 2023-03-20 NOTE — Assessment & Plan Note (Signed)
Chronic problem.  Currently on Jardiance, Glipizide, Metformin, Januvia.  UTD on eye exam, foot exam, microalbumin.  Currently asymptomatic.  Check labs.  Adjust meds prn

## 2023-03-21 ENCOUNTER — Telehealth: Payer: Self-pay

## 2023-03-21 ENCOUNTER — Ambulatory Visit: Payer: PPO | Attending: Family Medicine

## 2023-03-21 DIAGNOSIS — R2681 Unsteadiness on feet: Secondary | ICD-10-CM | POA: Insufficient documentation

## 2023-03-21 DIAGNOSIS — R42 Dizziness and giddiness: Secondary | ICD-10-CM | POA: Diagnosis not present

## 2023-03-21 NOTE — Telephone Encounter (Signed)
Left results Via my chart  

## 2023-03-21 NOTE — Therapy (Signed)
OUTPATIENT PHYSICAL THERAPY VESTIBULAR EVALUATION     Patient Name: Karen Gonzales MRN: [id number] DOB:[birth year], 77 y.o., female Today's Date: 03/21/2023  END OF SESSION:  PT End of Session - 03/21/23 0930     Visit Number 1    Number of Visits 4    Date for PT Re-Evaluation 04/18/23    Authorization Type [employer]    PT Start Time 0930    PT Stop Time 1015    PT Time Calculation (min) 45 min             Past Medical History:  Diagnosis Date   Anti-cardiolipin antibody syndrome (HCC) 06/25/2011   Arthritis    Bronchitis    history of   COVID-19    Diabetes mellitus    DVT (deep venous thrombosis) (HCC) 06/25/2011   Fatty liver disease, nonalcoholic    determinde by Ultrasound 11/11   GERD (gastroesophageal reflux disease)    H/O vitamin D deficiency    Hiatal hernia    Hyperlipidemia    Hypertension    Hyponatremia    with diuretic use   Osteopenia    PONV (postoperative nausea and vomiting)    S/P TAVR (transcatheter aortic valve replacement) 08/28/2022   s/p TAVR with a 23 mm Edwards via the TF approach by Dr. Excell Seltzer & Dr. Delia Chimes   Vertigo    Past Surgical History:  Procedure Laterality Date   ABDOMINAL HYSTERECTOMY     ACHILLES TENDON SURGERY     bilateral    CARPAL TUNNEL RELEASE     bilateral    CHOLECYSTECTOMY  1969   open   CRANIOTOMY  07/09/2011   Procedure: CRANIOTOMY HEMATOMA EVACUATION SUBDURAL;  Surgeon: Temple Pacini;  Location: [facility];  Service: Neurosurgery;  Laterality: Right;  Excision of Skull Lesion-Right Right Parietal Crani w/ Resection of Skull Tumor (1 1/2 hours - to follow)   DILATION AND CURETTAGE OF UTERUS     INTRAOPERATIVE TRANSTHORACIC ECHOCARDIOGRAM N/A 08/28/2022   Procedure: INTRAOPERATIVE TRANSTHORACIC ECHOCARDIOGRAM;  Surgeon: Tonny Bollman, MD;  Location: [facility] OR;  Service: Open Heart Surgery;  Laterality: N/A;   JOINT REPLACEMENT  2004   left knee replacement    KNEE SURGERY     total of 6  surgeries on both knees    REPAIR PERONEAL TENDONS ANKLE     RIGHT/LEFT HEART CATH AND CORONARY ANGIOGRAPHY N/A 07/19/2022   Procedure: RIGHT/LEFT HEART CATH AND CORONARY ANGIOGRAPHY;  Surgeon: Kathleene Hazel, MD;  Location: [facility];  Service: Cardiovascular;  Laterality: N/A;   TRANSCATHETER AORTIC VALVE REPLACEMENT, TRANSFEMORAL N/A 08/28/2022   Procedure: Transcatheter Aortic Valve Replacement, Transfemoral Approach using Edwards 23 MM SAPIEN 3 Ultra;  Surgeon: Tonny Bollman, MD;  Location: [facility] OR;  Service: Open Heart Surgery;  Laterality: N/A;  Transfemoral   TUBAL LIGATION     TUMOR REMOVAL     on pelvic bone    Patient Active Problem List   Diagnosis Date Noted   Vertigo 02/22/2023   S/P TAVR (transcatheter aortic valve replacement) 08/28/2022   Nonrheumatic aortic valve stenosis 07/19/2022   Fatty liver 10/21/2020   Gastro-esophageal reflux disease without esophagitis 10/21/2020   Iron deficiency anemia 10/21/2020   Chronic venous insufficiency 10/09/2019   Obesity (BMI 30-39.9) 08/31/2019   Physical exam 10/10/2016   Gastroparesis due to DM (HCC) 06/05/2016   Type 2 diabetes mellitus with diabetic neuropathy, without long-term current use of insulin (HCC) 11/07/2015   Fibromyalgia 06/25/2011   Anti-cardiolipin  antibody syndrome (HCC) 06/25/2011   Essential hypertension 09/24/2007    PCP: Neena Rhymes, MD REFERRING PROVIDER: Sheliah Hatch, MD  REFERRING DIAG: R42 (ICD-10-CM) - Vertigo  THERAPY DIAG:  Dizziness and giddiness  ONSET DATE: January 2024  Rationale for Evaluation and Treatment: Rehabilitation  SUBJECTIVE:   SUBJECTIVE STATEMENT: Had TAVR in January 2024 and when awoke from surgery experienced onset of vertigo.  Notes it has been on/off presentation since that time.  Notes feeling sometimes comes on when laying down in bed, turning head certain ways, driving. Pt accompanied by: self  PERTINENT HISTORY: TAVR, DM, FM, back  pain  PAIN:  Are you having pain? No  PRECAUTIONS: None  RED FLAGS: None   WEIGHT BEARING RESTRICTIONS: No  FALLS: Has patient fallen in last 6 months? No  LIVING ENVIRONMENT: Lives with: lives with their family Lives in: Mobile home Stairs: No Has following equipment at home: None  PLOF: Independent  PATIENT GOALS: get rid of dizziness  OBJECTIVE:   DIAGNOSTIC FINDINGS:   COGNITION: Overall cognitive status: Within functional limits for tasks assessed   SENSATION: WFL   POSTURE:  No Significant postural limitations  Cervical ROM:   Grossly WFL  STRENGTH: NT    BED MOBILITY:  Indep, slower pace due to back pain  TRANSFERS: Assistive device utilized: None  Sit to stand: Complete Independence Stand to sit: Complete Independence Chair to chair: Complete Independence Floor:  NT    GAIT: Gait pattern: WFL Distance walked:  Assistive device utilized: None Level of assistance: Complete Independence Comments: notes increased unsteadiness due to vertigo episodes    PATIENT SURVEYS:  FOTO not loaded to system  VESTIBULAR ASSESSMENT:  GENERAL OBSERVATION: wears bifocals, reports cataract forming on left eye   SYMPTOM BEHAVIOR:  Subjective history: onset January 2024 with on/off presentation  Non-Vestibular symptoms: headaches  Type of dizziness: Spinning/Vertigo and "Funny feeling in the head"  Frequency: daily  Duration: seconds-minutes with residual off-balance  Aggravating factors: Induced by position change: lying supine, rolling to the right, rolling to the left, and supine to sit and Induced by motion: looking up at the ceiling, bending down to the ground, turning body quickly, turning head quickly, and driving  Relieving factors: closing eyes and slow movements  Progression of symptoms: unchanged  OCULOMOTOR EXAM:  Ocular Alignment: normal  Ocular ROM: No Limitations  Spontaneous Nystagmus: absent  Gaze-Induced Nystagmus:  absent  Smooth Pursuits: intact  Saccades: intact  Convergence/Divergence: 4 cm     VESTIBULAR - OCULAR REFLEX:   Slow VOR: Normal  VOR Cancellation: Comment: normal response, notes some dizziness with right rotation  Head-Impulse Test: NT  Dynamic Visual Acuity:    POSITIONAL TESTING: Right Dix-Hallpike: no nystagmus Left Dix-Hallpike: no nystagmus Right Roll Test: no nystagmus Left Roll Test: no nystagmus  MOTION SENSITIVITY:  Motion Sensitivity Quotient Intensity: 0 = none, 1 = Lightheaded, 2 = Mild, 3 = Moderate, 4 = Severe, 5 = Vomiting  Intensity  1. Sitting to supine   2. Supine to L side   3. Supine to R side   4. Supine to sitting   5. L Hallpike-Dix   6. Up from L    7. R Hallpike-Dix   8. Up from R    9. Sitting, head tipped to L knee   10. Head up from L knee   11. Sitting, head tipped to R knee   12. Head up from R knee   13. Sitting head turns x5  14.Sitting head nods x5   15. In stance, 180 turn to L    16. In stance, 180 turn to R     OTHOSTATICS: not done  FUNCTIONAL GAIT: Functional gait assessment: TBD  M-CTSIB  Condition 1: Firm Surface, EO 30 Sec, Normal Sway  Condition 2: Firm Surface, EC 30 Sec, Mild Sway  Condition 3: Foam Surface, EO 30 Sec, Mild Sway  Condition 4: Foam Surface, EC 30 Sec, Moderate Sway     VESTIBULAR TREATMENT:                                                                                                   DATE: 03/21/23  Canalith Repositioning:  Comment: no nystagmus/dizziness with positional testing today Gaze Adaptation:  N/A Habituation:  Brandt-Daroff: number of reps: 3 Other: provided for HEP and education in trials if/when vertigo occurs  PATIENT EDUCATION: Education details: education regarding vestibular anatomy and presentation of BPPV, HEP of habituation Person educated: Patient Education method: Explanation and Handouts Education comprehension: needs further education  HOME EXERCISE  PROGRAM: Access Code: [email or web address] URL: [email or web address] Date: 03/21/2023 Prepared by: Shary Decamp  Exercises - Brandt-Daroff Vestibular Exercise  - 1 x daily - 7 x weekly - 3-5 reps  GOALS: Goals reviewed with patient? Yes  SHORT TERM GOALS: Target date: same as LTG    LONG TERM GOALS: Target date: 04/18/2023    The patient will be independent with HEP for gaze adaptation, habituation, balance, and general mobility. Baseline:  Goal status: INITIAL  2.  Patient to report absence of positional dizziness to improve quality of life and reduce unsteadiness Baseline:  Goal status: INITIAL  3.  Demo improved postural stability per mild sway condition 4 M-CTSIB Baseline: moderate x 30 sec Goal status: INITIAL  4.  Demo low risk for falls per score 24/30 Functional Gait Assessment Baseline: TBD Goal status: INITIAL    ASSESSMENT:  CLINICAL IMPRESSION: Patient is a 77 y.o. lady who was seen today for physical therapy evaluation and treatment for vertigo.  Reports episodic dizziness since January 2024 with subjective report most likely indicating BPPV but unable to reproduce symptoms today with positional testing.  Demonstrates degree of unsteadiness via M-CTSIB and pt endorses greater difficulty in walking during episodes.  Demonstration and rehearsal of Brandt-Daroff as means to address positional dizziness.   Recommend continued sessions for follow-up to provide additional re-assessment as well as strategies/interventions for improved balance and stability in gait   OBJECTIVE IMPAIRMENTS: decreased balance, difficulty walking, and dizziness.   ACTIVITY LIMITATIONS: bending, reach over head, and locomotion level  PARTICIPATION LIMITATIONS: driving, shopping, and community activity  PERSONAL FACTORS: Age, Time since onset of injury/illness/exacerbation, and 1 comorbidity: DM  are also affecting patient's functional outcome.   REHAB POTENTIAL: Good  CLINICAL  DECISION MAKING: Stable/uncomplicated  EVALUATION COMPLEXITY: Low   PLAN:  PT FREQUENCY: 1x/week  PT DURATION: 4 weeks  PLANNED INTERVENTIONS: Therapeutic exercises, Therapeutic activity, Neuromuscular re-education, Balance training, Gait training, Patient/Family education, Self Care, Joint mobilization, Stair training, Vestibular training, Canalith repositioning, DME instructions, Aquatic Therapy, Dry Needling, Spinal mobilization, and  Manual therapy  PLAN FOR NEXT SESSION: re-assess positional dizziness, FGA, relevant HEP development   10:40 AM, 03/21/23 M. Shary Decamp, PT, DPT Physical Therapist- [place] Office Number: [phone]

## 2023-03-21 NOTE — Telephone Encounter (Signed)
-----   Message from Neena Rhymes sent at 03/21/2023  7:30 AM EDT ----- Labs are stable.  No changes at this time

## 2023-03-28 ENCOUNTER — Ambulatory Visit: Payer: PPO | Admitting: Physical Therapy

## 2023-03-28 ENCOUNTER — Encounter: Payer: Self-pay | Admitting: Physical Therapy

## 2023-03-28 DIAGNOSIS — R2681 Unsteadiness on feet: Secondary | ICD-10-CM

## 2023-03-28 DIAGNOSIS — R42 Dizziness and giddiness: Secondary | ICD-10-CM

## 2023-03-28 NOTE — Therapy (Signed)
OUTPATIENT PHYSICAL THERAPY VESTIBULAR TREATMENT     Patient Name: Karen Gonzales MRN: [id number] DOB:1946/07/04, 77 y.o., female Today's Date: 03/28/2023  END OF SESSION:  PT End of Session - 03/28/23 0931     Visit Number 2    Number of Visits 4    Date for PT Re-Evaluation 04/18/23    Authorization Type [employer]    PT Start Time 0931    PT Stop Time 1010    PT Time Calculation (min) 39 min    Activity Tolerance Patient tolerated treatment well    Behavior During Therapy [facility] for tasks assessed/performed              Past Medical History:  Diagnosis Date   Anti-cardiolipin antibody syndrome (HCC) 06/25/2011   Arthritis    Bronchitis    history of   COVID-19    Diabetes mellitus    DVT (deep venous thrombosis) (HCC) 06/25/2011   Fatty liver disease, nonalcoholic    determinde by Ultrasound 11/11   GERD (gastroesophageal reflux disease)    H/O vitamin D deficiency    Hiatal hernia    Hyperlipidemia    Hypertension    Hyponatremia    with diuretic use   Osteopenia    PONV (postoperative nausea and vomiting)    S/P TAVR (transcatheter aortic valve replacement) 08/28/2022   s/p TAVR with a 23 mm Edwards via the TF approach by Dr. Excell Seltzer & Dr. Delia Chimes   Vertigo    Past Surgical History:  Procedure Laterality Date   ABDOMINAL HYSTERECTOMY     ACHILLES TENDON SURGERY     bilateral    CARPAL TUNNEL RELEASE     bilateral    CHOLECYSTECTOMY  1969   open   CRANIOTOMY  07/09/2011   Procedure: CRANIOTOMY HEMATOMA EVACUATION SUBDURAL;  Surgeon: Kathaleen Maser Pool;  Location: [facility];  Service: Neurosurgery;  Laterality: Right;  Excision of Skull Lesion-Right Right Parietal Crani w/ Resection of Skull Tumor (1 1/2 hours - to follow)   DILATION AND CURETTAGE OF UTERUS     INTRAOPERATIVE TRANSTHORACIC ECHOCARDIOGRAM N/A 08/28/2022   Procedure: INTRAOPERATIVE TRANSTHORACIC ECHOCARDIOGRAM;  Surgeon: Tonny Bollman, MD;  Location: [facility] OR;  Service: Open Heart  Surgery;  Laterality: N/A;   JOINT REPLACEMENT  2004   left knee replacement    KNEE SURGERY     total of 6 surgeries on both knees    REPAIR PERONEAL TENDONS ANKLE     RIGHT/LEFT HEART CATH AND CORONARY ANGIOGRAPHY N/A 07/19/2022   Procedure: RIGHT/LEFT HEART CATH AND CORONARY ANGIOGRAPHY;  Surgeon: Kathleene Hazel, MD;  Location: [facility];  Service: Cardiovascular;  Laterality: N/A;   TRANSCATHETER AORTIC VALVE REPLACEMENT, TRANSFEMORAL N/A 08/28/2022   Procedure: Transcatheter Aortic Valve Replacement, Transfemoral Approach using Edwards 23 MM SAPIEN 3 Ultra;  Surgeon: Tonny Bollman, MD;  Location: [facility] OR;  Service: Open Heart Surgery;  Laterality: N/A;  Transfemoral   TUBAL LIGATION     TUMOR REMOVAL     on pelvic bone    Patient Active Problem List   Diagnosis Date Noted   Vertigo 02/22/2023   S/P TAVR (transcatheter aortic valve replacement) 08/28/2022   Nonrheumatic aortic valve stenosis 07/19/2022   Fatty liver 10/21/2020   Gastro-esophageal reflux disease without esophagitis 10/21/2020   Iron deficiency anemia 10/21/2020   Chronic venous insufficiency 10/09/2019   Obesity (BMI 30-39.9) 08/31/2019   Physical exam 10/10/2016   Gastroparesis due to DM (HCC) 06/05/2016   Type 2  diabetes mellitus with diabetic neuropathy, without long-term current use of insulin (HCC) 11/07/2015   Fibromyalgia 06/25/2011   Anti-cardiolipin antibody syndrome (HCC) 06/25/2011   Essential hypertension 09/24/2007    PCP: Neena Rhymes, MD REFERRING PROVIDER: Sheliah Hatch, MD  REFERRING DIAG: R42 (ICD-10-CM) - Vertigo  THERAPY DIAG:  Dizziness and giddiness  Unsteadiness on feet  ONSET DATE: January 2024  Rationale for Evaluation and Treatment: Rehabilitation  SUBJECTIVE:   SUBJECTIVE STATEMENT:  Feeling well today, only had one little spell of dizziness when I was looking down and it has a mind of its own. I thought it was gone but then it came back. No  falls or close calls.   Pt accompanied by: self  PERTINENT HISTORY: TAVR, DM, FM, back pain  PAIN:  Are you having pain? No 0/10, no dizziness right this second  PRECAUTIONS: None  RED FLAGS: None   WEIGHT BEARING RESTRICTIONS: No  FALLS: Has patient fallen in last 6 months? No  LIVING ENVIRONMENT: Lives with: lives with their family Lives in: Mobile home Stairs: No Has following equipment at home: None  PLOF: Independent  PATIENT GOALS: get rid of dizziness  OBJECTIVE:   DIAGNOSTIC FINDINGS:   COGNITION: Overall cognitive status: Within functional limits for tasks assessed   SENSATION: WFL   POSTURE:  No Significant postural limitations  Cervical ROM:   Grossly WFL  STRENGTH: NT    BED MOBILITY:  Indep, slower pace due to back pain  TRANSFERS: Assistive device utilized: None  Sit to stand: Complete Independence Stand to sit: Complete Independence Chair to chair: Complete Independence Floor:  NT    GAIT: Gait pattern: WFL Distance walked:  Assistive device utilized: None Level of assistance: Complete Independence Comments: notes increased unsteadiness due to vertigo episodes    PATIENT SURVEYS:  FOTO not loaded to system  VESTIBULAR ASSESSMENT:  GENERAL OBSERVATION: wears bifocals, reports cataract forming on left eye   SYMPTOM BEHAVIOR:  Subjective history: onset January 2024 with on/off presentation  Non-Vestibular symptoms: headaches  Type of dizziness: Spinning/Vertigo and "Funny feeling in the head"  Frequency: daily  Duration: seconds-minutes with residual off-balance  Aggravating factors: Induced by position change: lying supine, rolling to the right, rolling to the left, and supine to sit and Induced by motion: looking up at the ceiling, bending down to the ground, turning body quickly, turning head quickly, and driving  Relieving factors: closing eyes and slow movements  Progression of symptoms: unchanged  OCULOMOTOR  EXAM:  Ocular Alignment: normal  Ocular ROM: No Limitations  Spontaneous Nystagmus: absent  Gaze-Induced Nystagmus: absent  Smooth Pursuits: intact  Saccades: intact  Convergence/Divergence: 4 cm     VESTIBULAR - OCULAR REFLEX:   Slow VOR: Normal  VOR Cancellation: Comment: normal response, notes some dizziness with right rotation  Head-Impulse Test: NT  Dynamic Visual Acuity:    POSITIONAL TESTING: Right Dix-Hallpike: no nystagmus Left Dix-Hallpike: no nystagmus Right Roll Test: no nystagmus Left Roll Test: no nystagmus  MOTION SENSITIVITY:  Motion Sensitivity Quotient Intensity: 0 = none, 1 = Lightheaded, 2 = Mild, 3 = Moderate, 4 = Severe, 5 = Vomiting  Intensity  1. Sitting to supine   2. Supine to L side   3. Supine to R side   4. Supine to sitting   5. L Hallpike-Dix   6. Up from L    7. R Hallpike-Dix   8. Up from R    9. Sitting, head tipped to L knee   10.  Head up from L knee   11. Sitting, head tipped to R knee   12. Head up from R knee   13. Sitting head turns x5   14.Sitting head nods x5   15. In stance, 180 turn to L    16. In stance, 180 turn to R     OTHOSTATICS: not done  FUNCTIONAL GAIT: Functional gait assessment: TBD  M-CTSIB  Condition 1: Firm Surface, EO 30 Sec, Normal Sway  Condition 2: Firm Surface, EC 30 Sec, Mild Sway  Condition 3: Foam Surface, EO 30 Sec, Mild Sway  Condition 4: Foam Surface, EC 30 Sec, Moderate Sway     VESTIBULAR TREATMENT:                                                                                                   DATE:  03/28/23   All positional testing negative today, no nystagmus and asymptomatic   FGA 23/30  Head shakes and nods 2x60 seconds each standing eyes on target   Tandem stance 3x30 seconds B solid surface EO Narrow stance x90 seconds solid surface EO        03/21/23  Canalith Repositioning:  Comment: no nystagmus/dizziness with positional testing today Gaze  Adaptation:  N/A Habituation:  Brandt-Daroff: number of reps: 3 Other: provided for HEP and education in trials if/when vertigo occurs  PATIENT EDUCATION: Education details: education regarding vestibular anatomy and presentation of BPPV, HEP of habituation Person educated: Patient Education method: Explanation and Handouts Education comprehension: needs further education  HOME EXERCISE PROGRAM:   Access Code: [email or web address] URL: [email or web address] Date: 03/28/2023 Prepared by: Nedra Hai  Exercises - Seated Gaze Stabilization with Head Nod  - 1 x daily - 7 x weekly - 1 sets - 2 reps - 60 seconds  hold - Seated Gaze Stabilization with Head Rotation  - 1 x daily - 7 x weekly - 1 sets - 3-5 reps - 60 seconds  hold - Tandem Stance in Corner  - 1 x daily - 7 x weekly - 1 sets - 4 reps - 30 seconds  hold  GOALS: Goals reviewed with patient? Yes  SHORT TERM GOALS: Target date: same as LTG    LONG TERM GOALS: Target date: 04/18/2023    The patient will be independent with HEP for gaze adaptation, habituation, balance, and general mobility. Baseline:  Goal status: INITIAL  2.  Patient to report absence of positional dizziness to improve quality of life and reduce unsteadiness Baseline:  Goal status: INITIAL  3.  Demo improved postural stability per mild sway condition 4 M-CTSIB Baseline: moderate x 30 sec Goal status: INITIAL  4.  Demo low risk for falls per score 24/30 Functional Gait Assessment Baseline: 03/28/23- 23/30 Goal status: INITIAL    ASSESSMENT:  CLINICAL IMPRESSION:  Monisa arrives today doing well, only had one instance of vertigo when she looked down and back up quickly. Does report increased dizziness when moving quickly or with tasks such as riding in a truck over a bumpy road. All positional testing negative today, FGA score mildly impaired but did  OK. Otherwise worked on Media planner and balance this session. Will continue to  progress as able and tolerated. Did update HEP to focus more on rehabituation tasks as positional testing was asymptomatic and clear of nystagmus.   OBJECTIVE IMPAIRMENTS: decreased balance, difficulty walking, and dizziness.   ACTIVITY LIMITATIONS: bending, reach over head, and locomotion level  PARTICIPATION LIMITATIONS: driving, shopping, and community activity  PERSONAL FACTORS: Age, Time since onset of injury/illness/exacerbation, and 1 comorbidity: DM  are also affecting patient's functional outcome.   REHAB POTENTIAL: Good  CLINICAL DECISION MAKING: Stable/uncomplicated  EVALUATION COMPLEXITY: Low   PLAN:  PT FREQUENCY: 1x/week  PT DURATION: 4 weeks  PLANNED INTERVENTIONS: Therapeutic exercises, Therapeutic activity, Neuromuscular re-education, Balance training, Gait training, Patient/Family education, Self Care, Joint mobilization, Stair training, Vestibular training, Canalith repositioning, DME instructions, Aquatic Therapy, Dry Needling, Spinal mobilization, and Manual therapy  PLAN FOR NEXT SESSION: rehabituation activities and progressions, relevant HEP development- add more balance exercises next visit   Nedra Hai, PT, DPT 03/28/23 10:11 AM

## 2023-04-04 ENCOUNTER — Ambulatory Visit: Payer: PPO

## 2023-04-04 DIAGNOSIS — R42 Dizziness and giddiness: Secondary | ICD-10-CM | POA: Diagnosis not present

## 2023-04-04 DIAGNOSIS — R2681 Unsteadiness on feet: Secondary | ICD-10-CM

## 2023-04-04 NOTE — Therapy (Signed)
OUTPATIENT PHYSICAL THERAPY VESTIBULAR TREATMENT     Patient Name: Karen Gonzales MRN: [id number] DOB:1945-09-20, 77 y.o., female Today's Date: 04/04/2023  END OF SESSION:  PT End of Session - 04/04/23 0932     Visit Number 3    Number of Visits 4    Date for PT Re-Evaluation 04/18/23    Authorization Type [employer]    PT Start Time 0930    PT Stop Time 1015    PT Time Calculation (min) 45 min    Activity Tolerance Patient tolerated treatment well    Behavior During Therapy [facility] for tasks assessed/performed              Past Medical History:  Diagnosis Date   Anti-cardiolipin antibody syndrome (HCC) 06/25/2011   Arthritis    Bronchitis    history of   COVID-19    Diabetes mellitus    DVT (deep venous thrombosis) (HCC) 06/25/2011   Fatty liver disease, nonalcoholic    determinde by Ultrasound 11/11   GERD (gastroesophageal reflux disease)    H/O vitamin D deficiency    Hiatal hernia    Hyperlipidemia    Hypertension    Hyponatremia    with diuretic use   Osteopenia    PONV (postoperative nausea and vomiting)    S/P TAVR (transcatheter aortic valve replacement) 08/28/2022   s/p TAVR with a 23 mm Edwards via the TF approach by Dr. Excell Seltzer & Dr. Delia Chimes   Vertigo    Past Surgical History:  Procedure Laterality Date   ABDOMINAL HYSTERECTOMY     ACHILLES TENDON SURGERY     bilateral    CARPAL TUNNEL RELEASE     bilateral    CHOLECYSTECTOMY  1969   open   CRANIOTOMY  07/09/2011   Procedure: CRANIOTOMY HEMATOMA EVACUATION SUBDURAL;  Surgeon: Kathaleen Maser Pool;  Location: [facility];  Service: Neurosurgery;  Laterality: Right;  Excision of Skull Lesion-Right Right Parietal Crani w/ Resection of Skull Tumor (1 1/2 hours - to follow)   DILATION AND CURETTAGE OF UTERUS     INTRAOPERATIVE TRANSTHORACIC ECHOCARDIOGRAM N/A 08/28/2022   Procedure: INTRAOPERATIVE TRANSTHORACIC ECHOCARDIOGRAM;  Surgeon: Tonny Bollman, MD;  Location: [facility] OR;  Service: Open Heart  Surgery;  Laterality: N/A;   JOINT REPLACEMENT  2004   left knee replacement    KNEE SURGERY     total of 6 surgeries on both knees    REPAIR PERONEAL TENDONS ANKLE     RIGHT/LEFT HEART CATH AND CORONARY ANGIOGRAPHY N/A 07/19/2022   Procedure: RIGHT/LEFT HEART CATH AND CORONARY ANGIOGRAPHY;  Surgeon: Kathleene Hazel, MD;  Location: [facility];  Service: Cardiovascular;  Laterality: N/A;   TRANSCATHETER AORTIC VALVE REPLACEMENT, TRANSFEMORAL N/A 08/28/2022   Procedure: Transcatheter Aortic Valve Replacement, Transfemoral Approach using Edwards 23 MM SAPIEN 3 Ultra;  Surgeon: Tonny Bollman, MD;  Location: [facility] OR;  Service: Open Heart Surgery;  Laterality: N/A;  Transfemoral   TUBAL LIGATION     TUMOR REMOVAL     on pelvic bone    Patient Active Problem List   Diagnosis Date Noted   Vertigo 02/22/2023   S/P TAVR (transcatheter aortic valve replacement) 08/28/2022   Nonrheumatic aortic valve stenosis 07/19/2022   Fatty liver 10/21/2020   Gastro-esophageal reflux disease without esophagitis 10/21/2020   Iron deficiency anemia 10/21/2020   Chronic venous insufficiency 10/09/2019   Obesity (BMI 30-39.9) 08/31/2019   Physical exam 10/10/2016   Gastroparesis due to DM (HCC) 06/05/2016   Type 2  diabetes mellitus with diabetic neuropathy, without long-term current use of insulin (HCC) 11/07/2015   Fibromyalgia 06/25/2011   Anti-cardiolipin antibody syndrome (HCC) 06/25/2011   Essential hypertension 09/24/2007    PCP: Neena Rhymes, MD REFERRING PROVIDER: Sheliah Hatch, MD  REFERRING DIAG: R42 (ICD-10-CM) - Vertigo  THERAPY DIAG:  Dizziness and giddiness  Unsteadiness on feet  ONSET DATE: January 2024  Rationale for Evaluation and Treatment: Rehabilitation  SUBJECTIVE:   SUBJECTIVE STATEMENT:  Only had one incident of dizziness when turning too fast playing with the kids, the episodes aren't as frequent or intense.   Pt accompanied by:  self  PERTINENT HISTORY: TAVR, DM, FM, back pain  PAIN:  Are you having pain? No 0/10, no dizziness right this second  PRECAUTIONS: None  RED FLAGS: None   WEIGHT BEARING RESTRICTIONS: No  FALLS: Has patient fallen in last 6 months? No  LIVING ENVIRONMENT: Lives with: lives with their family Lives in: Mobile home Stairs: No Has following equipment at home: None  PLOF: Independent  PATIENT GOALS: get rid of dizziness  OBJECTIVE:   TODAY'S TREATMENT: 04/04/23 Activity Comments  Right/left roll test -no dizziness/no nystagmus  Left/right Dix-Hallpike -no dizziness/no nystagmus  M-CTSIB Condition 1: normal Condition 2: normal-mild Condition 3:normal Condition 4:mild-mod  Self-Head Impulse Does not reproduce   VOR x 1 -good speed, no deficits, no symptoms  Corner balance activities     PATIENT EDUCATION: Education details: education regarding vestibular anatomy and presentation of BPPV, HEP of habituation Person educated: Patient Education method: Explanation and Handouts Education comprehension: needs further education  HOME EXERCISE PROGRAM:  Access Code: [email or web address] URL: [email or web address] Date: 04/04/2023 Prepared by: Shary Decamp  Exercises - Brandt-Daroff Vestibular Exercise  - 1 x daily - 7 x weekly - 1-3 sets - 5 reps - Tandem Stance in Corner  - 1 x daily - 7 x weekly - 1 sets - 4 reps - 30 seconds  hold - Corner Balance Feet Together With Eyes Closed  - 1 x daily - 7 x weekly - 3 sets - 15 sec hold - Corner Balance Feet Together: Eyes Open With Head Turns  - 1 x daily - 7 x weekly - 3 sets - 3 reps - Corner Balance Feet Together: Eyes Closed With Head Turns  - 1 x daily - 7 x weekly - 3 sets - 3 reps  DIAGNOSTIC FINDINGS:   COGNITION: Overall cognitive status: Within functional limits for tasks assessed   SENSATION: WFL   POSTURE:  No Significant postural limitations  Cervical ROM:   Grossly WFL  STRENGTH: NT    BED  MOBILITY:  Indep, slower pace due to back pain  TRANSFERS: Assistive device utilized: None  Sit to stand: Complete Independence Stand to sit: Complete Independence Chair to chair: Complete Independence Floor:  NT    GAIT: Gait pattern: WFL Distance walked:  Assistive device utilized: None Level of assistance: Complete Independence Comments: notes increased unsteadiness due to vertigo episodes    PATIENT SURVEYS:  FOTO not loaded to system  VESTIBULAR ASSESSMENT:  GENERAL OBSERVATION: wears bifocals, reports cataract forming on left eye   SYMPTOM BEHAVIOR:  Subjective history: onset January 2024 with on/off presentation  Non-Vestibular symptoms: headaches  Type of dizziness: Spinning/Vertigo and "Funny feeling in the head"  Frequency: daily  Duration: seconds-minutes with residual off-balance  Aggravating factors: Induced by position change: lying supine, rolling to the right, rolling to the left, and supine to sit and Induced by motion: looking up at the  ceiling, bending down to the ground, turning body quickly, turning head quickly, and driving  Relieving factors: closing eyes and slow movements  Progression of symptoms: unchanged  OCULOMOTOR EXAM:  Ocular Alignment: normal  Ocular ROM: No Limitations  Spontaneous Nystagmus: absent  Gaze-Induced Nystagmus: absent  Smooth Pursuits: intact  Saccades: intact  Convergence/Divergence: 4 cm     VESTIBULAR - OCULAR REFLEX:   Slow VOR: Normal  VOR Cancellation: Comment: normal response, notes some dizziness with right rotation  Head-Impulse Test: NT  Dynamic Visual Acuity:    POSITIONAL TESTING: Right Dix-Hallpike: no nystagmus Left Dix-Hallpike: no nystagmus Right Roll Test: no nystagmus Left Roll Test: no nystagmus  MOTION SENSITIVITY:  Motion Sensitivity Quotient Intensity: 0 = none, 1 = Lightheaded, 2 = Mild, 3 = Moderate, 4 = Severe, 5 = Vomiting  Intensity  1. Sitting to supine   2. Supine to L side    3. Supine to R side   4. Supine to sitting   5. L Hallpike-Dix   6. Up from L    7. R Hallpike-Dix   8. Up from R    9. Sitting, head tipped to L knee   10. Head up from L knee   11. Sitting, head tipped to R knee   12. Head up from R knee   13. Sitting head turns x5   14.Sitting head nods x5   15. In stance, 180 turn to L    16. In stance, 180 turn to R     OTHOSTATICS: not done  FUNCTIONAL GAIT: Functional gait assessment: TBD  M-CTSIB  Condition 1: Firm Surface, EO 30 Sec, Normal Sway  Condition 2: Firm Surface, EC 30 Sec, Mild Sway  Condition 3: Foam Surface, EO 30 Sec, Mild Sway  Condition 4: Foam Surface, EC 30 Sec, Moderate Sway          GOALS: Goals reviewed with patient? Yes  SHORT TERM GOALS: Target date: same as LTG    LONG TERM GOALS: Target date: 04/18/2023    The patient will be independent with HEP for gaze adaptation, habituation, balance, and general mobility. Baseline:  Goal status: MET  2.  Patient to report absence of positional dizziness to improve quality of life and reduce unsteadiness Baseline:  Goal status: MET  3.  Demo improved postural stability per mild sway condition 4 M-CTSIB Baseline: moderate x 30 sec Goal status: IN PROGRESS  4.  Demo low risk for falls per score 24/30 Functional Gait Assessment Baseline: 03/28/23- 23/30 Goal status: IN PROGRESS    ASSESSMENT:  CLINICAL IMPRESSION: Returns to clinic with report of minimal issues of dizziness and only one incident over past week where she reports turning/bending too fast to grab something with symptoms lasting a few seconds.  Positional testing repeated without symptoms for canal/cupulolithiasis and no sense of motion sensitivity with position changes either.  Review of assessment details with improved stability under condition 4 M-CTSIB and no provocation or deficits with VOR testing.  Pt notes overall improvement and discussion of pathology and instructed in updated HEP  for habituation if BPPV returns and rehearsal of continued balance activities that would be beneficial for maintenance.  Good return demonstration and we have concluded to go on a 30-day hold for home performance and to intervene if BPPV reappears.    OBJECTIVE IMPAIRMENTS: decreased balance, difficulty walking, and dizziness.   ACTIVITY LIMITATIONS: bending, reach over head, and locomotion level  PARTICIPATION LIMITATIONS: driving, shopping, and community activity  PERSONAL  FACTORS: Age, Time since onset of injury/illness/exacerbation, and 1 comorbidity: DM  are also affecting patient's functional outcome.   REHAB POTENTIAL: Good  CLINICAL DECISION MAKING: Stable/uncomplicated  EVALUATION COMPLEXITY: Low   PLAN:  PT FREQUENCY: 1x/week  PT DURATION: 4 weeks  PLANNED INTERVENTIONS: Therapeutic exercises, Therapeutic activity, Neuromuscular re-education, Balance training, Gait training, Patient/Family education, Self Care, Joint mobilization, Stair training, Vestibular training, Canalith repositioning, DME instructions, Aquatic Therapy, Dry Needling, Spinal mobilization, and Manual therapy  PLAN FOR NEXT SESSION: 30-day hold   10:14 AM, 04/04/23 M. Shary Decamp, PT, DPT Physical Therapist- [place] Office Number: [phone]

## 2023-04-05 ENCOUNTER — Other Ambulatory Visit: Payer: Self-pay | Admitting: Family Medicine

## 2023-04-11 ENCOUNTER — Ambulatory Visit: Payer: PPO | Admitting: Physical Therapy

## 2023-04-18 ENCOUNTER — Ambulatory Visit: Payer: PPO | Admitting: Physical Therapy

## 2023-04-20 ENCOUNTER — Other Ambulatory Visit: Payer: Self-pay | Admitting: Family Medicine

## 2023-05-04 ENCOUNTER — Other Ambulatory Visit: Payer: Self-pay | Admitting: Family Medicine

## 2023-05-06 NOTE — Telephone Encounter (Signed)
Patient is requesting a refill of the following medications: Requested Prescriptions   Pending Prescriptions Disp Refills   meloxicam (MOBIC) 15 MG tablet [Pharmacy Med Name: MELOXICAM 15 MG TABLET] 30 tablet 0    Sig: TAKE 1 TABLET BY MOUTH EVERY DAY WITH A MEAL    Date of patient request: 05/06/23 Last office visit: 03/20/23 Date of last refill: 04/05/23 Last refill amount: 30 Follow up time period per chart:  3-4 months

## 2023-05-23 ENCOUNTER — Other Ambulatory Visit (HOSPITAL_BASED_OUTPATIENT_CLINIC_OR_DEPARTMENT_OTHER): Payer: Self-pay | Admitting: Cardiology

## 2023-05-23 DIAGNOSIS — E782 Mixed hyperlipidemia: Secondary | ICD-10-CM

## 2023-05-23 DIAGNOSIS — E1140 Type 2 diabetes mellitus with diabetic neuropathy, unspecified: Secondary | ICD-10-CM

## 2023-05-31 ENCOUNTER — Other Ambulatory Visit: Payer: Self-pay | Admitting: Family Medicine

## 2023-05-31 DIAGNOSIS — E1140 Type 2 diabetes mellitus with diabetic neuropathy, unspecified: Secondary | ICD-10-CM

## 2023-05-31 NOTE — Telephone Encounter (Signed)
Patient is requesting a refill of the following medications: Requested Prescriptions   Pending Prescriptions Disp Refills   meloxicam (MOBIC) 15 MG tablet [Pharmacy Med Name: MELOXICAM 15 MG TABLET] 30 tablet 0    Sig: TAKE 1 TABLET BY MOUTH EVERY DAY WITH A MEAL   Signed Prescriptions Disp Refills   metFORMIN (GLUCOPHAGE) 500 MG tablet 360 tablet 1    Sig: TAKE 2 TABLETS BY MOUTH 2 TIMES DAILY WITH A MEAL.    Authorizing Provider: Sheliah Hatch    Ordering User: Eldred Manges    Date of patient request: 05/31/23 Last office visit: 03/20/23 Date of last refill: 05/06/23 Last refill amount: 30 Follow up time period per chart: 4 months

## 2023-06-11 ENCOUNTER — Telehealth: Payer: Self-pay

## 2023-06-11 NOTE — Telephone Encounter (Signed)
Sent to Dr.Tabori.

## 2023-06-19 ENCOUNTER — Other Ambulatory Visit: Payer: Self-pay | Admitting: Family Medicine

## 2023-06-21 ENCOUNTER — Encounter: Payer: Self-pay | Admitting: Family Medicine

## 2023-06-21 ENCOUNTER — Ambulatory Visit: Payer: PPO | Admitting: Family Medicine

## 2023-06-21 VITALS — BP 136/70 | HR 80 | Temp 97.9°F | Ht 64.0 in | Wt 186.2 lb

## 2023-06-21 DIAGNOSIS — E876 Hypokalemia: Secondary | ICD-10-CM | POA: Diagnosis not present

## 2023-06-21 DIAGNOSIS — Z7984 Long term (current) use of oral hypoglycemic drugs: Secondary | ICD-10-CM | POA: Diagnosis not present

## 2023-06-21 DIAGNOSIS — Z23 Encounter for immunization: Secondary | ICD-10-CM

## 2023-06-21 DIAGNOSIS — E1169 Type 2 diabetes mellitus with other specified complication: Secondary | ICD-10-CM

## 2023-06-21 DIAGNOSIS — E1140 Type 2 diabetes mellitus with diabetic neuropathy, unspecified: Secondary | ICD-10-CM | POA: Diagnosis not present

## 2023-06-21 DIAGNOSIS — I10 Essential (primary) hypertension: Secondary | ICD-10-CM | POA: Diagnosis not present

## 2023-06-21 DIAGNOSIS — E785 Hyperlipidemia, unspecified: Secondary | ICD-10-CM | POA: Diagnosis not present

## 2023-06-21 LAB — BASIC METABOLIC PANEL
BUN: 20 mg/dL (ref 6–23)
CO2: 26 meq/L (ref 19–32)
Calcium: 10.2 mg/dL (ref 8.4–10.5)
Chloride: 100 meq/L (ref 96–112)
Creatinine, Ser: 0.96 mg/dL (ref 0.40–1.20)
GFR: 57.66 mL/min — ABNORMAL LOW (ref >60.00)
Glucose, Bld: 121 mg/dL — ABNORMAL HIGH (ref 70–99)
Potassium: 5.5 meq/L — ABNORMAL HIGH (ref 3.5–5.1)
Sodium: 135 meq/L (ref 135–145)

## 2023-06-21 LAB — CBC WITH DIFFERENTIAL/PLATELET
Basophils Absolute: 0.1 10*3/uL (ref 0.0–0.1)
Basophils Relative: 1 % (ref 0.0–3.0)
Eosinophils Absolute: 0.3 10*3/uL (ref 0.0–0.7)
Eosinophils Relative: 3.6 % (ref 0.0–5.0)
HCT: 37.2 % (ref 36.0–46.0)
Hemoglobin: 11.9 g/dL — ABNORMAL LOW (ref 12.0–15.0)
Lymphocytes Relative: 29.3 % (ref 12.0–46.0)
Lymphs Abs: 2.2 10*3/uL (ref 0.7–4.0)
MCHC: 31.9 g/dL (ref 30.0–36.0)
MCV: 81.7 fL (ref 78.0–100.0)
Monocytes Absolute: 0.7 10*3/uL (ref 0.1–1.0)
Monocytes Relative: 9.7 % (ref 3.0–12.0)
Neutro Abs: 4.3 10*3/uL (ref 1.4–7.7)
Neutrophils Relative %: 56.4 % (ref 43.0–77.0)
Platelets: 199 10*3/uL (ref 150.0–400.0)
RBC: 4.56 Mil/uL (ref 3.87–5.11)
RDW: 14.9 % (ref 11.5–15.5)
WBC: 7.6 10*3/uL (ref 4.0–10.5)

## 2023-06-21 LAB — LIPID PANEL
Cholesterol: 129 mg/dL (ref 0–200)
HDL: 46.9 mg/dL (ref >39.00)
LDL Cholesterol: 47 mg/dL (ref 0–99)
NonHDL: 82.02
Total CHOL/HDL Ratio: 3
Triglycerides: 177 mg/dL — ABNORMAL HIGH (ref 0.0–149.0)
VLDL: 35.4 mg/dL (ref 0.0–40.0)

## 2023-06-21 LAB — TSH: TSH: 1.03 u[IU]/mL (ref 0.35–5.50)

## 2023-06-21 LAB — HEPATIC FUNCTION PANEL
ALT: 35 U/L (ref 0–35)
AST: 36 U/L (ref 0–37)
Albumin: 4.6 g/dL (ref 3.5–5.2)
Alkaline Phosphatase: 59 U/L (ref 39–117)
Bilirubin, Direct: 0.1 mg/dL (ref 0.0–0.3)
Total Bilirubin: 0.6 mg/dL (ref 0.2–1.2)
Total Protein: 8.4 g/dL — ABNORMAL HIGH (ref 6.0–8.3)

## 2023-06-21 LAB — HEMOGLOBIN A1C: Hgb A1c MFr Bld: 7.7 % — ABNORMAL HIGH (ref 4.6–6.5)

## 2023-06-21 NOTE — Patient Instructions (Signed)
Follow up in 3 months to recheck sugar We'll notify you of your lab results and make any changes if needed Call your orthopedic doctor about your back Have your eye doctor send me a copy of your eye exam Call with any questions or concerns Stay Safe!  Stay Healthy! Happy Fall!!!

## 2023-06-21 NOTE — Assessment & Plan Note (Signed)
Chronic problem, on Crestor '10mg'$  daily w/o difficulty.  Check labs.  Adjust meds prn  ?

## 2023-06-21 NOTE — Assessment & Plan Note (Signed)
Chronic problem.  Currently adequate control on Losartan 100mg  and Amlodipine 10mg  daily.  Asymptomatic w/ exception of intermittent SOB.  Check labs due ARB use but no anticipated med changes.  Will follow.

## 2023-06-21 NOTE — Progress Notes (Signed)
   Subjective:    Patient ID: Karen Gonzales, female    DOB: 11-15-1945, 77 y.o.   MRN: [id number]  HPI DM- chronic problem, on Januvia 100mg  daily, Metformin 1000mg  BID, Glipizide XL 10mg  daily, Jardiance 25mg  daily.  Due for foot exam.  Eye exam is scheduled.  UTD on microalbumin.  Pt has had a few symptomatic lows.  Denies numbness/tingling of hands/feet.  Hyperlipidemia- on Crestor 10mg  daily.  No abd pain, N/V.  HTN- chronic problem, on Losartan 100mg  daily and Amlodipine 10mg  daily w/ adequate control.  No CP, HA's, visual changes, edema.  + SOB at times   Review of Systems For ROS see HPI     Objective:   Physical Exam Vitals reviewed.  Constitutional:      General: She is not in acute distress.    Appearance: Normal appearance. She is well-developed. She is obese. She is not ill-appearing.  HENT:     Head: Normocephalic and atraumatic.  Eyes:     Conjunctiva/sclera: Conjunctivae normal.     Pupils: Pupils are equal, round, and reactive to light.  Neck:     Thyroid: No thyromegaly.  Cardiovascular:     Rate and Rhythm: Normal rate and regular rhythm.     Pulses: Normal pulses.     Heart sounds: Normal heart sounds. No murmur heard. Pulmonary:     Effort: Pulmonary effort is normal. No respiratory distress.     Breath sounds: Normal breath sounds.  Abdominal:     General: There is no distension.     Palpations: Abdomen is soft.     Tenderness: There is no abdominal tenderness.  Musculoskeletal:     Cervical back: Normal range of motion and neck supple.     Right lower leg: No edema.     Left lower leg: No edema.  Lymphadenopathy:     Cervical: No cervical adenopathy.  Skin:    General: Skin is warm and dry.  Neurological:     General: No focal deficit present.     Mental Status: She is alert and oriented to person, place, and time.  Psychiatric:        Mood and Affect: Mood normal.        Behavior: Behavior normal.        Thought Content: Thought content  normal.           Assessment & Plan:

## 2023-06-21 NOTE — Assessment & Plan Note (Signed)
Chronic problem, on Januvia, Metformin, Glipizide XL, Jardiance w/o difficulty.  Foot exam done today.  Eye exam scheduled.  UTD on microalbumin.  Some symptomatic lows if she skips meals.  Check labs.  Adjust meds prn

## 2023-06-24 ENCOUNTER — Other Ambulatory Visit: Payer: Self-pay | Admitting: Family Medicine

## 2023-06-24 NOTE — Addendum Note (Signed)
Addended by: Eldred Manges on: 06/24/2023 11:47 AM   Modules accepted: Orders

## 2023-06-25 ENCOUNTER — Other Ambulatory Visit: Payer: Self-pay | Admitting: Orthopedic Surgery

## 2023-06-25 DIAGNOSIS — M5126 Other intervertebral disc displacement, lumbar region: Secondary | ICD-10-CM

## 2023-06-25 DIAGNOSIS — M25561 Pain in right knee: Secondary | ICD-10-CM | POA: Diagnosis not present

## 2023-06-26 NOTE — Telephone Encounter (Signed)
 Forms completed and returned to [place]

## 2023-06-26 NOTE — Telephone Encounter (Signed)
FAXED

## 2023-06-27 DIAGNOSIS — E119 Type 2 diabetes mellitus without complications: Secondary | ICD-10-CM | POA: Diagnosis not present

## 2023-06-27 DIAGNOSIS — H40013 Open angle with borderline findings, low risk, bilateral: Secondary | ICD-10-CM | POA: Diagnosis not present

## 2023-06-27 DIAGNOSIS — H43393 Other vitreous opacities, bilateral: Secondary | ICD-10-CM | POA: Diagnosis not present

## 2023-06-27 DIAGNOSIS — H40053 Ocular hypertension, bilateral: Secondary | ICD-10-CM | POA: Diagnosis not present

## 2023-06-27 DIAGNOSIS — H53143 Visual discomfort, bilateral: Secondary | ICD-10-CM | POA: Diagnosis not present

## 2023-06-27 LAB — HM DIABETES EYE EXAM

## 2023-06-28 ENCOUNTER — Telehealth: Payer: Self-pay | Admitting: Family Medicine

## 2023-06-28 ENCOUNTER — Telehealth: Payer: Self-pay

## 2023-06-28 ENCOUNTER — Other Ambulatory Visit (INDEPENDENT_AMBULATORY_CARE_PROVIDER_SITE_OTHER): Payer: PPO

## 2023-06-28 DIAGNOSIS — E876 Hypokalemia: Secondary | ICD-10-CM

## 2023-06-28 LAB — BASIC METABOLIC PANEL
BUN: 26 mg/dL — ABNORMAL HIGH (ref 6–23)
CO2: 28 meq/L (ref 19–32)
Calcium: 9.6 mg/dL (ref 8.4–10.5)
Chloride: 101 meq/L (ref 96–112)
Creatinine, Ser: 0.89 mg/dL (ref 0.40–1.20)
GFR: 63.13 mL/min (ref >60.00)
Glucose, Bld: 175 mg/dL — ABNORMAL HIGH (ref 70–99)
Potassium: 4.5 meq/L (ref 3.5–5.1)
Sodium: 136 meq/L (ref 135–145)

## 2023-06-28 NOTE — Telephone Encounter (Signed)
 Lab results have been discussed.   Verbalized understanding? Yes  Are there any questions? No

## 2023-06-28 NOTE — Telephone Encounter (Signed)
-----   Message from Neena Rhymes sent at 06/28/2023  3:15 PM EST ----- Potassium is now normal- great news!  No changes at this time

## 2023-06-28 NOTE — Telephone Encounter (Signed)
Called patient back and delivered results

## 2023-06-28 NOTE — Telephone Encounter (Signed)
Caller name: SHONELL DUTCHOVER  On DPR?: Yes  Call back number: [phone] (mobile)  Provider they see: Sheliah Hatch, MD  Reason for call: Returning call regarding lab results, told her someone would call her back with results

## 2023-07-05 ENCOUNTER — Telehealth: Payer: Self-pay | Admitting: Family Medicine

## 2023-07-05 NOTE — Telephone Encounter (Signed)
Placed in folder at nurse station

## 2023-07-05 NOTE — Telephone Encounter (Signed)
Type of form received: Patient Assistance Program Enrollment  Additional comments:   Received by: [employer]  Form should be Faxed/mailed to: (address/ fax #) [phone]  Is patient requesting call for pickup: Yes  Form placed:  Labeled & placed in provider bin  Attach charge sheet.  Provider will determine charge.  Individual made aware of 3-5 business day turn around Yes?

## 2023-07-10 DIAGNOSIS — Z0279 Encounter for issue of other medical certificate: Secondary | ICD-10-CM

## 2023-07-10 NOTE — Telephone Encounter (Signed)
Faxed -placed in scan bin up front

## 2023-07-10 NOTE — Telephone Encounter (Signed)
Forms completed and returned to [place]

## 2023-07-14 ENCOUNTER — Other Ambulatory Visit: Payer: Self-pay | Admitting: Family Medicine

## 2023-07-18 ENCOUNTER — Ambulatory Visit
Admission: RE | Admit: 2023-07-18 | Discharge: 2023-07-18 | Disposition: A | Discharge status: Home / Self Care | Payer: PPO | Source: Ambulatory Visit | Attending: Orthopedic Surgery | Admitting: Orthopedic Surgery

## 2023-07-18 DIAGNOSIS — M5126 Other intervertebral disc displacement, lumbar region: Secondary | ICD-10-CM

## 2023-07-18 DIAGNOSIS — M47816 Spondylosis without myelopathy or radiculopathy, lumbar region: Secondary | ICD-10-CM | POA: Diagnosis not present

## 2023-07-18 DIAGNOSIS — M419 Scoliosis, unspecified: Secondary | ICD-10-CM | POA: Diagnosis not present

## 2023-07-23 ENCOUNTER — Encounter: Payer: Self-pay | Admitting: Pharmacist

## 2023-07-23 NOTE — Progress Notes (Signed)
07/23/2023 Name: Karen Gonzales MRN: [id number] DOB: [birth year]  Chief Complaint  Patient presents with   Medication Management    GESELLE GIASSON is a 77 y.o. year old female who presented for a telephone visit.   They were referred to the pharmacist by  for assistance in re-enrollment for 2025 medication assistance program.       Subjective:  Care Team: Primary Care Provider: Sheliah Hatch, MD ; Next Scheduled Visit: 09/23/2023 Cardiologist: Dr Cristal Deer; Next Scheduled Visit: 08/13/2023  Medication Access/Adherence  Patient was enrolled in [facility] Cares medication assistance program in 2024 and received Jardiance.  She also was enrolled in [employer] medication assistance program in 2024 and received Januvia.  It looks like she brought in application for [employer] to office and it was faxed to medication assistance program 07/05/2023. However I called [employer] and they do not have a record of an application for 2025 re-enrollment for Jardiance  Mrs. Zocco also reports she dropped off Januvia application but I don't see any notation in her chart.   Current Pharmacy:  [facility] [phone] - [place], [place] - [address] [place] [address] Phone: [phone] Fax: [phone]  [facility] - [place], [place] - [address] [place] [address] Phone: [phone] Fax: [phone]   Patient reports affordability concerns with their medications: Yes  Patient reports access/transportation concerns to their pharmacy: No  Patient reports adherence concerns with their medications:  No      Objective:  Lab Results  Component Value Date   HGBA1C 7.7 (H) 06/21/2023    Lab Results  Component Value Date   CREATININE 0.89 06/28/2023   BUN 26 (H) 06/28/2023   NA 136 06/28/2023   K 4.5 06/28/2023   CL 101 06/28/2023   CO2 28 06/28/2023    Lab Results  Component  Value Date   CHOL 129 06/21/2023   HDL 46.90 06/21/2023   LDLCALC 47 06/21/2023   LDLDIRECT 104.0 04/08/2017   TRIG 177.0 (H) 06/21/2023   CHOLHDL 3 06/21/2023    Medications Reviewed Today     Reviewed by Henrene Pastor, RPH-CPP (Pharmacist) on 07/23/23 at 1251  Med List Status: <None>   Medication Order Taking? Sig Documenting Provider Last Dose Status Informant  amLODipine (NORVASC) 10 MG tablet [id number] No Take 1 tablet (10 mg total) by mouth daily. Sheliah Hatch, MD Taking Active   amoxicillin (AMOXIL) 500 MG tablet [id number] No Take 4 tablets (2,000 mg total) by mouth as directed. 1 hour prior to dental work including cleanings Allena Katz Taking Active   aspirin EC 81 MG tablet [id number] No Take 1 tablet (81 mg total) by mouth daily. Swallow whole. Jodelle Red, MD Taking Active Self  Blood Glucose Monitoring Suppl (ONE TOUCH ULTRA 2) w/Device KIT [id number] No Pt uses glucometer to check sugars twice daily. Dx E11.9 Sheliah Hatch, MD Taking Active Self  empagliflozin (JARDIANCE) 25 MG TABS tablet [id number] No Take 1 tablet (25 mg total) by mouth daily. Sheliah Hatch, MD Taking Active   gabapentin (NEURONTIN) 300 MG capsule [id number] No Take 2 capsules (600 mg total) by mouth 3 (three) times daily. Sheliah Hatch, MD Taking Active   glipiZIDE (GLUCOTROL XL) 10 MG 24 hr tablet [id number] No Take 1 tablet (10 mg total) by mouth daily. Sheliah Hatch, MD Taking Active   Homeopathic Products (RESTFUL  LEGS SL) [id number] No Place 2 tablets under the tongue daily as needed (restless legs). [provider] Taking Active Self  Lancets Letta Pate DELICA PLUS [place]) MISC [id number]  USE WHEN CHECKING BLOOD SUGAR AS DIRECTED Sheliah Hatch, MD  Active   losartan (COZAAR) 50 MG tablet [id number] No Take 2 tablets (100 mg total) by mouth daily. Sheliah Hatch, MD Taking Active   meclizine (ANTIVERT) 25 MG tablet [id number] No Take 1  tablet (25 mg total) by mouth 3 (three) times daily as needed for dizziness. Sheliah Hatch, MD Taking Active   meloxicam [facility]) 15 MG tablet [id number]  TAKE 1 TABLET BY MOUTH EVERY DAY WITH A MEAL Sheliah Hatch, MD  Active   Menthol-Methyl Salicylate (MUSCLE RUB) 10-15 % CREA [id number] No Apply 1 Application topically as needed for muscle pain. [provider] Taking Active Self  metFORMIN (GLUCOPHAGE) 500 MG tablet [id number]  TAKE 2 TABLETS BY MOUTH 2 TIMES DAILY WITH A MEAL. Sheliah Hatch, MD  Active   omeprazole (PRILOSEC) 40 MG capsule [id number] No Take 1 capsule (40 mg total) by mouth 2 (two) times daily.  Patient taking differently: Take 40 mg by mouth daily.   Sheliah Hatch, MD Taking Active Self  Koren Bound test strip [id number] No CHECK BLOOD SUGAR 1-2 TIMES DAILY Sheliah Hatch, MD Taking Active Self  oxymetazoline (AFRIN) 0.05 % nasal spray [id number] No Place 1 spray into both nostrils 2 (two) times daily as needed for congestion. [provider] Taking Active Self  rosuvastatin (CRESTOR) 10 MG tablet [id number]  TAKE 1 TABLET BY MOUTH EVERY DAY Jodelle Red, MD  Active   sitaGLIPtin (JANUVIA) 100 MG tablet [id number] No Take 100 mg by mouth daily. [provider] Taking Active Self  tiZANidine (ZANAFLEX) 4 MG tablet [id number]  TAKE 1 TABLET (4 MG TOTAL) BY MOUTH EVERY 8 (EIGHT) HOURS AS NEEDED FOR MUSCLE SPASMS Sheliah Hatch, MD  Active               Assessment/Plan:   Medication Access:  - Reviewed BI Cares application for Jardiance that was scanned to Mrs. Arroyo's chart. There were a few sections that needed to be completed. Completed and faxed to [facility].  - Checking with PCP office about Januvia / [employer] application - this one has be to mailed to medication assistance program. If unable to find then patient will come in Thursday 12/12 to redo application and sign.    Henrene Pastor, PharmD Clinical  Pharmacist [facility]  Population Health [phone]

## 2023-07-25 NOTE — Progress Notes (Signed)
07/25/2023 Unable to find [employer] application or copy at [employer]. Restarted application. Patient notified that we will need her to come by to sign new application. Patient will come by tomorrow 07/26/23. Forwarding to PCP to review and sign today.  Henrene Pastor, PharmD Clinical Pharmacist [facility]  Population Health [phone]

## 2023-07-26 ENCOUNTER — Encounter (HOSPITAL_BASED_OUTPATIENT_CLINIC_OR_DEPARTMENT_OTHER): Payer: Self-pay | Admitting: Cardiology

## 2023-07-26 ENCOUNTER — Ambulatory Visit (HOSPITAL_BASED_OUTPATIENT_CLINIC_OR_DEPARTMENT_OTHER): Payer: PPO | Admitting: Cardiology

## 2023-07-26 VITALS — BP 135/71 | HR 67 | Ht 64.0 in | Wt 185.0 lb

## 2023-07-26 DIAGNOSIS — E782 Mixed hyperlipidemia: Secondary | ICD-10-CM

## 2023-07-26 DIAGNOSIS — I350 Nonrheumatic aortic (valve) stenosis: Secondary | ICD-10-CM

## 2023-07-26 DIAGNOSIS — I10 Essential (primary) hypertension: Secondary | ICD-10-CM | POA: Diagnosis not present

## 2023-07-26 DIAGNOSIS — Z952 Presence of prosthetic heart valve: Secondary | ICD-10-CM

## 2023-07-26 DIAGNOSIS — E1140 Type 2 diabetes mellitus with diabetic neuropathy, unspecified: Secondary | ICD-10-CM | POA: Diagnosis not present

## 2023-07-26 NOTE — Patient Instructions (Signed)
Medication Instructions:  Continue same medications *If you need a refill on your cardiac medications before your next appointment, please call your pharmacy*   Lab Work: None ordered   Testing/Procedures: None ordered   Follow-Up: At [facility], you and your health needs are our priority.  As part of our continuing mission to provide you with exceptional heart care, we have created designated Provider Care Teams.  These Care Teams include your primary Cardiologist (physician) and Advanced Practice Providers (APPs -  Physician Assistants and Nurse Practitioners) who all work together to provide you with the care you need, when you need it.  We recommend signing up for the patient portal called "MyChart".  Sign up information is provided on this After Visit Summary.  MyChart is used to connect with patients for Virtual Visits (Telemedicine).  Patients are able to view lab/test results, encounter notes, upcoming appointments, etc.  Non-urgent messages can be sent to your provider as well.   To learn more about what you can do with MyChart, go to [email or web address].    Your next appointment:  6 months    Call in April to schedule June appointment     Provider:  Dr.Christopher

## 2023-07-26 NOTE — Progress Notes (Signed)
Cardiology Office Note:  .   Date:  07/26/2023  ID:  Karen Gonzales, DOB 11/13/1945, MRN [id number] PCP: Sheliah Hatch, MD  [facility] Providers Cardiologist:  Jodelle Red, MD {  History of Present Illness: .   Karen Gonzales is a 77 y.o. female with a hx of AS s/p TAVR 08/2022, hypertension, hyperlipidemia, diabetes, DVT, GERD, hyponatremia (with diuretic use), fatty liver disease (nonalcoholic), anti-cardiolipin antibody syndrome, arthritis, and COVID-19, who is seen for follow up today. I initially met her 07/17/21 as a new consult at the request of Sheliah Hatch, MD for the evaluation and management of aortic valve stenosis.   Pertinent CV history: Severe AS s/p TAVR 12024. [facility] 07/2022 with no CAD.  Today: Still having vertigo, went to PT for this but still has severe episodes intermittently. Meclizine helps some but can last all day.   Sometimes has occasional shortness of breath/focal chest tightness after bending over. Lasts only a short time, goes away on its own.  ROS: Denies shortness of breath at rest or with normal exertion. No PND, orthopnea, LE edema or unexpected weight gain. No syncope or palpitations. ROS otherwise negative except as noted.   Studies Reviewed: Marland Kitchen    EKG:  EKG Interpretation Date/Time:  Friday July 26 2023 09:59:51 EST Ventricular Rate:  67 PR Interval:  152 QRS Duration:  86 QT Interval:  384 QTC Calculation: 405 R Axis:   -16  Text Interpretation: Normal sinus rhythm Possible Left atrial enlargement Minimal voltage criteria for LVH, may be normal variant Confirmed by Jodelle Red [phone]) on 07/26/2023 10:34:12 AM    Physical Exam:   VS:  BP 135/71 (BP Location: Left Arm, Patient Position: Sitting, Cuff Size: Normal)   Pulse 67   Ht 5\' 4"  (1.626 m)   Wt 185 lb (83.9 kg)   SpO2 96%   BMI 31.76 kg/m    Wt Readings from Last 3 Encounters:  07/26/23 185 lb (83.9 kg)  06/21/23 186 lb 3.2 oz (84.5  kg)  03/20/23 187 lb 2 oz (84.9 kg)    GEN: Well nourished, well developed in no acute distress HEENT: Normal, moist mucous membranes NECK: No JVD CARDIAC: regular rhythm, normal S1 and S2, no rubs or gallops. 1/6 systolic murmur. VASCULAR: Radial and DP pulses 2+ bilaterally. No carotid bruits RESPIRATORY:  Clear to auscultation without rales, wheezing or rhonchi  ABDOMEN: Soft, non-tender, non-distended MUSCULOSKELETAL:  Ambulates independently SKIN: Warm and dry, no edema NEUROLOGIC:  Alert and oriented x 3. No focal neuro deficits noted. PSYCHIATRIC:  Normal affect    ASSESSMENT AND PLAN: .    Aortic stenosis -s/p TAVR 08/2022 -on aspirin -has dental prophylaxis -doing very well   Hypertension: -near goal today, always slightly higher in the office -continue amlodipine, losartan   Type II diabetes Mixed hyperlipidemia Coronary calcification without CAD on cath -on empagliflozin, glipizide, metformin, sitagliptin -continue rosuvastatin 10 mg, LDL 47 -continue aspirin 81 mg daily  CV risk counseling and prevention -recommend heart healthy/Mediterranean diet, with whole grains, fruits, vegetable, fish, lean meats, nuts, and olive oil. Limit salt. -recommend moderate walking, 3-5 times/week for 30-50 minutes each session. Aim for at least 150 minutes.week. Goal should be pace of 3 miles/hours, or walking 1.5 miles in 30 minutes -recommend avoidance of tobacco products. Avoid excess alcohol.  Dispo: 6 mos or sooner as needed  Signed, Jodelle Red, MD   Jodelle Red, MD, PhD, [facility]  [facility]  Heart & Vascular at [facility] at [facility] [address] [place], [place] [address] [phone]

## 2023-07-31 ENCOUNTER — Other Ambulatory Visit: Payer: Self-pay | Admitting: Family Medicine

## 2023-08-01 DIAGNOSIS — M545 Low back pain, unspecified: Secondary | ICD-10-CM | POA: Diagnosis not present

## 2023-08-02 ENCOUNTER — Other Ambulatory Visit: Payer: Self-pay | Admitting: Family Medicine

## 2023-08-03 ENCOUNTER — Ambulatory Visit
Admission: EM | Admit: 2023-08-03 | Discharge: 2023-08-03 | Disposition: A | Discharge status: Home / Self Care | Payer: PPO | Attending: Nurse Practitioner | Admitting: Nurse Practitioner

## 2023-08-03 DIAGNOSIS — N3000 Acute cystitis without hematuria: Secondary | ICD-10-CM | POA: Diagnosis not present

## 2023-08-03 LAB — POCT URINALYSIS DIP (MANUAL ENTRY)
Bilirubin, UA: NEGATIVE
Glucose, UA: >=1000 mg/dL — AB
Ketones, POC UA: NEGATIVE mg/dL
Nitrite, UA: POSITIVE — AB
Protein Ur, POC: NEGATIVE mg/dL
Spec Grav, UA: 1.01 (ref 1.010–1.025)
Urobilinogen, UA: 0.2 U/dL
pH, UA: 5.5 (ref 5.0–8.0)

## 2023-08-03 MED ORDER — CEPHALEXIN 500 MG PO CAPS: 500.0000 mg | ORAL_CAPSULE | Freq: Four times a day (QID) | ORAL | 0 refills | Status: AC

## 2023-08-03 NOTE — ED Provider Notes (Signed)
[facility]    CSN: [id number] Arrival date & time: 08/03/23  1043      History   Chief Complaint No chief complaint on file.   HPI Karen Gonzales is a 77 y.o. female.   The history is provided by the patient.   Patient presents for complaints of pain with urination, suprapubic pressure, and burning on urination that started over the past 24 hours.  Patient denies fever, chills, chest pain, abdominal pain, nausea, vomiting, diarrhea, urinary frequency, urgency, hesitancy, decreased urine stream, hematuria, or flank pain.  Reports prior history of recurrent UTIs.  Patient reports that she does take Jardiance for diabetes.  Past Medical History:  Diagnosis Date   Anti-cardiolipin antibody syndrome (HCC) 06/25/2011   Arthritis    Bronchitis    history of   COVID-19    Diabetes mellitus    DVT (deep venous thrombosis) (HCC) 06/25/2011   Fatty liver disease, nonalcoholic    determinde by Ultrasound 11/11   GERD (gastroesophageal reflux disease)    H/O vitamin D deficiency    Hiatal hernia    Hyperlipidemia    Hypertension    Hyponatremia    with diuretic use   Osteopenia    PONV (postoperative nausea and vomiting)    S/P TAVR (transcatheter aortic valve replacement) 08/28/2022   s/p TAVR with a 23 mm Edwards via the TF approach by Dr. Excell Seltzer & Dr. Delia Chimes   Vertigo     Patient Active Problem List   Diagnosis Date Noted   Vertigo 02/22/2023   S/P TAVR (transcatheter aortic valve replacement) 08/28/2022   Nonrheumatic aortic valve stenosis 07/19/2022   Fatty liver 10/21/2020   Gastro-esophageal reflux disease without esophagitis 10/21/2020   Iron deficiency anemia 10/21/2020   Chronic venous insufficiency 10/09/2019   Obesity (BMI 30-39.9) 08/31/2019   Physical exam 10/10/2016   Gastroparesis due to DM (HCC) 06/05/2016   Type 2 diabetes mellitus with diabetic neuropathy, without long-term current use of insulin (HCC) 11/07/2015   Hyperlipidemia  associated with type 2 diabetes mellitus (HCC) 11/07/2015   Fibromyalgia 06/25/2011   Anti-cardiolipin antibody syndrome (HCC) 06/25/2011   Essential hypertension 09/24/2007    Past Surgical History:  Procedure Laterality Date   ABDOMINAL HYSTERECTOMY     ACHILLES TENDON SURGERY     bilateral    CARPAL TUNNEL RELEASE     bilateral    CHOLECYSTECTOMY  1969   open   CRANIOTOMY  07/09/2011   Procedure: CRANIOTOMY HEMATOMA EVACUATION SUBDURAL;  Surgeon: Kathaleen Maser Pool;  Location: [facility];  Service: Neurosurgery;  Laterality: Right;  Excision of Skull Lesion-Right Right Parietal Crani w/ Resection of Skull Tumor (1 1/2 hours - to follow)   DILATION AND CURETTAGE OF UTERUS     INTRAOPERATIVE TRANSTHORACIC ECHOCARDIOGRAM N/A 08/28/2022   Procedure: INTRAOPERATIVE TRANSTHORACIC ECHOCARDIOGRAM;  Surgeon: Tonny Bollman, MD;  Location: [facility] OR;  Service: Open Heart Surgery;  Laterality: N/A;   JOINT REPLACEMENT  2004   left knee replacement    KNEE SURGERY     total of 6 surgeries on both knees    REPAIR PERONEAL TENDONS ANKLE     RIGHT/LEFT HEART CATH AND CORONARY ANGIOGRAPHY N/A 07/19/2022   Procedure: RIGHT/LEFT HEART CATH AND CORONARY ANGIOGRAPHY;  Surgeon: Kathleene Hazel, MD;  Location: [facility];  Service: Cardiovascular;  Laterality: N/A;   TRANSCATHETER AORTIC VALVE REPLACEMENT, TRANSFEMORAL N/A 08/28/2022   Procedure: Transcatheter Aortic Valve Replacement, Transfemoral Approach using Edwards 23 MM SAPIEN 3 Ultra;  Surgeon: Tonny Bollman, MD;  Location: [facility];  Service: Open Heart Surgery;  Laterality: N/A;  Transfemoral   TUBAL LIGATION     TUMOR REMOVAL     on pelvic bone     OB History   No obstetric history on file.      Home Medications    Prior to Admission medications   Medication Sig Start Date End Date Taking? Authorizing Provider  cephALEXin (KEFLEX) 500 MG capsule Take 1 capsule (500 mg total) by mouth 4 (four) times daily for 7 days. 08/03/23  08/10/23 Yes Leath-Warren, Sadie Haber, NP  amLODipine (NORVASC) 10 MG tablet Take 1 tablet (10 mg total) by mouth daily. 02/22/23   Sheliah Hatch, MD  amoxicillin (AMOXIL) 500 MG tablet Take 4 tablets (2,000 mg total) by mouth as directed. 1 hour prior to dental work including cleanings 09/05/22   Janetta Hora, PA-C  aspirin EC 81 MG tablet Take 1 tablet (81 mg total) by mouth daily. Swallow whole. 12/25/21   Jodelle Red, MD  Blood Glucose Monitoring Suppl (ONE TOUCH ULTRA 2) w/Device KIT Pt uses glucometer to check sugars twice daily. Dx E11.9 02/03/16   Sheliah Hatch, MD  empagliflozin (JARDIANCE) 25 MG TABS tablet Take 1 tablet (25 mg total) by mouth daily. 02/22/23   Sheliah Hatch, MD  gabapentin (NEURONTIN) 300 MG capsule Take 2 capsules (600 mg total) by mouth 3 (three) times daily. 02/22/23   Sheliah Hatch, MD  glipiZIDE (GLUCOTROL XL) 10 MG 24 hr tablet TAKE 1 TABLET (10 MG TOTAL) BY MOUTH DAILY WITH BREAKFAST. 08/02/23   Sheliah Hatch, MD  Homeopathic Products (RESTFUL LEGS SL) Place 2 tablets under the tongue daily as needed (restless legs).    [provider]  Lancets [place] DELICA PLUS LANCET33G) MISC USE WHEN CHECKING BLOOD SUGAR AS DIRECTED 04/22/23   Sheliah Hatch, MD  losartan (COZAAR) 50 MG tablet Take 2 tablets (100 mg total) by mouth daily. 02/22/23   Sheliah Hatch, MD  meclizine (ANTIVERT) 25 MG tablet Take 1 tablet (25 mg total) by mouth 3 (three) times daily as needed for dizziness. 09/14/22   Sheliah Hatch, MD  meloxicam (MOBIC) 15 MG tablet TAKE 1 TABLET BY MOUTH EVERY DAY WITH A MEAL 07/15/23   Sheliah Hatch, MD  Menthol-Methyl Salicylate (MUSCLE RUB) 10-15 % CREA Apply 1 Application topically as needed for muscle pain.    [provider]  metFORMIN (GLUCOPHAGE) 500 MG tablet TAKE 2 TABLETS BY MOUTH 2 TIMES DAILY WITH A MEAL. 05/31/23   Sheliah Hatch, MD  omeprazole (PRILOSEC) 40 MG capsule  Take 1 capsule (40 mg total) by mouth 2 (two) times daily. Patient taking differently: Take 40 mg by mouth daily. 06/05/16   Sheliah Hatch, MD  ONETOUCH ULTRA test strip CHECK BLOOD SUGAR 1-2 TIMES DAILY 08/06/22   Sheliah Hatch, MD  oxymetazoline (AFRIN) 0.05 % nasal spray Place 1 spray into both nostrils 2 (two) times daily as needed for congestion.    [provider]  rosuvastatin (CRESTOR) 10 MG tablet TAKE 1 TABLET BY MOUTH EVERY DAY 05/23/23   Jodelle Red, MD  sitaGLIPtin (JANUVIA) 100 MG tablet Take 100 mg by mouth daily. 01/27/21   [provider]  tiZANidine (ZANAFLEX) 4 MG tablet TAKE 1 TABLET (4 MG TOTAL) BY MOUTH EVERY 8 (EIGHT) HOURS AS NEEDED FOR MUSCLE SPASMS 06/24/23   Sheliah Hatch, MD    Family History Family History  Problem Relation Age of Onset   Kidney disease Mother    Osteoporosis Mother    Heart disease Father    Cancer Father        lymphoma   Osteoporosis Father    Healthy Brother    Cancer Maternal Grandmother        lymphoma   Breast cancer Maternal Grandmother    Cancer Paternal Grandmother        breast   Breast cancer Paternal Aunt    Hodgkin's lymphoma Grandchild     Social History Social History   Tobacco Use   Smoking status: Never   Smokeless tobacco: Never  Vaping Use   Vaping status: Never Used  Substance Use Topics   Alcohol use: No   Drug use: No     Allergies   Reglan [metoclopramide] and Estrogens   Review of Systems Review of Systems Per HPI  Physical Exam Triage Vital Signs ED Triage Vitals  Encounter Vitals Group     BP 08/03/23 1051 (!) 163/75     Systolic BP Percentile --      Diastolic BP Percentile --      Pulse Rate 08/03/23 1051 73     Resp 08/03/23 1051 16     Temp 08/03/23 1051 98.1 F (36.7 C)     Temp src --      SpO2 08/03/23 1051 93 %     Weight --      Height --      Head Circumference --      Peak Flow --      Pain Score 08/03/23 1053 6     Pain  Loc --      Pain Education --      Exclude from Growth Chart --    No data found.  Updated Vital Signs BP (!) 163/75 (BP Location: Right Arm)   Pulse 73   Temp 98.1 F (36.7 C)   Resp 16   SpO2 93%   Visual Acuity Right Eye Distance:   Left Eye Distance:   Bilateral Distance:    Right Eye Near:   Left Eye Near:    Bilateral Near:     Physical Exam Vitals and nursing note reviewed.  Constitutional:      General: She is not in acute distress.    Appearance: Normal appearance.  HENT:     Head: Normocephalic.  Eyes:     Extraocular Movements: Extraocular movements intact.     Pupils: Pupils are equal, round, and reactive to light.  Cardiovascular:     Rate and Rhythm: Normal rate and regular rhythm.     Pulses: Normal pulses.     Heart sounds: Normal heart sounds.  Pulmonary:     Effort: Pulmonary effort is normal. No respiratory distress.     Breath sounds: Normal breath sounds. No stridor. No wheezing, rhonchi or rales.  Abdominal:     General: Bowel sounds are normal.     Palpations: Abdomen is soft.     Tenderness: There is no abdominal tenderness.  Musculoskeletal:     Cervical back: Normal range of motion.  Lymphadenopathy:     Cervical: No cervical adenopathy.  Skin:    General: Skin is warm and dry.  Neurological:     General: No focal deficit present.     Mental Status: She is alert and oriented to person, place, and time.  Psychiatric:        Mood and Affect: Mood normal.  Behavior: Behavior normal.      UC Treatments / Results  Labs (all labs ordered are listed, but only abnormal results are displayed) Labs Reviewed  POCT URINALYSIS DIP (MANUAL ENTRY) - Abnormal; Notable for the following components:      Result Value   Clarity, UA hazy (*)    Glucose, UA >=1,000 (*)    Blood, UA moderate (*)    Nitrite, UA Positive (*)    Leukocytes, UA Trace (*)    All other components within normal limits    EKG   Radiology No results  found.  Procedures Procedures (including critical care time)  Medications Ordered in UC Medications - No data to display  Initial Impression / Assessment and Plan / UC Course  I have reviewed the triage vital signs and the nursing notes.  Pertinent labs & imaging results that were available during my care of the patient were reviewed by me and considered in my medical decision making (see chart for details).  Urinalysis is consistent with a urinary tract infection in the presence of nitrates, leukocytes, and blood.  Urine culture is pending.  Will treat empirically with Keflex 500 mg for the next 7 days.  Supportive care recommendations were provided and discussed with the patient to include fluids, over-the-counter analgesics, voiding at least every 2 hours, and avoiding caffeine.  Discussed indications with the patient regarding when follow-up in the emergency department will be indicated.  Patient was in agreement with this plan of care and verbalized understanding.  All questions were answered.  Patient stable for discharge.  Final Clinical Impressions(s) / UC Diagnoses   Final diagnoses:  Acute cystitis without hematuria     Discharge Instructions      -Your urinalysis shows you do have a urinary tract infection.  Urine culture has been ordered to ensure you are being treated with the appropriate antibiotic.  If the medication needs to be changed, you will be contacted. -Take medications as prescribed. -Increase fluids. -Ibuprofen or Tylenol for pain, fever, or general discomfort. -Develop a toileting schedule that will allow you to urinate at least every 2 hours. -Avoid caffeine to include tea, soda, and coffee. -If sexually active, void at least 15 to 20 minutes after sexual intercourse. -Follow-up in the emergency department if you develop fever, chills, worsening abdominal pain, worsening or urinary symptoms, or other concerns. -Follow-up as needed.     ED Prescriptions      Medication Sig Dispense Auth. Provider   cephALEXin (KEFLEX) 500 MG capsule Take 1 capsule (500 mg total) by mouth 4 (four) times daily for 7 days. 28 capsule Leath-Warren, Sadie Haber, NP      PDMP not reviewed this encounter.   Abran Cantor, NP 08/03/23 1112

## 2023-08-03 NOTE — Discharge Instructions (Addendum)
-  Your urinalysis shows you do have a urinary tract infection.  Urine culture has been ordered to ensure you are being treated with the appropriate antibiotic.  If the medication needs to be changed, you will be contacted. -Take medications as prescribed. -Increase fluids. -Ibuprofen or Tylenol for pain, fever, or general discomfort. -Develop a toileting schedule that will allow you to urinate at least every 2 hours. -Avoid caffeine to include tea, soda, and coffee. -If sexually active, void at least 15 to 20 minutes after sexual intercourse. -Follow-up in the emergency department if you develop fever, chills, worsening abdominal pain, worsening or urinary symptoms, or other concerns. -Follow-up as needed.

## 2023-08-03 NOTE — ED Triage Notes (Signed)
Pt reports she has burning with urination, lower abdominal pain, and painful urination x 1 day    Took azo this morning

## 2023-08-04 LAB — URINE CULTURE: Culture: <10000 — AB

## 2023-08-07 ENCOUNTER — Other Ambulatory Visit: Payer: Self-pay | Admitting: Family Medicine

## 2023-08-15 ENCOUNTER — Other Ambulatory Visit: Payer: Self-pay | Admitting: Family Medicine

## 2023-08-15 NOTE — Telephone Encounter (Signed)
 Requested Prescriptions   Pending Prescriptions Disp Refills   meloxicam  (MOBIC ) 15 MG tablet [Pharmacy Med Name: MELOXICAM  15 MG TABLET] 30 tablet 0    Sig: TAKE 1 TABLET BY MOUTH EVERY DAY WITH A MEAL     Date of patient request: 08/15/2023 Last office visit: 06/21/2023 Upcoming visit: 09/23/2023 Date of last refill: 07/15/2023 Last refill amount: 30

## 2023-08-20 NOTE — Progress Notes (Signed)
 [facility]   MULTIDISCIPLINARY HEART VALVE TEAM  Structural Heart Office Note:  .    Date:  08/21/2023  ID:  Karen Gonzales, DOB Aug 28, 1945, MRN [id number] PCP: Mahlon Comer BRAVO, MD  [facility] Providers Cardiologist:  Shelda Bruckner, MD  History of Present Illness: .   Karen Gonzales is a 78 y.o. female with a hx of obesity (BMI 29), HTN, HLD, DMT2, nonalcoholic steatohepatitis, anticardiolipin antibody syndrome, and progressive paradoxical LFLG aortic stenosis s/p TAVR (08/29/22) who presents for one year TAVR follow up.    Karen Gonzales was found to have moderate aortic stenosis in early 2023. On  her 6 month follow up echo 06/29/22 showed EF 65%, and severe progression to severe AS with mean grad 33 mmHg, peak grad 58 mmHg, AVA 0.7 cm2, DVI 0.24, SVI 33. She complained of progressive DOE. [facility] 07/19/22 showed no angiographic evidence of CAD and severe aortic stenosis (mean gradient 45 mmHg, peak to peak gradient 48 mmHg, AVA 0.78 cm2). She was then evaluated by the multidisciplinary valve team and underwent successful TAVR with a 23 mm Edwards Sapien 3 Ultra Resilia THV via the TF approach on 08/28/22. Post operative echo showed EF 60%, normally functioning TAVR with a mean gradient of 12 mmHg and no PVL. She was continued on ASA 81mg .   Today she is here with her husband and reports that she has been well with no chest pain, SOB, palpitations, orthopnea, PND, dizziness, or syncope. Denies bleeding in stool or urine. Occasionally will have trace dependent edema that resolves by morning. Her vertigo is improved dramatically. Tolerating medications well.    Physical Exam:   VS:  BP (!) 142/64 (BP Location: Left Arm, Patient Position: Sitting, Cuff Size: Normal)   Pulse 76   Ht 5' 5 (1.651 m)   Wt 186 lb (84.4 kg)   SpO2 97%   BMI 30.95 kg/m    Wt Readings from Last 3 Encounters:  08/21/23 186 lb (84.4 kg)  07/26/23 185 lb (83.9 kg)  06/21/23 186 lb  3.2 oz (84.5 kg)    General: Well developed, well nourished, NAD Lungs:Clear to ausculation bilaterally. No wheezes, rales, or rhonchi. Breathing is unlabored. Cardiovascular: RRR with S1 S2. No murmurs Extremities: Trace bilateral LE edema.  Neuro: Alert and oriented. No focal deficits. No facial asymmetry. MAE spontaneously. Psych: Responds to questions appropriately with normal affect.    ASSESSMENT AND PLAN: .    Severe AS s/p TAVR: Patient doing well with NYHA class I symptoms s/p TAVR. Echocardiogram today with normal LVEF at 65-70% with stable 23mm S3UR TAVR valve with a mean gradient at , AVA 1.5cm2, and DI 0.53. Continue ASA and lifelong dental SBE. Plan regular follow up with Dr. Bruckner 01/2024.    HTN: Elevated above baseline today however reports better control with home readings. No changes needed today.    Pulmonary nodules: pre TAVR CT showed small solid pulmonary nodules, largest is a 3 mm nodule of the right middle lobe. No follow-up needed if patient is low-risk (and has no known or suspected primary neoplasm). Non-contrast chest CT can be considered in 12 months if patient is high-risk. Previously discussed with patient and low risk therefore no follow up necessary.    Dizziness: Improved. Continues to follow with PCP for this and uses PRN  meclizine .    Dispo: Home   I spent 25 minutes caring for this patient today including face-to-face discussions, ordering and reviewing labs, reviewing  records from [facility] and other outside facilities, documenting in the record, and arranging for follow up.    Signed, Kate Minus, NP

## 2023-08-21 ENCOUNTER — Ambulatory Visit: Payer: PPO | Attending: Cardiology

## 2023-08-21 ENCOUNTER — Ambulatory Visit: Payer: PPO | Admitting: Cardiology

## 2023-08-21 VITALS — BP 142/64 | HR 76 | Ht 65.0 in | Wt 186.0 lb

## 2023-08-21 DIAGNOSIS — Z952 Presence of prosthetic heart valve: Secondary | ICD-10-CM

## 2023-08-21 DIAGNOSIS — R911 Solitary pulmonary nodule: Secondary | ICD-10-CM

## 2023-08-21 DIAGNOSIS — I350 Nonrheumatic aortic (valve) stenosis: Secondary | ICD-10-CM

## 2023-08-21 DIAGNOSIS — I10 Essential (primary) hypertension: Secondary | ICD-10-CM | POA: Insufficient documentation

## 2023-08-21 LAB — ECHOCARDIOGRAM COMPLETE
AR max vel: 1.38 cm2
AV Area VTI: 1.48 cm2
AV Area mean vel: 1.39 cm2
AV Mean grad: 13 mm[Hg]
AV Peak grad: 23.6 mm[Hg]
Ao pk vel: 2.43 m/s
Area-P 1/2: 3.68 cm2
S' Lateral: 2.3 cm

## 2023-08-21 NOTE — Patient Instructions (Signed)
Medication Instructions:  Your physician recommends that you continue on your current medications as directed. Please refer to the Current Medication list given to you today.  *If you need a refill on your cardiac medications before your next appointment, please call your pharmacy*   Lab Work: None ordered   If you have labs (blood work) drawn today and your tests are completely normal, you will receive your results only by: [place] (if you have MyChart) OR A paper copy in the mail If you have any lab test that is abnormal or we need to change your treatment, we will call you to review the results.   Testing/Procedures: None ordered    Follow-Up: Follow up as scheduled   Other Instructions

## 2023-08-26 DIAGNOSIS — M5416 Radiculopathy, lumbar region: Secondary | ICD-10-CM | POA: Diagnosis not present

## 2023-08-29 ENCOUNTER — Other Ambulatory Visit: Payer: Self-pay | Admitting: Family Medicine

## 2023-09-04 ENCOUNTER — Other Ambulatory Visit: Payer: Self-pay | Admitting: Family Medicine

## 2023-09-04 DIAGNOSIS — I10 Essential (primary) hypertension: Secondary | ICD-10-CM

## 2023-09-09 DIAGNOSIS — M62830 Muscle spasm of back: Secondary | ICD-10-CM | POA: Diagnosis not present

## 2023-09-09 DIAGNOSIS — M5416 Radiculopathy, lumbar region: Secondary | ICD-10-CM | POA: Diagnosis not present

## 2023-09-09 DIAGNOSIS — M6281 Muscle weakness (generalized): Secondary | ICD-10-CM | POA: Diagnosis not present

## 2023-09-10 ENCOUNTER — Encounter: Payer: Self-pay | Admitting: Pharmacist

## 2023-09-10 NOTE — Progress Notes (Signed)
   09/10/2023 Name: Karen Gonzales MRN: [id number] DOB: 1946/02/23  Chief Complaint  Patient presents with   Medication Management    Karen Gonzales is a 78 y.o. year old female who presented for a telephone visit.   They were referred to the pharmacist by  for assistance in re-enrollment for 2025 medication assistance program.       Subjective:  Care Team: Primary Care Provider: Sheliah Hatch, MD ; Next Scheduled Visit: 09/23/2023 Cardiologist: Dr Cristal Deer; Next Scheduled Visit: 01/20/2024  Medication Access/Adherence  Patient was enrolled in [facility] medication assistance program in 2024 and received Jardiance.  She also was enrolled in [employer] medication assistance program in 2024 and received Januvia.  She has completed applications for both medication assistance program    Current Pharmacy:  [facility] [phone] - [place],  - [address] [place] [address] Phone: [phone] Fax: [phone]  [facility] - [place], [place] - [address] [place] [address] Phone: [phone] Fax: [phone]   Patient reports affordability concerns with their medications: Yes  Patient reports access/transportation concerns to their pharmacy: No  Patient reports adherence concerns with their medications:  No      Objective:  Lab Results  Component Value Date   HGBA1C 7.7 (H) 06/21/2023    Lab Results  Component Value Date   CREATININE 0.89 06/28/2023   BUN 26 (H) 06/28/2023   NA 136 06/28/2023   K 4.5 06/28/2023   CL 101 06/28/2023   CO2 28 06/28/2023    Lab Results  Component Value Date   CHOL 129 06/21/2023   HDL 46.90 06/21/2023   LDLCALC 47 06/21/2023   LDLDIRECT 104.0 04/08/2017   TRIG 177.0 (H) 06/21/2023   CHOLHDL 3 06/21/2023    Medications Reviewed Today   Medications were not reviewed in this encounter        Assessment/Plan:   Medication Access:  - Called [employer] and verified patient was approved for Jardiance thru 08/12/2024. Patient verified she has received delivery for Jardiance - Also verified patient has been approved for Januvia medication assistance program and has received delivery in 2025.   F/U planned in October 2025 to restart 2026 medication assistance program applications if needed.    Henrene Pastor, PharmD Clinical Pharmacist [facility]  Population Health [phone]

## 2023-09-13 DIAGNOSIS — M5416 Radiculopathy, lumbar region: Secondary | ICD-10-CM | POA: Diagnosis not present

## 2023-09-13 DIAGNOSIS — M62830 Muscle spasm of back: Secondary | ICD-10-CM | POA: Diagnosis not present

## 2023-09-13 DIAGNOSIS — M6281 Muscle weakness (generalized): Secondary | ICD-10-CM | POA: Diagnosis not present

## 2023-09-16 DIAGNOSIS — M6281 Muscle weakness (generalized): Secondary | ICD-10-CM | POA: Diagnosis not present

## 2023-09-16 DIAGNOSIS — M62830 Muscle spasm of back: Secondary | ICD-10-CM | POA: Diagnosis not present

## 2023-09-16 DIAGNOSIS — M5416 Radiculopathy, lumbar region: Secondary | ICD-10-CM | POA: Diagnosis not present

## 2023-09-20 DIAGNOSIS — M5416 Radiculopathy, lumbar region: Secondary | ICD-10-CM | POA: Diagnosis not present

## 2023-09-20 DIAGNOSIS — M62830 Muscle spasm of back: Secondary | ICD-10-CM | POA: Diagnosis not present

## 2023-09-20 DIAGNOSIS — M6281 Muscle weakness (generalized): Secondary | ICD-10-CM | POA: Diagnosis not present

## 2023-09-23 ENCOUNTER — Ambulatory Visit: Payer: PPO | Admitting: Family Medicine

## 2023-09-23 ENCOUNTER — Encounter: Payer: Self-pay | Admitting: Family Medicine

## 2023-09-23 DIAGNOSIS — E1140 Type 2 diabetes mellitus with diabetic neuropathy, unspecified: Secondary | ICD-10-CM | POA: Diagnosis not present

## 2023-09-23 DIAGNOSIS — Z7984 Long term (current) use of oral hypoglycemic drugs: Secondary | ICD-10-CM | POA: Diagnosis not present

## 2023-09-23 LAB — BASIC METABOLIC PANEL
BUN: 21 mg/dL (ref 6–23)
CO2: 25 meq/L (ref 19–32)
Calcium: 9.7 mg/dL (ref 8.4–10.5)
Chloride: 100 meq/L (ref 96–112)
Creatinine, Ser: 0.86 mg/dL (ref 0.40–1.20)
GFR: 65.67 mL/min (ref >60.00)
Glucose, Bld: 111 mg/dL — ABNORMAL HIGH (ref 70–99)
Potassium: 5 meq/L (ref 3.5–5.1)
Sodium: 135 meq/L (ref 135–145)

## 2023-09-23 LAB — HEMOGLOBIN A1C: Hgb A1c MFr Bld: 7.8 % — ABNORMAL HIGH (ref 4.6–6.5)

## 2023-09-23 NOTE — Assessment & Plan Note (Signed)
 Chronic problem.  On Januvia , Metformin , Glipizide , Jardiance  w/o difficulty.  Last A1C 7.7%  UTD on eye exam, foot exam, microalbumin.  Check labs.  Adjust meds prn

## 2023-09-23 NOTE — Patient Instructions (Signed)
 Schedule your complete physical in 3-4 months We'll notify you of your lab results and make any changes if needed Keep up the good work on healthy diet and regular exercise- you look great! Call with any questions or concerns Stay Safe!  Stay Healthy! Happy Valentine's Day!!

## 2023-09-23 NOTE — Progress Notes (Signed)
   Subjective:    Patient ID: Karen Gonzales, female    DOB: 29-Jul-1946, 78 y.o.   MRN: [id number]  HPI DM- chronic problem.  UTD on foot exam, eye exam, microalbumin.  On Januvia  100mg  daily, Metformin  1000mg  BID, Glipizide  10mg  daily, Jardiance  25mg  daily.  Last A1C 7.7%.  Pt reports 'feeling fine'.  No CP, SOB, HA's, visual changes, abd pain, N/V.  Denies symptomatic lows.  No numbness/tingling of hands/feet.  Pt reports sugars were stable in December but seem to be climbing in January.     Review of Systems For ROS see HPI     Objective:   Physical Exam Vitals reviewed.  Constitutional:      General: She is not in acute distress.    Appearance: Normal appearance. She is well-developed. She is not ill-appearing.  HENT:     Head: Normocephalic and atraumatic.  Eyes:     Conjunctiva/sclera: Conjunctivae normal.     Pupils: Pupils are equal, round, and reactive to light.  Neck:     Thyroid : No thyromegaly.  Cardiovascular:     Rate and Rhythm: Normal rate and regular rhythm.     Heart sounds: Normal heart sounds. No murmur heard. Pulmonary:     Effort: Pulmonary effort is normal. No respiratory distress.     Breath sounds: Normal breath sounds.  Abdominal:     General: There is no distension.     Palpations: Abdomen is soft.     Tenderness: There is no abdominal tenderness.  Musculoskeletal:     Cervical back: Normal range of motion and neck supple.  Lymphadenopathy:     Cervical: No cervical adenopathy.  Skin:    General: Skin is warm and dry.  Neurological:     General: No focal deficit present.     Mental Status: She is alert and oriented to person, place, and time.  Psychiatric:        Mood and Affect: Mood normal.        Behavior: Behavior normal.        Thought Content: Thought content normal.           Assessment & Plan:

## 2023-09-24 ENCOUNTER — Encounter: Payer: Self-pay | Admitting: Family Medicine

## 2023-09-27 DIAGNOSIS — M62830 Muscle spasm of back: Secondary | ICD-10-CM | POA: Diagnosis not present

## 2023-09-27 DIAGNOSIS — M5416 Radiculopathy, lumbar region: Secondary | ICD-10-CM | POA: Diagnosis not present

## 2023-09-27 DIAGNOSIS — M6281 Muscle weakness (generalized): Secondary | ICD-10-CM | POA: Diagnosis not present

## 2023-09-30 ENCOUNTER — Other Ambulatory Visit: Payer: Self-pay | Admitting: Family Medicine

## 2023-10-04 DIAGNOSIS — M62830 Muscle spasm of back: Secondary | ICD-10-CM | POA: Diagnosis not present

## 2023-10-04 DIAGNOSIS — M5416 Radiculopathy, lumbar region: Secondary | ICD-10-CM | POA: Diagnosis not present

## 2023-10-04 DIAGNOSIS — M6281 Muscle weakness (generalized): Secondary | ICD-10-CM | POA: Diagnosis not present

## 2023-10-08 ENCOUNTER — Encounter: Payer: Self-pay | Admitting: Family Medicine

## 2023-10-08 ENCOUNTER — Ambulatory Visit (INDEPENDENT_AMBULATORY_CARE_PROVIDER_SITE_OTHER): Payer: PPO | Admitting: Family Medicine

## 2023-10-08 VITALS — BP 108/58 | HR 84 | Temp 98.4°F | Ht 65.0 in | Wt 178.5 lb

## 2023-10-08 DIAGNOSIS — R051 Acute cough: Secondary | ICD-10-CM

## 2023-10-08 LAB — POC INFLUENZA A&B (BINAX/QUICKVUE)
Influenza A, POC: NEGATIVE
Influenza B, POC: NEGATIVE

## 2023-10-08 LAB — POC COVID19 BINAXNOW: SARS Coronavirus 2 Ag: NEGATIVE

## 2023-10-08 MED ORDER — ALBUTEROL SULFATE HFA 108 (90 BASE) MCG/ACT IN AERS: 2.0000 | INHALATION_SPRAY | Freq: Four times a day (QID) | RESPIRATORY_TRACT | 0 refills | Status: DC | PRN

## 2023-10-08 MED ORDER — PROMETHAZINE-DM 6.25-15 MG/5ML PO SYRP: 5.0000 mL | ORAL_SOLUTION | Freq: Four times a day (QID) | ORAL | 0 refills | Status: DC | PRN

## 2023-10-08 MED ORDER — DOXYCYCLINE HYCLATE 100 MG PO TABS: 100.0000 mg | ORAL_TABLET | Freq: Two times a day (BID) | ORAL | 0 refills | Status: DC

## 2023-10-08 MED ORDER — BENZONATATE 200 MG PO CAPS: 200.0000 mg | ORAL_CAPSULE | Freq: Three times a day (TID) | ORAL | 0 refills | Status: DC | PRN

## 2023-10-08 NOTE — Progress Notes (Signed)
   Subjective:    Patient ID: Karen Gonzales, female    DOB: 01-01-1946, 78 y.o.   MRN: [id number]  HPI Cough- sxs started Saturday.  Last night developed fever for the first time (Tm 101).  Denies HA.  'a little diarrhea this morning'.  No N/V.  + fatigue.  No sore throat.  No ear pain.  Denies sinus pain/pressure.  Cough is rarely productive of yellow sputum.  No known sick contacts.   Review of Systems For ROS see HPI     Objective:   Physical Exam Vitals reviewed.  Constitutional:      General: She is not in acute distress.    Appearance: Normal appearance. She is well-developed. She is not ill-appearing.  HENT:     Head: Normocephalic and atraumatic.     Right Ear: Tympanic membrane and ear canal normal.     Left Ear: Tympanic membrane and ear canal normal.     Nose: Congestion present. No rhinorrhea.     Comments: No TTP over frontal or maxillary sinuses Eyes:     Conjunctiva/sclera: Conjunctivae normal.     Pupils: Pupils are equal, round, and reactive to light.  Cardiovascular:     Rate and Rhythm: Normal rate and regular rhythm.     Heart sounds: Normal heart sounds. No murmur heard. Pulmonary:     Effort: Pulmonary effort is normal. No respiratory distress.     Breath sounds: Rhonchi (LLL) present.     Comments: Nearly continuous harsh, hacking cough Musculoskeletal:     Cervical back: Normal range of motion and neck supple.  Lymphadenopathy:     Cervical: No cervical adenopathy.  Skin:    General: Skin is warm and dry.  Neurological:     General: No focal deficit present.     Mental Status: She is alert and oriented to person, place, and time.  Psychiatric:        Mood and Affect: Mood normal.        Behavior: Behavior normal.        Thought Content: Thought content normal.           Assessment & Plan:  Acute cough- new.  Pt has nearly continuous harsh, hacking cough.  Developed fever last night- 3 days after cough started.  This is concerning for  bacterial infection.  Start Doxycycline BID.  Albuterol prn.  Cough syrup/medication as needed.  Reviewed supportive care and red flags that should prompt return.  Pt expressed understanding and is in agreement w/ plan.

## 2023-10-08 NOTE — Patient Instructions (Signed)
 Follow up as needed or as scheduled START the Doxycycline twice daily- take w/ food USE the Albuterol inhaler for coughing jags or shortness of breath TAKE the cough syrup to help you get some rest ADD the cough pills as needed REST! Tylenol as needed for fevers Drink LOTS of fluids Call with any questions or concerns- particularly if things change or worsen Hang in there!

## 2023-10-14 ENCOUNTER — Encounter: Payer: Self-pay | Admitting: Family Medicine

## 2023-10-14 ENCOUNTER — Ambulatory Visit: Admitting: Family Medicine

## 2023-10-14 VITALS — BP 120/54 | HR 93 | Temp 98.4°F

## 2023-10-14 DIAGNOSIS — R051 Acute cough: Secondary | ICD-10-CM | POA: Diagnosis not present

## 2023-10-14 LAB — POCT RESPIRATORY SYNCYTIAL VIRUS: RSV Rapid Ag: NEGATIVE

## 2023-10-14 MED ORDER — PROMETHAZINE-DM 6.25-15 MG/5ML PO SYRP: 5.0000 mL | ORAL_SOLUTION | Freq: Four times a day (QID) | ORAL | 0 refills | Status: DC | PRN

## 2023-10-14 MED ORDER — PREDNISONE 10 MG PO TABS: ORAL_TABLET | ORAL | 0 refills | Status: DC

## 2023-10-14 NOTE — Patient Instructions (Addendum)
 Follow up as needed or as scheduled START the Prednisone as directed- 3 pills at the same time x3 days, then 2 pills at the same time x3 days, then 1 pill daily.  Take w/ food  The prednisone will cause your sugar to go up so please be mindful of what you are eating (but your breathing is more important!) USE the Albuterol inhaler- 2 puffs every 4 hrs- until feeling better Take the cough pills and the syrup as needed REST! Call with any questions or concerns Hang in there!!

## 2023-10-14 NOTE — Progress Notes (Signed)
   Subjective:    Patient ID: Karen Gonzales, female    DOB: [birth year], 78 y.o.   MRN: [id number]  HPI Cough- pt was seen last week on 2/25 and started on Doxycycline for cough and fever.  After that visit, pt was informed that her son tested + for RSV.  She continues to have hacking, near continuous cough.  Non productive.  + SOB despite albuterol use.  Almost out of cough syrup.  Sxs worse at night.   Review of Systems For ROS see HPI    Objective:   Physical Exam Vitals reviewed.  Constitutional:      General: She is not in acute distress.    Appearance: Normal appearance. She is well-developed. She is not ill-appearing.  HENT:     Head: Normocephalic and atraumatic.     Right Ear: Tympanic membrane and ear canal normal.     Left Ear: Tympanic membrane and ear canal normal.     Nose: Congestion present. No rhinorrhea.     Comments: No TTP over frontal or maxillary sinuses Eyes:     Conjunctiva/sclera: Conjunctivae normal.     Pupils: Pupils are equal, round, and reactive to light.  Cardiovascular:     Rate and Rhythm: Normal rate and regular rhythm.     Heart sounds: Normal heart sounds. No murmur heard. Pulmonary:     Effort: Pulmonary effort is normal. No respiratory distress.     Breath sounds: Normal breath sounds. No wheezing or rhonchi.     Comments: Near continuous hacking cough Musculoskeletal:     Cervical back: Normal range of motion and neck supple.  Lymphadenopathy:     Cervical: No cervical adenopathy.  Skin:    General: Skin is warm and dry.  Neurological:     General: No focal deficit present.     Mental Status: She is alert and oriented to person, place, and time.  Psychiatric:        Mood and Affect: Mood normal.        Behavior: Behavior normal.        Thought Content: Thought content normal.           Assessment & Plan:  Cough- deteriorated.  Pt has known RSV exposure despite testing negative today.  Has already been tx'd w/ abx.  Reviewed  that RSV is a viral illness that needs to run its course.  Will start Prednisone taper, refill cough meds.  Reviewed supportive care and red flags that should prompt return.  Pt expressed understanding and is in agreement w/ plan.

## 2023-10-16 ENCOUNTER — Other Ambulatory Visit: Payer: Self-pay | Admitting: Family Medicine

## 2023-10-17 NOTE — Therapy (Signed)
 [place] [facility] [address] [place], [place], [address] Phone: [phone]   Fax:  [phone]  Patient Details  Name: Karen Gonzales MRN: [id number] Date of Birth: 1945/12/11 Referring Provider:  No ref. provider found  Encounter Date: 10/17/2023  PHYSICAL THERAPY DISCHARGE SUMMARY  Visits from Start of Care: 4  Current functional level related to goals / functional outcomes: See previous note   Remaining deficits: unknown   Education / Equipment: HEP initiated   Patient agrees to discharge. Patient goals were  IN PROGRESS . Patient is being discharged due to not returning since the last visit.  Dion Body, PT 10/17/2023, 4:06 PM  [place] [address], [address] [place], [place], [address] Phone: [phone]   Fax:  [phone]

## 2023-10-28 DIAGNOSIS — M5416 Radiculopathy, lumbar region: Secondary | ICD-10-CM | POA: Diagnosis not present

## 2023-11-04 ENCOUNTER — Other Ambulatory Visit: Payer: Self-pay | Admitting: Family Medicine

## 2023-11-10 ENCOUNTER — Encounter: Payer: Self-pay | Admitting: Family Medicine

## 2023-11-11 ENCOUNTER — Other Ambulatory Visit: Payer: Self-pay | Admitting: Family Medicine

## 2023-11-22 ENCOUNTER — Other Ambulatory Visit: Payer: Self-pay | Admitting: Family Medicine

## 2023-11-27 ENCOUNTER — Other Ambulatory Visit: Payer: Self-pay | Admitting: Family Medicine

## 2023-11-27 DIAGNOSIS — E1140 Type 2 diabetes mellitus with diabetic neuropathy, unspecified: Secondary | ICD-10-CM

## 2023-12-12 ENCOUNTER — Other Ambulatory Visit: Payer: Self-pay | Admitting: Family Medicine

## 2023-12-24 ENCOUNTER — Ambulatory Visit (INDEPENDENT_AMBULATORY_CARE_PROVIDER_SITE_OTHER): Payer: PPO | Admitting: Family Medicine

## 2023-12-24 ENCOUNTER — Encounter: Payer: Self-pay | Admitting: Family Medicine

## 2023-12-24 VITALS — BP 124/58 | HR 96 | Temp 98.0°F | Ht 65.0 in | Wt 179.1 lb

## 2023-12-24 DIAGNOSIS — M8580 Other specified disorders of bone density and structure, unspecified site: Secondary | ICD-10-CM

## 2023-12-24 DIAGNOSIS — Z7984 Long term (current) use of oral hypoglycemic drugs: Secondary | ICD-10-CM

## 2023-12-24 DIAGNOSIS — Z0000 Encounter for general adult medical examination without abnormal findings: Secondary | ICD-10-CM | POA: Diagnosis not present

## 2023-12-24 DIAGNOSIS — E1140 Type 2 diabetes mellitus with diabetic neuropathy, unspecified: Secondary | ICD-10-CM

## 2023-12-24 LAB — CBC WITH DIFFERENTIAL/PLATELET
Basophils Absolute: 0 10*3/uL (ref 0.0–0.1)
Basophils Relative: 0.3 % (ref 0.0–3.0)
Eosinophils Absolute: 0.1 10*3/uL (ref 0.0–0.7)
Eosinophils Relative: 0.8 % (ref 0.0–5.0)
HCT: 37.8 % (ref 36.0–46.0)
Hemoglobin: 12 g/dL (ref 12.0–15.0)
Lymphocytes Relative: 9.8 % — ABNORMAL LOW (ref 12.0–46.0)
Lymphs Abs: 1.3 10*3/uL (ref 0.7–4.0)
MCHC: 31.7 g/dL (ref 30.0–36.0)
MCV: 81.5 fl (ref 78.0–100.0)
Monocytes Absolute: 0.8 10*3/uL (ref 0.1–1.0)
Monocytes Relative: 6.2 % (ref 3.0–12.0)
Neutro Abs: 11.3 10*3/uL — ABNORMAL HIGH (ref 1.4–7.7)
Neutrophils Relative %: 82.9 % — ABNORMAL HIGH (ref 43.0–77.0)
Platelets: 201 10*3/uL (ref 150.0–400.0)
RBC: 4.63 Mil/uL (ref 3.87–5.11)
RDW: 15.5 % (ref 11.5–15.5)
WBC: 13.7 10*3/uL — ABNORMAL HIGH (ref 4.0–10.5)

## 2023-12-24 LAB — HEPATIC FUNCTION PANEL
ALT: 31 U/L (ref 0–35)
AST: 30 U/L (ref 0–37)
Albumin: 4.7 g/dL (ref 3.5–5.2)
Alkaline Phosphatase: 54 U/L (ref 39–117)
Bilirubin, Direct: 0.2 mg/dL (ref 0.0–0.3)
Total Bilirubin: 0.7 mg/dL (ref 0.2–1.2)
Total Protein: 8.1 g/dL (ref 6.0–8.3)

## 2023-12-24 LAB — LIPID PANEL
Cholesterol: 113 mg/dL (ref 0–200)
HDL: 42 mg/dL (ref >39.00)
LDL Cholesterol: 40 mg/dL (ref 0–99)
NonHDL: 71.17
Total CHOL/HDL Ratio: 3
Triglycerides: 154 mg/dL — ABNORMAL HIGH (ref 0.0–149.0)
VLDL: 30.8 mg/dL (ref 0.0–40.0)

## 2023-12-24 LAB — BASIC METABOLIC PANEL WITH GFR
BUN: 26 mg/dL — ABNORMAL HIGH (ref 6–23)
CO2: 24 meq/L (ref 19–32)
Calcium: 9.9 mg/dL (ref 8.4–10.5)
Chloride: 100 meq/L (ref 96–112)
Creatinine, Ser: 0.92 mg/dL (ref 0.40–1.20)
GFR: 60.46 mL/min (ref >60.00)
Glucose, Bld: 164 mg/dL — ABNORMAL HIGH (ref 70–99)
Potassium: 4.5 meq/L (ref 3.5–5.1)
Sodium: 134 meq/L — ABNORMAL LOW (ref 135–145)

## 2023-12-24 LAB — MICROALBUMIN / CREATININE URINE RATIO
Creatinine,U: 77.5 mg/dL
Microalb Creat Ratio: 58.9 mg/g — ABNORMAL HIGH (ref 0.0–30.0)
Microalb, Ur: 4.6 mg/dL — ABNORMAL HIGH (ref 0.0–1.9)

## 2023-12-24 LAB — VITAMIN D 25 HYDROXY (VIT D DEFICIENCY, FRACTURES): VITD: 25.56 ng/mL — ABNORMAL LOW (ref 30.00–100.00)

## 2023-12-24 LAB — HEMOGLOBIN A1C: Hgb A1c MFr Bld: 7.7 % — ABNORMAL HIGH (ref 4.6–6.5)

## 2023-12-24 LAB — TSH: TSH: 1.43 u[IU]/mL (ref 0.35–5.50)

## 2023-12-24 MED ORDER — OMEPRAZOLE 40 MG PO CPDR: 40.0000 mg | DELAYED_RELEASE_CAPSULE | Freq: Two times a day (BID) | ORAL | 3 refills | Status: DC

## 2023-12-24 NOTE — Progress Notes (Signed)
 Subjective:    Patient ID: Karen Gonzales, female    DOB: 09-Dec-1945, 78 y.o.   MRN: [id number]  HPI CPE- due for mammo (needs to schedule), DEXA.  UTD on foot exam, eye exam, PNA.  Patient Care Team    Relationship Specialty Notifications Start End  Jess Morita, MD PCP - General Family Medicine  11/07/15   Sheryle Donning, MD PCP - Cardiology Cardiology  07/11/22   Agustina Aldrich, MD Consulting Physician Neurosurgery  07/02/11   Jolinda Necessary, MD (Inactive) Consulting Physician Gastroenterology  11/07/15   Brandt Cake, DPM Consulting Physician Podiatry  11/07/15     Health Maintenance  Topic Date Due   Zoster Vaccines- Shingrix (1 of 2) Never done   DTaP/Tdap/Td (3 - Td or Tdap) 03/12/2021   COVID-19 Vaccine (1 - 2024-25 season) Never done   MAMMOGRAM  10/02/2023   DEXA SCAN  11/22/2023   Medicare Annual Wellness (AWV)  11/28/2023   Diabetic kidney evaluation - Urine ACR  12/17/2023   INFLUENZA VACCINE  03/13/2024   HEMOGLOBIN A1C  03/22/2024   FOOT EXAM  06/20/2024   OPHTHALMOLOGY EXAM  06/26/2024   Diabetic kidney evaluation - eGFR measurement  09/22/2024   Pneumonia Vaccine 52+ Years old  Completed   Hepatitis C Screening  Completed   HPV VACCINES  Aged Out   Meningococcal B Vaccine  Aged Out   Colonoscopy  Discontinued      Review of Systems Patient reports no vision/ hearing changes, adenopathy,fever, weight change,  persistant/recurrent hoarseness , swallowing issues, chest pain, palpitations, edema, persistant/recurrent cough, hemoptysis, dyspnea (rest/exertional/paroxysmal nocturnal), gastrointestinal bleeding (melena, rectal bleeding), abdominal pain, significant heartburn, bowel changes, GU symptoms (dysuria, hematuria, incontinence), Gyn symptoms (abnormal  bleeding, pain),  syncope, focal weakness, memory loss, numbness & tingling, skin/hair/nail changes, abnormal bruising or bleeding, anxiety, or depression.   + belching- sour brash, this  morning vomited stomach acid.  Was previously on Omeprazole  40mg  BID but is currently only taking 1 daily.    Objective:   Physical Exam General Appearance:    Alert, cooperative, no distress, appears stated age  Head:    Normocephalic, without obvious abnormality, atraumatic  Eyes:    PERRL, conjunctiva/corneas clear, EOM's intact both eyes  Ears:    Normal TM's and external ear canals, both ears  Nose:   Nares normal, septum midline, mucosa normal, no drainage    or sinus tenderness  Throat:   Lips, mucosa, and tongue normal; teeth and gums normal  Neck:   Supple, symmetrical, trachea midline, no adenopathy;    Thyroid : no enlargement/tenderness/nodules  Back:     Symmetric, no curvature, ROM normal, no CVA tenderness  Lungs:     Clear to auscultation bilaterally, respirations unlabored  Chest Wall:    No tenderness or deformity   Heart:    Regular rate and rhythm, S1 and S2 normal, + III/VI SEM  Breast Exam:    Deferred to mammo  Abdomen:     Soft, non-tender, bowel sounds active all four quadrants,    no masses, no organomegaly  Genitalia:    Deferred to GYN  Rectal:    Extremities:   Extremities normal, atraumatic, no cyanosis or edema  Pulses:   2+ and symmetric all extremities  Skin:   Skin color, texture, turgor normal, no rashes or lesions  Lymph nodes:   Cervical, supraclavicular, and axillary nodes normal  Neurologic:   CNII-XII intact, normal strength, sensation and reflexes    throughout  Assessment & Plan:

## 2023-12-24 NOTE — Assessment & Plan Note (Signed)
 Pt's PE WNL w/ exception of BMI and known murmur.  UTD on foot exam, eye exam, PNA.  Micro albumin, DEXA ordered.  Pt to schedule mammo.  Check labs.  Anticipatory guidance provided.

## 2023-12-24 NOTE — Patient Instructions (Addendum)
 Follow up in 3-4 months to recheck sugar We'll notify you of your lab results and make any changes if needed Continue to work on healthy diet and regular exercise- you look great! Call and schedule your mammogram at your convenience We'll call you to schedule your bone density Call with any questions or concerns Stay Safe!  Stay Healthy! Hang in there!!

## 2023-12-25 ENCOUNTER — Encounter: Payer: Self-pay | Admitting: Family Medicine

## 2023-12-25 ENCOUNTER — Ambulatory Visit: Payer: Self-pay | Admitting: Family Medicine

## 2023-12-25 ENCOUNTER — Other Ambulatory Visit: Payer: Self-pay

## 2023-12-25 DIAGNOSIS — E559 Vitamin D deficiency, unspecified: Secondary | ICD-10-CM

## 2023-12-25 MED ORDER — VITAMIN D (ERGOCALCIFEROL) 1.25 MG (50000 UNIT) PO CAPS: 50000.0000 [IU] | ORAL_CAPSULE | ORAL | 0 refills | Status: DC

## 2023-12-25 NOTE — Telephone Encounter (Signed)
 Lab visit has been made CBC ordered Vitamin D  sent in  Pt has been notified

## 2023-12-25 NOTE — Telephone Encounter (Signed)
-----   Message from Laymon Priest sent at 12/25/2023  7:31 AM EDT ----- Your A1C is stable- this is good news!  Your cholesterol looks great!  No changes at this time  Your white blood cell count is elevated which can mean inflammation or infection.  We will want to repeat this next week at a lab only visit to make sure things are improving (CBC w/ diff, dx leukocytosis)  Your Vit D is low.  Based on this, we need to start 50,000 units weekly x12 weeks in addition to daily OTC supplement of at least 2000 units.   Remainder of labs are stable and look good

## 2024-01-02 ENCOUNTER — Other Ambulatory Visit (INDEPENDENT_AMBULATORY_CARE_PROVIDER_SITE_OTHER)

## 2024-01-02 DIAGNOSIS — E559 Vitamin D deficiency, unspecified: Secondary | ICD-10-CM

## 2024-01-02 LAB — CBC WITH DIFFERENTIAL/PLATELET
Basophils Absolute: 0.1 10*3/uL (ref 0.0–0.1)
Basophils Relative: 1 % (ref 0.0–3.0)
Eosinophils Absolute: 0.2 10*3/uL (ref 0.0–0.7)
Eosinophils Relative: 3.4 % (ref 0.0–5.0)
HCT: 33 % — ABNORMAL LOW (ref 36.0–46.0)
Hemoglobin: 10.8 g/dL — ABNORMAL LOW (ref 12.0–15.0)
Lymphocytes Relative: 33.2 % (ref 12.0–46.0)
Lymphs Abs: 2.3 10*3/uL (ref 0.7–4.0)
MCHC: 32.6 g/dL (ref 30.0–36.0)
MCV: 79.5 fl (ref 78.0–100.0)
Monocytes Absolute: 0.7 10*3/uL (ref 0.1–1.0)
Monocytes Relative: 10.5 % (ref 3.0–12.0)
Neutro Abs: 3.5 10*3/uL (ref 1.4–7.7)
Neutrophils Relative %: 51.9 % (ref 43.0–77.0)
Platelets: 174 10*3/uL (ref 150.0–400.0)
RBC: 4.15 Mil/uL (ref 3.87–5.11)
RDW: 15.1 % (ref 11.5–15.5)
WBC: 6.8 10*3/uL (ref 4.0–10.5)

## 2024-01-03 ENCOUNTER — Ambulatory Visit: Payer: Self-pay | Admitting: Family Medicine

## 2024-01-03 ENCOUNTER — Telehealth: Payer: Self-pay

## 2024-01-03 NOTE — Telephone Encounter (Signed)
 Copied from CRM [id number]. Topic: Clinical - Lab/Test Results >> Jan 03, 2024  2:08 PM Clyde Darling P wrote: Reason for CRM: Pt called to go over lab results/ relayed result pt understood.

## 2024-01-11 ENCOUNTER — Other Ambulatory Visit: Payer: Self-pay | Admitting: Family Medicine

## 2024-01-20 ENCOUNTER — Ambulatory Visit (HOSPITAL_BASED_OUTPATIENT_CLINIC_OR_DEPARTMENT_OTHER): Payer: PPO | Admitting: Cardiology

## 2024-01-20 ENCOUNTER — Encounter (HOSPITAL_BASED_OUTPATIENT_CLINIC_OR_DEPARTMENT_OTHER): Payer: Self-pay | Admitting: Cardiology

## 2024-01-20 VITALS — BP 146/70 | HR 84 | Ht 65.0 in | Wt 180.9 lb

## 2024-01-20 DIAGNOSIS — E782 Mixed hyperlipidemia: Secondary | ICD-10-CM | POA: Diagnosis not present

## 2024-01-20 DIAGNOSIS — E1140 Type 2 diabetes mellitus with diabetic neuropathy, unspecified: Secondary | ICD-10-CM | POA: Diagnosis not present

## 2024-01-20 DIAGNOSIS — I350 Nonrheumatic aortic (valve) stenosis: Secondary | ICD-10-CM

## 2024-01-20 DIAGNOSIS — Z952 Presence of prosthetic heart valve: Secondary | ICD-10-CM | POA: Diagnosis not present

## 2024-01-20 DIAGNOSIS — I10 Essential (primary) hypertension: Secondary | ICD-10-CM | POA: Diagnosis not present

## 2024-01-20 NOTE — Patient Instructions (Signed)
 Medication Instructions:  Your physician recommends that you continue on your current medications as directed. Please refer to the Current Medication list given to you today.   Follow-Up: Please follow up in 12 months with Dr. Veryl Gottron, Slater Duncan, NP or Neomi Banks, NP

## 2024-01-20 NOTE — Progress Notes (Signed)
 Cardiology Office Note:  .   Date:  01/20/2024  ID:  Bascom Bossier, DOB Oct 31, 1945, MRN [id number] PCP: Jess Morita, MD  [facility] Providers Cardiologist:  Sheryle Donning, MD {  History of Present Illness: .   Karen Gonzales is a 78 y.o. female with a hx of AS s/p TAVR 08/2022, hypertension, hyperlipidemia, diabetes, DVT, GERD, hyponatremia (with diuretic use), fatty liver disease (nonalcoholic), anti-cardiolipin antibody syndrome, arthritis, and COVID-19, who is seen for follow up today. I initially met her 07/17/21 as a new consult at the request of Jess Morita, MD for the evaluation and management of aortic valve stenosis.   Pertinent CV history: Severe AS s/p TAVR 12024. R/LHC 07/2022 with no significant CAD.  Today: Overall doing well. Has periodic times when she is sitting still, feels like the room is spinning. We discussed these at the last visit too; still present, not getting worse, but didn't feel that the physical therapy sessions changed anything. Better if she closes her eyes. Doesn't take meclizine  unless she is having a bad episode. Happening 1-2 times/week.  Has had some stress with caring for her brother, BP up higher than normal. Doesn't check routinely at home but does have a cuff.   ROS: Denies shortness of breath at rest or with normal exertion. No PND, orthopnea, LE edema or unexpected weight gain. No syncope or palpitations. ROS otherwise negative except as noted.   Studies Reviewed: Aaron Aas    EKG:       Physical Exam:   VS:  BP (!) 146/70   Pulse 84   Ht 5\' 5"  (1.651 m)   Wt 180 lb 14.4 oz (82.1 kg)   SpO2 94%   BMI 30.10 kg/m    Wt Readings from Last 3 Encounters:  01/20/24 180 lb 14.4 oz (82.1 kg)  12/24/23 179 lb 2 oz (81.3 kg)  10/08/23 178 lb 8 oz (81 kg)    GEN: Well nourished, well developed in no acute distress HEENT: Normal, moist mucous membranes NECK: No JVD CARDIAC: regular rhythm, normal S1 and S2, no rubs or  gallops. 2/6 systolic murmur. VASCULAR: Radial and DP pulses 2+ bilaterally. No carotid bruits RESPIRATORY:  Clear to auscultation without rales, wheezing or rhonchi  ABDOMEN: Soft, non-tender, non-distended MUSCULOSKELETAL:  Ambulates independently SKIN: Warm and dry, no edema NEUROLOGIC:  Alert and oriented x 3. No focal neuro deficits noted. PSYCHIATRIC:  Normal affect    ASSESSMENT AND PLAN: .    Vertigo -sometimes positional/head turning, sometimes not, occurs when sitting, not better with vestibular therapy. Has been intermittent since TAVR.  Aortic stenosis -s/p TAVR 08/2022 -on aspirin  -has dental prophylaxis -doing very well   Hypertension: -always slightly higher in the office, was 124/58 at recent visit with Dr. Paulla Bossier -continue amlodipine , losartan  -discussed checking at home   Type II diabetes Mixed hyperlipidemia Coronary calcification without CAD on cath -on empagliflozin , glipizide , metformin , sitagliptin  -continue rosuvastatin  10 mg, LDL 40 12/24/23. TG borderline at 154 -continue aspirin  81 mg daily  CV risk counseling and prevention -recommend heart healthy/Mediterranean diet, with whole grains, fruits, vegetable, fish, lean meats, nuts, and olive oil. Limit salt. -recommend moderate walking, 3-5 times/week for 30-50 minutes each session. Aim for at least 150 minutes.week. Goal should be pace of 3 miles/hours, or walking 1.5 miles in 30 minutes -recommend avoidance of tobacco products. Avoid excess alcohol.  Dispo: 12 mos or sooner as needed  Signed, Sheryle Donning, MD   Sheryle Donning, MD, PhD,  [facility]  [facility]  [facility] [address], [address] [place], [place] [address] [phone]

## 2024-02-09 ENCOUNTER — Other Ambulatory Visit: Payer: Self-pay | Admitting: Family Medicine

## 2024-02-10 ENCOUNTER — Other Ambulatory Visit: Payer: Self-pay | Admitting: Family Medicine

## 2024-02-10 DIAGNOSIS — Z1231 Encounter for screening mammogram for malignant neoplasm of breast: Secondary | ICD-10-CM

## 2024-02-10 NOTE — Telephone Encounter (Signed)
 Requested Prescriptions   Pending Prescriptions Disp Refills   meloxicam  (MOBIC ) 15 MG tablet [Pharmacy Med Name: MELOXICAM  15 MG TABLET] 30 tablet 1    Sig: TAKE 1 TABLET BY MOUTH EVERY DAY WITH A MEAL     Date of patient request: 02/10/2024 Last office visit: 12/24/2023 Upcoming visit: 03/25/2024 Date of last refill: 11/22/2023 Last refill amount: 30

## 2024-02-24 ENCOUNTER — Ambulatory Visit

## 2024-02-24 ENCOUNTER — Other Ambulatory Visit: Payer: Self-pay | Admitting: Family Medicine

## 2024-02-25 ENCOUNTER — Encounter (HOSPITAL_COMMUNITY): Payer: Self-pay

## 2024-02-25 ENCOUNTER — Ambulatory Visit: Payer: Self-pay

## 2024-02-25 ENCOUNTER — Ambulatory Visit (HOSPITAL_COMMUNITY)
Admission: EM | Admit: 2024-02-25 | Discharge: 2024-02-25 | Disposition: A | Discharge status: Home / Self Care | Attending: Physician Assistant | Admitting: Physician Assistant

## 2024-02-25 DIAGNOSIS — N3000 Acute cystitis without hematuria: Secondary | ICD-10-CM | POA: Insufficient documentation

## 2024-02-25 LAB — POCT URINALYSIS DIP (MANUAL ENTRY)
Bilirubin, UA: NEGATIVE
Glucose, UA: >=1000 mg/dL — AB
Leukocytes, UA: NEGATIVE
Nitrite, UA: POSITIVE — AB
Protein Ur, POC: 30 mg/dL — AB
Spec Grav, UA: <=1.005 — AB (ref 1.010–1.025)
Urobilinogen, UA: 2 U/dL — AB
pH, UA: 5 (ref 5.0–8.0)

## 2024-02-25 MED ORDER — AMOXICILLIN-POT CLAVULANATE 875-125 MG PO TABS
1.0000 | ORAL_TABLET | Freq: Two times a day (BID) | ORAL | 0 refills | Status: DC
Start: 2024-02-25 — End: 2024-03-25

## 2024-02-25 NOTE — Telephone Encounter (Signed)
 Patient called back she could only be seen after 1:00 pm today, is not available tomorrow, advised we only have a 10:40 open today, this was declined I advised she proceed to an urgent care when she is able then, E2C2 agent said she would look for another appt else where today

## 2024-02-25 NOTE — ED Provider Notes (Signed)
 [facility]    CSN: [id number] Arrival date & time: 02/25/24  0913      History   Chief Complaint Chief Complaint  Patient presents with   Urinary Tract Infection    HPI Karen Gonzales is a 78 y.o. female.    Urinary Tract Infection Associated symptoms: no abdominal pain, no fever, no nausea and no vomiting     Past Medical History:  Diagnosis Date   Anti-cardiolipin antibody syndrome (HCC) 06/25/2011   Arthritis    Bronchitis    history of   COVID-19    Diabetes mellitus    DVT (deep venous thrombosis) (HCC) 06/25/2011   Fatty liver disease, nonalcoholic    determinde by Ultrasound 11/11   GERD (gastroesophageal reflux disease)    H/O vitamin D  deficiency    Hiatal hernia    Hyperlipidemia    Hypertension    Hyponatremia    with diuretic use   Osteopenia    PONV (postoperative nausea and vomiting)    S/P TAVR (transcatheter aortic valve replacement) 08/28/2022   s/p TAVR with a 23 mm Edwards via the TF approach by Dr. Wonda & Dr. Murriel   Vertigo     Patient Active Problem List   Diagnosis Date Noted   Vertigo 02/22/2023   S/P TAVR (transcatheter aortic valve replacement) 08/28/2022   Nonrheumatic aortic valve stenosis 07/19/2022   Fatty liver 10/21/2020   Gastro-esophageal reflux disease without esophagitis 10/21/2020   Iron deficiency anemia 10/21/2020   Chronic venous insufficiency 10/09/2019   Obesity (BMI 30-39.9) 08/31/2019   Physical exam 10/10/2016   Gastroparesis due to DM (HCC) 06/05/2016   Type 2 diabetes mellitus with diabetic neuropathy, without long-term current use of insulin  (HCC) 11/07/2015   Hyperlipidemia associated with type 2 diabetes mellitus (HCC) 11/07/2015   Fibromyalgia 06/25/2011   Anti-cardiolipin antibody syndrome (HCC) 06/25/2011   Essential hypertension 09/24/2007    Past Surgical History:  Procedure Laterality Date   ABDOMINAL HYSTERECTOMY     ACHILLES TENDON SURGERY     bilateral    CARPAL TUNNEL  RELEASE     bilateral    CHOLECYSTECTOMY  1969   open   CRANIOTOMY  07/09/2011   Procedure: CRANIOTOMY HEMATOMA EVACUATION SUBDURAL;  Surgeon: Victory LABOR Pool;  Location: [facility];  Service: Neurosurgery;  Laterality: Right;  Excision of Skull Lesion-Right Right Parietal Crani w/ Resection of Skull Tumor (1 1/2 hours - to follow)   DILATION AND CURETTAGE OF UTERUS     INTRAOPERATIVE TRANSTHORACIC ECHOCARDIOGRAM N/A 08/28/2022   Procedure: INTRAOPERATIVE TRANSTHORACIC ECHOCARDIOGRAM;  Surgeon: Wonda Sharper, MD;  Location: [facility] OR;  Service: Open Heart Surgery;  Laterality: N/A;   JOINT REPLACEMENT  2004   left knee replacement    KNEE SURGERY     total of 6 surgeries on both knees    REPAIR PERONEAL TENDONS ANKLE     RIGHT/LEFT HEART CATH AND CORONARY ANGIOGRAPHY N/A 07/19/2022   Procedure: RIGHT/LEFT HEART CATH AND CORONARY ANGIOGRAPHY;  Surgeon: Verlin Lonni BIRCH, MD;  Location: [facility];  Service: Cardiovascular;  Laterality: N/A;   TRANSCATHETER AORTIC VALVE REPLACEMENT, TRANSFEMORAL N/A 08/28/2022   Procedure: Transcatheter Aortic Valve Replacement, Transfemoral Approach using Edwards 23 MM SAPIEN 3 Ultra;  Surgeon: Wonda Sharper, MD;  Location: [facility] OR;  Service: Open Heart Surgery;  Laterality: N/A;  Transfemoral   TUBAL LIGATION     TUMOR REMOVAL     on pelvic bone     OB History  No obstetric history on file.      Home Medications    Prior to Admission medications   Medication Sig Start Date End Date Taking? Authorizing Provider  albuterol  (VENTOLIN  HFA) 108 (90 Base) MCG/ACT inhaler TAKE 2 PUFFS BY MOUTH EVERY 6 HOURS AS NEEDED FOR WHEEZE OR SHORTNESS OF BREATH 11/04/23   Tabori, Katherine E, MD  amLODipine  (NORVASC ) 10 MG tablet TAKE 1 TABLET BY MOUTH EVERY DAY 09/04/23   Tabori, Katherine E, MD  aspirin  EC 81 MG tablet Take 1 tablet (81 mg total) by mouth daily. Swallow whole. 12/25/21   Lonni Slain, MD  Blood Glucose Monitoring Suppl (ONE TOUCH  ULTRA 2) w/Device KIT Pt uses glucometer to check sugars twice daily. Dx E11.9 02/03/16   Mahlon Comer BRAVO, MD  empagliflozin  (JARDIANCE ) 25 MG TABS tablet Take 1 tablet (25 mg total) by mouth daily. 02/22/23   Tabori, Katherine E, MD  gabapentin  (NEURONTIN ) 300 MG capsule TAKE 2 CAPSULES BY MOUTH 3 TIMES DAILY. 02/11/24   Tabori, Katherine E, MD  glipiZIDE  (GLUCOTROL  XL) 10 MG 24 hr tablet TAKE 1 TABLET (10 MG TOTAL) BY MOUTH DAILY WITH BREAKFAST. 02/10/24   Tabori, Katherine E, MD  Homeopathic Products (RESTFUL LEGS SL) Place 2 tablets under the tongue daily as needed (restless legs).    [provider]  Lancets [facility] DELICA PLUS LANCET33G) MISC USE WHEN CHECKING BLOOD SUGAR AS DIRECTED 04/22/23   Tabori, Katherine E, MD  losartan  (COZAAR ) 50 MG tablet TAKE 2 TABLETS BY MOUTH EVERY DAY 09/04/23   Tabori, Katherine E, MD  meclizine  (ANTIVERT ) 25 MG tablet Take 1 tablet (25 mg total) by mouth 3 (three) times daily as needed for dizziness. 09/14/22   Tabori, Katherine E, MD  meloxicam  (MOBIC ) 15 MG tablet TAKE 1 TABLET BY MOUTH EVERY DAY WITH A MEAL 02/10/24   Tabori, Katherine E, MD  Menthol -Methyl Salicylate (MUSCLE RUB) 10-15 % CREA Apply 1 Application topically as needed for muscle pain.    [provider]  metFORMIN  (GLUCOPHAGE ) 500 MG tablet TAKE 2 TABLETS BY MOUTH 2 TIMES DAILY WITH A MEAL. 11/27/23   Tabori, Katherine E, MD  omeprazole  (PRILOSEC) 40 MG capsule TAKE 1 CAPSULE BY MOUTH TWICE A DAY 02/25/24   Tabori, Katherine E, MD  [facility] ULTRA test strip CHECK BLOOD SUGAR 1-2 TIMES DAILY 08/08/23   Tabori, Katherine E, MD  oxymetazoline (AFRIN) 0.05 % nasal spray Place 1 spray into both nostrils 2 (two) times daily as needed for congestion.    [provider]  rosuvastatin  (CRESTOR ) 10 MG tablet TAKE 1 TABLET BY MOUTH EVERY DAY 05/23/23   Lonni Slain, MD  sitaGLIPtin  (JANUVIA ) 100 MG tablet Take 100 mg by mouth daily. 01/27/21   [provider]   tiZANidine  (ZANAFLEX ) 4 MG tablet TAKE 1 TABLET (4 MG TOTAL) BY MOUTH EVERY 8 (EIGHT) HOURS AS NEEDED FOR MUSCLE SPASMS 11/11/23   Tabori, Katherine E, MD  Vitamin D , Ergocalciferol , (DRISDOL ) 1.25 MG (50000 UNIT) CAPS capsule Take 1 capsule (50,000 Units total) by mouth every 7 (seven) days. 12/25/23   Mahlon Comer BRAVO, MD    Family History Family History  Problem Relation Age of Onset   Kidney disease Mother    Osteoporosis Mother    Heart disease Father    Cancer Father        lymphoma   Osteoporosis Father    Healthy Brother    Cancer Maternal Grandmother        lymphoma   Breast  cancer Maternal Grandmother    Cancer Paternal Grandmother        breast   Breast cancer Paternal Aunt    Hodgkin's lymphoma Grandchild     Social History Social History   Tobacco Use   Smoking status: Never   Smokeless tobacco: Never  Vaping Use   Vaping status: Never Used  Substance Use Topics   Alcohol use: No   Drug use: No     Allergies   Reglan [metoclopramide] and Estrogens   Review of Systems Review of Systems  Constitutional:  Negative for chills and fever.  Eyes:  Negative for discharge and redness.  Respiratory:  Negative for shortness of breath.   Gastrointestinal:  Negative for abdominal pain, nausea and vomiting.  Genitourinary:  Positive for dysuria, frequency and urgency.  Musculoskeletal:  Negative for back pain.     Physical Exam Triage Vital Signs ED Triage Vitals [02/25/24 0949]  Encounter Vitals Group     BP (!) 146/61     Girls Systolic BP Percentile      Girls Diastolic BP Percentile      Boys Systolic BP Percentile      Boys Diastolic BP Percentile      Pulse Rate 74     Resp 18     Temp 97.9 F (36.6 C)     Temp Source Oral     SpO2 90 %     Weight      Height      Head Circumference      Peak Flow      Pain Score      Pain Loc      Pain Education      Exclude from Growth Chart    No data found.  Updated Vital Signs BP (!) 146/61  (BP Location: Left Arm)   Pulse 74   Temp 97.9 F (36.6 C) (Oral)   Resp 18   SpO2 90%   Visual Acuity Right Eye Distance:   Left Eye Distance:   Bilateral Distance:    Right Eye Near:   Left Eye Near:    Bilateral Near:     Physical Exam Vitals and nursing note reviewed.  Constitutional:      General: She is not in acute distress.    Appearance: Normal appearance. She is not ill-appearing.  HENT:     Head: Normocephalic and atraumatic.  Eyes:     Conjunctiva/sclera: Conjunctivae normal.  Cardiovascular:     Rate and Rhythm: Normal rate.  Pulmonary:     Effort: Pulmonary effort is normal.  Neurological:     Mental Status: She is alert.  Psychiatric:        Mood and Affect: Mood normal.        Behavior: Behavior normal.        Thought Content: Thought content normal.      UC Treatments / Results  Labs (all labs ordered are listed, but only abnormal results are displayed) Labs Reviewed  POCT URINALYSIS DIP (MANUAL ENTRY) - Abnormal; Notable for the following components:      Result Value   Color, UA red (*)    Clarity, UA cloudy (*)    Glucose, UA >=1,000 (*)    Ketones, POC UA trace (5) (*)    Spec Grav, UA <=1.005 (*)    Blood, UA moderate (*)    Protein Ur, POC =30 (*)    Urobilinogen, UA 2.0 (*)    Nitrite, UA Positive (*)  All other components within normal limits    EKG   Radiology No results found.  Procedures Procedures (including critical care time)  Medications Ordered in UC Medications - No data to display  Initial Impression / Assessment and Plan / UC Course  I have reviewed the triage vital signs and the nursing notes.  Pertinent labs & imaging results that were available during my care of the patient were reviewed by me and considered in my medical decision making (see chart for details).     *** Final Clinical Impressions(s) / UC Diagnoses   Final diagnoses:  None   Discharge Instructions   None    ED Prescriptions    None    PDMP not reviewed this encounter.

## 2024-02-25 NOTE — Telephone Encounter (Signed)
 Called to make an appointment, husband answered he will pass message along and that she will call back so we can make appt for this

## 2024-02-25 NOTE — Telephone Encounter (Addendum)
 FYI Only or Action Required?: FYI only for provider.  Patient was last seen in primary care on 12/24/2023 by Mahlon Comer BRAVO, MD.  Called Nurse Triage reporting Urinary Tract Infection.  Symptoms began 2 days ago.  Interventions attempted: OTC medications: medication that makes my pee orange.  Symptoms are: gradually worsening.  Triage Disposition: See HCP Within 4 Hours (Or PCP Triage)  Patient/caregiver understands and will follow disposition?: Yes, will follow disposition  Pt declined appts today in clinic, advised to utilize UC.   Copied from CRM [id number]. Topic: Clinical - Red Word Triage >> Feb 25, 2024  8:24 AM Suzen RAMAN wrote: Red Word that prompted transfer to Nurse Triage:  painful/ burning with urination Reason for Disposition  Diabetes mellitus or weak immune system (e.g., HIV positive, cancer chemo, splenectomy, organ transplant, chronic steroids)  Answer Assessment - Initial Assessment Questions 1. SEVERITY: How bad is the pain?  (e.g., Scale 1-10; mild, moderate, or severe)     8 2. FREQUENCY: How many times have you had painful urination today?      Medicine OTC for pain helped, but started burning again today 3. PATTERN: Is pain present every time you urinate or just sometimes?      Every time 4. ONSET: When did the painful urination start?      2 days 5. FEVER: Do you have a fever? If Yes, ask: What is your temperature, how was it measured, and when did it start?     denies 6. PAST UTI: Have you had a urine infection before? If Yes, ask: When was the last time? and What happened that time?      States hx of UTIs 7. CAUSE: What do you think is causing the painful urination?  (e.g., UTI, scratch, Herpes sore)     UTI 8. OTHER SYMPTOMS: Do you have any other symptoms? (e.g., blood in urine, flank pain, genital sores, urgency, vaginal discharge)     denies  Protocols used: Urination Pain - Female-A-AH

## 2024-02-25 NOTE — ED Triage Notes (Signed)
 Pt c/o burning on urination with urgency and frequency x2 days. States taking AZO with no relief.

## 2024-02-26 ENCOUNTER — Ambulatory Visit
Admission: RE | Admit: 2024-02-26 | Discharge: 2024-02-26 | Disposition: A | Discharge status: Home / Self Care | Source: Ambulatory Visit | Attending: Family Medicine | Admitting: Family Medicine

## 2024-02-26 DIAGNOSIS — Z1231 Encounter for screening mammogram for malignant neoplasm of breast: Secondary | ICD-10-CM

## 2024-02-26 LAB — URINE CULTURE: Culture: NO GROWTH

## 2024-02-27 ENCOUNTER — Ambulatory Visit (HOSPITAL_COMMUNITY): Payer: Self-pay

## 2024-02-27 ENCOUNTER — Ambulatory Visit (INDEPENDENT_AMBULATORY_CARE_PROVIDER_SITE_OTHER): Admitting: Family Medicine

## 2024-02-27 ENCOUNTER — Encounter: Payer: Self-pay | Admitting: Family Medicine

## 2024-02-27 VITALS — BP 132/56 | HR 95 | Temp 98.3°F | Ht 65.0 in | Wt 185.0 lb

## 2024-02-27 DIAGNOSIS — R350 Frequency of micturition: Secondary | ICD-10-CM | POA: Diagnosis not present

## 2024-02-27 LAB — POCT URINALYSIS DIPSTICK
Bilirubin, UA: NEGATIVE
Blood, UA: NEGATIVE
Glucose, UA: POSITIVE — AB
Ketones, UA: NEGATIVE
Leukocytes, UA: NEGATIVE
Nitrite, UA: NEGATIVE
Protein, UA: NEGATIVE
Spec Grav, UA: 1.015 (ref 1.010–1.025)
Urobilinogen, UA: 0.2 U/dL
pH, UA: 5.5 (ref 5.0–8.0)

## 2024-02-27 NOTE — Patient Instructions (Signed)
 Great news on the urine culture results.  Urine test in the office also looks good today.  You certainly could have had an early urinary tract infection that was already improving when you saw urgent care.   With the improving symptoms, off the Azo, I think it would be reasonable to stop the antibiotic at this time, especially since it is causing diarrhea.  Continue to drink plenty of fluids.  If any worsening symptoms give us  a call and we can try to get you back in for a repeat urine test and evaluation and then decide if antibiotics are needed at that time.  Again with the information we have at this time I do not think any new antibiotics are needed.  Take care and thanks for coming in today!

## 2024-02-27 NOTE — Progress Notes (Signed)
 Subjective:  Patient ID: Karen Gonzales, female    DOB: 09-27-1945  Age: 78 y.o. MRN: [id number]  CC:  Chief Complaint  Patient presents with   Follow-up    Pt is here today to F/U on UC visit Pt reports she has a funny feeling but denies burning, pressure or discomfort Pt started taking the SX amoxicillin -clavulanate (AUGMENTIN ) 875-125 MG     HPI Karen Gonzales presents for  Acute visit for above, PCP is Dr. Mahlon. Here with daughter today.  Acute cystitis Noted urgent care visit on 02/25/2024, started on Augmentin  every 12 hours for 7 days.  Note is not complete from that visit yet but I am able to see that her urinalysis was positive for nitrite, moderate blood, 30 protein, trace ketones, greater than 1000 glucose (she is on Jardiance )  - was on azo. triage note at the urgent care visit indicated burning on urination and urgency, frequency for 2 days without relief using Azo.  Felt like her typical UTI symptoms when seen at urgent care. Urinary symptoms have improved - burning has resolved. No fevers. Less urgency, less frequent urination. Does have some diarrhea few times per day since starting the augmentin . S/p 4 doses total of augmentin .  Urine culture negative - noted this morning. Off azo past 2 days and still improving.        History Patient Active Problem List   Diagnosis Date Noted   Vertigo 02/22/2023   S/P TAVR (transcatheter aortic valve replacement) 08/28/2022   Nonrheumatic aortic valve stenosis 07/19/2022   Fatty liver 10/21/2020   Gastro-esophageal reflux disease without esophagitis 10/21/2020   Iron deficiency anemia 10/21/2020   Chronic venous insufficiency 10/09/2019   Obesity (BMI 30-39.9) 08/31/2019   Physical exam 10/10/2016   Gastroparesis due to DM (HCC) 06/05/2016   Type 2 diabetes mellitus with diabetic neuropathy, without long-term current use of insulin  (HCC) 11/07/2015   Hyperlipidemia associated with type 2 diabetes mellitus (HCC)  11/07/2015   Fibromyalgia 06/25/2011   Anti-cardiolipin antibody syndrome (HCC) 06/25/2011   Essential hypertension 09/24/2007   Past Medical History:  Diagnosis Date   Anti-cardiolipin antibody syndrome (HCC) 06/25/2011   Arthritis    Bronchitis    history of   COVID-19    Diabetes mellitus    DVT (deep venous thrombosis) (HCC) 06/25/2011   Fatty liver disease, nonalcoholic    determinde by Ultrasound 11/11   GERD (gastroesophageal reflux disease)    H/O vitamin D  deficiency    Hiatal hernia    Hyperlipidemia    Hypertension    Hyponatremia    with diuretic use   Osteopenia    PONV (postoperative nausea and vomiting)    S/P TAVR (transcatheter aortic valve replacement) 08/28/2022   s/p TAVR with a 23 mm Edwards via the TF approach by Dr. Wonda & Dr. Murriel   Vertigo    Past Surgical History:  Procedure Laterality Date   ABDOMINAL HYSTERECTOMY     ACHILLES TENDON SURGERY     bilateral    CARPAL TUNNEL RELEASE     bilateral    CHOLECYSTECTOMY  1969   open   CRANIOTOMY  07/09/2011   Procedure: CRANIOTOMY HEMATOMA EVACUATION SUBDURAL;  Surgeon: Victory LABOR Pool;  Location: [facility];  Service: Neurosurgery;  Laterality: Right;  Excision of Skull Lesion-Right Right Parietal Crani w/ Resection of Skull Tumor (1 1/2 hours - to follow)   DILATION AND CURETTAGE OF UTERUS     INTRAOPERATIVE TRANSTHORACIC ECHOCARDIOGRAM N/A  08/28/2022   Procedure: INTRAOPERATIVE TRANSTHORACIC ECHOCARDIOGRAM;  Surgeon: Wonda Sharper, MD;  Location: [facility] OR;  Service: Open Heart Surgery;  Laterality: N/A;   JOINT REPLACEMENT  2004   left knee replacement    KNEE SURGERY     total of 6 surgeries on both knees    REPAIR PERONEAL TENDONS ANKLE     RIGHT/LEFT HEART CATH AND CORONARY ANGIOGRAPHY N/A 07/19/2022   Procedure: RIGHT/LEFT HEART CATH AND CORONARY ANGIOGRAPHY;  Surgeon: Verlin Lonni BIRCH, MD;  Location: [facility];  Service: Cardiovascular;  Laterality: N/A;   TRANSCATHETER  AORTIC VALVE REPLACEMENT, TRANSFEMORAL N/A 08/28/2022   Procedure: Transcatheter Aortic Valve Replacement, Transfemoral Approach using Edwards 23 MM SAPIEN 3 Ultra;  Surgeon: Wonda Sharper, MD;  Location: [facility] OR;  Service: Open Heart Surgery;  Laterality: N/A;  Transfemoral   TUBAL LIGATION     TUMOR REMOVAL     on pelvic bone    Allergies  Allergen Reactions   Reglan [Metoclopramide] Other (See Comments)    Tremor    Estrogens Rash    Topical patch caused the rash   Prior to Admission medications   Medication Sig Start Date End Date Taking? Authorizing Provider  albuterol  (VENTOLIN  HFA) 108 (90 Base) MCG/ACT inhaler TAKE 2 PUFFS BY MOUTH EVERY 6 HOURS AS NEEDED FOR WHEEZE OR SHORTNESS OF BREATH 11/04/23   Mahlon Comer BRAVO, MD  amLODipine  (NORVASC ) 10 MG tablet TAKE 1 TABLET BY MOUTH EVERY DAY 09/04/23   Tabori, Katherine E, MD  amoxicillin -clavulanate (AUGMENTIN ) 875-125 MG tablet Take 1 tablet by mouth every 12 (twelve) hours. 02/25/24   Billy Asberry FALCON, PA-C  aspirin  EC 81 MG tablet Take 1 tablet (81 mg total) by mouth daily. Swallow whole. 12/25/21   Lonni Slain, MD  Blood Glucose Monitoring Suppl (ONE TOUCH ULTRA 2) w/Device KIT Pt uses glucometer to check sugars twice daily. Dx E11.9 02/03/16   Mahlon Comer BRAVO, MD  empagliflozin  (JARDIANCE ) 25 MG TABS tablet Take 1 tablet (25 mg total) by mouth daily. 02/22/23   Tabori, Katherine E, MD  gabapentin  (NEURONTIN ) 300 MG capsule TAKE 2 CAPSULES BY MOUTH 3 TIMES DAILY. 02/11/24   Tabori, Katherine E, MD  glipiZIDE  (GLUCOTROL  XL) 10 MG 24 hr tablet TAKE 1 TABLET (10 MG TOTAL) BY MOUTH DAILY WITH BREAKFAST. 02/10/24   Tabori, Katherine E, MD  Homeopathic Products (RESTFUL LEGS SL) Place 2 tablets under the tongue daily as needed (restless legs).    [provider]  Lancets [facility] DELICA PLUS LANCET33G) MISC USE WHEN CHECKING BLOOD SUGAR AS DIRECTED 04/22/23   Tabori, Katherine E, MD  losartan  (COZAAR ) 50 MG tablet TAKE 2  TABLETS BY MOUTH EVERY DAY 09/04/23   Tabori, Katherine E, MD  meclizine  (ANTIVERT ) 25 MG tablet Take 1 tablet (25 mg total) by mouth 3 (three) times daily as needed for dizziness. 09/14/22   Mahlon Comer BRAVO, MD  meloxicam  (MOBIC ) 15 MG tablet TAKE 1 TABLET BY MOUTH EVERY DAY WITH A MEAL 02/10/24   Tabori, Katherine E, MD  Menthol -Methyl Salicylate (MUSCLE RUB) 10-15 % CREA Apply 1 Application topically as needed for muscle pain.    [provider]  metFORMIN  (GLUCOPHAGE ) 500 MG tablet TAKE 2 TABLETS BY MOUTH 2 TIMES DAILY WITH A MEAL. 11/27/23   Tabori, Katherine E, MD  omeprazole  (PRILOSEC) 40 MG capsule TAKE 1 CAPSULE BY MOUTH TWICE A DAY 02/25/24   Tabori, Katherine E, MD  [facility] ULTRA test strip CHECK BLOOD SUGAR 1-2 TIMES DAILY 08/08/23  Tabori, Katherine E, MD  oxymetazoline (AFRIN) 0.05 % nasal spray Place 1 spray into both nostrils 2 (two) times daily as needed for congestion.    [provider]  rosuvastatin  (CRESTOR ) 10 MG tablet TAKE 1 TABLET BY MOUTH EVERY DAY 05/23/23   Lonni Slain, MD  sitaGLIPtin  (JANUVIA ) 100 MG tablet Take 100 mg by mouth daily. 01/27/21   [provider]  tiZANidine  (ZANAFLEX ) 4 MG tablet TAKE 1 TABLET (4 MG TOTAL) BY MOUTH EVERY 8 (EIGHT) HOURS AS NEEDED FOR MUSCLE SPASMS 11/11/23   Tabori, Katherine E, MD  Vitamin D , Ergocalciferol , (DRISDOL ) 1.25 MG (50000 UNIT) CAPS capsule Take 1 capsule (50,000 Units total) by mouth every 7 (seven) days. 12/25/23   Mahlon Comer BRAVO, MD   Social History   Socioeconomic History   Marital status: Married    Spouse name: Not on file   Number of children: Not on file   Years of education: Not on file   Highest education level: GED or equivalent  Occupational History   Not on file  Tobacco Use   Smoking status: Never   Smokeless tobacco: Never  Vaping Use   Vaping status: Never Used  Substance and Sexual Activity   Alcohol use: No   Drug use: No   Sexual activity: Not Currently   Other Topics Concern   Not on file  Social History Narrative   Not on file   Social Drivers of Health   Financial Resource Strain: Medium Risk (09/22/2023)   Overall Financial Resource Strain (CARDIA)    Difficulty of Paying Living Expenses: Somewhat hard  Food Insecurity: No Food Insecurity (09/22/2023)   Hunger Vital Sign    Worried About Running Out of Food in the Last Year: Never true    Ran Out of Food in the Last Year: Never true  Transportation Needs: No Transportation Needs (09/22/2023)   PRAPARE - Administrator, Civil Service (Medical): No    Lack of Transportation (Non-Medical): No  Physical Activity: Insufficiently Active (09/22/2023)   Exercise Vital Sign    Days of Exercise per Week: 1 day    Minutes of Exercise per Session: 30 min  Stress: Stress Concern Present (09/22/2023)   [employer] of Occupational Health - Occupational Stress Questionnaire    Feeling of Stress : Very much  Social Connections: Moderately Integrated (09/22/2023)   Social Connection and Isolation Panel    Frequency of Communication with Friends and Family: Once a week    Frequency of Social Gatherings with Friends and Family: Once a week    Attends Religious Services: More than 4 times per year    Active Member of [employer] or Organizations: Yes    Attends Banker Meetings: More than 4 times per year    Marital Status: Married  Catering manager Violence: Not At Risk (11/28/2022)   Humiliation, Afraid, Rape, and Kick questionnaire    Fear of Current or Ex-Partner: No    Emotionally Abused: No    Physically Abused: No    Sexually Abused: No    Review of Systems   Objective:   Vitals:   02/27/24 1554  BP: (!) 132/56  Pulse: 95  Temp: 98.3 F (36.8 C)  SpO2: 95%  Weight: 185 lb (83.9 kg)  Height: 5' 5 (1.651 m)     Physical Exam Constitutional:      Appearance: Normal appearance. She is well-developed.  HENT:     Head: Normocephalic and atraumatic.   Pulmonary:  Effort: Pulmonary effort is normal.  Abdominal:     General: There is no distension.     Palpations: Abdomen is soft.     Tenderness: There is no abdominal tenderness. There is no right CVA tenderness, left CVA tenderness, guarding or rebound.  Skin:    General: Skin is warm.  Neurological:     Mental Status: She is alert and oriented to person, place, and time.  Psychiatric:        Behavior: Behavior normal.      Results for orders placed or performed in visit on 02/27/24  POCT urinalysis dipstick   Collection Time: 02/27/24  4:06 PM  Result Value Ref Range   Color, UA     Clarity, UA     Glucose, UA Positive (A) Negative   Bilirubin, UA NEG    Ketones, UA NEG    Spec Grav, UA 1.015 1.010 - 1.025   Blood, UA NEG    pH, UA 5.5 5.0 - 8.0   Protein, UA Negative Negative   Urobilinogen, UA 0.2 0.2 or 1.0 E.U./dL   Nitrite, UA NEG    Leukocytes, UA Negative Negative   Appearance     Odor       Assessment & Plan:  TERICKA DEVINCENZI is a 78 y.o. female . Urine frequency - Plan: POCT urinalysis dipstick Suspected UTI at urgent care given typical symptoms, abnormal urinalysis at that time including with nitrite.  Was on Azo at the time.  Started on Augmentin , unfortunately some diarrhea with that medication as above, but symptoms have improved, reassuring in office urinalysis.  And now with negative urine culture from initial visit at urgent care.  Potentially had urethritis or UTI that was already starting to improve.  Decided to stop antibiotics at this time, continue to drink fluids, monitor for any worsening of symptoms with RTC precautions given.  No orders of the defined types were placed in this encounter.  Patient Instructions  Great news on the urine culture results.  Urine test in the office also looks good today.  You certainly could have had an early urinary tract infection that was already improving when you saw urgent care.   With the improving  symptoms, off the Azo, I think it would be reasonable to stop the antibiotic at this time, especially since it is causing diarrhea.  Continue to drink plenty of fluids.  If any worsening symptoms give us  a call and we can try to get you back in for a repeat urine test and evaluation and then decide if antibiotics are needed at that time.  Again with the information we have at this time I do not think any new antibiotics are needed.  Take care and thanks for coming in today!     Signed,   Reyes Pines, MD [facility], [facility] 02/27/24 4:51 PM

## 2024-03-11 ENCOUNTER — Other Ambulatory Visit: Payer: Self-pay | Admitting: Family Medicine

## 2024-03-11 ENCOUNTER — Other Ambulatory Visit (HOSPITAL_BASED_OUTPATIENT_CLINIC_OR_DEPARTMENT_OTHER): Payer: Self-pay | Admitting: Cardiology

## 2024-03-11 DIAGNOSIS — E1140 Type 2 diabetes mellitus with diabetic neuropathy, unspecified: Secondary | ICD-10-CM

## 2024-03-11 DIAGNOSIS — E782 Mixed hyperlipidemia: Secondary | ICD-10-CM

## 2024-03-24 DIAGNOSIS — E119 Type 2 diabetes mellitus without complications: Secondary | ICD-10-CM | POA: Insufficient documentation

## 2024-03-24 DIAGNOSIS — E118 Type 2 diabetes mellitus with unspecified complications: Secondary | ICD-10-CM | POA: Insufficient documentation

## 2024-03-25 ENCOUNTER — Ambulatory Visit: Admitting: Family Medicine

## 2024-03-25 ENCOUNTER — Encounter: Payer: Self-pay | Admitting: Family Medicine

## 2024-03-25 VITALS — BP 140/52 | HR 83 | Temp 98.6°F | Ht 65.0 in | Wt 183.4 lb

## 2024-03-25 DIAGNOSIS — Z7984 Long term (current) use of oral hypoglycemic drugs: Secondary | ICD-10-CM | POA: Diagnosis not present

## 2024-03-25 DIAGNOSIS — E119 Type 2 diabetes mellitus without complications: Secondary | ICD-10-CM

## 2024-03-25 DIAGNOSIS — E559 Vitamin D deficiency, unspecified: Secondary | ICD-10-CM | POA: Diagnosis not present

## 2024-03-25 LAB — BASIC METABOLIC PANEL WITH GFR
BUN: 16 mg/dL (ref 6–23)
CO2: 23 meq/L (ref 19–32)
Calcium: 9.6 mg/dL (ref 8.4–10.5)
Chloride: 102 meq/L (ref 96–112)
Creatinine, Ser: 0.9 mg/dL (ref 0.40–1.20)
GFR: 61.97 mL/min (ref >60.00)
Glucose, Bld: 147 mg/dL — ABNORMAL HIGH (ref 70–99)
Potassium: 4.4 meq/L (ref 3.5–5.1)
Sodium: 134 meq/L — ABNORMAL LOW (ref 135–145)

## 2024-03-25 LAB — HEMOGLOBIN A1C: Hgb A1c MFr Bld: 7.7 % — ABNORMAL HIGH (ref 4.6–6.5)

## 2024-03-25 LAB — VITAMIN D 25 HYDROXY (VIT D DEFICIENCY, FRACTURES): VITD: 48.44 ng/mL (ref 30.00–100.00)

## 2024-03-25 MED ORDER — ONETOUCH DELICA PLUS LANCET33G MISC: 4 refills | Status: DC

## 2024-03-25 MED ORDER — ONETOUCH DELICA PLUS LANCET33G MISC
4 refills | Status: AC
Start: 2024-03-25 — End: ?

## 2024-03-25 NOTE — Patient Instructions (Signed)
 Follow up in 3-4 months to recheck blood pressure/cholesterol/sugar We'll notify you of your lab results and make any changes if needed Continue to work on healthy diet and regular exercise- you look great! Call with any questions or concerns Stay Safe!  Stay Healthy! Enjoy the rest of your summer!

## 2024-03-25 NOTE — Progress Notes (Signed)
   Subjective:    Patient ID: Karen Gonzales, female    DOB: 02/17/1946, 78 y.o.   MRN: [id number]  HPI DM- chronic problem, on Jardiance  25mg  daily, Glipizide  10mg  daily, Metformin  1000mg  BID, Januvia  100mg  daily.  UTD on eye exam, foot exam, microalbumin.  Denies CP.  Some SOB on hot days.  Some visual changes- known cataract.  No HA's, abd pain, N/V.  No numbness/tingling of hands/feet.   Review of Systems For ROS see HPI     Objective:   Physical Exam Vitals reviewed.  Constitutional:      General: She is not in acute distress.    Appearance: Normal appearance. She is well-developed. She is not ill-appearing.  HENT:     Head: Normocephalic and atraumatic.  Eyes:     Conjunctiva/sclera: Conjunctivae normal.     Pupils: Pupils are equal, round, and reactive to light.  Neck:     Thyroid : No thyromegaly.  Cardiovascular:     Rate and Rhythm: Normal rate and regular rhythm.     Pulses: Normal pulses.     Heart sounds: Normal heart sounds. No murmur heard. Pulmonary:     Effort: Pulmonary effort is normal. No respiratory distress.     Breath sounds: Normal breath sounds.  Abdominal:     General: There is no distension.     Palpations: Abdomen is soft.     Tenderness: There is no abdominal tenderness.  Musculoskeletal:     Cervical back: Normal range of motion and neck supple.     Right lower leg: No edema.     Left lower leg: No edema.  Lymphadenopathy:     Cervical: No cervical adenopathy.  Skin:    General: Skin is warm and dry.  Neurological:     General: No focal deficit present.     Mental Status: She is alert and oriented to person, place, and time.  Psychiatric:        Mood and Affect: Mood normal.        Behavior: Behavior normal.        Thought Content: Thought content normal.           Assessment & Plan:

## 2024-03-25 NOTE — Assessment & Plan Note (Signed)
 Chronic problem.  Currently asymptomatic.  On Jardiance , Glipizide , Metformin , and Januvia  w/ hx of good control.  UTD on eye exam, foot exam, microalbumin.  Check labs.  Adjust meds prn

## 2024-03-26 ENCOUNTER — Ambulatory Visit: Payer: Self-pay | Admitting: Family Medicine

## 2024-04-08 ENCOUNTER — Other Ambulatory Visit: Payer: Self-pay | Admitting: Family Medicine

## 2024-04-10 ENCOUNTER — Encounter: Payer: Self-pay | Admitting: Family Medicine

## 2024-04-10 NOTE — Telephone Encounter (Signed)
 Do you know if there is another location that the patient can go to? See message below.

## 2024-05-14 ENCOUNTER — Ambulatory Visit (HOSPITAL_BASED_OUTPATIENT_CLINIC_OR_DEPARTMENT_OTHER)
Admission: RE | Admit: 2024-05-14 | Discharge: 2024-05-14 | Disposition: A | Discharge status: Home / Self Care | Source: Ambulatory Visit | Attending: Family Medicine | Admitting: Family Medicine

## 2024-05-14 DIAGNOSIS — M8580 Other specified disorders of bone density and structure, unspecified site: Secondary | ICD-10-CM

## 2024-05-14 DIAGNOSIS — M8589 Other specified disorders of bone density and structure, multiple sites: Secondary | ICD-10-CM | POA: Diagnosis not present

## 2024-05-14 DIAGNOSIS — Z13820 Encounter for screening for osteoporosis: Secondary | ICD-10-CM | POA: Insufficient documentation

## 2024-05-14 DIAGNOSIS — M85851 Other specified disorders of bone density and structure, right thigh: Secondary | ICD-10-CM | POA: Diagnosis not present

## 2024-05-14 DIAGNOSIS — Z780 Asymptomatic menopausal state: Secondary | ICD-10-CM | POA: Diagnosis not present

## 2024-05-14 DIAGNOSIS — M85852 Other specified disorders of bone density and structure, left thigh: Secondary | ICD-10-CM | POA: Diagnosis not present

## 2024-05-15 NOTE — Progress Notes (Signed)
 Lab results have been discussed.   Verbalized understanding? Yes  Are there any questions? No

## 2024-06-06 ENCOUNTER — Other Ambulatory Visit: Payer: Self-pay | Admitting: Family Medicine

## 2024-06-07 ENCOUNTER — Other Ambulatory Visit: Payer: Self-pay | Admitting: Family Medicine

## 2024-06-07 DIAGNOSIS — I10 Essential (primary) hypertension: Secondary | ICD-10-CM

## 2024-06-08 ENCOUNTER — Other Ambulatory Visit: Payer: Self-pay | Admitting: Family Medicine

## 2024-06-09 ENCOUNTER — Other Ambulatory Visit: Payer: PPO | Admitting: Pharmacist

## 2024-06-09 NOTE — Progress Notes (Signed)
 06/09/2024 Name: Karen Gonzales MRN: [id number] DOB: 1946-07-17  No chief complaint on file.   Karen Gonzales is a 78 y.o. year old female who presented for a telephone visit.   They were referred to the pharmacist by for assistance in re-enrollment for 2026 medication assistance program.      Subjective:  Care Team: Primary Care Provider: Mahlon Comer BRAVO, MD ; Next Scheduled Visit: 06/25/2024 Cardiologist: Dr Lonni; Next Scheduled Visit: recall set for 01/19/2025  Medication Access/Adherence  Patient was enrolled in [facility] Cares medication assistance program in 2025 and received Jardiance .  She also was enrolled in [employer] medication assistance program in 202 and received Januvia .   She is interested in reapplying for 2026.   Patient also asked about GLP1 medication for diabetes. She has a family member that has been taking a GLP1 and has lost weight and gotten A1c / blood glucose to goal. Currently the cost of Ozempic or Mounjaro would be $47 / month which patient states is too much.    Current Pharmacy:  [facility] [phone] - [place], [place] - [address] [place] [address] Phone: [phone] Fax: [phone]  [facility] - [place], [place] - [address] [place] [address] Phone: [phone] Fax: [phone]   Patient reports affordability concerns with their medications: Yes  Patient reports access/transportation concerns to their pharmacy: No  Patient reports adherence concerns with their medications:  No      Objective:  Lab Results  Component Value Date   HGBA1C 7.7 (H) 03/25/2024    Lab Results  Component Value Date   CREATININE 0.90 03/25/2024   BUN 16 03/25/2024   NA 134 (L) 03/25/2024   K 4.4 03/25/2024   CL 102 03/25/2024   CO2 23 03/25/2024    Lab Results  Component Value Date   CHOL 113 12/24/2023   HDL 42.00 12/24/2023    LDLCALC 40 12/24/2023   LDLDIRECT 104.0 04/08/2017   TRIG 154.0 (H) 12/24/2023   CHOLHDL 3 12/24/2023    Medications Reviewed Today     Reviewed by Carla Milling, RPH-CPP (Pharmacist) on 06/09/24 at 0957  Med List Status: <None>   Medication Order Taking? Sig Documenting Provider Last Dose Status Informant  albuterol  (VENTOLIN  HFA) 108 (90 Base) MCG/ACT inhaler [id number]  TAKE 2 PUFFS BY MOUTH EVERY 6 HOURS AS NEEDED FOR WHEEZE OR SHORTNESS OF BREATH  Patient not taking: Reported on 06/09/2024   Tabori, Katherine E, MD  Active   amLODipine  (NORVASC ) 10 MG tablet [id number] Yes TAKE 1 TABLET BY MOUTH EVERY DAY Tabori, Katherine E, MD  Active   aspirin  EC 81 MG tablet [id number] Yes Take 1 tablet (81 mg total) by mouth daily. Swallow whole. Lonni Slain, MD  Active Self  Blood Glucose Monitoring Suppl (ONE TOUCH ULTRA 2) w/Device KIT [id number] Yes Pt uses glucometer to check sugars twice daily. Dx E11.9 Tabori, Katherine E, MD  Active Self  Calcium  Carbonate (CALCIUM  500 PO) [id number] Yes Take 1 tablet by mouth daily. [provider]  Active   empagliflozin  (JARDIANCE ) 25 MG TABS tablet [id number] Yes Take 1 tablet (25 mg total) by mouth daily. Mahlon Comer BRAVO, MD  Active            Med Note [facility], [facility]   Tue Sep 10, 2023  9:17 AM) Medication assistance program / [employer] C[employer] thru 08/12/2024  gabapentin  (NEURONTIN )  300 MG capsule [id number] Yes TAKE 2 CAPSULES BY MOUTH 3 TIMES DAILY. Tabori, Katherine E, MD  Active   glipiZIDE  (GLUCOTROL  XL) 10 MG 24 hr tablet [id number] Yes TAKE 1 TABLET (10 MG TOTAL) BY MOUTH DAILY WITH BREAKFAST. Tabori, Katherine E, MD  Active   Homeopathic Products (RESTFUL LEGS SL) [id number]  Place 2 tablets under the tongue daily as needed (restless legs). [provider]  Active Self           Med Note HERLENE EDSEL JONELLE Pablo Jan 20, 2024 11:00 AM)    Lancets JANETT [place] PLUS [place]) MISC [id number] Yes USE WHEN CHECKING BLOOD  SUGAR AS DIRECTED Tabori, Katherine E, MD  Active   losartan  (COZAAR ) 50 MG tablet [id number]  TAKE 2 TABLETS BY MOUTH EVERY DAY Tabori, Katherine E, MD  Active   Magnesium  250 MG TABS [id number] Yes Take 500 mg by mouth daily. [provider]  Active   meclizine  (ANTIVERT ) 25 MG tablet [id number]  Take 1 tablet (25 mg total) by mouth 3 (three) times daily as needed for dizziness. Tabori, Katherine E, MD  Active            Med Note (GONZALEZ, DANIELLE R   Mon Jan 20, 2024 10:59 AM)    meloxicam  (MOBIC ) 15 MG tablet [id number] Yes TAKE 1 TABLET BY MOUTH EVERY DAY WITH A MEAL Tabori, Katherine E, MD  Active   Menthol -Methyl Salicylate (MUSCLE RUB) 10-15 % CREA [id number]  Apply 1 Application topically as needed for muscle pain. [provider]  Active Self  metFORMIN  (GLUCOPHAGE ) 500 MG tablet [id number] Yes TAKE 2 TABLETS BY MOUTH 2 TIMES DAILY WITH A MEAL. Tabori, Katherine E, MD  Active   omeprazole  (PRILOSEC) 40 MG capsule [id number] Yes TAKE 1 CAPSULE BY MOUTH TWICE A DAY Tabori, Katherine E, MD  Active   [facility] ULTRA test strip [id number] Yes CHECK BLOOD SUGAR 1-2 TIMES DAILY Tabori, Katherine E, MD  Active   oxymetazoline (AFRIN) 0.05 % nasal spray [id number]  Place 1 spray into both nostrils 2 (two) times daily as needed for congestion. [provider]  Active Self  rosuvastatin  (CRESTOR ) 10 MG tablet [id number] Yes TAKE 1 TABLET BY MOUTH EVERY DAY Lonni Slain, MD  Active   sitaGLIPtin  (JANUVIA ) 100 MG tablet [id number] Yes Take 100 mg by mouth daily. [provider]  Active Self           Med Note JUSTINO, MADELIN NOVAK   Tue Sep 10, 2023  9:17 AM) Medication assistance program thru [employer] thru 08/12/2024  tiZANidine  (ZANAFLEX ) 4 MG tablet [id number] Yes TAKE 1 TABLET (4 MG TOTAL) BY MOUTH EVERY 8 (EIGHT) HOURS AS NEEDED FOR MUSCLE SPASMS Tabori, Katherine E, MD  Active    Patient not taking:   Discontinued 06/09/24 0948 (Completed Course)                Assessment/Plan:   Medication Access:  - Will coordinate with Medication Assistance Team to mail out application for Jardiance  / [employer] C[employer] and Januvia  / [employer] for 2026.  - Also discussed that companies that make GLP1's do not currently offer assistance program for Medicare patients for 2026. Her cost for Mounjaro for 2026 could be around $216 / month until she reached out of pocket max of $2100 around July 2026/  - Recommended patient call [employer] and ask about Heart an Diabete Care plan which should help to lower her cost for diabetes medications. Would make trying GLP1  an option. Patient to discuss trial of GLP1 with Dr Mahlon at upcoming appointment 06/2024.   Medication Adherence:  - Reviewed refill records. Past due to fill rosuvastatin  - LR 02/11/2024 for 90 DS. Patient reports she it taking daily and will request refill.   Follow up planned for November 2025.   Madelin Ray, PharmD Clinical Pharmacist [facility]  Population Health [phone]

## 2024-06-11 ENCOUNTER — Telehealth: Payer: Self-pay | Admitting: Family Medicine

## 2024-06-11 ENCOUNTER — Telehealth: Payer: Self-pay

## 2024-06-11 NOTE — Telephone Encounter (Signed)
 Type of form received: [employer] & [employer] PAP Application  Additional comments:   Received by: Fax  Form should be Faxed/mailed to: (address/ fax #) [phone]  Is patient requesting call for pickup: N/A  Form placed:  Labeled & placed in provider bin  Attach charge sheet.  Provider will determine charge.  Individual made aware of 3-5 business day turn around? N/A

## 2024-06-11 NOTE — Progress Notes (Signed)
 Karen Gonzales                                          MRN: [id number]   06/11/2024   The [facility] Quality Team Specialist reviewed this patient medical record for the purposes of chart review for care gap closure. The following were reviewed: chart review for care gap closure-controlling blood pressure.    [facility] Quality Team

## 2024-06-11 NOTE — Telephone Encounter (Signed)
 Filled and mail out pap [employer](Jarciance) and [employer](Januvia ) to pt,filled and faxed provider portion to sign and date.

## 2024-06-11 NOTE — Telephone Encounter (Signed)
PCP has signed.

## 2024-06-11 NOTE — Telephone Encounter (Signed)
 Faxed and placed in scan bin

## 2024-06-12 NOTE — Telephone Encounter (Signed)
 Received provider portion on [employer](Januvia ) and [employer](Jardiance ) waiting on pt portion

## 2024-06-16 DIAGNOSIS — M5416 Radiculopathy, lumbar region: Secondary | ICD-10-CM | POA: Diagnosis not present

## 2024-06-25 ENCOUNTER — Telehealth: Payer: Self-pay

## 2024-06-25 ENCOUNTER — Encounter: Payer: Self-pay | Admitting: Family Medicine

## 2024-06-25 ENCOUNTER — Ambulatory Visit: Payer: Self-pay | Admitting: Family Medicine

## 2024-06-25 ENCOUNTER — Other Ambulatory Visit (HOSPITAL_COMMUNITY): Payer: Self-pay

## 2024-06-25 ENCOUNTER — Ambulatory Visit: Admitting: Family Medicine

## 2024-06-25 VITALS — BP 144/58 | HR 71 | Temp 97.9°F | Resp 18 | Ht 65.0 in | Wt 181.4 lb

## 2024-06-25 DIAGNOSIS — E1140 Type 2 diabetes mellitus with diabetic neuropathy, unspecified: Secondary | ICD-10-CM | POA: Diagnosis not present

## 2024-06-25 DIAGNOSIS — Z7984 Long term (current) use of oral hypoglycemic drugs: Secondary | ICD-10-CM

## 2024-06-25 DIAGNOSIS — E1169 Type 2 diabetes mellitus with other specified complication: Secondary | ICD-10-CM | POA: Diagnosis not present

## 2024-06-25 DIAGNOSIS — D72829 Elevated white blood cell count, unspecified: Secondary | ICD-10-CM

## 2024-06-25 DIAGNOSIS — I10 Essential (primary) hypertension: Secondary | ICD-10-CM

## 2024-06-25 DIAGNOSIS — E785 Hyperlipidemia, unspecified: Secondary | ICD-10-CM

## 2024-06-25 DIAGNOSIS — R300 Dysuria: Secondary | ICD-10-CM | POA: Diagnosis not present

## 2024-06-25 LAB — HEPATIC FUNCTION PANEL
ALT: 33 U/L (ref 0–35)
AST: 30 U/L (ref 0–37)
Albumin: 4.7 g/dL (ref 3.5–5.2)
Alkaline Phosphatase: 55 U/L (ref 39–117)
Bilirubin, Direct: 0.1 mg/dL (ref 0.0–0.3)
Total Bilirubin: 0.6 mg/dL (ref 0.2–1.2)
Total Protein: 8 g/dL (ref 6.0–8.3)

## 2024-06-25 LAB — LIPID PANEL
Cholesterol: 133 mg/dL (ref 0–200)
HDL: 61.7 mg/dL (ref >39.00)
LDL Cholesterol: 44 mg/dL (ref 0–99)
NonHDL: 70.89
Total CHOL/HDL Ratio: 2
Triglycerides: 133 mg/dL (ref 0.0–149.0)
VLDL: 26.6 mg/dL (ref 0.0–40.0)

## 2024-06-25 LAB — BASIC METABOLIC PANEL WITH GFR
BUN: 25 mg/dL — ABNORMAL HIGH (ref 6–23)
CO2: 23 meq/L (ref 19–32)
Calcium: 10.5 mg/dL (ref 8.4–10.5)
Chloride: 97 meq/L (ref 96–112)
Creatinine, Ser: 1.09 mg/dL (ref 0.40–1.20)
GFR: 49.16 mL/min — ABNORMAL LOW (ref >60.00)
Glucose, Bld: 68 mg/dL — ABNORMAL LOW (ref 70–99)
Potassium: 4.8 meq/L (ref 3.5–5.1)
Sodium: 132 meq/L — ABNORMAL LOW (ref 135–145)

## 2024-06-25 LAB — CBC WITH DIFFERENTIAL/PLATELET
Basophils Absolute: 0.1 K/uL (ref 0.0–0.1)
Basophils Relative: 0.8 % (ref 0.0–3.0)
Eosinophils Absolute: 0.3 K/uL (ref 0.0–0.7)
Eosinophils Relative: 2.3 % (ref 0.0–5.0)
HCT: 36.7 % (ref 36.0–46.0)
Hemoglobin: 11.7 g/dL — ABNORMAL LOW (ref 12.0–15.0)
Lymphocytes Relative: 20 % (ref 12.0–46.0)
Lymphs Abs: 2.8 K/uL (ref 0.7–4.0)
MCHC: 32 g/dL (ref 30.0–36.0)
MCV: 81 fl (ref 78.0–100.0)
Monocytes Absolute: 1.2 K/uL — ABNORMAL HIGH (ref 0.1–1.0)
Monocytes Relative: 8.4 % (ref 3.0–12.0)
Neutro Abs: 9.4 K/uL — ABNORMAL HIGH (ref 1.4–7.7)
Neutrophils Relative %: 68.5 % (ref 43.0–77.0)
Platelets: 198 K/uL (ref 150.0–400.0)
RBC: 4.53 Mil/uL (ref 3.87–5.11)
RDW: 14.4 % (ref 11.5–15.5)
WBC: 13.8 K/uL — ABNORMAL HIGH (ref 4.0–10.5)

## 2024-06-25 LAB — TSH: TSH: 2.19 u[IU]/mL (ref 0.35–5.50)

## 2024-06-25 LAB — HEMOGLOBIN A1C: Hgb A1c MFr Bld: 7.3 % — ABNORMAL HIGH (ref 4.6–6.5)

## 2024-06-25 MED ORDER — TIRZEPATIDE 2.5 MG/0.5ML ~~LOC~~ SOAJ: 2.5000 mg | SUBCUTANEOUS | 0 refills | Status: DC

## 2024-06-25 MED ORDER — TIRZEPATIDE 5 MG/0.5ML ~~LOC~~ SOAJ: 5.0000 mg | SUBCUTANEOUS | 1 refills | Status: DC

## 2024-06-25 MED ORDER — CEPHALEXIN 500 MG PO CAPS: 500.0000 mg | ORAL_CAPSULE | Freq: Two times a day (BID) | ORAL | 0 refills | Status: DC

## 2024-06-25 NOTE — Telephone Encounter (Signed)
 Copied from CRM #[id number]. Topic: Clinical - Medication Prior Auth >> Jun 25, 2024 12:28 PM Charolett L wrote: Reason for CRM: Patient has stated that she needs prior auth for tirzepatide (MOUNJARO) 2.5 MG/0.5ML Pen

## 2024-06-25 NOTE — Progress Notes (Signed)
   Subjective:    Patient ID: Karen Gonzales, female    DOB: 04/30/1946, 78 y.o.   MRN: [id number]  HPI DM- chronic problem, on Jardiance  25mg  daily, Glipizide  10mg  daily, Metformin  1000mg  BID, Januvia  100mg  daily.  Last A1C 7.7%.  Due for foot exam, eye exam.  UTD on microalbumin.  Pt is interested in switching to Mounjaro  in hopes of stopping medication.  Is having recurrent UTI's since starting Jardiance .  HTN- chronic problem, on Amlodipine  10mg  daily, Losartan  100mg  daily.  BP mildly elevated today.  Denies CP, SOB, HA's, visual changes, edema.  Hyperlipidemia- chronic problem, on Crestor  10mg  daily.  Denies abd pain, N/V.  Dysuria- sxs started Monday night w/ painful urination.  Taking AZO.  + frequency, urgency.  No visible blood.   Review of Systems For ROS see HPI     Objective:   Physical Exam Vitals reviewed.  Constitutional:      General: She is not in acute distress.    Appearance: Normal appearance. She is well-developed. She is not ill-appearing.  HENT:     Head: Normocephalic and atraumatic.  Eyes:     Conjunctiva/sclera: Conjunctivae normal.     Pupils: Pupils are equal, round, and reactive to light.  Neck:     Thyroid : No thyromegaly.  Cardiovascular:     Rate and Rhythm: Normal rate and regular rhythm.     Heart sounds: Normal heart sounds. No murmur heard. Pulmonary:     Effort: Pulmonary effort is normal. No respiratory distress.     Breath sounds: Normal breath sounds.  Abdominal:     General: There is no distension.     Palpations: Abdomen is soft.     Tenderness: There is no abdominal tenderness.  Musculoskeletal:     Cervical back: Normal range of motion and neck supple.  Lymphadenopathy:     Cervical: No cervical adenopathy.  Skin:    General: Skin is warm and dry.  Neurological:     Mental Status: She is alert and oriented to person, place, and time.  Psychiatric:        Behavior: Behavior normal.           Assessment & Plan:   Dysuria- new.  Pt's sxs are consistent w/ UTI.  Start Keflex .  Stop Jardiance  as pt is having recurrent UTIs.  Pt expressed understanding and is in agreement w/ plan.

## 2024-06-25 NOTE — Telephone Encounter (Signed)
 Pharmacy Patient Advocate Encounter   Received notification from Onbase that prior authorization for Mounjaro 2.5MG /0.5ML auto-injectors is required/requested.   Insurance verification completed.   The patient is insured through [facility] ADVANTAGE/RX ADVANCE.   Per test claim: PA required; PA submitted to above mentioned insurance via Latent Key/confirmation #/EOC [id number] Status is pending

## 2024-06-25 NOTE — Patient Instructions (Signed)
 Follow up in 3 months to recheck sugar We'll notify you of your lab results and make any changes if needed START the Mounjaro 2.5mg  once weekly and after 4 doses, increase to 5mg  weekly STOP the Januvia  and the Jardiance  START the Cephalexin  twice daily for the UTI Drink LOTS of fluids Call with any questions or concerns Stay Safe!  Stay Healthy! Hang in there!! HAPPY HOLIDAYS!!!

## 2024-06-26 ENCOUNTER — Encounter: Payer: Self-pay | Admitting: Family Medicine

## 2024-06-26 ENCOUNTER — Other Ambulatory Visit (HOSPITAL_COMMUNITY): Payer: Self-pay

## 2024-06-26 NOTE — Telephone Encounter (Signed)
 Pharmacy Patient Advocate Encounter  Received notification from [facility] ADVANTAGE/RX ADVANCE that Prior Authorization for Mounjaro 2.5MG /0.5ML auto-injectors   has been APPROVED from 06/26/2024 to 06/26/2025. Ran test claim, Copay is $47.00. This test claim was processed through [facility]- copay amounts may vary at other pharmacies due to pharmacy/plan contracts, or as the patient moves through the different stages of their insurance plan.   PA #/Case ID/Reference #: [id number]

## 2024-06-26 NOTE — Telephone Encounter (Signed)
 Called patient in inform her of the approval, patient verbalized understanding

## 2024-06-27 LAB — URINE CULTURE
MICRO NUMBER:: 17231325
SPECIMEN QUALITY:: ADEQUATE

## 2024-06-29 MED ORDER — SULFAMETHOXAZOLE-TRIMETHOPRIM 800-160 MG PO TABS: 1.0000 | ORAL_TABLET | Freq: Two times a day (BID) | ORAL | 0 refills | Status: AC

## 2024-06-30 NOTE — Progress Notes (Signed)
 Patient verbalized understanding of labs and knows to start new medication

## 2024-07-01 LAB — OPHTHALMOLOGY REPORT-SCANNED

## 2024-07-02 ENCOUNTER — Other Ambulatory Visit: Payer: Self-pay | Admitting: Pharmacist

## 2024-07-02 DIAGNOSIS — E1140 Type 2 diabetes mellitus with diabetic neuropathy, unspecified: Secondary | ICD-10-CM

## 2024-07-02 DIAGNOSIS — M5416 Radiculopathy, lumbar region: Secondary | ICD-10-CM | POA: Diagnosis not present

## 2024-07-02 DIAGNOSIS — N1831 Chronic kidney disease, stage 3a: Secondary | ICD-10-CM

## 2024-07-02 NOTE — Progress Notes (Signed)
 07/02/2024 Name: Karen Gonzales MRN: [id number] DOB: 1945/12/16  Chief Complaint  Patient presents with   Medication Management   Diabetes    Karen Gonzales is a 78 y.o. year old female who presented for a telephone visit.   They were referred to the pharmacist by for medication management     Subjective:  Care Team: Primary Care Provider: Mahlon Comer BRAVO, MD ; Next Scheduled Visit: 09/25/2024 Cardiologist: Dr Lonni; Next Scheduled Visit: recall set for 01/19/2025  Medication Access/Adherence  Patient was enrolled in [facility] C[facility] medication assistance program in 2025 and received Jardiance .  She also was enrolled in [employer] medication assistance program in 202 and received Januvia .  Since our last phone visit both medications have been discontinued and she has started Mounjaro 2.5mg  weekly for 4 weeks and then will increase to 5mg  weekly.   Patient reports that she is planning to keep her same HTA plan for 2026 - Plan 1 Reviewed potential cost for [facility] on this plan. For 2026 she would have a $250 deductible to meet + 20% coinsurance for Mounjaro or any tier 3 medication. There is not medication assistance program for Mounjaro or other GLP1s.    Diabetes:  Current medications: Mounjaro 2.5mg  weekly for 4 weeks, then 5mg  weekly; metformin  500mg  - take 2 tablets twice a day, glipizide  ER 10mg  daily   Patient denies family history of thyroid  cancer.  Medications tried in the past: Jardiance  and Januvia  - stopped when Mounjaro started last week.   Current glucose readings: has only had 1 dose of Mounjaro so far  Reports blood glucose has been 103, 114 with 130's as highest reading.    Macrovascular and Microvascular Risk Reduction:  Statin? yes (rosuvastatin  10mg  daily ); ACEi/ARB? yes (losartna 50mg  - take 2 tablets daily - 100mg  / day) Last urinary albumin/creatinine ratio:  Lab Results  Component Value Date   MICRALBCREAT 58.9 (H) 12/24/2023   Last eye  exam: patient actually was seen by Dr Ginnie Pinal yesterday 07/01/2024  Lab Results  Component Value Date   HMDIABEYEEXA No Retinopathy 06/27/2023   Last foot exam: 06/25/2024 Tobacco Use:  Tobacco Use: Low Risk  (06/25/2024)   Patient History    Smoking Tobacco Use: Never    Smokeless Tobacco Use: Never    Passive Exposure: Not on file     Current Pharmacy:  [id number] - [place], [place] - [address] [employer] [place] [address] Phone: [phone] Fax: [phone]  [facility] - [place], [place] - [address] [place] [address] Phone: [phone] Fax: [phone]   Patient reports affordability concerns with their medications: Yes  Patient reports access/transportation concerns to their pharmacy: No  Patient reports adherence concerns with their medications:  No      Objective:  Lab Results  Component Value Date   HGBA1C 7.3 (H) 06/25/2024    Lab Results  Component Value Date   CREATININE 1.09 06/25/2024   BUN 25 (H) 06/25/2024   NA 132 (L) 06/25/2024   K 4.8 06/25/2024   CL 97 06/25/2024   CO2 23 06/25/2024    Lab Results  Component Value Date   CHOL 133 06/25/2024   HDL 61.70 06/25/2024   LDLCALC 44 06/25/2024   LDLDIRECT 104.0 04/08/2017   TRIG 133.0 06/25/2024   CHOLHDL 2 06/25/2024    Medications Reviewed Today     Reviewed by Carla,  Jeffery Bachmeier, RPH-CPP (Pharmacist) on 07/02/24 at 0946  Med List Status: <None>   Medication Order Taking? Sig Documenting Provider Last Dose Status Informant  albuterol  (VENTOLIN  HFA) 108 (90 Base) MCG/ACT inhaler [id number]  TAKE 2 PUFFS BY MOUTH EVERY 6 HOURS AS NEEDED FOR WHEEZE OR SHORTNESS OF BREATH  Patient not taking: Reported on 06/09/2024   Tabori, Katherine E, MD  Active   amLODipine  (NORVASC ) 10 MG tablet [id number] Yes TAKE 1 TABLET BY MOUTH EVERY DAY Tabori, Katherine E, MD  Active   aspirin  EC  81 MG tablet [id number] Yes Take 1 tablet (81 mg total) by mouth daily. Swallow whole. Lonni Slain, MD  Active Self  Blood Glucose Monitoring Suppl (ONE TOUCH ULTRA 2) w/Device KIT [id number] Yes Pt uses glucometer to check sugars twice daily. Dx E11.9 Tabori, Katherine E, MD  Active Self  Calcium  Carbonate (CALCIUM  500 PO) [id number] Yes Take 1 tablet by mouth daily. [provider]  Active   cephALEXin  (KEFLEX ) 500 MG capsule [id number]  Take 1 capsule (500 mg total) by mouth 2 (two) times daily. Tabori, Katherine E, MD  Active   gabapentin  (NEURONTIN ) 300 MG capsule [id number] Yes TAKE 2 CAPSULES BY MOUTH 3 TIMES DAILY. Tabori, Katherine E, MD  Active   glipiZIDE  (GLUCOTROL  XL) 10 MG 24 hr tablet [id number] Yes TAKE 1 TABLET (10 MG TOTAL) BY MOUTH DAILY WITH BREAKFAST. Tabori, Katherine E, MD  Active   Homeopathic Products (RESTFUL LEGS SL) [id number] Yes Place 2 tablets under the tongue daily as needed (restless legs). [provider]  Active Self           Med Note HERLENE EDSEL JONELLE Pablo Jan 20, 2024 11:00 AM)    Lancets JANETT [place] [facility]) MISC [id number] Yes USE WHEN CHECKING BLOOD SUGAR AS DIRECTED Tabori, Katherine E, MD  Active   losartan  (COZAAR ) 50 MG tablet [id number] Yes TAKE 2 TABLETS BY MOUTH EVERY DAY Tabori, Katherine E, MD  Active   Magnesium  250 MG TABS [id number] Yes Take 500 mg by mouth daily. [provider]  Active   meclizine  (ANTIVERT ) 25 MG tablet [id number]  Take 1 tablet (25 mg total) by mouth 3 (three) times daily as needed for dizziness. Tabori, Katherine E, MD  Active            Med Note (GONZALEZ, DANIELLE R   Mon Jan 20, 2024 10:59 AM)    meloxicam  (MOBIC ) 15 MG tablet [id number] Yes TAKE 1 TABLET BY MOUTH EVERY DAY WITH A MEAL Tabori, Katherine E, MD  Active   Menthol -Methyl Salicylate (MUSCLE RUB) 10-15 % CREA [id number] Yes Apply 1 Application topically as needed for muscle pain. [provider]  Active Self   metFORMIN  (GLUCOPHAGE ) 500 MG tablet [id number] Yes TAKE 2 TABLETS BY MOUTH 2 TIMES DAILY WITH A MEAL. Tabori, Katherine E, MD  Active   omeprazole  (PRILOSEC) 40 MG capsule [id number] Yes TAKE 1 CAPSULE BY MOUTH TWICE A DAY  Patient taking differently: Take 40 mg by mouth daily.   Mahlon Comer BRAVO, MD  Active   [facility] ULTRA test strip [id number] Yes CHECK BLOOD SUGAR 1-2 TIMES DAILY Tabori, Katherine E, MD  Active   oxymetazoline (AFRIN) 0.05 % nasal spray [id number] Yes Place 1 spray into both nostrils 2 (two) times daily as needed for congestion. [provider]  Active Self  rosuvastatin  (CRESTOR ) 10 MG tablet [id number] Yes TAKE 1 TABLET BY MOUTH EVERY DAY Lonni Slain, MD  Active   sulfamethoxazole -trimethoprim  (BACTRIM  DS)  800-160 MG tablet [id number] Yes Take 1 tablet by mouth 2 (two) times daily. Tabori, Katherine E, MD  Active   tirzepatide [facility]) 2.5 MG/0.5ML Pen [id number] Yes Inject 2.5 mg into the skin once a week. Tabori, Katherine E, MD  Active   tirzepatide [facility]) 5 MG/0.5ML Pen [id number]  Inject 5 mg into the skin once a week. Start AFTER you finish the 2.5mg  weekly dose  Patient not taking: Reported on 07/02/2024   Tabori, Katherine E, MD  Active   tiZANidine  (ZANAFLEX ) 4 MG tablet [id number] Yes TAKE 1 TABLET (4 MG TOTAL) BY MOUTH EVERY 8 (EIGHT) HOURS AS NEEDED FOR MUSCLE SPASMS Tabori, Katherine E, MD  Active               Assessment/Plan:   Medication Access:  - Per Medicare.gov site, with her current HTA Plan 1 her cost of Pat would be $297 for the first fill and around $218 / month (copayment is 20% of drug cost - could be a little lower depending on Medicare contact in future) until around July when she meets $2100 out of pocket max and then cost would be $0.  - Discussed Medicare payment plan that would strecch cost evenly over 12 months - estimate this would be about $175 / month.  - Again recommended patient call [employer] and ask about Heart an Diabete Care plan which should help to lower her cost for diabetes medications. Monthly cost of Mounjaro would be $0.    - Recommend recheck UACR in 3 to 6 months - if UACR stll > 30 consider adding back Jardiance  for renal benefits and stop glipizide .  - Discussed omeprazole  dose. She is only taking once daily. Recommended if taking only once daily in controlling her reflux, then to just take once a day due to potential but low risk of affecting renal function and calcium  absorption / bone health.    Follow up planned for next week to see which HTA plan she chose and discuss cost.   Madelin Ray, PharmD Clinical Pharmacist [facility] Primary [facility]  Population Health [phone]

## 2024-07-07 ENCOUNTER — Other Ambulatory Visit: Payer: Self-pay | Admitting: Pharmacist

## 2024-07-08 ENCOUNTER — Other Ambulatory Visit: Payer: Self-pay | Admitting: Family Medicine

## 2024-07-08 NOTE — Telephone Encounter (Signed)
 Requested Prescriptions   Pending Prescriptions Disp Refills   tiZANidine  (ZANAFLEX ) 4 MG tablet [Pharmacy Med Name: TIZANIDINE  HCL 4 MG TABLET] 30 tablet 3    Sig: TAKE 1 TABLET (4 MG TOTAL) BY MOUTH EVERY 8 (EIGHT) HOURS AS NEEDED FOR MUSCLE SPASMS     Date of patient request: 07/08/24 Last office visit: 06/25/2024 Upcoming visit: 09/25/2024 Date of last refill: 03/11/24 Last refill amount: 30 w/ 3 refills

## 2024-07-12 NOTE — Assessment & Plan Note (Signed)
 Chronic problem.  Currently on Jardiance , Glipizide , Metformin , and Januvia .  Foot exam done today.  Pt to schedule eye exam.  Pt would like to start Mounjaro  in hopes of stopping some of her other medication- particularly Jardiance  due to recurrent UTIs.  Stop Januvia  and Jardiance .  Start Mounjaro  and titrate prn.  Pt expressed understanding and is in agreement w/ plan.

## 2024-07-12 NOTE — Assessment & Plan Note (Signed)
 Chronic problem.  Mildly elevated today but not out of range.  Currently on Amlodipine  10mg  daily, Losartan  100mg .  Asymptomatic.  Will hold on med changes at this time but will continue to monitor closely.  Check labs due to ARB but no anticipated med changes.

## 2024-07-12 NOTE — Assessment & Plan Note (Signed)
 Chronic problem.  On Crestor  10mg  daily w/o difficulty.  Check labs.  Adjust meds prn

## 2024-07-13 ENCOUNTER — Other Ambulatory Visit (INDEPENDENT_AMBULATORY_CARE_PROVIDER_SITE_OTHER)

## 2024-07-13 DIAGNOSIS — D72829 Elevated white blood cell count, unspecified: Secondary | ICD-10-CM

## 2024-07-13 LAB — CBC WITH DIFFERENTIAL/PLATELET
Basophils Absolute: 0.1 K/uL (ref 0.0–0.1)
Basophils Relative: 1.3 % (ref 0.0–3.0)
Eosinophils Absolute: 0.2 K/uL (ref 0.0–0.7)
Eosinophils Relative: 2.1 % (ref 0.0–5.0)
HCT: 33.3 % — ABNORMAL LOW (ref 36.0–46.0)
Hemoglobin: 11.1 g/dL — ABNORMAL LOW (ref 12.0–15.0)
Lymphocytes Relative: 39.6 % (ref 12.0–46.0)
Lymphs Abs: 2.9 K/uL (ref 0.7–4.0)
MCHC: 33.3 g/dL (ref 30.0–36.0)
MCV: 80.5 fl (ref 78.0–100.0)
Monocytes Absolute: 0.8 K/uL (ref 0.1–1.0)
Monocytes Relative: 11 % (ref 3.0–12.0)
Neutro Abs: 3.3 K/uL (ref 1.4–7.7)
Neutrophils Relative %: 46 % (ref 43.0–77.0)
Platelets: 199 K/uL (ref 150.0–400.0)
RBC: 4.13 Mil/uL (ref 3.87–5.11)
RDW: 15.1 % (ref 11.5–15.5)
WBC: 7.2 K/uL (ref 4.0–10.5)

## 2024-07-14 ENCOUNTER — Ambulatory Visit: Payer: Self-pay | Admitting: Family Medicine

## 2024-07-17 ENCOUNTER — Telehealth: Payer: Self-pay | Admitting: Family Medicine

## 2024-07-17 NOTE — Telephone Encounter (Signed)
 Type of form received: Chronic Condition Verification (HMO C-SNP)  Additional comments:   Received by: Fax  Form should be Faxed/mailed to: (address/ fax #) [phone]  Is patient requesting call for pickup: N/A  Form placed:  Labeled & placed in provider bin  Attach charge sheet.  Provider will determine charge.  Individual made aware of 3-5 business day turn around? N/A

## 2024-07-17 NOTE — Telephone Encounter (Signed)
 Obtained and placed in providers folder for review

## 2024-07-20 NOTE — Telephone Encounter (Signed)
 Form completed and returned to [place]

## 2024-07-21 NOTE — Telephone Encounter (Signed)
 Faxed and placed in scan bin

## 2024-07-24 ENCOUNTER — Encounter: Payer: Self-pay | Admitting: Pharmacist

## 2024-07-24 NOTE — Progress Notes (Signed)
 Pharmacy Quality Measure Review  This patient is appearing on a report for being at risk of failing the adherence measure for cholesterol (statin) medications this calendar year.   Medication: rosuvastatin   Last fill date: 02/19/2024 for 90 day supply per adherence report.   Reviewed recent refill history in Dr Annemarie database. Actual last refill date was 06/30/2024 for 90 day supply. Patient has 3 refills remaining. Next appointment with PCP is 09/25/2024.   Insurance report was not up to date. No action needed at this time.   Madelin Ray, PharmD Clinical Pharmacist [facility]  Population Health [phone]

## 2024-07-28 ENCOUNTER — Encounter: Payer: Self-pay | Admitting: Neurology

## 2024-07-30 ENCOUNTER — Other Ambulatory Visit: Payer: Self-pay | Admitting: Family Medicine

## 2024-08-05 ENCOUNTER — Other Ambulatory Visit: Payer: Self-pay | Admitting: Family Medicine

## 2024-08-05 NOTE — Telephone Encounter (Signed)
 Requested Prescriptions   Pending Prescriptions Disp Refills   meloxicam  (MOBIC ) 15 MG tablet [Pharmacy Med Name: MELOXICAM  15 MG TABLET] 30 tablet 1    Sig: TAKE 1 TABLET BY MOUTH EVERY DAY WITH A MEAL     Date of patient request: 08/05/24 Last office visit: 06/25/2024 Upcoming visit: 09/25/2024 Date of last refill: 06/08/24 Last refill amount: 30 w/ 1 refill

## 2024-08-15 ENCOUNTER — Other Ambulatory Visit: Payer: Self-pay | Admitting: Family Medicine

## 2024-08-17 ENCOUNTER — Ambulatory Visit: Admitting: Family Medicine

## 2024-08-17 ENCOUNTER — Encounter: Payer: Self-pay | Admitting: Family Medicine

## 2024-08-17 VITALS — BP 120/50 | HR 78 | Wt 172.0 lb

## 2024-08-17 DIAGNOSIS — E1140 Type 2 diabetes mellitus with diabetic neuropathy, unspecified: Secondary | ICD-10-CM | POA: Diagnosis not present

## 2024-08-17 DIAGNOSIS — N342 Other urethritis: Secondary | ICD-10-CM

## 2024-08-17 LAB — POCT URINALYSIS DIPSTICK
Glucose, UA: NEGATIVE
Nitrite, UA: POSITIVE
Protein, UA: POSITIVE — AB
Spec Grav, UA: 1.015
Urobilinogen, UA: >=8 U/dL — AB
pH, UA: 5

## 2024-08-17 MED ORDER — CEPHALEXIN 500 MG PO CAPS: 500.0000 mg | ORAL_CAPSULE | Freq: Two times a day (BID) | ORAL | 0 refills | Status: DC

## 2024-08-17 NOTE — Patient Instructions (Addendum)
 Follow up as needed or as scheduled START the Cephalexin  twice daily x5 days INCREASE the Gabapentin  to 3 pills nightly (continue 2 pills during the day) until your next appt Call with any questions or concerns Stay Safe!  Stay Healthy! Hang in there!!!

## 2024-08-17 NOTE — Progress Notes (Signed)
" ° °  Subjective:    Patient ID: Karen Gonzales, female    DOB: 07-Feb-1946, 79 y.o.   MRN: [id number]  HPI UTI- sxs started Saturday morning w/ burning, frequency, urgency.  Taking AZO for relief.  No fever, N/V.    L foot/ankle pain- saw Dr Harden and reports joint is ok and that pain sounds nerve related.  Has nerve study scheduled for March.  Reports she will have burning, stabbing pain.  On Gabapentin  600mg  TID.   Review of Systems For ROS see HPI     Objective:   Physical Exam Vitals reviewed.  Constitutional:      General: She is not in acute distress.    Appearance: Normal appearance. She is well-developed. She is not ill-appearing.  Abdominal:     General: There is no distension.     Palpations: Abdomen is soft.     Tenderness: There is no abdominal tenderness (no suprapubic or CVA tenderness).  Skin:    General: Skin is warm and dry.  Neurological:     General: No focal deficit present.     Mental Status: She is alert and oriented to person, place, and time.  Psychiatric:        Mood and Affect: Mood normal.        Behavior: Behavior normal.        Thought Content: Thought content normal.           Assessment & Plan:  UTI- pt's sxs and UA consistent w/ infxn.  Start Keflex  BID while waiting on cx results.  Pt expressed understanding and is in agreement w/ plan.   "

## 2024-08-18 LAB — URINE CULTURE
MICRO NUMBER:: 17425907
Result:: NO GROWTH
SPECIMEN QUALITY:: ADEQUATE

## 2024-08-19 ENCOUNTER — Ambulatory Visit: Payer: Self-pay | Admitting: Family Medicine

## 2024-08-19 ENCOUNTER — Other Ambulatory Visit: Payer: Self-pay | Admitting: Family Medicine

## 2024-08-19 DIAGNOSIS — E1140 Type 2 diabetes mellitus with diabetic neuropathy, unspecified: Secondary | ICD-10-CM

## 2024-08-20 NOTE — Progress Notes (Signed)
 Karen Gonzales                                          MRN: [id number]   08/20/2024   The [facility] Quality Team Specialist reviewed this patient medical record for the purposes of chart review for care gap closure. The following were reviewed: chart review for care gap closure-controlling blood pressure.    [facility] Quality Team

## 2024-08-31 ENCOUNTER — Ambulatory Visit

## 2024-08-31 ENCOUNTER — Ambulatory Visit: Admitting: Podiatry

## 2024-08-31 DIAGNOSIS — M7672 Peroneal tendinitis, left leg: Secondary | ICD-10-CM | POA: Diagnosis not present

## 2024-08-31 DIAGNOSIS — M7752 Other enthesopathy of left foot: Secondary | ICD-10-CM | POA: Diagnosis not present

## 2024-08-31 DIAGNOSIS — M79672 Pain in left foot: Secondary | ICD-10-CM

## 2024-08-31 DIAGNOSIS — M7662 Achilles tendinitis, left leg: Secondary | ICD-10-CM | POA: Diagnosis not present

## 2024-08-31 MED ORDER — TRIAMCINOLONE ACETONIDE 10 MG/ML IJ SUSP
5.0000 mg | Freq: Once | INTRAMUSCULAR | Status: AC
  Administered 2024-08-31: 5 mg via INTRA_ARTICULAR

## 2024-08-31 NOTE — Progress Notes (Unsigned)
 Pt referral Sin tari sinjj

## 2024-08-31 NOTE — Patient Instructions (Addendum)
While at your visit today you received a steroid injection in your foot or ankle to help with your pain. Along with having the steroid medication there is some "numbing" medication in the shot that you received. Due to this you may notice some numbness to the area for the next couple of hours.   I would recommend limiting activity for the next few days to help the steroid injection take affect.    The actually benefit from the steroid injection may take up to 2-7 days to see a difference. You may actually experience a small (as in 10%) INCREASE in pain in the first 24 hours---that is common. It would be best if you can ice the area today and take anti-inflammatory medications (such as Ibuprofen, Motrin, or Aleve) if you are able to take these medications. If you were prescribed another medication to help with the pain go ahead and start that medication today    Things to watch out for that you should contact us or a health care provider urgently would include: 1. Unusual (as in more than 10%) increase in pain 2. New fever > 101.5 3. New swelling or redness of the injected area.  4. Streaking of red lines around the area injected.  If you have any questions or concerns about this, please give our office a call at [phone].   ---   Achilles Tendinitis  with Rehab Achilles tendinitis is a disorder of the Achilles tendon. The Achilles tendon connects the large calf muscles (Gastrocnemius and Soleus) to the heel bone (calcaneus). This tendon is sometimes called the heel cord. It is important for pushing-off and standing on your toes and is important for walking, running, or jumping. Tendinitis is often caused by overuse and repetitive microtrauma. SYMPTOMS Pain, tenderness, swelling, warmth, and redness may occur over the Achilles tendon even at rest. Pain with pushing off, or flexing or extending the ankle. Pain that is worsened after or during activity. CAUSES  Overuse sometimes seen with  rapid increase in exercise programs or in sports requiring running and jumping. Poor physical conditioning (strength and flexibility or endurance). Running sports, especially training running down hills. Inadequate warm-up before practice or play or failure to stretch before participation. Injury to the tendon. PREVENTION  Warm up and stretch before practice or competition. Allow time for adequate rest and recovery between practices and competition. Keep up conditioning. Keep up ankle and leg flexibility. Improve or keep muscle strength and endurance. Improve cardiovascular fitness. Use proper technique. Use proper equipment (shoes, skates). To help prevent recurrence, taping, protective strapping, or an adhesive bandage may be recommended for several weeks after healing is complete. PROGNOSIS  Recovery may take weeks to several months to heal. Longer recovery is expected if symptoms have been prolonged. Recovery is usually quicker if the inflammation is due to a direct blow as compared with overuse or sudden strain. RELATED COMPLICATIONS  Healing time will be prolonged if the condition is not correctly treated. The injury must be given plenty of time to heal. Symptoms can reoccur if activity is resumed too soon. Untreated, tendinitis may increase the risk of tendon rupture requiring additional time for recovery and possibly surgery. TREATMENT  The first treatment consists of rest anti-inflammatory medication, and ice to relieve the pain. Stretching and strengthening exercises after resolution of pain will likely help reduce the risk of recurrence. Referral to a physical therapist or athletic trainer for further evaluation and treatment may be helpful. A walking boot or cast may  be recommended to rest the Achilles tendon. This can help break the cycle of inflammation and microtrauma. Arch supports (orthotics) may be prescribed or recommended by your caregiver as an adjunct to therapy and  rest. Surgery to remove the inflamed tendon lining or degenerated tendon tissue is rarely necessary and has shown less than predictable results. MEDICATION  Nonsteroidal anti-inflammatory medications, such as aspirin and ibuprofen, may be used for pain and inflammation relief. Do not take within 7 days before surgery. Take these as directed by your caregiver. Contact your caregiver immediately if any bleeding, stomach upset, or signs of allergic reaction occur. Other minor pain relievers, such as acetaminophen, may also be used. Pain relievers may be prescribed as necessary by your caregiver. Do not take prescription pain medication for longer than 4 to 7 days. Use only as directed and only as much as you need. Cortisone injections are rarely indicated. Cortisone injections may weaken tendons and predispose to rupture. It is better to give the condition more time to heal than to use them. HEAT AND COLD Cold is used to relieve pain and reduce inflammation for acute and chronic Achilles tendinitis. Cold should be applied for 10 to 15 minutes every 2 to 3 hours for inflammation and pain and immediately after any activity that aggravates your symptoms. Use ice packs or an ice massage. Heat may be used before performing stretching and strengthening activities prescribed by your caregiver. Use a heat pack or a warm soak. SEEK MEDICAL CARE IF: Symptoms get worse or do not improve in 2 weeks despite treatment. New, unexplained symptoms develop. Drugs used in treatment may produce side effects.  EXERCISES:  RANGE OF MOTION (ROM) AND STRETCHING EXERCISES - Achilles Tendinitis  These exercises may help you when beginning to rehabilitate your injury. Your symptoms may resolve with or without further involvement from your physician, physical therapist or athletic trainer. While completing these exercises, remember:  Restoring tissue flexibility helps normal motion to return to the joints. This allows healthier,  less painful movement and activity. An effective stretch should be held for at least 30 seconds. A stretch should never be painful. You should only feel a gentle lengthening or release in the stretched tissue.  STRETCH  Gastroc, Standing  Place hands on wall. Extend right / left leg, keeping the front knee somewhat bent. Slightly point your toes inward on your back foot. Keeping your right / left heel on the floor and your knee straight, shift your weight toward the wall, not allowing your back to arch. You should feel a gentle stretch in the right / left calf. Hold this position for 10 seconds. Repeat 3 times. Complete this stretch 2 times per day.  STRETCH  Soleus, Standing  Place hands on wall. Extend right / left leg, keeping the other knee somewhat bent. Slightly point your toes inward on your back foot. Keep your right / left heel on the floor, bend your back knee, and slightly shift your weight over the back leg so that you feel a gentle stretch deep in your back calf. Hold this position for 10 seconds. Repeat 3 times. Complete this stretch 2 times per day.  STRETCH  Gastrocsoleus, Standing  Note: This exercise can place a lot of stress on your foot and ankle. Please complete this exercise only if specifically instructed by your caregiver.  Place the ball of your right / left foot on a step, keeping your other foot firmly on the same step. Hold on to the wall  or a rail for balance. Slowly lift your other foot, allowing your body weight to press your heel down over the edge of the step. You should feel a stretch in your right / left calf. Hold this position for 10 seconds. Repeat this exercise with a slight bend in your knee. Repeat 3 times. Complete this stretch 2 times per day.   STRENGTHENING EXERCISES - Achilles Tendinitis These exercises may help you when beginning to rehabilitate your injury. They may resolve your symptoms with or without further involvement from your  physician, physical therapist or athletic trainer. While completing these exercises, remember:  Muscles can gain both the endurance and the strength needed for everyday activities through controlled exercises. Complete these exercises as instructed by your physician, physical therapist or athletic trainer. Progress the resistance and repetitions only as guided. You may experience muscle soreness or fatigue, but the pain or discomfort you are trying to eliminate should never worsen during these exercises. If this pain does worsen, stop and make certain you are following the directions exactly. If the pain is still present after adjustments, discontinue the exercise until you can discuss the trouble with your clinician.  STRENGTH - Plantar-flexors  Sit with your right / left leg extended. Holding onto both ends of a rubber exercise band/tubing, loop it around the ball of your foot. Keep a slight tension in the band. Slowly push your toes away from you, pointing them downward. Hold this position for 10 seconds. Return slowly, controlling the tension in the band/tubing. Repeat 3 times. Complete this exercise 2 times per day.   STRENGTH - Plantar-flexors  Stand with your feet shoulder width apart. Steady yourself with a wall or table using as little support as needed. Keeping your weight evenly spread over the width of your feet, rise up on your toes.* Hold this position for 10 seconds. Repeat 3 times. Complete this exercise 2 times per day.  *If this is too easy, shift your weight toward your right / left leg until you feel challenged. Ultimately, you may be asked to do this exercise with your right / left foot only.  STRENGTH  Plantar-flexors, Eccentric  Note: This exercise can place a lot of stress on your foot and ankle. Please complete this exercise only if specifically instructed by your caregiver.  Place the balls of your feet on a step. With your hands, use only enough support from a wall or  rail to keep your balance. Keep your knees straight and rise up on your toes. Slowly shift your weight entirely to your right / left toes and pick up your opposite foot. Gently and with controlled movement, lower your weight through your right / left foot so that your heel drops below the level of the step. You will feel a slight stretch in the back of your calf at the end position. Use the healthy leg to help rise up onto the balls of both feet, then lower weight only on the right / left leg again. Build up to 15 repetitions. Then progress to 3 consecutive sets of 15 repetitions.* After completing the above exercise, complete the same exercise with a slight knee bend (about 30 degrees). Again, build up to 15 repetitions. Then progress to 3 consecutive sets of 15 repetitions.* Perform this exercise 2 times per day.  *When you easily complete 3 sets of 15, your physician, physical therapist or athletic trainer may advise you to add resistance by wearing a backpack filled with additional weight.  STRENGTH -  Plantar Flexors, Seated  Sit on a chair that allows your feet to rest flat on the ground. If necessary, sit at the edge of the chair. Keeping your toes firmly on the ground, lift your right / left heel as far as you can without increasing any discomfort in your ankle. Repeat 3 times. Complete this exercise 2 times a day.

## 2024-09-01 ENCOUNTER — Other Ambulatory Visit: Payer: Self-pay | Admitting: Family Medicine

## 2024-09-07 NOTE — Telephone Encounter (Signed)
 Gave pt a call to follow up on mail out PAP [employer]ica[employer](Jardiance ) and [employer],(Januvia )spoke with pt explain both of her med are going to be covered under her Ins plan pt does not need assistance at this time.

## 2024-09-12 NOTE — Assessment & Plan Note (Signed)
 Deteriorated.  Pt has worsening neuropathy.  Has nerve conduction study scheduled for March.  Will increase Gabapentin  to 900mg  nightly and continue 600mg  during the day to see if sxs improve.  Pt expressed understanding and is in agreement w/ plan.

## 2024-09-15 ENCOUNTER — Other Ambulatory Visit: Payer: Self-pay

## 2024-09-15 MED ORDER — TIRZEPATIDE 5 MG/0.5ML ~~LOC~~ SOAJ: 5.0000 mg | SUBCUTANEOUS | 1 refills | Status: AC

## 2024-09-16 ENCOUNTER — Ambulatory Visit: Admitting: Family Medicine

## 2024-09-16 ENCOUNTER — Encounter: Payer: Self-pay | Admitting: Family Medicine

## 2024-09-16 VITALS — BP 134/54 | HR 86 | Temp 97.9°F | Ht 65.0 in | Wt 171.0 lb

## 2024-09-16 DIAGNOSIS — R350 Frequency of micturition: Secondary | ICD-10-CM

## 2024-09-16 DIAGNOSIS — R300 Dysuria: Secondary | ICD-10-CM

## 2024-09-16 DIAGNOSIS — N390 Urinary tract infection, site not specified: Secondary | ICD-10-CM

## 2024-09-16 MED ORDER — CEPHALEXIN 500 MG PO CAPS: 500.0000 mg | ORAL_CAPSULE | Freq: Two times a day (BID) | ORAL | 0 refills | Status: AC

## 2024-09-16 NOTE — Progress Notes (Signed)
" ° °  Subjective:    Patient ID: Karen Gonzales, female    DOB: 1946/01/06, 79 y.o.   MRN: [id number]  HPI Frequency/dysuria- pt is having issues w/ recurrent urinary sxs.  Sxs started Monday afternoon.  No fever, N/V.  Taking AZO for sxs.  Thought sxs would improve after stopping Jardiance  but continues to have sxs.   Review of Systems For ROS see HPI     Objective:   Physical Exam Vitals reviewed.  Constitutional:      General: She is not in acute distress.    Appearance: Normal appearance. She is well-developed. She is not ill-appearing.  Abdominal:     General: There is no distension.     Palpations: Abdomen is soft.     Tenderness: There is abdominal tenderness (mild suprapubic TTP). There is no right CVA tenderness or left CVA tenderness.  Neurological:     Mental Status: She is alert.           Assessment & Plan:  Urinary frequency and dysuria- new.  Sxs started 2 days ago.  Currently on AZO so UA is not valid.  Will start tx based on sxs and send for culture.  As pt continues to have urinary issues despite stopping Jardiance  will refer to Urology for evaluation.  Pt expressed understanding and is in agreement w/ plan.   "

## 2024-09-16 NOTE — Patient Instructions (Addendum)
 Follow up as needed or as scheduled START the Cephalexin  twice daily Drink LOTS of fluids We'll call you to schedule your Urology appt Call with any questions or concerns Stay Safe!  Stay Healthy! Happy Valentine's Day!!!

## 2024-09-17 LAB — URINE CULTURE
MICRO NUMBER:: 17548195
SPECIMEN QUALITY:: ADEQUATE

## 2024-09-18 ENCOUNTER — Ambulatory Visit: Payer: Self-pay | Admitting: Family Medicine

## 2024-09-18 NOTE — Progress Notes (Signed)
 Lab results have been discussed.   Verbalized understanding? Yes  Are there any questions? No   Patient reported that she is feeling better

## 2024-09-25 ENCOUNTER — Ambulatory Visit: Admitting: Family Medicine

## 2024-09-28 ENCOUNTER — Ambulatory Visit: Admitting: Podiatry

## 2024-11-02 ENCOUNTER — Ambulatory Visit: Payer: Self-pay | Admitting: Neurology

## 2024-12-24 ENCOUNTER — Encounter: Admitting: Family Medicine

## 2024-12-29 ENCOUNTER — Ambulatory Visit
